# Patient Record
Sex: Male | Born: 1940
Health system: Southern US, Community
[De-identification: ages and names within clinical notes are randomized; demographics above are authoritative.]

## PROBLEM LIST (undated history)

## (undated) DIAGNOSIS — C61 Malignant neoplasm of prostate: Secondary | ICD-10-CM

## (undated) DIAGNOSIS — R221 Localized swelling, mass and lump, neck: Secondary | ICD-10-CM

## (undated) DIAGNOSIS — C801 Malignant (primary) neoplasm, unspecified: Secondary | ICD-10-CM

## (undated) DIAGNOSIS — Z923 Personal history of irradiation: Secondary | ICD-10-CM

## (undated) DIAGNOSIS — D649 Anemia, unspecified: Secondary | ICD-10-CM

## (undated) DIAGNOSIS — I729 Aneurysm of unspecified site: Secondary | ICD-10-CM

## (undated) DIAGNOSIS — J189 Pneumonia, unspecified organism: Secondary | ICD-10-CM

## (undated) DIAGNOSIS — N1330 Unspecified hydronephrosis: Secondary | ICD-10-CM

## (undated) DIAGNOSIS — R339 Retention of urine, unspecified: Secondary | ICD-10-CM

---

## 2015-07-05 ENCOUNTER — Ambulatory Visit (INDEPENDENT_AMBULATORY_CARE_PROVIDER_SITE_OTHER): Payer: Medicare HMO | Admitting: Family Medicine

## 2015-07-05 VITALS — BP 132/96 | HR 74 | Temp 97.7°F | Resp 16 | Wt 140.6 lb

## 2015-07-05 DIAGNOSIS — R634 Abnormal weight loss: Secondary | ICD-10-CM | POA: Diagnosis not present

## 2015-07-05 DIAGNOSIS — D649 Anemia, unspecified: Secondary | ICD-10-CM | POA: Diagnosis not present

## 2015-07-05 DIAGNOSIS — R630 Anorexia: Secondary | ICD-10-CM | POA: Diagnosis not present

## 2015-07-05 LAB — POCT URINALYSIS DIP (MANUAL ENTRY)
Bilirubin, UA: NEGATIVE
Glucose, UA: NEGATIVE
Ketones, POC UA: NEGATIVE
Leukocytes, UA: NEGATIVE
Nitrite, UA: NEGATIVE
Spec Grav, UA: <=1.005
Urobilinogen, UA: 0.2
pH, UA: 5.5

## 2015-07-05 LAB — POCT CBC
Granulocyte percent: 62.6 %G (ref 37–80)
HCT, POC: 24 % — AB (ref 43.5–53.7)
Hemoglobin: 8.4 g/dL — AB (ref 14.1–18.1)
Lymph, poc: 1.3 (ref 0.6–3.4)
MCH, POC: 28.7 pg (ref 27–31.2)
MCHC: 35 g/dL (ref 31.8–35.4)
MCV: 81.9 fL (ref 80–97)
MID (cbc): 0.2 (ref 0–0.9)
MPV: 6.1 fL (ref 0–99.8)
POC Granulocyte: 2.4 (ref 2–6.9)
POC LYMPH PERCENT: 32.4 %L (ref 10–50)
POC MID %: 5 %M (ref 0–12)
Platelet Count, POC: 218 10*3/uL (ref 142–424)
RBC: 2.93 M/uL — AB (ref 4.69–6.13)
RDW, POC: 14 %
WBC: 3.9 10*3/uL — AB (ref 4.6–10.2)

## 2015-07-05 LAB — GLUCOSE, POCT (MANUAL RESULT ENTRY): POC Glucose: 110 mg/dl — AB (ref 70–99)

## 2015-07-05 LAB — POCT GLYCOSYLATED HEMOGLOBIN (HGB A1C): Hemoglobin A1C: 6.3

## 2015-07-05 NOTE — Progress Notes (Addendum)
By signing my name below, I, Joel [name], attest that this documentation has been prepared under the direction and in the presence of Joel Haber, MD. Electronically Signed: [name], [place]. 07/05/2015 , 5:19 PM .  Patient was seen in room 14 .   Patient ID: Joel [name] MRN: [id number], DOB: [birth year], 75 y.o. Date of Encounter: 07/05/2015  Primary Physician: No primary care provider on file.  Chief Complaint:  Chief Complaint  Patient presents with  . Leg Swelling    both legs, on going for x 2 months  . loss of appeptite    HPI:  Joel [name] is a 75 y.o. male who presents to [facility] complaining of leg swelling bilaterally that started about 2 months ago. He states that the swelling goes from his ankle up to his knee. He also mentions loss of appetite due to abdominal discomfort. He notes frequent urination and recent weight loss. He denies taking any chronic medications. He denies cough, shortness of breath, vision changes or epistaxis. He's a former smoker, quit about 30 years ago.   He also reports possible hernia from lifting at the [employer].   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   History reviewed. No pertinent past medical history.   Home Meds: Prior to Admission medications   Not on File    Allergies: No Known Allergies  Social History   Social History  . Marital Status: Married    Spouse Name: N/A  . Number of Children: N/A  . Years of Education: N/A   Occupational History  . Not on file.   Social History Main Topics  . Smoking status: Never Smoker   . Smokeless tobacco: Not on file  . Alcohol Use: No  . Drug Use: Not on file  . Sexual Activity: Not on file   Other Topics Concern  . Not on file   Social History Narrative  . No narrative on file     Review of Systems:  Constitutional: negative for fever, chills, night sweats, or fatigue; positive for loss of appetite, weight change HEENT:  negative for vision changes, hearing loss, congestion, rhinorrhea, ST, epistaxis, or sinus pressure Cardiovascular: negative for chest pain or palpitations Respiratory: negative for hemoptysis, wheezing, shortness of breath, or cough Abdominal: negative for nausea, vomiting, diarrhea, or constipation; positive for abdominal discomfort Dermatological: negative for rash Musc: positive for leg swelling (bilaterally) GU: positive for frequent urination Neurologic: negative for headache, dizziness, or syncope All other systems reviewed and are otherwise negative with the exception to those above and in the HPI.  Physical Exam: [name] pressure 132/96, pulse 74, temperature 97.7 F (36.5 C), temperature source Oral, resp. rate 16, weight 140 lb 9.6 oz (63.776 kg), SpO2 97 %., There is no height on file to calculate BMI. General: Well developed in no acute distress; cachetic  Head: Normocephalic, atraumatic, eyes without discharge, sclera non-icteric, nares are without discharge. Bilateral auditory canals clear, TM's are without perforation, pearly grey and translucent with reflective cone of light bilaterally. Oral cavity moist, posterior pharynx without exudate, erythema, peritonsillar abscess, or post nasal drip. 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 No thyromegaly. Full ROM. No lymphadenopathy. Lungs: Clear bilaterally to auscultation without wheezes, rales, or rhonchi. Breathing is unlabored. Heart: RRR with S1 S2. No murmurs, rubs, or gallops appreciated. Abdomen: Soft, non-tender, non-distended with normoactive bowel sounds. No hepatomegaly. No rebound/guarding. No  obvious abdominal masses. No hernia, no palpable hepatomegaly, hard time relaxing his abdominal wall Msk:  Strength and tone normal for age. Extremities/Skin: Warm and dry. No clubbing or cyanosis. No edema. No rashes or suspicious lesions. 2+ pedal edema with scaling and  hyperpigmentation Neuro: Alert and oriented X 3. Moves all extremities spontaneously. Gait is normal. CNII-XII grossly in tact. Psych:  Responds to questions appropriately with a normal affect.   Labs: Results for orders placed or performed in visit on 07/05/15  POCT CBC  Result Value Ref Range   WBC 3.9 (A) 4.6 - 10.2 K/uL   Lymph, poc 1.3 0.6 - 3.4   POC LYMPH PERCENT 32.4 10 - 50 %L   MID (cbc) 0.2 0 - 0.9   POC MID % 5.0 0 - 12 %M   POC Granulocyte 2.4 2 - 6.9   Granulocyte percent 62.6 37 - 80 %G   RBC 2.93 (A) 4.69 - 6.13 M/uL   Hemoglobin 8.4 (A) 14.1 - 18.1 g/dL   HCT, POC 24.0 (A) 43.5 - 53.7 %   MCV 81.9 80 - 97 fL   MCH, POC 28.7 27 - 31.2 pg   MCHC 35.0 31.8 - 35.4 g/dL   RDW, POC 14.0 %   Platelet Count, POC 218 142 - 424 K/uL   MPV 6.1 0 - 99.8 fL  POCT urinalysis dipstick  Result Value Ref Range   Color, UA yellow yellow   Clarity, UA clear clear   Glucose, UA negative negative   Bilirubin, UA negative negative   Ketones, POC UA negative negative   Spec Grav, UA <=1.005    [name], UA small (A) negative   pH, UA 5.5    Protein Ur, POC trace (A) negative   Urobilinogen, UA 0.2    Nitrite, UA Negative Negative   Leukocytes, UA Negative Negative  POCT glycosylated hemoglobin (Hb A1C)  Result Value Ref Range   Hemoglobin A1C 6.3   POCT glucose (manual entry)  Result Value Ref Range   POC Glucose 110 (A) 70 - 99 mg/dl     ASSESSMENT AND PLAN:  75 y.o. year old male with  This chart was scribed in my presence and reviewed by me personally.    ICD-9-CM ICD-10-CM   1. Loss of weight 783.21 R63.4 POCT CBC     POCT urinalysis dipstick     COMPLETE METABOLIC PANEL WITH GFR     POCT glycosylated hemoglobin (Hb A1C)     POCT glucose (manual entry)     T4, free     Ambulatory referral to Gastroenterology     CT Abdomen Pelvis W Contrast  2. Anorexia 783.0 R63.0 POCT CBC     POCT urinalysis dipstick     COMPLETE METABOLIC PANEL WITH GFR     POCT  glycosylated hemoglobin (Hb A1C)     POCT glucose (manual entry)     T4, free     Ambulatory referral to Gastroenterology     CT Abdomen Pelvis W Contrast  3. Anemia, unspecified anemia type 285.9 D64.9 Ambulatory referral to Gastroenterology     CT Abdomen Pelvis W Contrast     Recheck one week  Signed, Joel Haber, MD 07/05/2015 6:27 PM

## 2015-07-05 NOTE — Patient Instructions (Signed)
You do not have diabetes. You do have a significant anemia. For this I will you to start multiple vitamins every day and take 1 can of ensure daily. I'm ordering a CAT scan of your abdomen to see if there is a cause in the abdominal cavity for this change in your health.  I'm also referring her to a gastroenterologist for further evaluation. There is some tests that are still outstanding and I'm waiting for the results.  I would like you to return in one week so we can review the results of all the tests.

## 2015-07-06 LAB — COMPLETE METABOLIC PANEL WITH GFR
ALT: 18 U/L (ref 9–46)
AST: 21 U/L (ref 10–35)
Albumin: 4.2 g/dL (ref 3.6–5.1)
Alkaline Phosphatase: 46 U/L (ref 40–115)
BUN: 113 mg/dL — ABNORMAL HIGH (ref 7–25)
CO2: 15 mmol/L — ABNORMAL LOW (ref 20–31)
Calcium: 9.2 mg/dL (ref 8.6–10.3)
Chloride: 110 mmol/L (ref 98–110)
Creat: 7.94 mg/dL — ABNORMAL HIGH (ref 0.70–1.18)
GFR, Est African American: 7 mL/min — ABNORMAL LOW (ref >=60)
GFR, Est Non African American: 6 mL/min — ABNORMAL LOW (ref >=60)
Glucose, Bld: 99 mg/dL (ref 65–99)
Potassium: 5.6 mmol/L — ABNORMAL HIGH (ref 3.5–5.3)
Sodium: 139 mmol/L (ref 135–146)
Total Bilirubin: 0.5 mg/dL (ref 0.2–1.2)
Total Protein: 7.7 g/dL (ref 6.1–8.1)

## 2015-07-06 LAB — T4, FREE: Free T4: 0.9 ng/dL (ref 0.8–1.8)

## 2015-07-07 ENCOUNTER — Inpatient Hospital Stay (HOSPITAL_COMMUNITY)
Admission: EM | Admit: 2015-07-07 | Discharge: 2015-07-13 | DRG: 682 | Disposition: A | Discharge status: Home / Self Care | Payer: Medicare HMO | Attending: Internal Medicine | Admitting: Internal Medicine

## 2015-07-07 ENCOUNTER — Emergency Department (HOSPITAL_COMMUNITY): Payer: Medicare HMO

## 2015-07-07 ENCOUNTER — Encounter (HOSPITAL_COMMUNITY): Payer: Self-pay | Admitting: Emergency Medicine

## 2015-07-07 DIAGNOSIS — N19 Unspecified kidney failure: Secondary | ICD-10-CM

## 2015-07-07 DIAGNOSIS — N138 Other obstructive and reflux uropathy: Secondary | ICD-10-CM | POA: Diagnosis present

## 2015-07-07 DIAGNOSIS — Z801 Family history of malignant neoplasm of trachea, bronchus and lung: Secondary | ICD-10-CM

## 2015-07-07 DIAGNOSIS — N189 Chronic kidney disease, unspecified: Secondary | ICD-10-CM | POA: Diagnosis present

## 2015-07-07 DIAGNOSIS — N179 Acute kidney failure, unspecified: Principal | ICD-10-CM | POA: Diagnosis present

## 2015-07-07 DIAGNOSIS — R972 Elevated prostate specific antigen [PSA]: Secondary | ICD-10-CM | POA: Diagnosis present

## 2015-07-07 DIAGNOSIS — R030 Elevated blood-pressure reading, without diagnosis of hypertension: Secondary | ICD-10-CM

## 2015-07-07 DIAGNOSIS — N1330 Unspecified hydronephrosis: Secondary | ICD-10-CM | POA: Diagnosis present

## 2015-07-07 DIAGNOSIS — R197 Diarrhea, unspecified: Secondary | ICD-10-CM | POA: Diagnosis present

## 2015-07-07 DIAGNOSIS — E872 Acidosis: Secondary | ICD-10-CM | POA: Diagnosis present

## 2015-07-07 DIAGNOSIS — E869 Volume depletion, unspecified: Secondary | ICD-10-CM | POA: Diagnosis not present

## 2015-07-07 DIAGNOSIS — I9589 Other hypotension: Secondary | ICD-10-CM | POA: Diagnosis not present

## 2015-07-07 DIAGNOSIS — I129 Hypertensive chronic kidney disease with stage 1 through stage 4 chronic kidney disease, or unspecified chronic kidney disease: Secondary | ICD-10-CM | POA: Diagnosis present

## 2015-07-07 DIAGNOSIS — R600 Localized edema: Secondary | ICD-10-CM | POA: Diagnosis present

## 2015-07-07 DIAGNOSIS — IMO0001 Reserved for inherently not codable concepts without codable children: Secondary | ICD-10-CM | POA: Diagnosis present

## 2015-07-07 DIAGNOSIS — D649 Anemia, unspecified: Secondary | ICD-10-CM | POA: Diagnosis present

## 2015-07-07 DIAGNOSIS — D638 Anemia in other chronic diseases classified elsewhere: Secondary | ICD-10-CM | POA: Diagnosis present

## 2015-07-07 DIAGNOSIS — Z8249 Family history of ischemic heart disease and other diseases of the circulatory system: Secondary | ICD-10-CM

## 2015-07-07 DIAGNOSIS — N401 Enlarged prostate with lower urinary tract symptoms: Secondary | ICD-10-CM | POA: Diagnosis present

## 2015-07-07 DIAGNOSIS — N320 Bladder-neck obstruction: Secondary | ICD-10-CM | POA: Diagnosis present

## 2015-07-07 DIAGNOSIS — Z681 Body mass index (BMI) 19 or less, adult: Secondary | ICD-10-CM

## 2015-07-07 DIAGNOSIS — E875 Hyperkalemia: Secondary | ICD-10-CM | POA: Diagnosis present

## 2015-07-07 DIAGNOSIS — E43 Unspecified severe protein-calorie malnutrition: Secondary | ICD-10-CM | POA: Diagnosis present

## 2015-07-07 LAB — URINALYSIS, ROUTINE W REFLEX MICROSCOPIC
Bilirubin Urine: NEGATIVE
GLUCOSE, UA: NEGATIVE mg/dL
Ketones, ur: NEGATIVE mg/dL
LEUKOCYTES UA: NEGATIVE
Nitrite: NEGATIVE
PH: 5.5 (ref 5.0–8.0)
PROTEIN: NEGATIVE mg/dL
Specific Gravity, Urine: 1.01 (ref 1.005–1.030)

## 2015-07-07 LAB — CBC WITH DIFFERENTIAL/PLATELET
BASOS PCT: 0 %
Basophils Absolute: 0 10*3/uL (ref 0.0–0.1)
EOS PCT: 0 %
Eosinophils Absolute: 0 10*3/uL (ref 0.0–0.7)
HCT: 23.4 % — ABNORMAL LOW (ref 39.0–52.0)
Hemoglobin: 8.1 g/dL — ABNORMAL LOW (ref 13.0–17.0)
LYMPHS ABS: 1.2 10*3/uL (ref 0.7–4.0)
Lymphocytes Relative: 28 %
MCH: 27.9 pg (ref 26.0–34.0)
MCHC: 34.6 g/dL (ref 30.0–36.0)
MCV: 80.7 fL (ref 78.0–100.0)
MONOS PCT: 6 %
Monocytes Absolute: 0.3 10*3/uL (ref 0.1–1.0)
NEUTROS ABS: 2.8 10*3/uL (ref 1.7–7.7)
Neutrophils Relative %: 66 %
PLATELETS: 211 10*3/uL (ref 150–400)
RBC: 2.9 MIL/uL — ABNORMAL LOW (ref 4.22–5.81)
RDW: 14.2 % (ref 11.5–15.5)
WBC: 4.3 10*3/uL (ref 4.0–10.5)

## 2015-07-07 LAB — COMPREHENSIVE METABOLIC PANEL
ALT: 24 U/L (ref 17–63)
AST: 30 U/L (ref 15–41)
Albumin: 4.2 g/dL (ref 3.5–5.0)
Alkaline Phosphatase: 47 U/L (ref 38–126)
Anion gap: 15 (ref 5–15)
BUN: 132 mg/dL — ABNORMAL HIGH (ref 6–20)
CHLORIDE: 112 mmol/L — AB (ref 101–111)
CO2: 17 mmol/L — ABNORMAL LOW (ref 22–32)
Calcium: 9.8 mg/dL (ref 8.9–10.3)
Creatinine, Ser: 8.17 mg/dL — ABNORMAL HIGH (ref 0.61–1.24)
GFR, EST AFRICAN AMERICAN: 7 mL/min — AB (ref >60)
GFR, EST NON AFRICAN AMERICAN: 6 mL/min — AB (ref >60)
Glucose, Bld: 133 mg/dL — ABNORMAL HIGH (ref 65–99)
POTASSIUM: 6.5 mmol/L — AB (ref 3.5–5.1)
Sodium: 144 mmol/L (ref 135–145)
TOTAL PROTEIN: 7.8 g/dL (ref 6.5–8.1)
Total Bilirubin: 0.4 mg/dL (ref 0.3–1.2)

## 2015-07-07 LAB — URINE MICROSCOPIC-ADD ON

## 2015-07-07 LAB — MAGNESIUM: Magnesium: 2 mg/dL (ref 1.7–2.4)

## 2015-07-07 MED ORDER — SODIUM POLYSTYRENE SULFONATE 15 GM/60ML PO SUSP: 15.0000 g | Freq: Once | ORAL | Status: DC

## 2015-07-07 MED ORDER — DEXTROSE 50 % IV SOLN
1.0000 | Freq: Once | INTRAVENOUS | Status: AC
  Administered 2015-07-08: 50 mL via INTRAVENOUS
  Filled 2015-07-07: qty 50

## 2015-07-07 MED ORDER — SODIUM CHLORIDE 0.9 % IV BOLUS (SEPSIS)
500.0000 mL | Freq: Once | INTRAVENOUS | Status: AC
  Administered 2015-07-08: 500 mL via INTRAVENOUS

## 2015-07-07 MED ORDER — INSULIN ASPART 100 UNIT/ML IV SOLN
10.0000 [IU] | Freq: Once | INTRAVENOUS | Status: AC
  Administered 2015-07-08: 10 [IU] via INTRAVENOUS
  Filled 2015-07-07: qty 1

## 2015-07-07 NOTE — ED Provider Notes (Signed)
CSN: [id number]     Arrival date & time 07/07/15  1853 History   First MD Initiated Contact with Patient 07/07/15 2159     Chief Complaint  Patient presents with  . Abnormal Lab     (Consider location/radiation/quality/duration/timing/severity/associated sxs/prior Treatment) HPI Comments: Mr. Somma is a very active 75yo with no PMHx who presents today after his PCP sent him after abnormal kidney labs. He had not seen a doctor or had blood tests in many years. Patient has been having frequency and urgency over the past couple months. No gross hematuria. Patient reports he has had increased thirst and fluid intake. Patient reports over a 20lb weight loss over the past few months. He reports increasing fatigue and loss of appetite. He began drinking Ensure on Monday. His family reports that his voice has started to sound "weaker" over the past week. Patient states he has had an occasional lower abdominal pain that occurs multiple times per day, but not daily. Patient had 1 episode of bilious vomiting 2 weeks ago. Patient reports he has noticed his legs starting to swell over the past few months. He stated his legs started itching before they started to swell. Patient denies chest pain, shortness of breath, headache.  The history is provided by the patient, the spouse and a relative.    History reviewed. No pertinent past medical history. History reviewed. No pertinent past surgical history. No family history on file. Social History  Substance Use Topics  . Smoking status: Never Smoker   . Smokeless tobacco: None  . Alcohol Use: No    Review of Systems  Constitutional: Positive for appetite change and unexpected weight change. Negative for fever, chills and activity change.  HENT: Negative for facial swelling.   Respiratory: Negative for shortness of breath.   Cardiovascular: Negative for chest pain.  Gastrointestinal: Positive for abdominal pain (occasional lower abdominal pain). Negative  for nausea and vomiting.  Endocrine: Positive for polydipsia.  Genitourinary: Positive for urgency and frequency. Negative for dysuria.  Musculoskeletal: Negative for back pain.  Skin: Negative for rash and wound.  Neurological: Negative for headaches.  Psychiatric/Behavioral: The patient is not nervous/anxious.       Allergies  Review of patient's allergies indicates no known allergies.  Home Medications   Prior to Admission medications   Not on File   BP 180/102 mmHg  Pulse 79  Temp(Src) 98.5 F (36.9 C) (Oral)  Resp 18  Ht 5\' 7"  (1.702 m)  Wt 62.596 kg  BMI 21.61 kg/m2  SpO2 100% Physical Exam  Constitutional: He appears well-developed and well-nourished. No distress.  HENT:  Head: Normocephalic and atraumatic.  Mouth/Throat: Oropharynx is clear and moist. No oropharyngeal exudate.  Eyes: Conjunctivae are normal. Pupils are equal, round, and reactive to light. Right eye exhibits no discharge. Left eye exhibits no discharge. No scleral icterus.  Neck: Normal range of motion. Neck supple. No thyromegaly present.  Cardiovascular: Normal rate, regular rhythm and normal heart sounds.  Exam reveals no gallop and no friction rub.   No murmur heard. Pulmonary/Chest: Effort normal and breath sounds normal. No stridor. No respiratory distress. He has no wheezes. He has no rales.  Abdominal: Soft. Bowel sounds are normal. He exhibits no distension. There is no tenderness. There is no rebound and no guarding.  Musculoskeletal: He exhibits edema (2+ pitting). He exhibits no tenderness.  Lymphadenopathy:    He has no cervical adenopathy.  Neurological: He is alert. He has normal strength. He is not disoriented.  No cranial nerve deficit or sensory deficit. He displays a negative Romberg sign. Coordination and gait normal.  Reflex Scores:      Patellar reflexes are 2+ on the right side and 2+ on the left side. Skin: Skin is warm and dry. No rash noted. He is not diaphoretic. No  pallor.  Psychiatric: He has a normal mood and affect.  Nursing note and vitals reviewed.   ED Course  Procedures (including critical care time) Labs Review Labs Reviewed  CBC WITH DIFFERENTIAL/PLATELET - Abnormal; Notable for the following:    RBC 2.90 (*)    Hemoglobin 8.1 (*)    HCT 23.4 (*)    All other components within normal limits  COMPREHENSIVE METABOLIC PANEL - Abnormal; Notable for the following:    Potassium 6.5 (*)    Chloride 112 (*)    CO2 17 (*)    Glucose, Bld 133 (*)    BUN 132 (*)    Creatinine, Ser 8.17 (*)    GFR calc non Af Amer 6 (*)    GFR calc Af Amer 7 (*)    All other components within normal limits  URINALYSIS, ROUTINE W REFLEX MICROSCOPIC (NOT AT [facility]) - Abnormal; Notable for the following:    Hgb urine dipstick SMALL (*)    All other components within normal limits  URINE MICROSCOPIC-ADD ON - Abnormal; Notable for the following:    Squamous Epithelial / LPF 0-5 (*)    Bacteria, UA RARE (*)    All other components within normal limits  MAGNESIUM    Imaging Review Dg Chest 2 View  07/07/2015  CLINICAL DATA:  Acute onset of abnormal blood tests. Initial encounter. EXAM: CHEST  2 VIEW COMPARISON:  None. FINDINGS: The lungs are well-aerated and clear. There is no evidence of focal opacification, pleural effusion or pneumothorax. The heart is normal in size; the mediastinal contour is within normal limits. No acute osseous abnormalities are seen. IMPRESSION: No acute cardiopulmonary process seen. Electronically Signed   By: Garald Balding M.D.   On: 07/07/2015 23:01   I have personally reviewed and evaluated these images and lab results as part of my medical decision-making.   EKG Interpretation   Date/Time:  Thursday July 07 2015 21:08:01 EDT Ventricular Rate:  77 PR Interval:  188 QRS Duration: 88 QT Interval:  390 QTC Calculation: 441 R Axis:   44 Text Interpretation:  Normal sinus rhythm Normal ECG No old tracing to  compare Confirmed by  KNAPP  MD-J, JON [id number]) on 07/07/2015 10:48:13 PM      MDM   Mr. Nicklaus is a very active 75yo with no PMHx who presents today after his PCP sent him after abnormal kidney labs. He had not seen a doctor or had blood tests in many years. Patient has been having frequency and urgency on urination, polyphagia, weight loss, leg swelling and fatigue over the past couple months. BUN 132. Cr 8.17. K 6.5. CO2 17. CXR shows no pleural effusion, or other acute cardiopulmonary process. EKG NSR. Patient treated for hyperkalemia in ED. Dr. Tomi Bamberger spoke with nephrology who would like to admit him to medicine. Dr. Blaine Hamper with internal medicine agrees to admit the patient. Dr. Tomi Bamberger also evaluated the patient and is in agreement with plan.  Final diagnoses:  Renal failure      Frederica Kuster, PA-C 07/08/15 0037  Dorie Rank, MD 07/08/15 [phone]

## 2015-07-07 NOTE — ED Notes (Signed)
Pt ambulated to bathroom by self.

## 2015-07-07 NOTE — ED Notes (Signed)
EDP ( Dr. Hillard Danker ) / Charge nurse Raquel Sarna Bivens ) notified on elevated Kcl = 6.5 , will move to next available bed .

## 2015-07-07 NOTE — ED Notes (Signed)
Pt. received a call today from MD office advised him to go to ER due to abnormal blood tests results taken this week , pt. Stated his kidney test is abnormal , denies any pain / respirations unlabored.

## 2015-07-07 NOTE — ED Provider Notes (Signed)
Patient presents to the emergency room for evaluation of renal failure.  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 He is not coughing having any blood tests and a long period of time. Over the last several months the patient's had decreased appetite and weight loss. He went to a primary doctor's office a couple days ago. He was notified today that his kidney function test were very abnormal in that he should come to the emergency room.  On exam the patient appears underweight no acute distress Heart: Regular rate and rhythm Lungs: Clear to auscultation Extremities: Peripheral edema  Patient's laboratory tests show chronic renal failure associated with hypokalemia and a metabolic acidosis. Patient does not have any EKG changes.  Consult with Dr. Moshe Cipro. Because of the patient's decreased bicarbonate and elevated potassium level plan is for admission to the hospital for stabilization and expedited nephrology evaluation  i will consult with the medical service for admission.  Dorie Rank, MD 07/07/15 [phone]

## 2015-07-08 ENCOUNTER — Observation Stay (HOSPITAL_COMMUNITY): Payer: Medicare HMO

## 2015-07-08 ENCOUNTER — Encounter (HOSPITAL_COMMUNITY): Payer: Self-pay | Admitting: Internal Medicine

## 2015-07-08 DIAGNOSIS — N320 Bladder-neck obstruction: Secondary | ICD-10-CM | POA: Diagnosis present

## 2015-07-08 DIAGNOSIS — R609 Edema, unspecified: Secondary | ICD-10-CM | POA: Diagnosis not present

## 2015-07-08 DIAGNOSIS — N138 Other obstructive and reflux uropathy: Secondary | ICD-10-CM | POA: Diagnosis present

## 2015-07-08 DIAGNOSIS — R197 Diarrhea, unspecified: Secondary | ICD-10-CM | POA: Diagnosis present

## 2015-07-08 DIAGNOSIS — R030 Elevated blood-pressure reading, without diagnosis of hypertension: Secondary | ICD-10-CM | POA: Diagnosis not present

## 2015-07-08 DIAGNOSIS — N189 Chronic kidney disease, unspecified: Secondary | ICD-10-CM | POA: Diagnosis present

## 2015-07-08 DIAGNOSIS — D649 Anemia, unspecified: Secondary | ICD-10-CM | POA: Diagnosis not present

## 2015-07-08 DIAGNOSIS — R600 Localized edema: Secondary | ICD-10-CM | POA: Diagnosis not present

## 2015-07-08 DIAGNOSIS — E43 Unspecified severe protein-calorie malnutrition: Secondary | ICD-10-CM | POA: Diagnosis present

## 2015-07-08 DIAGNOSIS — N401 Enlarged prostate with lower urinary tract symptoms: Secondary | ICD-10-CM | POA: Diagnosis present

## 2015-07-08 DIAGNOSIS — N19 Unspecified kidney failure: Secondary | ICD-10-CM | POA: Diagnosis present

## 2015-07-08 DIAGNOSIS — I129 Hypertensive chronic kidney disease with stage 1 through stage 4 chronic kidney disease, or unspecified chronic kidney disease: Secondary | ICD-10-CM | POA: Diagnosis present

## 2015-07-08 DIAGNOSIS — N1330 Unspecified hydronephrosis: Secondary | ICD-10-CM | POA: Diagnosis present

## 2015-07-08 DIAGNOSIS — IMO0001 Reserved for inherently not codable concepts without codable children: Secondary | ICD-10-CM | POA: Diagnosis present

## 2015-07-08 DIAGNOSIS — D638 Anemia in other chronic diseases classified elsewhere: Secondary | ICD-10-CM | POA: Diagnosis present

## 2015-07-08 DIAGNOSIS — E872 Acidosis: Secondary | ICD-10-CM | POA: Diagnosis present

## 2015-07-08 DIAGNOSIS — Z681 Body mass index (BMI) 19 or less, adult: Secondary | ICD-10-CM | POA: Diagnosis not present

## 2015-07-08 DIAGNOSIS — E875 Hyperkalemia: Secondary | ICD-10-CM | POA: Diagnosis present

## 2015-07-08 DIAGNOSIS — R972 Elevated prostate specific antigen [PSA]: Secondary | ICD-10-CM | POA: Diagnosis present

## 2015-07-08 DIAGNOSIS — E869 Volume depletion, unspecified: Secondary | ICD-10-CM | POA: Diagnosis not present

## 2015-07-08 DIAGNOSIS — Z801 Family history of malignant neoplasm of trachea, bronchus and lung: Secondary | ICD-10-CM | POA: Diagnosis not present

## 2015-07-08 DIAGNOSIS — Z8249 Family history of ischemic heart disease and other diseases of the circulatory system: Secondary | ICD-10-CM | POA: Diagnosis not present

## 2015-07-08 DIAGNOSIS — N179 Acute kidney failure, unspecified: Secondary | ICD-10-CM | POA: Diagnosis present

## 2015-07-08 DIAGNOSIS — I9589 Other hypotension: Secondary | ICD-10-CM | POA: Diagnosis not present

## 2015-07-08 LAB — BASIC METABOLIC PANEL
Anion gap: 14 (ref 5–15)
Anion gap: 14 (ref 5–15)
BUN: 134 mg/dL — ABNORMAL HIGH (ref 6–20)
BUN: 119 mg/dL — AB (ref 6–20)
CALCIUM: 9.3 mg/dL (ref 8.9–10.3)
CHLORIDE: 114 mmol/L — AB (ref 101–111)
CO2: 14 mmol/L — ABNORMAL LOW (ref 22–32)
CO2: 18 mmol/L — ABNORMAL LOW (ref 22–32)
CREATININE: 8.06 mg/dL — AB (ref 0.61–1.24)
CREATININE: 6.87 mg/dL — AB (ref 0.61–1.24)
Calcium: 8.8 mg/dL — ABNORMAL LOW (ref 8.9–10.3)
Chloride: 113 mmol/L — ABNORMAL HIGH (ref 101–111)
GFR calc non Af Amer: 6 mL/min — ABNORMAL LOW (ref >60)
GFR, EST AFRICAN AMERICAN: 7 mL/min — AB (ref >60)
GFR, EST AFRICAN AMERICAN: 8 mL/min — AB (ref >60)
GFR, EST NON AFRICAN AMERICAN: 7 mL/min — AB (ref >60)
Glucose, Bld: 242 mg/dL — ABNORMAL HIGH (ref 65–99)
Glucose, Bld: 124 mg/dL — ABNORMAL HIGH (ref 65–99)
POTASSIUM: 4.8 mmol/L (ref 3.5–5.1)
Potassium: 6.2 mmol/L (ref 3.5–5.1)
SODIUM: 141 mmol/L (ref 135–145)
SODIUM: 146 mmol/L — AB (ref 135–145)

## 2015-07-08 LAB — RAPID URINE DRUG SCREEN, HOSP PERFORMED
AMPHETAMINES: NOT DETECTED
BENZODIAZEPINES: NOT DETECTED
Barbiturates: NOT DETECTED
Cocaine: NOT DETECTED
Opiates: NOT DETECTED
TETRAHYDROCANNABINOL: NOT DETECTED

## 2015-07-08 LAB — RENAL FUNCTION PANEL
ALBUMIN: 3.9 g/dL (ref 3.5–5.0)
Anion gap: 17 — ABNORMAL HIGH (ref 5–15)
BUN: 130 mg/dL — AB (ref 6–20)
CHLORIDE: 114 mmol/L — AB (ref 101–111)
CO2: 13 mmol/L — ABNORMAL LOW (ref 22–32)
Calcium: 9.6 mg/dL (ref 8.9–10.3)
Creatinine, Ser: 7.67 mg/dL — ABNORMAL HIGH (ref 0.61–1.24)
GFR, EST AFRICAN AMERICAN: 7 mL/min — AB (ref >60)
GFR, EST NON AFRICAN AMERICAN: 6 mL/min — AB (ref >60)
Glucose, Bld: 66 mg/dL (ref 65–99)
PHOSPHORUS: 6 mg/dL — AB (ref 2.5–4.6)
POTASSIUM: 5.4 mmol/L — AB (ref 3.5–5.1)
Sodium: 144 mmol/L (ref 135–145)

## 2015-07-08 LAB — LIPID PANEL
Cholesterol: 162 mg/dL (ref 0–200)
HDL: 76 mg/dL (ref >40)
LDL CALC: 78 mg/dL (ref 0–99)
TRIGLYCERIDES: 40 mg/dL (ref <150)
Total CHOL/HDL Ratio: 2.1 RATIO
VLDL: 8 mg/dL (ref 0–40)

## 2015-07-08 LAB — CBC
HEMATOCRIT: 21.9 % — AB (ref 39.0–52.0)
HEMOGLOBIN: 7.2 g/dL — AB (ref 13.0–17.0)
MCH: 26.6 pg (ref 26.0–34.0)
MCHC: 32.9 g/dL (ref 30.0–36.0)
MCV: 80.8 fL (ref 78.0–100.0)
Platelets: 195 10*3/uL (ref 150–400)
RBC: 2.71 MIL/uL — AB (ref 4.22–5.81)
RDW: 14.2 % (ref 11.5–15.5)
WBC: 3.7 10*3/uL — AB (ref 4.0–10.5)

## 2015-07-08 LAB — PROTIME-INR
INR: 1.38 (ref 0.00–1.49)
PROTHROMBIN TIME: 17.1 s — AB (ref 11.6–15.2)

## 2015-07-08 LAB — CREATININE, URINE, RANDOM: Creatinine, Urine: 60.45 mg/dL

## 2015-07-08 LAB — C DIFFICILE QUICK SCREEN W PCR REFLEX
C DIFFICILE (CDIFF) INTERP: NEGATIVE
C DIFFICILE (CDIFF) TOXIN: NEGATIVE
C DIFFICLE (CDIFF) ANTIGEN: NEGATIVE

## 2015-07-08 LAB — BRAIN NATRIURETIC PEPTIDE: B NATRIURETIC PEPTIDE 5: 104 pg/mL — AB (ref 0.0–100.0)

## 2015-07-08 LAB — SODIUM, URINE, RANDOM: Sodium, Ur: 65 mmol/L

## 2015-07-08 LAB — APTT: aPTT: 31 seconds (ref 24–37)

## 2015-07-08 LAB — CK: Total CK: 482 U/L — ABNORMAL HIGH (ref 49–397)

## 2015-07-08 LAB — PSA: PSA: 95.57 ng/mL — ABNORMAL HIGH (ref 0.00–4.00)

## 2015-07-08 MED ORDER — TAMSULOSIN HCL 0.4 MG PO CAPS
0.4000 mg | ORAL_CAPSULE | Freq: Every day | ORAL | Status: DC
  Administered 2015-07-08 – 2015-07-13 (×6): 0.4 mg via ORAL
  Filled 2015-07-08 (×6): qty 1

## 2015-07-08 MED ORDER — ONDANSETRON HCL 4 MG PO TABS: 4.0000 mg | ORAL_TABLET | Freq: Four times a day (QID) | ORAL | Status: DC | PRN

## 2015-07-08 MED ORDER — HYDRALAZINE HCL 20 MG/ML IJ SOLN
5.0000 mg | INTRAMUSCULAR | Status: DC | PRN
Start: 2015-07-08 — End: 2015-07-13
  Administered 2015-07-08 (×2): 5 mg via INTRAVENOUS
  Filled 2015-07-08 (×3): qty 1

## 2015-07-08 MED ORDER — ONDANSETRON HCL 4 MG/2ML IJ SOLN: 4.0000 mg | Freq: Four times a day (QID) | INTRAMUSCULAR | Status: DC | PRN

## 2015-07-08 MED ORDER — HEPARIN SODIUM (PORCINE) 5000 UNIT/ML IJ SOLN
5000.0000 [IU] | Freq: Three times a day (TID) | INTRAMUSCULAR | Status: DC
  Administered 2015-07-08 – 2015-07-12 (×14): 5000 [IU] via SUBCUTANEOUS
  Filled 2015-07-08 (×14): qty 1

## 2015-07-08 MED ORDER — ACETAMINOPHEN 325 MG PO TABS: 650.0000 mg | ORAL_TABLET | Freq: Four times a day (QID) | ORAL | Status: DC | PRN

## 2015-07-08 MED ORDER — SODIUM CHLORIDE 0.9% FLUSH
3.0000 mL | Freq: Two times a day (BID) | INTRAVENOUS | Status: DC
  Administered 2015-07-08 – 2015-07-13 (×7): 3 mL via INTRAVENOUS

## 2015-07-08 MED ORDER — ZOLPIDEM TARTRATE 5 MG PO TABS: 5.0000 mg | ORAL_TABLET | Freq: Every evening | ORAL | Status: DC | PRN

## 2015-07-08 MED ORDER — AMLODIPINE BESYLATE 10 MG PO TABS
10.0000 mg | ORAL_TABLET | Freq: Every day | ORAL | Status: DC
  Administered 2015-07-08 – 2015-07-11 (×4): 10 mg via ORAL
  Filled 2015-07-08: qty 1
  Filled 2015-07-08: qty 2
  Filled 2015-07-08 (×2): qty 1

## 2015-07-08 MED ORDER — SODIUM CHLORIDE 0.9 % IV SOLN
INTRAVENOUS | Status: DC
  Administered 2015-07-08 (×2): via INTRAVENOUS
  Administered 2015-07-09: 125 mL/h via INTRAVENOUS

## 2015-07-08 MED ORDER — NEPRO/CARBSTEADY PO LIQD
237.0000 mL | Freq: Three times a day (TID) | ORAL | Status: DC
  Administered 2015-07-08 – 2015-07-13 (×13): 237 mL via ORAL

## 2015-07-08 MED ORDER — ACETAMINOPHEN 650 MG RE SUPP: 650.0000 mg | Freq: Four times a day (QID) | RECTAL | Status: DC | PRN

## 2015-07-08 MED ORDER — SODIUM POLYSTYRENE SULFONATE 15 GM/60ML PO SUSP
30.0000 g | Freq: Once | ORAL | Status: AC
  Administered 2015-07-08: 30 g via ORAL
  Filled 2015-07-08: qty 120

## 2015-07-08 NOTE — ED Notes (Signed)
Renal Diet Tray ordered @ [phone]

## 2015-07-08 NOTE — ED Notes (Signed)
Provided cup of ice water, per MD.

## 2015-07-08 NOTE — Progress Notes (Signed)
[facility] PROGRESS NOTE  Joel [name] [id number] DOB: 12-Jun-1940 DOA: 07/07/2015 PCP: No primary care provider on file.  HPI/Brief narrative 75 y.o. male without significant PMH, not seen by doctor for 20 years, who presents with generalized weakness, body weight loss, diarrhea and bilateral leg edema. In the ED, patient was found to have acute renal injury with creatinine 8.17 and BUN 132, anemia with hemoglobin 8.1 and MCV 80.7, potassium 6.5 without EKG changes, bicarbonate 17  Assessment/Plan: AKI (acute kidney injury) ([place]): US showed prostatomegaly with bladder outlet obstruction causing bilateral hydronephrosis with bladder debris. -Seems to be improving following placement of Foley cath  -Started Flomax on admit -PSA is elevated at just under 100, worrisome for prostate ca  Prostatomegaly: as shown on US-renal. -Will ultimately need f/u with urology -Flomax was started in ED -Not complaining of back pain  Normocytic anemia: Hemoglobin 8.1 on admission. Patient never had colonoscopy or EGD  -Pt reportedly already scheduled for outpatient colonoscopy  Hyperkalemia: Potassium 6.5 without EKG change. This is most likely due to AKI -Treated with insulin and Kayexalate 30 g 1 by EDP  -Improved  Elevated blood pressure and possible and diagnosed hypertension: -Started amlodipine 10 mg daily -IV hydralazine PRN -BP improved  Diarrhea: unclear etiology. -Check C. difficile PCR and GI pathogen panel -IV fluids as above  Protein-calorie malnutrition, severe ([place]): lost 20 LBs over 3 month.  -Consult to nutrition team  Bilateral leg edema: Unclear etiology, differential diagnosis includes renal failure and CHF 2/2 uncontroled HTN. Liver Fx OK. -Improved after foley placed  DVT ppx: heparin subQ  Code Status: Full Family Communication: Pt in room Disposition Plan: Uncertain at this  time   Consultants:  nephrology  Procedures:    Antibiotics: Anti-infectives    None      HPI/Subjective: Feels better today. No complaints  Objective: Filed Vitals:   07/08/15 0745 07/08/15 1005 07/08/15 1009 07/08/15 1537  BP: 158/89  153/75 144/75  Pulse: 89  72 68  Temp:   97.6 F (36.4 C) 98.6 F (37 C)  TempSrc:   Oral   Resp: 12  16 16   Height:  5\' 7"  (1.702 m)    Weight:  59.421 kg (131 lb)    SpO2: 100%  100% 100%    Intake/Output Summary (Last 24 hours) at 07/08/15 1622 Last data filed at 07/08/15 1602  Gross per 24 hour  Intake 1046.58 ml  Output   9400 ml  Net -8353.42 ml   Filed Weights   07/07/15 1912 07/08/15 1005  Weight: 62.596 kg (138 lb) 59.421 kg (131 lb)    Exam:   General:  Awake, in nad  Cardiovascular: regular, s1, s2  Respiratory: normal resp effort, no wheezing  Abdomen: soft, nondistended  Musculoskeletal: perfused, no clubbing   Data Reviewed: Basic Metabolic Panel:  Recent Labs Lab 07/05/15 1745 07/07/15 1946 07/07/15 2306 07/08/15 0115 07/08/15 0440 07/08/15 1247  NA 139 144  --  141 144 146*  K 5.6* 6.5*  --  6.2* 5.4* 4.8  CL 110 112*  --  113* 114* 114*  CO2 15* 17*  --  14* 13* 18*  GLUCOSE 99 133*  --  242* 66 124*  BUN 113* 132*  --  134* 130* 119*  CREATININE 7.94* 8.17*  --  8.06* 7.67* 6.87*  CALCIUM 9.2 9.8  --  9.3 9.6 8.8*  MG  --   --  2.0  --   --   --   PHOS  --   --   --   --  6.0*  --    Liver Function Tests:  Recent Labs Lab 07/05/15 1745 07/07/15 1946 07/08/15 0440  AST 21 30  --   ALT 18 24  --   ALKPHOS 46 47  --   BILITOT 0.5 0.4  --   PROT 7.7 7.8  --   ALBUMIN 4.2 4.2 3.9   No results for input(s): LIPASE, AMYLASE in the last 168 hours. No results for input(s): AMMONIA in the last 168 hours. CBC:  Recent Labs Lab 07/05/15 1801 07/07/15 1946 07/08/15 0115  WBC 3.9* 4.3 3.7*  NEUTROABS  --  2.8  --   HGB 8.4* 8.1* 7.2*  HCT 24.0* 23.4* 21.9*  MCV 81.9 80.7  80.8  PLT  --  211 195   Cardiac Enzymes:  Recent Labs Lab 07/08/15 0106  CKTOTAL 482*   BNP (last 3 results)  Recent Labs  07/08/15 0106  BNP 104.0*    ProBNP (last 3 results) No results for input(s): PROBNP in the last 8760 hours.  CBG: No results for input(s): GLUCAP in the last 168 hours.  No results found for this or any previous visit (from the past 240 hour(s)).   Studies: Dg Chest 2 View  07/07/2015  CLINICAL DATA:  Acute onset of abnormal blood tests. Initial encounter. EXAM: CHEST  2 VIEW COMPARISON:  None. FINDINGS: The lungs are well-aerated and clear. There is no evidence of focal opacification, pleural effusion or pneumothorax. The heart is normal in size; the mediastinal contour is within normal limits. No acute osseous abnormalities are seen. IMPRESSION: No acute cardiopulmonary process seen. Electronically Signed   By: Garald Balding M.D.   On: 07/07/2015 23:01   US Renal  07/08/2015  CLINICAL DATA:  Acute kidney injury EXAM: RENAL / URINARY TRACT ULTRASOUND COMPLETE COMPARISON:  None. FINDINGS: Right Kidney: Length: 11 cm. Borderline echogenic. High-grade hydronephrosis and upper hydroureter. No evidence of mass lesion. Left Kidney: Length: 12 cm. Borderline echogenic. High-grade hydronephrosis and hydroureter. No evidence of mass lesion. Bladder: Overdistended bladder with 1100 cc volume. The patient could only urinate 150 cc. Enlarged prostate with nodular component projecting into the bladder. Floating debris in the bladder. These results were called by telephone at the time of interpretation on 07/08/2015 at 1:47 am to Dr. Ivor Costa , who verbally acknowledged these results. IMPRESSION: 1. Prostatomegaly with bladder outlet obstruction causing bilateral hydronephrosis. 2. Bladder debris, correlate with urinalysis. Electronically Signed   By: Monte Fantasia M.D.   On: 07/08/2015 01:47    Scheduled Meds: . amLODipine  10 mg Oral Daily  . feeding supplement  (NEPRO CARB STEADY)  237 mL Oral TID BM  . heparin  5,000 Units Subcutaneous 3 times per day  . sodium chloride flush  3 mL Intravenous Q12H  . tamsulosin  0.4 mg Oral Daily   Continuous Infusions: . sodium chloride 125 mL/hr at 07/08/15 1446    Principal Problem:   AKI (acute kidney injury) ([place]) Active Problems:   Normocytic anemia   Hyperkalemia   Elevated blood pressure   Renal failure   Diarrhea   Protein-calorie malnutrition, severe ([facility])   Bilateral leg edema   Shakhia Gramajo, [facility] [place] [facility] Pager [phone]. If 7PM-7AM, please contact night-coverage at [email or web address], password [facility] 07/08/2015, 4:22 PM  LOS: 0 days

## 2015-07-08 NOTE — Progress Notes (Signed)
Initial Nutrition Assessment  DOCUMENTATION CODES:   Severe malnutrition in context of chronic illness  INTERVENTION:  -Supplement with Nepro between meals. Each supplement will provide 425 kcals and 19 g of protein.   NUTRITION DIAGNOSIS:   Malnutrition related to poor appetite, chronic illness as evidenced by percent weight loss, severe depletion of muscle mass, moderate depletion of body fat.    GOAL:   Weight gain    MONITOR:   Supplement acceptance, Labs, Weight trends, PO intake  REASON FOR ASSESSMENT:   Consult Assessment of nutrition requirement/status  ASSESSMENT:   Mr. Yepes is a very active 75yo with no PMHx who presents today after his PCP sent him after abnormal kidney labs. He had not seen a doctor or had blood tests in many years. Patient has been having frequency and urgency over the past couple months. No gross hematuria. Patient reports he has had increased thirst and fluid intake. Patient reports over a 20lb weight loss over the past few months. He reports increasing fatigue and loss of appetite. He began drinking Ensure on Monday. His family reports that his voice has started to sound "weaker" over the past week. Patient states he has had an occasional lower abdominal pain that occurs multiple times per day, but not daily. Patient had 1 episode of bilious vomiting 2 weeks ago. Patient reports he has noticed his legs starting to swell over the past few months. He stated his legs started itching before they started to swell. Patient denies chest pain, shortness of breath, headache.  Pt reports weight loss of about 15 lbs over the past 2 months. Weight loss significant for length of time. UBW reported by pt and wife as 150 lbs and current weight is 131 lbs. Pt has had a 13% weight loss in the past 2 months. Pt contributes weight loss to poor appetite and altered taste. However, pt states that appetite and taste have come back since being in the hospital. He ate  100% of his breakfast and lunch. RD will order Nepro shake to supplement diet TID between meals. Diet hx reported by pt states cornflakes consumed in the am and a few bites of home cooked dinner consumed at night.   NFPE: Moderate fat depletion, severe muscle depletion, no edema.   Diet Order:  Diet renal with fluid restriction Fluid restriction:: Other (see comments); Room service appropriate?: Yes; Fluid consistency:: Thin  Skin:  Reviewed, no issues  Last BM:  3/17  Height:   Ht Readings from Last 1 Encounters:  07/08/15 5\' 7"  (1.702 m)    Weight:   Wt Readings from Last 1 Encounters:  07/08/15 131 lb (59.421 kg)    Ideal Body Weight:  148 kg  BMI:  Body mass index is 20.51 kg/(m^2).  Estimated Nutritional Needs:   Kcal:  1,800-2,100  Protein:  100-110 g  Fluid:  1.8-2.1  EDUCATION NEEDS:   No education needs identified at this time  Geoffery Lyons, [place] Dietetic Intern Pager [phone]

## 2015-07-08 NOTE — Progress Notes (Signed)
Admit: 07/07/2015 LOS: 0  42M minimal medical care presenting with Acute vs Chronic Renal Failure and Bladder Outlet Obstruction and prostatomegaly.    Subjective:  Foley in, huge UOP Less Edema Labs improving, K normalized Feels better Preceding had fatigue and edema for several weeks    03/16 0701 - 03/17 0700 In: -  Out: 4900 [Urine:4900]  Filed Weights   07/07/15 1912 07/08/15 1005  Weight: 62.596 kg (138 lb) 59.421 kg (131 lb)    Scheduled Meds: . amLODipine  10 mg Oral Daily  . heparin  5,000 Units Subcutaneous 3 times per day  . sodium chloride flush  3 mL Intravenous Q12H  . tamsulosin  0.4 mg Oral Daily   Continuous Infusions: . sodium chloride 125 mL/hr at 07/08/15 1446   PRN Meds:.acetaminophen **OR** acetaminophen, hydrALAZINE, ondansetron **OR** ondansetron (ZOFRAN) IV, zolpidem  Current Labs: reviewed    Physical Exam:  Blood pressure 153/75, pulse 72, temperature 97.6 F (36.4 C), temperature source Oral, resp. rate 16, height 5\' 7"  (1.702 m), weight 59.421 kg (131 lb), SpO2 100 %. NAD in chair RRR CTAB S/nt/nd Foley in place  Trace-1+ LEE Nonfocal NCAT  A 1. Acute vs Chronic Renal Failure 2/2 obstructiion / BOO with hydroureter and hydronephrosis 2. Prostate Enlargement and Bladder Outlet Obstruction 1. Flomax and CCB started 2. PSA very elevated high suspicion for malignancy 3. Hyperkalemia, resolved 4. Metabolic Acidosis, improving 5. HTN 6. Anemia, borderline microcytic  P 1. Having strong diuresis, inc NS to 124mL/hr 2. Will need urology eval for BOO and elevated PSA 3. Repeat labs again this evening   Pearson Grippe MD 07/08/2015, 3:13 PM   Recent Labs Lab 07/08/15 0115 07/08/15 0440 07/08/15 1247  NA 141 144 146*  K 6.2* 5.4* 4.8  CL 113* 114* 114*  CO2 14* 13* 18*  GLUCOSE 242* 66 124*  BUN 134* 130* 119*  CREATININE 8.06* 7.67* 6.87*  CALCIUM 9.3 9.6 8.8*  PHOS  --  6.0*  --     Recent Labs Lab 07/05/15 1801  07/07/15 1946 07/08/15 0115  WBC 3.9* 4.3 3.7*  NEUTROABS  --  2.8  --   HGB 8.4* 8.1* 7.2*  HCT 24.0* 23.4* 21.9*  MCV 81.9 80.7 80.8  PLT  --  211 195

## 2015-07-08 NOTE — Evaluation (Signed)
Physical Therapy Evaluation Patient Details Name: Joel [name] MRN: [id number] DOB: 04-15-1941 Today's Date: 07/08/2015   History of Present Illness  75 y.o. male who hasn't seen a doctor in 20 years admitted with generalized weakness, body weight loss, diarrhea and bilateral leg edema. Dx of AKI  Clinical Impression  Pt admitted with above diagnosis. Pt currently with functional limitations due to the deficits listed below (see PT Problem List). Pt ambulated 180' without assistive device, mild loss of balance x 2 which pt was able to self correct. Will follow for high level balance training, he may benefit from a cane for home depending on progress. Pt will benefit from skilled PT to increase their independence and safety with mobility to allow discharge to the venue listed below.       Follow Up Recommendations No PT follow up    Equipment Recommendations  Cane    Recommendations for Other Services       Precautions / Restrictions Precautions Precautions: None Precaution Comments: no falls in past year per pt and wife Restrictions Weight Bearing Restrictions: No      Mobility  Bed Mobility Overal bed mobility: Modified Independent             General bed mobility comments: with bedrail  Transfers Overall transfer level: Needs assistance Equipment used: None Transfers: Sit to/from Stand Sit to Stand: Min guard         General transfer comment: min/guard for balance, mild posterior lean initially in standing, pt able to self correct  Ambulation/Gait Ambulation/Gait assistance: Min guard Ambulation Distance (Feet): 180 Feet Assistive device: None Gait Pattern/deviations: Scissoring;Step-through pattern   Gait velocity interpretation: at or above normal speed for age/gender General Gait Details: scissoring x 2, pt able to self correct  Stairs            Wheelchair Mobility    Modified Rankin (Stroke Patients Only)       Balance     Sitting  balance-Leahy Scale: Normal       Standing balance-Leahy Scale: Good                               Pertinent Vitals/Pain Pain Assessment: No/denies pain    Home Living Family/patient expects to be discharged to:: Private residence Living Arrangements: Spouse/significant other Available Help at Discharge: Family   Home Access: Stairs to enter   Technical brewer of Steps: 5 Home Layout: One level Home Equipment: None      Prior Function Level of Independence: Independent         Comments: works as Nurse, adult        Extremity/Trunk Assessment   Upper Extremity Assessment: Overall WFL for tasks assessed           Lower Extremity Assessment: Overall WFL for tasks assessed      Cervical / Trunk Assessment: Normal  Communication   Communication: No difficulties  Cognition Arousal/Alertness: Awake/alert Behavior During Therapy: WFL for tasks assessed/performed Overall Cognitive Status: Within Functional Limits for tasks assessed                      General Comments      Exercises        Assessment/Plan    PT Assessment Patient needs continued PT services  PT Diagnosis Generalized weakness;Difficulty walking   PT Problem List Decreased balance  PT Treatment Interventions Gait  training;Balance training   PT Goals (Current goals can be found in the Care Plan section) Acute Rehab PT Goals 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 PT Goal Formulation: With patient/family Time For Goal Achievement: 07/22/15 Potential to Achieve Goals: Good    Frequency Min 3X/week   Barriers to discharge        Co-evaluation               End of Session Equipment Utilized During Treatment: Gait belt Activity Tolerance: Patient tolerated treatment well Patient left: in chair;with family/visitor present;with call bell/phone within reach Nurse Communication: Mobility status    Functional  Assessment Tool Used: clinical judgement Functional Limitation: Mobility: Walking and moving around Mobility: Walking and Moving Around Current Status [phone]): At least 1 percent but less than 20 percent impaired, limited or restricted Mobility: Walking and Moving Around Goal Status [phone]): 0 percent impaired, limited or restricted    Time: [id number] PT Time Calculation (min) (ACUTE ONLY): 21 min   Charges:   PT Evaluation $PT Eval Low Complexity: 1 Procedure     PT G Codes:   PT G-Codes **NOT FOR INPATIENT CLASS** Functional Assessment Tool Used: clinical judgement Functional Limitation: Mobility: Walking and moving around Mobility: Walking and Moving Around Current Status [id number]): At least 1 percent but less than 20 percent impaired, limited or restricted Mobility: Walking and Moving Around Goal Status [phone]): 0 percent impaired, limited or restricted    Philomena Doheny 07/08/2015, 2:55 PM [phone]

## 2015-07-08 NOTE — H&P (Addendum)
[facility] History and Physical  Joel [name] [id number] DOB: 17-May-1940 DOA: 07/07/2015  Referring physician: ED physician PCP: No primary care provider on file.  Specialists:   Chief Complaint: Generalized weakness, body weight loss, diarrhea, bilateral leg edema  HPI: Joel [name] is a 75 y.o. male without significant PMH, not seen by doctor for 20 years, who presents with generalized weakness, body weight loss, diarrhea and bilateral leg edema.  Patient reports that he has been feeling weak recently. No unilateral weakness, numbness or tingling sensations. He has decreased oral intake and appetite, lost 20 pounds over past 3 months. He did not notice any hematuria, hematochezia. He does not smoke. He also has been having diarrhea in the past 2 days. He had 2 bowel movements with loose stool today. No recent antibiotics use. Patient also reports having bilateral lower leg edema in the past 3 months. No chest pain, shortness rest, cough, fever, chills, nausea, vomiting, abdominal pain or symptoms of UTI. Patient was seen by primary care doctor 2 days ago, who did some lab work and found that the patient has acute renal injury, advised patient to come to hospital for further eval and treatment.  In ED, patient was found to have acute renal injury with creatinine 8.17 and BUN 132, anemia with hemoglobin 8.1 and MCV 80.7, potassium 6.5 without EKG changes, bicarbonate 17, WBC 4.3, negative urinalysis, temperature normal, no tachycardia, no tachypnea, negative chest x-ray for acute abnormalities. Renal US showed prostatomegaly with bladder outlet obstruction causing bilateral hydronephrosis with bladder debris. Patient's ability to inpatient for further evaluation and treatment. Renal was consulted by ED, will see in AM.  EKG: Independently reviewed. QTC 441, no T-wave peaking.  Where does patient live?   At home  Can patient participate in ADLs?   Some   Review of Systems:    General: no fevers, chills, has body weight loss, has poor appetite, has fatigue HEENT: no blurry vision, hearing changes or sore throat Pulm: no dyspnea, coughing, wheezing CV: no chest pain, no palpitations Abd: no nausea, vomiting, abdominal pain, has diarrhea, no constipation GU: no dysuria, burning on urination, increased urinary frequency, hematuria  Ext: has bilateral leg edema Neuro: no unilateral weakness, numbness, or tingling, no vision change or hearing loss Skin: no rash MSK: No muscle spasm, no deformity, no limitation of range of movement in spin Heme: No easy bruising.  Travel history: No recent long distant travel.  Allergy: No Known Allergies  History reviewed. No pertinent past medical history.  History reviewed. No pertinent past surgical history.  Social History:  reports that he has never smoked. He does not have any smokeless tobacco history on file. He reports that he does not drink alcohol. His drug history is not on file.  Family History:  Family History  Problem Relation Age of Onset  . Dementia Mother   . Heart disease Father   . Lung cancer Brother   . Lupus Sister      Prior to Admission medications   Not on File    Physical Exam: Filed Vitals:   07/07/15 1912 07/08/15 0023 07/08/15 0030 07/08/15 0045  BP: 180/102 195/97 183/99 206/93  Pulse: 79 72 70 70  Temp: 98.5 F (36.9 C)     TempSrc: Oral     Resp: 18 12 12 12   Height: 5\' 7"  (1.702 m)     Weight: 62.596 kg (138 lb)     SpO2: 100% 99% 99% 100%   General: Not in acute  distress. Dry mucus and membrane HEENT:       Eyes: PERRL, EOMI, no scleral icterus.       ENT: No discharge from the ears and nose, no pharynx injection, no tonsillar enlargement.        Neck: No JVD, no bruit, no mass felt. Heme: No neck lymph node enlargement. Cardiac: S1/S2, RRR, No murmurs, No gallops or rubs. Pulm: No rales, wheezing, rhonchi or rubs. Abd: Soft, nondistended, nontender, no rebound pain,  no organomegaly, BS present. Ext: 1+ pitting leg edema bilaterally. 2+DP/PT pulse bilaterally. Musculoskeletal: No joint deformities, No joint redness or warmth, no limitation of ROM in spin. Skin: No rashes.  Neuro: Alert, oriented X3, cranial nerves II-XII grossly intact, moves all extremities normally. Psych: Patient is not psychotic, no suicidal or hemocidal ideation.  Labs on Admission:  Basic Metabolic Panel:  Recent Labs Lab 07/05/15 1745 07/07/15 1946 07/07/15 2306 07/08/15 0115  NA 139 144  --  141  K 5.6* 6.5*  --  6.2*  CL 110 112*  --  113*  CO2 15* 17*  --  14*  GLUCOSE 99 133*  --  242*  BUN 113* 132*  --  134*  CREATININE 7.94* 8.17*  --  8.06*  CALCIUM 9.2 9.8  --  9.3  MG  --   --  2.0  --    Liver Function Tests:  Recent Labs Lab 07/05/15 1745 07/07/15 1946  AST 21 30  ALT 18 24  ALKPHOS 46 47  BILITOT 0.5 0.4  PROT 7.7 7.8  ALBUMIN 4.2 4.2   No results for input(s): LIPASE, AMYLASE in the last 168 hours. No results for input(s): AMMONIA in the last 168 hours. CBC:  Recent Labs Lab 07/05/15 1801 07/07/15 1946 07/08/15 0115  WBC 3.9* 4.3 3.7*  NEUTROABS  --  2.8  --   HGB 8.4* 8.1* 7.2*  HCT 24.0* 23.4* 21.9*  MCV 81.9 80.7 80.8  PLT  --  211 195   Cardiac Enzymes:  Recent Labs Lab 07/08/15 0106  CKTOTAL 482*    BNP (last 3 results)  Recent Labs  07/08/15 0106  BNP 104.0*    ProBNP (last 3 results) No results for input(s): PROBNP in the last 8760 hours.  CBG: No results for input(s): GLUCAP in the last 168 hours.  Radiological Exams on Admission: Dg Chest 2 View  07/07/2015  CLINICAL DATA:  Acute onset of abnormal blood tests. Initial encounter. EXAM: CHEST  2 VIEW COMPARISON:  None. FINDINGS: The lungs are well-aerated and clear. There is no evidence of focal opacification, pleural effusion or pneumothorax. The heart is normal in size; the mediastinal contour is within normal limits. No acute osseous abnormalities are  seen. IMPRESSION: No acute cardiopulmonary process seen. Electronically Signed   By: Garald Balding M.D.   On: 07/07/2015 23:01   US Renal  07/08/2015  CLINICAL DATA:  Acute kidney injury EXAM: RENAL / URINARY TRACT ULTRASOUND COMPLETE COMPARISON:  None. FINDINGS: Right Kidney: Length: 11 cm. Borderline echogenic. High-grade hydronephrosis and upper hydroureter. No evidence of mass lesion. Left Kidney: Length: 12 cm. Borderline echogenic. High-grade hydronephrosis and hydroureter. No evidence of mass lesion. Bladder: Overdistended bladder with 1100 cc volume. The patient could only urinate 150 cc. Enlarged prostate with nodular component projecting into the bladder. Floating debris in the bladder. These results were called by telephone at the time of interpretation on 07/08/2015 at 1:47 am to Dr. Ivor Costa , who verbally acknowledged these results. IMPRESSION:  1. Prostatomegaly with bladder outlet obstruction causing bilateral hydronephrosis. 2. Bladder debris, correlate with urinalysis. Electronically Signed   By: Monte Fantasia M.D.   On: 07/08/2015 01:47    Assessment/Plan Principal Problem:   AKI (acute kidney injury) ([place]) Active Problems:   Normocytic anemia   Hyperkalemia   Elevated blood pressure   Renal failure   Diarrhea   Protein-calorie malnutrition, severe ([facility])   Bilateral leg edema   AKI (acute kidney injury) ([place]): US showed prostatomegaly with bladder outlet obstruction causing bilateral hydronephrosis with bladder debris. Other differential diagnosis include MM given severe anemia, hypertensive renal disease, prerenal failure due to dehydration secondary to diarrhea. Renal was consulted by EDP, we'll see in the morning.  -will admit to tele bed (due to hyperkalemia) follow observation  -Follow-up renal's recommendations -check FeNa, SPEP, CK -put Foley cath and follow post obstructive diuresis closely  -IV fluid: Normal saline 500 mL, followed by 75 mL per hour  -Start  Flomax   Prostatomegaly: as shown on US-renal. -check PSA -may f/u with urology -Flomax  Normocytic anemia: Hemoglobin 8.1 on admission. Patient never had colonoscopy or EGD  -Anemia panel and FOBT -Patient need outpatient colonoscopy  Hyperkalemia: Potassium 6.5 without EKG change. This is most likely due to AKI -Treated with insulin and Kayexalate 30 g 1 by EDP   Elevated blood pressure and possible and diagnosed hypertension: Blood pressure is elevated at 180/102 on admission. Not taking medications. -Start amlodipine 10 mg daily -Also on Flomax as above -IV hydralazine when necessary  Diarrhea: unclear etiology. -Check C. difficile PCR and GI pathogen panel -IV fluids as above  Protein-calorie malnutrition, severe ([place]): lost 20 LBs over 3 month.  -Consult to nutrition team  Bilateral leg edema: Unclear etiology, differential diagnosis includes renal failure and CHF 2/2 uncontroled HTN. Liver Fx OK. -check 2e echo -BNP   DVT ppx: (Pt has severe AKI, sq heparin is better choice than sq Lovenox)   Code Status: Full code Family Communication: Yes, patient's wife and daughter  at bed side Disposition Plan: Admit to inpatient   Date of Service 07/08/2015    Ivor Costa [facility] Pager [phone]  If 7PM-7AM, please contact night-coverage [email or web address] Password [facility] 07/08/2015, 2:31 AM

## 2015-07-08 NOTE — Progress Notes (Signed)
Jaggar Zaborski is a 75 y.o. male patient admitted from ED awake, alert - oriented  X 4 - no acute distress noted.  VSS - Blood pressure 153/75, pulse 72, temperature 97.6 F (36.4 C), temperature source Oral, resp. rate 16, height 5\' 7"  (1.702 m), weight 59.421 kg (131 lb), SpO2 100 %.    IV in place, occlusive dsg intact without redness.  Orientation to room, and floor completed with information packet given to patient/family.  Patient declined safety video at this time.  Admission INP armband ID verified with patient/family, and in place.   SR up x 2, fall assessment complete, with patient and family able to verbalize understanding of risk associated with falls, and verbalized understanding to call nsg before up out of bed.  Call light within reach, patient able to voice, and demonstrate understanding.  Skin, clean-dry- intact without evidence of bruising, or skin tears.   No evidence of skin break down noted on exam.   Placed on enteric precautions  Will cont to eval and treat per MD orders.  Janalyn Shy, RN 07/08/2015 10:23 AM

## 2015-07-08 NOTE — Consult Note (Signed)
[name] Renal Consultation Note  Requesting MD: Joel [name] Indication for Consultation: ?AKI/ BOO  HPI:  Joel [name] is a 75 y.o. 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 Verline [name] on March 14 with complaints of lower extremity edema, decreased appetite with weight loss and weakness that he had noticed over the last 2-3 months. Labs were done and showed a creatinine of 7.94 and he was instructed to come to the emergency department for evaluation. Here his creatinine is over 8 also with a potassium of 6.5, bicarbonate 17, hemoglobin of 8 but a surprisingly clean urinalysis. Also part of the workup was a renal ultrasound which showed bilateral hydronephrosis. He had placement of a Foley catheter with immediate return of 1700 of urine and feels better. He did not feel that he was having any issues passing his urine of late. He has not taken any NSAIDs or really any medicines. He told her the last time he saw Dr. 30 years ago they told him his blood pressure was a little bit high but did not warrant treatment. He's been getting his DOT "physicals' but lab work is not a part of those.  He tells me that he feels much better since Foley catheter was placed  CREAT  Date/Time Value Ref Range Status  07/05/2015 05:45 PM 7.94* 0.70 - 1.18 mg/dL Final   CREATININE, SER  Date/Time Value Ref Range Status  07/08/2015 01:15 AM 8.06* 0.61 - 1.24 mg/dL Final  07/07/2015 07:46 PM 8.17* 0.61 - 1.24 mg/dL Final     PMHx:  History reviewed. No pertinent past medical history.  History reviewed. No pertinent past surgical history.  Family Hx:  Family History  Problem Relation Age of Onset  . Dementia Mother   . Heart disease Father   . Lung cancer Brother   . Lupus Sister     Social History:  reports that he has never smoked. He does not have any smokeless tobacco history on file. He reports that he does not  drink alcohol. His drug history is not on file.  Allergies: No Known Allergies  Medications: Prior to Admission medications   Not on File    I have reviewed the patient's current medications.  Labs:  Results for orders placed or performed during the hospital encounter of 07/07/15 (from the past 48 hour(s))  Urinalysis, Routine w reflex microscopic (not at [facility])     Status: Abnormal   Collection Time: 07/07/15  7:16 PM  Result Value Ref Range   Color, Urine YELLOW YELLOW   APPearance CLEAR CLEAR   Specific Gravity, Urine 1.010 1.005 - 1.030   pH 5.5 5.0 - 8.0   Glucose, UA NEGATIVE NEGATIVE mg/dL   Hgb urine dipstick SMALL (A) NEGATIVE   Bilirubin Urine NEGATIVE NEGATIVE   Ketones, ur NEGATIVE NEGATIVE mg/dL   Protein, ur NEGATIVE NEGATIVE mg/dL   Nitrite NEGATIVE NEGATIVE   Leukocytes, UA NEGATIVE NEGATIVE  Urine microscopic-add on     Status: Abnormal   Collection Time: 07/07/15  7:16 PM  Result Value Ref Range   Squamous Epithelial / LPF 0-5 (A) NONE SEEN   WBC, UA 0-5 0 - 5 WBC/hpf   RBC / HPF 0-5 0 - 5 RBC/hpf   Bacteria, UA RARE (A) NONE SEEN  CBC with Differential     Status: Abnormal   Collection Time: 07/07/15  7:46 PM  Result  Value Ref Range   WBC 4.3 4.0 - 10.5 K/uL   RBC 2.90 (L) 4.22 - 5.81 MIL/uL   Hemoglobin 8.1 (L) 13.0 - 17.0 g/dL   HCT 23.4 (L) 39.0 - 52.0 %   MCV 80.7 78.0 - 100.0 fL   MCH 27.9 26.0 - 34.0 pg   MCHC 34.6 30.0 - 36.0 g/dL   RDW 14.2 11.5 - 15.5 %   Platelets 211 150 - 400 K/uL   Neutrophils Relative % 66 %   Lymphocytes Relative 28 %   Monocytes Relative 6 %   Eosinophils Relative 0 %   Basophils Relative 0 %   Neutro Abs 2.8 1.7 - 7.7 K/uL   Lymphs Abs 1.2 0.7 - 4.0 K/uL   Monocytes Absolute 0.3 0.1 - 1.0 K/uL   Eosinophils Absolute 0.0 0.0 - 0.7 K/uL   Basophils Absolute 0.0 0.0 - 0.1 K/uL  Comprehensive metabolic panel     Status: Abnormal   Collection Time: 07/07/15  7:46 PM  Result Value Ref Range   Sodium 144 135 - 145  mmol/L   Potassium 6.5 (HH) 3.5 - 5.1 mmol/L    Comment: NO VISIBLE HEMOLYSIS CRITICAL RESULT CALLED TO, READ BACK BY AND VERIFIED WITH: SANGELANG B,RN 07/07/15 2050 WAYK    Chloride 112 (H) 101 - 111 mmol/L   CO2 17 (L) 22 - 32 mmol/L   Glucose, Bld 133 (H) 65 - 99 mg/dL   BUN 132 (H) 6 - 20 mg/dL   Creatinine, Ser 8.17 (H) 0.61 - 1.24 mg/dL   Calcium 9.8 8.9 - 10.3 mg/dL   Total Protein 7.8 6.5 - 8.1 g/dL   Albumin 4.2 3.5 - 5.0 g/dL   AST 30 15 - 41 U/L   ALT 24 17 - 63 U/L   Alkaline Phosphatase 47 38 - 126 U/L   Total Bilirubin 0.4 0.3 - 1.2 mg/dL   GFR calc non Af Amer 6 (L) >60 mL/min   GFR calc Af Amer 7 (L) >60 mL/min    Comment: (NOTE) The eGFR has been calculated using the CKD EPI equation. This calculation has not been validated in all clinical situations. eGFR's persistently <60 mL/min signify possible Chronic Kidney Disease.    Anion gap 15 5 - 15  Magnesium     Status: None   Collection Time: 07/07/15 11:06 PM  Result Value Ref Range   Magnesium 2.0 1.7 - 2.4 mg/dL  CK     Status: Abnormal   Collection Time: 07/08/15  1:06 AM  Result Value Ref Range   Total CK 482 (H) 49 - 397 U/L  Lipid panel     Status: None   Collection Time: 07/08/15  1:06 AM  Result Value Ref Range   Cholesterol 162 0 - 200 mg/dL   Triglycerides 40 <150 mg/dL   HDL 76 >40 mg/dL   Total CHOL/HDL Ratio 2.1 RATIO   VLDL 8 0 - 40 mg/dL   LDL Cholesterol 78 0 - 99 mg/dL    Comment:        Total Cholesterol/HDL:CHD Risk Coronary Heart Disease Risk Table                     Men   Women  1/2 Average Risk   3.4   3.3  Average Risk       5.0   4.4  2 X Average Risk   9.6   7.1  3 X Average Risk  23.4   11.0  Use the calculated Patient Ratio above and the CHD Risk Table to determine the patient's CHD Risk.        ATP III CLASSIFICATION (LDL):  <100     mg/dL   Optimal  100-129  mg/dL   Near or Above                    Optimal  130-159  mg/dL   Borderline  160-189  mg/dL    High  >190     mg/dL   Very High   Brain natriuretic peptide     Status: Abnormal   Collection Time: 07/08/15  1:06 AM  Result Value Ref Range   B Natriuretic Peptide 104.0 (H) 0.0 - 100.0 pg/mL  Protime-INR     Status: Abnormal   Collection Time: 07/08/15  1:06 AM  Result Value Ref Range   Prothrombin Time 17.1 (H) 11.6 - 15.2 seconds   INR 1.38 0.00 - 1.49  APTT     Status: None   Collection Time: 07/08/15  1:06 AM  Result Value Ref Range   aPTT 31 24 - 37 seconds  Basic metabolic panel     Status: Abnormal   Collection Time: 07/08/15  1:15 AM  Result Value Ref Range   Sodium 141 135 - 145 mmol/L   Potassium 6.2 (HH) 3.5 - 5.1 mmol/L    Comment: CRITICAL RESULT CALLED TO, READ BACK BY AND VERIFIED WITH: HUGHES C,RN 07/08/15 0222 WAYK    Chloride 113 (H) 101 - 111 mmol/L   CO2 14 (L) 22 - 32 mmol/L   Glucose, Bld 242 (H) 65 - 99 mg/dL   BUN 134 (H) 6 - 20 mg/dL   Creatinine, Ser 8.06 (H) 0.61 - 1.24 mg/dL   Calcium 9.3 8.9 - 10.3 mg/dL   GFR calc non Af Amer 6 (L) >60 mL/min   GFR calc Af Amer 7 (L) >60 mL/min    Comment: (NOTE) The eGFR has been calculated using the CKD EPI equation. This calculation has not been validated in all clinical situations. eGFR's persistently <60 mL/min signify possible Chronic Kidney Disease.    Anion gap 14 5 - 15  CBC     Status: Abnormal   Collection Time: 07/08/15  1:15 AM  Result Value Ref Range   WBC 3.7 (L) 4.0 - 10.5 K/uL   RBC 2.71 (L) 4.22 - 5.81 MIL/uL   Hemoglobin 7.2 (L) 13.0 - 17.0 g/dL   HCT 21.9 (L) 39.0 - 52.0 %   MCV 80.8 78.0 - 100.0 fL   MCH 26.6 26.0 - 34.0 pg   MCHC 32.9 30.0 - 36.0 g/dL   RDW 14.2 11.5 - 15.5 %   Platelets 195 150 - 400 K/uL  Creatinine, urine, random     Status: None   Collection Time: 07/08/15  2:39 AM  Result Value Ref Range   Creatinine, Urine 60.45 mg/dL  Sodium, urine, random     Status: None   Collection Time: 07/08/15  2:39 AM  Result Value Ref Range   Sodium, Ur 65 mmol/L  Urine  rapid drug screen (hosp performed)     Status: None   Collection Time: 07/08/15  2:39 AM  Result Value Ref Range   Opiates NONE DETECTED NONE DETECTED   Cocaine NONE DETECTED NONE DETECTED   Benzodiazepines NONE DETECTED NONE DETECTED   Amphetamines NONE DETECTED NONE DETECTED   Tetrahydrocannabinol NONE DETECTED NONE DETECTED   Barbiturates NONE DETECTED NONE DETECTED  Comment:        DRUG SCREEN FOR MEDICAL PURPOSES ONLY.  IF CONFIRMATION IS NEEDED FOR ANY PURPOSE, NOTIFY LAB WITHIN 5 DAYS.        LOWEST DETECTABLE LIMITS FOR URINE DRUG SCREEN Drug Class       Cutoff (ng/mL) Amphetamine      1000 Barbiturate      200 Benzodiazepine   423 Tricyclics       536 Opiates          300 Cocaine          300 THC              50      ROS:  A comprehensive review of systems was negative except for: Constitutional: positive for fatigue and malaise Cardiovascular: positive for lower extremity edema Gastrointestinal: positive for abdominal pain, nausea, vomiting and Weight loss  Physical Exam: Filed Vitals:   07/08/15 0245 07/08/15 0315  BP: 141/104 157/81  Pulse: 84 76  Temp:    Resp: 13      General: Very thin black male who is alert and in no acute distress HEENT: Pupils are equally round and reactive to light, extra motions are intact, mucous membranes are moist Neck: There is no JVD  Heart: Regular rate and rhythm without murmur  Lungs: Clear to auscultation bilaterally  Abdomen: Distended nonpainful  Extremities: 2+ pitting edema  Skin: Warm and dry  Neuro: Alert and nonfocal   Assessment/Plan: 75 year old black male who has not been to the doctor for 30 years. He presents with weakness, lower extremity edema, appetite change in weight loss- found to have an elevated creatinine but also bilateral hydronephrosis  1.Renal- elevated creatinine with uremic symptoms also finding of bilateral hydronephrosis.  Agree with Foley catheter placement and has had nearly 2 L of  urine out since placed. Really not sure how long he's been obstructed.  I sincerely hope for his sake the kidney function will improve with continued Foley catheter drainage. Urinalysis is fairly bland so this doesn't appear to be an intrarenal process.  If kidney function does not improve his numbers are abnormal enough that he may require dialysis and I'm not sure he understands how serious this is  2. Hypertension/volume  - is overloaded in the setting of bladder outlet obstruction. After Foley catheter placement has had immediate diuresis. I hope that this will continue. We'll need to make sure work keeping up with fluid losses - currently IVF at 43 an hour.  Amlodipine and Flomax have been started. If continues to auto diurese blood pressure may normalize- wi'll follow 3. Anemia  - is significant in nature. This makes me think that his CKD as been going on longer.  Also could have another etiology for anemia. Check iron stores and treat as needed 4. Hyperkalemia- secondary to renal failure. Hopefully with bladder drainage potassium will improve. Was given a dose of Kayexalate as well.  Fay Bagg A 07/08/2015, 4:06 AM

## 2015-07-08 NOTE — Progress Notes (Signed)
Report received from Tse [place] for admission to [phone]

## 2015-07-09 LAB — FOLATE: FOLATE: 11.2 ng/mL (ref >5.9)

## 2015-07-09 LAB — RENAL FUNCTION PANEL
Albumin: 3.3 g/dL — ABNORMAL LOW (ref 3.5–5.0)
Anion gap: 12 (ref 5–15)
BUN: 99 mg/dL — AB (ref 6–20)
CHLORIDE: 117 mmol/L — AB (ref 101–111)
CO2: 15 mmol/L — AB (ref 22–32)
CREATININE: 5.12 mg/dL — AB (ref 0.61–1.24)
Calcium: 8 mg/dL — ABNORMAL LOW (ref 8.9–10.3)
GFR, EST AFRICAN AMERICAN: 12 mL/min — AB (ref >60)
GFR, EST NON AFRICAN AMERICAN: 10 mL/min — AB (ref >60)
Glucose, Bld: 96 mg/dL (ref 65–99)
POTASSIUM: 3.8 mmol/L (ref 3.5–5.1)
Phosphorus: 4.6 mg/dL (ref 2.5–4.6)
Sodium: 144 mmol/L (ref 135–145)

## 2015-07-09 LAB — BASIC METABOLIC PANEL
ANION GAP: 11 (ref 5–15)
BUN: 91 mg/dL — AB (ref 6–20)
CO2: 18 mmol/L — AB (ref 22–32)
Calcium: 7.9 mg/dL — ABNORMAL LOW (ref 8.9–10.3)
Chloride: 114 mmol/L — ABNORMAL HIGH (ref 101–111)
Creatinine, Ser: 4.72 mg/dL — ABNORMAL HIGH (ref 0.61–1.24)
GFR calc Af Amer: 13 mL/min — ABNORMAL LOW (ref >60)
GFR calc non Af Amer: 11 mL/min — ABNORMAL LOW (ref >60)
GLUCOSE: 122 mg/dL — AB (ref 65–99)
POTASSIUM: 4 mmol/L (ref 3.5–5.1)
Sodium: 143 mmol/L (ref 135–145)

## 2015-07-09 LAB — GASTROINTESTINAL PANEL BY PCR, STOOL (REPLACES STOOL CULTURE)
ADENOVIRUS F40/41: NOT DETECTED
ASTROVIRUS: NOT DETECTED
CAMPYLOBACTER SPECIES: NOT DETECTED
Cryptosporidium: NOT DETECTED
Cyclospora cayetanensis: NOT DETECTED
E. coli O157: NOT DETECTED
ENTEROAGGREGATIVE E COLI (EAEC): NOT DETECTED
ENTEROPATHOGENIC E COLI (EPEC): NOT DETECTED
ENTEROTOXIGENIC E COLI (ETEC): NOT DETECTED
Entamoeba histolytica: NOT DETECTED
GIARDIA LAMBLIA: NOT DETECTED
Norovirus GI/GII: NOT DETECTED
PLESIMONAS SHIGELLOIDES: NOT DETECTED
ROTAVIRUS A: NOT DETECTED
SHIGA LIKE TOXIN PRODUCING E COLI (STEC): NOT DETECTED
Salmonella species: NOT DETECTED
Sapovirus (I, II, IV, and V): NOT DETECTED
Shigella/Enteroinvasive E coli (EIEC): NOT DETECTED
Vibrio cholerae: NOT DETECTED
Vibrio species: NOT DETECTED
Yersinia enterocolitica: NOT DETECTED

## 2015-07-09 LAB — IRON AND TIBC
IRON: 60 ug/dL (ref 45–182)
SATURATION RATIOS: 29 % (ref 17.9–39.5)
TIBC: 204 ug/dL — ABNORMAL LOW (ref 250–450)
UIBC: 144 ug/dL

## 2015-07-09 LAB — HEMOGLOBIN A1C
Hgb A1c MFr Bld: 6.1 % — ABNORMAL HIGH (ref 4.8–5.6)
MEAN PLASMA GLUCOSE: 128 mg/dL

## 2015-07-09 LAB — RETICULOCYTES
RBC.: 2.84 MIL/uL — ABNORMAL LOW (ref 4.22–5.81)
RETIC COUNT ABSOLUTE: 17 10*3/uL — AB (ref 19.0–186.0)
Retic Ct Pct: 0.6 % (ref 0.4–3.1)

## 2015-07-09 LAB — VITAMIN B12: VITAMIN B 12: 251 pg/mL (ref 180–914)

## 2015-07-09 LAB — FERRITIN: FERRITIN: 240 ng/mL (ref 24–336)

## 2015-07-09 MED ORDER — LACTATED RINGERS IV SOLN
INTRAVENOUS | Status: DC
  Administered 2015-07-09 – 2015-07-10 (×2): via INTRAVENOUS

## 2015-07-09 NOTE — Progress Notes (Signed)
[facility] PROGRESS NOTE  Joel [name] [id number] DOB: [birth year] DOA: 07/07/2015 PCP: No primary care provider on file.  HPI/Brief narrative 75 y.o. male without significant PMH, not seen by doctor for 20 years, who presents with generalized weakness, body weight loss, diarrhea and bilateral leg edema. In the ED, patient was found to have acute renal injury with creatinine 8.17 and BUN 132, anemia with hemoglobin 8.1 and MCV 80.7, potassium 6.5 without EKG changes, bicarbonate 17  Assessment/Plan: AKI (acute kidney injury) ([place]): US showed prostatomegaly with bladder outlet obstruction causing bilateral hydronephrosis with bladder debris. -Seems to be improving following placement of Foley cath  -Started Flomax on admit -PSA is elevated at just under 100, worrisome for prostate ca -Cont to follow renal function -Nephrology following  Prostatomegaly: as shown on US-renal. -Will need f/u with urology and likely need indwelling cath on eventual d/c -Flomax was started in ED -Not complaining of back pain -PSA just under 100  Normocytic anemia: Hemoglobin 8.1 on admission. Patient never had colonoscopy or EGD  -Pt reportedly already scheduled for outpatient colonoscopy  Hyperkalemia: Potassium 6.5 without EKG change on presentation. This is most likely due to AKI -Treated with insulin and Kayexalate 30 g 1 by EDP  -Normalized  Elevated blood pressure and possible and diagnosed hypertension: -Started amlodipine 10 mg daily -IV hydralazine PRN -BP much improved  Diarrhea:. -C. difficile PCR neg -GI pathogen panel pending - One BM today  Protein-calorie malnutrition, severe ([place]): lost 20 LBs over 3 month.  -Consult to nutrition team  Bilateral leg edema: Unclear etiology, differential diagnosis includes renal failure and CHF 2/2 uncontroled HTN. Liver Fx OK. -Improved after foley placed  DVT ppx: heparin subQ while admitted  Code Status: Full Family  Communication: Pt in room Disposition Plan: Uncertain at this time   Consultants:  nephrology  Procedures:    Antibiotics: Anti-infectives    None      HPI/Subjective: Reports feeling better. Denies sob  Objective: Filed Vitals:   07/09/15 0033 07/09/15 0500 07/09/15 0503 07/09/15 1503  BP: 126/69 130/69  127/71  Pulse: 74 76  84  Temp:  98.3 F (36.8 C)  98.7 F (37.1 C)  TempSrc:  Oral  Oral  Resp:  18  19  Height:      Weight:   58.922 kg (129 lb 14.4 oz)   SpO2:  99%  99%    Intake/Output Summary (Last 24 hours) at 07/09/15 1724 Last data filed at 07/09/15 0700  Gross per 24 hour  Intake 2330.83 ml  Output   4700 ml  Net -2369.17 ml   Filed Weights   07/07/15 1912 07/08/15 1005 07/09/15 0503  Weight: 62.596 kg (138 lb) 59.421 kg (131 lb) 58.922 kg (129 lb 14.4 oz)    Exam:   General:  Awake, in nad, laying in bed  Cardiovascular: regular, s1, s2  Respiratory: normal resp effort, no wheezing  Abdomen: soft, nondistended  Musculoskeletal: perfused, no clubbing, no cyanosis  Data Reviewed: Basic Metabolic Panel:  Recent Labs Lab 07/07/15 2306 07/08/15 0115 07/08/15 0440 07/08/15 1247 07/09/15 0703 07/09/15 1505  NA  --  141 144 146* 144 143  K  --  6.2* 5.4* 4.8 3.8 4.0  CL  --  113* 114* 114* 117* 114*  CO2  --  14* 13* 18* 15* 18*  GLUCOSE  --  242* 66 124* 96 122*  BUN  --  134* 130* 119* 99* 91*  CREATININE  --  8.06* 7.67* 6.87*  5.12* 4.72*  CALCIUM  --  9.3 9.6 8.8* 8.0* 7.9*  MG 2.0  --   --   --   --   --   PHOS  --   --  6.0*  --  4.6  --    Liver Function Tests:  Recent Labs Lab 07/05/15 1745 07/07/15 1946 07/08/15 0440 07/09/15 0703  AST 21 30  --   --   ALT 18 24  --   --   ALKPHOS 46 47  --   --   BILITOT 0.5 0.4  --   --   PROT 7.7 7.8  --   --   ALBUMIN 4.2 4.2 3.9 3.3*   No results for input(s): LIPASE, AMYLASE in the last 168 hours. No results for input(s): AMMONIA in the last 168  hours. CBC:  Recent Labs Lab 07/05/15 1801 07/07/15 1946 07/08/15 0115  WBC 3.9* 4.3 3.7*  NEUTROABS  --  2.8  --   HGB 8.4* 8.1* 7.2*  HCT 24.0* 23.4* 21.9*  MCV 81.9 80.7 80.8  PLT  --  211 195   Cardiac Enzymes:  Recent Labs Lab 07/08/15 0106  CKTOTAL 482*   BNP (last 3 results)  Recent Labs  07/08/15 0106  BNP 104.0*    ProBNP (last 3 results) No results for input(s): PROBNP in the last 8760 hours.  CBG: No results for input(s): GLUCAP in the last 168 hours.  Recent Results (from the past 240 hour(s))  C difficile quick scan w PCR reflex     Status: None   Collection Time: 07/08/15  4:40 PM  Result Value Ref Range Status   C Diff antigen NEGATIVE NEGATIVE Final   C Diff toxin NEGATIVE NEGATIVE Final   C Diff interpretation Negative for toxigenic C. difficile  Final     Studies: Dg Chest 2 View  07/07/2015  CLINICAL DATA:  Acute onset of abnormal blood tests. Initial encounter. EXAM: CHEST  2 VIEW COMPARISON:  None. FINDINGS: The lungs are well-aerated and clear. There is no evidence of focal opacification, pleural effusion or pneumothorax. The heart is normal in size; the mediastinal contour is within normal limits. No acute osseous abnormalities are seen. IMPRESSION: No acute cardiopulmonary process seen. Electronically Signed   By: Garald Balding M.D.   On: 07/07/2015 23:01   US Renal  07/08/2015  CLINICAL DATA:  Acute kidney injury EXAM: RENAL / URINARY TRACT ULTRASOUND COMPLETE COMPARISON:  None. FINDINGS: Right Kidney: Length: 11 cm. Borderline echogenic. High-grade hydronephrosis and upper hydroureter. No evidence of mass lesion. Left Kidney: Length: 12 cm. Borderline echogenic. High-grade hydronephrosis and hydroureter. No evidence of mass lesion. Bladder: Overdistended bladder with 1100 cc volume. The patient could only urinate 150 cc. Enlarged prostate with nodular component projecting into the bladder. Floating debris in the bladder. These results were  called by telephone at the time of interpretation on 07/08/2015 at 1:47 am to Dr. Ivor Costa , who verbally acknowledged these results. IMPRESSION: 1. Prostatomegaly with bladder outlet obstruction causing bilateral hydronephrosis. 2. Bladder debris, correlate with urinalysis. Electronically Signed   By: Monte Fantasia M.D.   On: 07/08/2015 01:47    Scheduled Meds: . amLODipine  10 mg Oral Daily  . feeding supplement (NEPRO CARB STEADY)  237 mL Oral TID BM  . heparin  5,000 Units Subcutaneous 3 times per day  . sodium chloride flush  3 mL Intravenous Q12H  . tamsulosin  0.4 mg Oral Daily   Continuous Infusions: .  lactated ringers 100 mL/hr at 07/09/15 [id number]    Principal Problem:   AKI (acute kidney injury) ([place]) Active Problems:   Normocytic anemia   Hyperkalemia   Elevated blood pressure   Renal failure   Diarrhea   Protein-calorie malnutrition, severe ([facility])   Bilateral leg edema   Joel [name], [facility] Pager [phone]. If 7PM-7AM, please contact night-coverage at [email or web address], password [facility] 07/09/2015, 5:24 PM  LOS: 1 day

## 2015-07-09 NOTE — Progress Notes (Signed)
Admit: 07/07/2015 LOS: 1  56M minimal medical care presenting with Acute vs Chronic Renal Failure and Bladder Outlet Obstruction and prostatomegaly with elevated PSA.    Subjective:  Cont high UOP, wow Labs improving  BP ok   03/17 0701 - 03/18 0700 In: 3377.4 [P.O.:697; I.V.:2440.4] Out: 9200 [Urine:9200]  Filed Weights   07/07/15 1912 07/08/15 1005 07/09/15 0503  Weight: 62.596 kg (138 lb) 59.421 kg (131 lb) 58.922 kg (129 lb 14.4 oz)    Scheduled Meds: . amLODipine  10 mg Oral Daily  . feeding supplement (NEPRO CARB STEADY)  237 mL Oral TID BM  . heparin  5,000 Units Subcutaneous 3 times per day  . sodium chloride flush  3 mL Intravenous Q12H  . tamsulosin  0.4 mg Oral Daily   Continuous Infusions: . lactated ringers     PRN Meds:.acetaminophen **OR** acetaminophen, hydrALAZINE, ondansetron **OR** ondansetron (ZOFRAN) IV, zolpidem  Current Labs: reviewed    Physical Exam:  Blood pressure 130/69, pulse 76, temperature 98.3 F (36.8 C), temperature source Oral, resp. rate 18, height 5\' 7"  (1.702 m), weight 58.922 kg (129 lb 14.4 oz), SpO2 99 %. NAD in chair RRR CTAB S/nt/nd Foley in place  Trace-1+ LEE Nonfocal NCAT  A 1. Acute vs Chronic Renal Failure 2/2 obstructiion / BOO with hydroureter and hydronephrosis 2. Prostate Enlargement and Bladder Outlet Obstruction 1. Flomax and CCB started 2. PSA very elevated high suspicion for malignancy 3. Hyperkalemia, resolved 4. Metabolic Acidosis, improving 5. HTN 6. Anemia, borderline microcytic  P 1. With acidosis and normal potassium c hange over to LR today 2. Will need urology eval for BOO and elevated PSA 3. Repeat labs again this evening   Pearson Grippe MD 07/09/2015, 9:47 AM   Recent Labs Lab 07/08/15 0440 07/08/15 1247 07/09/15 0703  NA 144 146* 144  K 5.4* 4.8 3.8  CL 114* 114* 117*  CO2 13* 18* 15*  GLUCOSE 66 124* 96  BUN 130* 119* 99*  CREATININE 7.67* 6.87* 5.12*  CALCIUM 9.6 8.8* 8.0*   PHOS 6.0*  --  4.6    Recent Labs Lab 07/05/15 1801 07/07/15 1946 07/08/15 0115  WBC 3.9* 4.3 3.7*  NEUTROABS  --  2.8  --   HGB 8.4* 8.1* 7.2*  HCT 24.0* 23.4* 21.9*  MCV 81.9 80.7 80.8  PLT  --  211 195

## 2015-07-10 ENCOUNTER — Inpatient Hospital Stay (HOSPITAL_COMMUNITY): Payer: Medicare HMO

## 2015-07-10 DIAGNOSIS — R609 Edema, unspecified: Secondary | ICD-10-CM

## 2015-07-10 LAB — RENAL FUNCTION PANEL
ANION GAP: 11 (ref 5–15)
Albumin: 2.8 g/dL — ABNORMAL LOW (ref 3.5–5.0)
BUN: 80 mg/dL — ABNORMAL HIGH (ref 6–20)
CHLORIDE: 114 mmol/L — AB (ref 101–111)
CO2: 19 mmol/L — AB (ref 22–32)
Calcium: 8 mg/dL — ABNORMAL LOW (ref 8.9–10.3)
Creatinine, Ser: 3.95 mg/dL — ABNORMAL HIGH (ref 0.61–1.24)
GFR, EST AFRICAN AMERICAN: 16 mL/min — AB (ref >60)
GFR, EST NON AFRICAN AMERICAN: 14 mL/min — AB (ref >60)
Glucose, Bld: 98 mg/dL (ref 65–99)
POTASSIUM: 4 mmol/L (ref 3.5–5.1)
Phosphorus: 3 mg/dL (ref 2.5–4.6)
Sodium: 144 mmol/L (ref 135–145)

## 2015-07-10 LAB — ECHOCARDIOGRAM COMPLETE
HEIGHTINCHES: 67 in
Weight: 2144 oz

## 2015-07-10 NOTE — Progress Notes (Signed)
Admit: 07/07/2015 LOS: 2  64M minimal medical care presenting with Acute vs Chronic Renal Failure and Bladder Outlet Obstruction and prostatomegaly with elevated PSA.    Subjective:  UOP high but less FEels well, eating well FOley in still  03/18 0701 - 03/19 0700 In: 1706.7 [I.V.:1706.7] Out: 4750 [Urine:4750]  Filed Weights   07/08/15 1005 07/09/15 0503 07/10/15 0517  Weight: 59.421 kg (131 lb) 58.922 kg (129 lb 14.4 oz) 60.782 kg (134 lb)    Scheduled Meds: . amLODipine  10 mg Oral Daily  . feeding supplement (NEPRO CARB STEADY)  237 mL Oral TID BM  . heparin  5,000 Units Subcutaneous 3 times per day  . sodium chloride flush  3 mL Intravenous Q12H  . tamsulosin  0.4 mg Oral Daily   Continuous Infusions:   PRN Meds:.acetaminophen **OR** acetaminophen, hydrALAZINE, ondansetron **OR** ondansetron (ZOFRAN) IV, zolpidem  Current Labs: reviewed    Physical Exam:  Blood pressure 125/65, pulse 81, temperature 98.5 F (36.9 C), temperature source Oral, resp. rate 16, height 5\' 7"  (1.702 m), weight 60.782 kg (134 lb), SpO2 100 %. NAD in chair RRR CTAB S/nt/nd Foley in place  Trace-1+ LEE Nonfocal NCAT  A 1. Acute vs Chronic Renal Failure 2/2 obstructiion / BOO with hydroureter and hydronephrosis --- Looks like strong acute component 2. Post obstruction diuresis -- lessening 3. Prostate Enlargement and Bladder Outlet Obstruction 1. Flomax and CCB started 2. PSA very elevated high suspicion for malignancy 4. Hyperkalemia, resolved 5. Metabolic Acidosis, improving 6. HTN 7. Anemia, borderline microcytic  P 1. Now taht eating well will stop IVFs 2. Will need urology eval for BOO and elevated PSA 3. Cont to follow Daily weights, Daily Renal Panel, Strict I/Os, Avoid nephrotoxins (NSAIDs, judicious IV Contrast)    Pearson Grippe MD 07/10/2015, 9:58 AM   Recent Labs Lab 07/08/15 0440  07/09/15 0703 07/09/15 1505 07/10/15 0600  NA 144  < > 144 143 144  K 5.4*  <  > 3.8 4.0 4.0  CL 114*  < > 117* 114* 114*  CO2 13*  < > 15* 18* 19*  GLUCOSE 66  < > 96 122* 98  BUN 130*  < > 99* 91* 80*  CREATININE 7.67*  < > 5.12* 4.72* 3.95*  CALCIUM 9.6  < > 8.0* 7.9* 8.0*  PHOS 6.0*  --  4.6  --  3.0  < > = values in this interval not displayed.  Recent Labs Lab 07/05/15 1801 07/07/15 1946 07/08/15 0115  WBC 3.9* 4.3 3.7*  NEUTROABS  --  2.8  --   HGB 8.4* 8.1* 7.2*  HCT 24.0* 23.4* 21.9*  MCV 81.9 80.7 80.8  PLT  --  211 195

## 2015-07-10 NOTE — Evaluation (Signed)
Occupational Therapy Evaluation Patient Details Name: Joel [name] MRN: [id number] DOB: 1941/02/01 Today's Date: 07/10/2015    History of Present Illness 75 y.o. male who hasn't seen a doctor in several years admitted with generalized weakness, body weight loss, diarrhea and bilateral leg edema. Dx of AKI. no PMH in chart.   Clinical Impression   Pt admitted with above. Pt independent with ADLs, PTA. Feel pt will benefit from acute OT to increase independence prior to d/c. Do not feel pt will need follow up OT upon d/c.     Follow Up Recommendations  No OT follow up;Supervision - Intermittent    Equipment Recommendations  3 in 1 bedside comode    Recommendations for Other Services       Precautions / Restrictions Precautions Precautions: Fall Restrictions Weight Bearing Restrictions: No      Mobility Bed Mobility Overal bed mobility: Needs Assistance Bed Mobility: Supine to Sit;Sit to Supine     Supine to sit: Supervision Sit to supine: Supervision      Transfers Overall transfer level: Needs assistance   Transfers: Sit to/from Stand Sit to Stand: Supervision              Balance    LOB 2x during ambulation and physical assist given.                                        ADL Overall ADL's : Needs assistance/impaired Eating/Feeding: Independent;Sitting                   Lower Body Dressing: Sit to/from stand;Set up;Supervision/safety Lower Body Dressing Details (indicate cue type and reason): not including management of catheter             Functional mobility during ADLs: Minimal assistance for ambulation  Comments: Educated on safety such as sitting for LB ADLs, rugs/items on floor, safe footwear. Discussed options for shower chair.       Vision     Perception     Praxis      Pertinent Vitals/Pain Pain Assessment: No/denies pain (reports tenderness in feet)     Hand Dominance     Extremity/Trunk  Assessment Upper Extremity Assessment Upper Extremity Assessment: Overall WFL for tasks assessed;Generalized weakness (slightly weak in shoulder flexors)   Lower Extremity Assessment Lower Extremity Assessment: Defer to PT evaluation       Communication Communication Communication: No difficulties   Cognition Arousal/Alertness: Awake/alert Behavior During Therapy: WFL for tasks assessed/performed Overall Cognitive Status: Within Functional Limits for tasks assessed                     General Comments       Exercises       Shoulder Instructions      Home Living Family/patient expects to be discharged to:: Private residence Living Arrangements: Spouse/significant other Available Help at Discharge: Family   Home Access: Stairs to enter Technical brewer of Steps: 5   Home Layout: One level     Bathroom Shower/Tub: Teacher, early years/pre: Standard     Home Equipment: None          Prior Functioning/Environment Level of Independence: Independent        Comments: works as Theatre manager Diagnosis: Generalized weakness;Other (comment) (decreased balance)   OT Problem List: Decreased strength;Impaired balance (sitting and/or standing);Decreased knowledge of  use of DME or AE;Decreased knowledge of precautions   OT Treatment/Interventions: Self-care/ADL training;DME and/or AE instruction;Patient/family education;Balance training;Therapeutic exercise;Therapeutic activities    OT Goals(Current goals can be found in the care plan section) Acute Rehab OT Goals Patient Stated Goal: not stated OT Goal Formulation: With patient Time For Goal Achievement: 07/17/15 Potential to Achieve Goals: Good ADL Goals Pt Will Perform Lower Body Dressing: sit to/from stand;with supervision (including gathering items) Pt Will Transfer to Toilet: with modified independence;ambulating;regular height toilet Pt Will Perform Tub/Shower Transfer: Tub  transfer;with supervision;ambulating;with set-up (shower seat versus 3 in 1)  OT Frequency: Min 2X/week   Barriers to D/C:            Co-evaluation              End of Session Equipment Utilized During Treatment: Gait belt  Activity Tolerance: Patient tolerated treatment well Patient left: in bed;with bed alarm set;with family/visitor present   Time: [id number] OT Time Calculation (min): 14 min Charges:  OT General Charges $OT Visit: 1 Procedure OT Evaluation $OT Eval Low Complexity: 1 Procedure G-CodesBenito Mccreedy OTR/L [id number] 07/10/2015, 4:03 PM

## 2015-07-10 NOTE — Progress Notes (Signed)
[facility] PROGRESS NOTE  Joel [name] [id number] DOB: 08-18-1940 DOA: 07/07/2015 PCP: No primary care provider on file.  HPI/Brief narrative 75 y.o. male without significant PMH, not seen by doctor for 20 years, who presents with generalized weakness, body weight loss, diarrhea and bilateral leg edema. In the ED, patient was found to have acute renal injury with creatinine 8.17 and BUN 132, anemia with hemoglobin 8.1 and MCV 80.7, potassium 6.5 without EKG changes, bicarbonate 17  Assessment/Plan: AKI (acute kidney injury) ([place]): US showed prostatomegaly with bladder outlet obstruction causing bilateral hydronephrosis with bladder debris. -Seems to be improving following placement of Foley cath  -Started Flomax on admit -PSA is elevated at just under 100, worrisome for prostate ca -Cont to follow renal function -Nephrology continues to follow  Prostatomegaly: as shown on US-renal. -Will need f/u with urology and likely need indwelling cath on eventual d/c -Flomax was started in ED -Not complaining of back pain -PSA just under 100  Normocytic anemia: Hemoglobin 8.1 on admission. Patient never had colonoscopy or EGD  -Pt reportedly already scheduled for outpatient colonoscopy. CEA is pending  Hyperkalemia: Potassium 6.5 without EKG change on presentation. This is most likely due to AKI -Treated with insulin and Kayexalate 30 g 1 by EDP  -Normalized  Elevated blood pressure and possible and diagnosed hypertension: -Started amlodipine 10 mg daily -IV hydralazine PRN -BP much improved  Diarrhea:. -C. difficile PCR neg -GI pathogen panel pending -One BM today  Protein-calorie malnutrition, severe ([place]): lost 20 LBs over 3 month. Suspect related to possible prostate cancer per above -Consulted to nutrition team  Bilateral leg edema: Unclear etiology, differential diagnosis includes renal failure and CHF 2/2 uncontroled HTN. Liver Fx OK. -Improved after foley  placed  DVT ppx: heparin subQ while admitted  Code Status: Full Family Communication: Pt in room Disposition Plan: Uncertain at this time   Consultants:  nephrology  Procedures:    Antibiotics: Anti-infectives    None      HPI/Subjective: States feeling better today  Objective: Filed Vitals:   07/09/15 2201 07/10/15 0516 07/10/15 0517 07/10/15 1349  BP: 119/72 125/65  116/66  Pulse: 95 81  80  Temp: 98.6 F (37 C) 98.5 F (36.9 C)  98.4 F (36.9 C)  TempSrc: Oral Oral  Oral  Resp: 18 16  16   Height:      Weight:   60.782 kg (134 lb)   SpO2: 99% 100%  100%    Intake/Output Summary (Last 24 hours) at 07/10/15 1505 Last data filed at 07/10/15 1343  Gross per 24 hour  Intake 1706.67 ml  Output   6550 ml  Net -4843.33 ml   Filed Weights   07/08/15 1005 07/09/15 0503 07/10/15 0517  Weight: 59.421 kg (131 lb) 58.922 kg (129 lb 14.4 oz) 60.782 kg (134 lb)    Exam:   General:  Awake, in nad, laying in bed, eating  Cardiovascular: regular, s1, s2  Respiratory: normal resp effort, no wheezing  Abdomen: soft, nondistended, pos BS  Musculoskeletal: perfused, no cyanosis  Data Reviewed: Basic Metabolic Panel:  Recent Labs Lab 07/07/15 2306  07/08/15 0440 07/08/15 1247 07/09/15 0703 07/09/15 1505 07/10/15 0600  NA  --   < > 144 146* 144 143 144  K  --   < > 5.4* 4.8 3.8 4.0 4.0  CL  --   < > 114* 114* 117* 114* 114*  CO2  --   < > 13* 18* 15* 18* 19*  GLUCOSE  --   < >  66 124* 96 122* 98  BUN  --   < > 130* 119* 99* 91* 80*  CREATININE  --   < > 7.67* 6.87* 5.12* 4.72* 3.95*  CALCIUM  --   < > 9.6 8.8* 8.0* 7.9* 8.0*  MG 2.0  --   --   --   --   --   --   PHOS  --   --  6.0*  --  4.6  --  3.0  < > = values in this interval not displayed. Liver Function Tests:  Recent Labs Lab 07/05/15 1745 07/07/15 1946 07/08/15 0440 07/09/15 0703 07/10/15 0600  AST 21 30  --   --   --   ALT 18 24  --   --   --   ALKPHOS 46 47  --   --   --    BILITOT 0.5 0.4  --   --   --   PROT 7.7 7.8  --   --   --   ALBUMIN 4.2 4.2 3.9 3.3* 2.8*   No results for input(s): LIPASE, AMYLASE in the last 168 hours. No results for input(s): AMMONIA in the last 168 hours. CBC:  Recent Labs Lab 07/05/15 1801 07/07/15 1946 07/08/15 0115  WBC 3.9* 4.3 3.7*  NEUTROABS  --  2.8  --   HGB 8.4* 8.1* 7.2*  HCT 24.0* 23.4* 21.9*  MCV 81.9 80.7 80.8  PLT  --  211 195   Cardiac Enzymes:  Recent Labs Lab 07/08/15 0106  CKTOTAL 482*   BNP (last 3 results)  Recent Labs  07/08/15 0106  BNP 104.0*    ProBNP (last 3 results) No results for input(s): PROBNP in the last 8760 hours.  CBG: No results for input(s): GLUCAP in the last 168 hours.  Recent Results (from the past 240 hour(s))  C difficile quick scan w PCR reflex     Status: None   Collection Time: 07/08/15  4:40 PM  Result Value Ref Range Status   C Diff antigen NEGATIVE NEGATIVE Final   C Diff toxin NEGATIVE NEGATIVE Final   C Diff interpretation Negative for toxigenic C. difficile  Final  Gastrointestinal Panel by PCR , Stool     Status: None   Collection Time: 07/08/15  4:40 PM  Result Value Ref Range Status   Campylobacter species NOT DETECTED NOT DETECTED Final   Plesimonas shigelloides NOT DETECTED NOT DETECTED Final   Salmonella species NOT DETECTED NOT DETECTED Final   Yersinia enterocolitica NOT DETECTED NOT DETECTED Final   Vibrio species NOT DETECTED NOT DETECTED Final   Vibrio cholerae NOT DETECTED NOT DETECTED Final   Enteroaggregative E coli (EAEC) NOT DETECTED NOT DETECTED Final   Enteropathogenic E coli (EPEC) NOT DETECTED NOT DETECTED Final   Enterotoxigenic E coli (ETEC) NOT DETECTED NOT DETECTED Final   Shiga like toxin producing E coli (STEC) NOT DETECTED NOT DETECTED Final   E. coli O157 NOT DETECTED NOT DETECTED Final   Shigella/Enteroinvasive E coli (EIEC) NOT DETECTED NOT DETECTED Final   Cryptosporidium NOT DETECTED NOT DETECTED Final    Cyclospora cayetanensis NOT DETECTED NOT DETECTED Final   Entamoeba histolytica NOT DETECTED NOT DETECTED Final   Giardia lamblia NOT DETECTED NOT DETECTED Final   Adenovirus F40/41 NOT DETECTED NOT DETECTED Final   Astrovirus NOT DETECTED NOT DETECTED Final   Norovirus GI/GII NOT DETECTED NOT DETECTED Final   Rotavirus A NOT DETECTED NOT DETECTED Final   Sapovirus (I, II, IV, and  V) NOT DETECTED NOT DETECTED Final     Studies: No results found.  Scheduled Meds: . amLODipine  10 mg Oral Daily  . feeding supplement (NEPRO CARB STEADY)  237 mL Oral TID BM  . heparin  5,000 Units Subcutaneous 3 times per day  . sodium chloride flush  3 mL Intravenous Q12H  . tamsulosin  0.4 mg Oral Daily   Continuous Infusions:    Principal Problem:   AKI (acute kidney injury) ([place]) Active Problems:   Normocytic anemia   Hyperkalemia   Elevated blood pressure   Renal failure   Diarrhea   Protein-calorie malnutrition, severe ([facility])   Bilateral leg edema   Joel [name], [facility] Pager [phone]. If 7PM-7AM, please contact night-coverage at [email or web address], password [facility] 07/10/2015, 3:05 PM  LOS: 2 days

## 2015-07-11 LAB — RENAL FUNCTION PANEL
ALBUMIN: 2.9 g/dL — AB (ref 3.5–5.0)
ANION GAP: 9 (ref 5–15)
BUN: 67 mg/dL — ABNORMAL HIGH (ref 6–20)
CHLORIDE: 113 mmol/L — AB (ref 101–111)
CO2: 20 mmol/L — ABNORMAL LOW (ref 22–32)
Calcium: 7.9 mg/dL — ABNORMAL LOW (ref 8.9–10.3)
Creatinine, Ser: 3.36 mg/dL — ABNORMAL HIGH (ref 0.61–1.24)
GFR calc Af Amer: 20 mL/min — ABNORMAL LOW (ref >60)
GFR calc non Af Amer: 17 mL/min — ABNORMAL LOW (ref >60)
GLUCOSE: 101 mg/dL — AB (ref 65–99)
PHOSPHORUS: 2.8 mg/dL (ref 2.5–4.6)
POTASSIUM: 3.9 mmol/L (ref 3.5–5.1)
Sodium: 142 mmol/L (ref 135–145)

## 2015-07-11 LAB — MAGNESIUM: Magnesium: 1.2 mg/dL — ABNORMAL LOW (ref 1.7–2.4)

## 2015-07-11 LAB — PROTEIN ELECTROPHORESIS, SERUM
A/G Ratio: 1.1 (ref 0.7–1.7)
Albumin ELP: 3.6 g/dL (ref 2.9–4.4)
Alpha-1-Globulin: 0.3 g/dL (ref 0.0–0.4)
Alpha-2-Globulin: 0.7 g/dL (ref 0.4–1.0)
BETA GLOBULIN: 0.9 g/dL (ref 0.7–1.3)
GAMMA GLOBULIN: 1.3 g/dL (ref 0.4–1.8)
Globulin, Total: 3.2 g/dL (ref 2.2–3.9)
Total Protein ELP: 6.8 g/dL (ref 6.0–8.5)

## 2015-07-11 LAB — GLUCOSE, CAPILLARY: Glucose-Capillary: 141 mg/dL — ABNORMAL HIGH (ref 65–99)

## 2015-07-11 MED ORDER — SODIUM CHLORIDE 0.9 % IV SOLN
250.0000 mg | Freq: Once | INTRAVENOUS | Status: AC
  Administered 2015-07-11: 250 mg via INTRAVENOUS
  Filled 2015-07-11 (×2): qty 20

## 2015-07-11 MED ORDER — SODIUM CHLORIDE 0.9 % IV BOLUS (SEPSIS)
500.0000 mL | Freq: Once | INTRAVENOUS | Status: AC
  Administered 2015-07-11: 500 mL via INTRAVENOUS

## 2015-07-11 NOTE — Progress Notes (Signed)
   07/11/15 1538  PT Visit Information  Last PT Received On 07/11/15  Assistance Needed +1  Reason Eval/Treat Not Completed Medical issues which prohibited therapy (rapid response called this pm. will hold therapy today. RN reports patient found unresponsive in chair.  )

## 2015-07-11 NOTE — Progress Notes (Signed)
Occupational Therapy Treatment Patient Details Name: Joel [name] MRN: [id number] DOB: [birth year] Today's Date: 07/11/2015    History of present illness 75 y.o. male who hasn't seen a doctor in several years admitted with generalized weakness, body weight loss, diarrhea and bilateral leg edema. Dx of AKI. no PMH in chart.   OT comments  Completed education with pt regarding use of 3 in1 in tub for bathing and reducing risk of falls at home. Written handout on reducing fall risk given and reviewed. OT goals met. Pt safe to D/C home when medically stable.   Follow Up Recommendations  No OT follow up;Supervision - Intermittent    Equipment Recommendations  3 in 1 bedside comode    Recommendations for Other Services      Precautions / Restrictions Precautions Precautions: None       Mobility Bed Mobility Overal bed mobility: Modified Independent                Transfers Overall transfer level: Modified independent                    Balance Overall balance assessment: No apparent balance deficits (not formally assessed) (able to complete ADL sit - stand without any LOB)                                 ADL                                         General ADL Comments: Completed ADL with set up . Educated pt on technique for tub tranfer using 3 in 1. Able to return demonstrate. Recommended pt install grab bars at tub. Educated pt on reducing risk of falls at home. Handout provided and reviewed. Alos recommended pt use reacher to retrieve items from florr and refrain from using step stools.                                      Cognition   Behavior During Therapy: WFL for tasks assessed/performed Overall Cognitive Status: Within Functional Limits for tasks assessed                                                 General Comments  Pt very receptive and appreciative of information    Pertinent  Vitals/ Pain       Pain Assessment: No/denies pain  Home Living                                          Prior Functioning/Environment              Frequency       Progress Toward Goals  OT Goals(current goals can now be found in the care plan section)  Progress towards OT goals: Goals met/education completed, patient discharged from OT  Acute Rehab OT Goals Patient Stated Goal: to go home OT Goal Formulation: With patient Time For Goal Achievement: 07/17/15 Potential to Achieve Goals: Good ADL Goals Pt  Will Perform Lower Body Dressing: sit to/from stand;with supervision Pt Will Transfer to Toilet: with modified independence;ambulating;regular height toilet Pt Will Perform Tub/Shower Transfer: Tub transfer;with supervision;ambulating;with set-up  Plan All goals met and education completed, patient discharged from OT services    Co-evaluation                 End of Session     Activity Tolerance Patient tolerated treatment well   Patient Left in chair;with call bell/phone within reach   Nurse Communication Mobility status        Time: 1031-1046 OT Time Calculation (min): 15 min  Charges: OT General Charges $OT Visit: 1 Procedure OT Treatments $Self Care/Home Management : 8-22 mins  Arrow Tom[place],HILLARY 07/11/2015, 10:52 AM   [facility], OTR/L  [phone] 07/11/2015 [place], OTR/L  [phone] 07/11/2015

## 2015-07-11 NOTE — Progress Notes (Signed)
Current BP 104/56. Continuing to monitor pt frequently. BP cycles Q66min. Pt A&O. Bed remains in lowest position, call bell/phone within reach. Will monitor. Family at bedside.

## 2015-07-11 NOTE — Progress Notes (Signed)
[facility] PROGRESS NOTE  Joel [name] [id number] DOB: [birth year] DOA: 07/07/2015 PCP: No primary care provider on file.  HPI/Brief narrative 75 y.o. male without significant PMH, not seen by doctor for 20 years, who presents with generalized weakness, body weight loss, diarrhea and bilateral leg edema. In the ED, patient was found to have acute renal injury with creatinine 8.17 and BUN 132, anemia with hemoglobin 8.1 and MCV 80.7, potassium 6.5 without EKG changes, bicarbonate 17  Assessment/Plan: AKI (acute kidney injury) ([place]): US showed prostatomegaly with bladder outlet obstruction causing bilateral hydronephrosis with bladder debris. -Renal function improving following placement of Foley cath  -Patient was started on Flomax on admit -PSA is elevated at just under 100, worrisome for prostate ca. Will need urology f/u -Cont to follow renal function -Nephrology continues to follow  Prostatomegaly: as shown on US-renal. -Will need f/u with urology and likely need indwelling cath on eventual d/c -Flomax was started in ED -Not complaining of back pain -PSA just under 100  Normocytic anemia: Hemoglobin 8.1 on admission. Patient never had colonoscopy or EGD  -Pt reportedly already scheduled for outpatient colonoscopy. CEA is pending  Hyperkalemia: Potassium 6.5 without EKG change on presentation. This is most likely due to AKI -Treated with insulin and Kayexalate 30 g 1 by EDP  -Normalized  Elevated blood pressure and possible and diagnosed hypertension: -Started amlodipine 10 mg daily -IV hydralazine PRN -BP much improved  Diarrhea:. -C. difficile PCR neg -GI pathogen panel pan-negative  Protein-calorie malnutrition, severe ([place]): lost 20 LBs over 3 month. Suspect related to possible prostate cancer per above -Consulted to nutrition team  Bilateral leg edema: Unclear etiology, differential diagnosis includes renal failure and CHF 2/2 uncontroled HTN. Liver Fx  OK. -Improved after foley placed  DVT ppx: heparin subQ while admitted  Code Status: Full Family Communication: Pt in room Disposition Plan: Uncertain at this time   Consultants:  nephrology  Procedures:    Antibiotics: Anti-infectives    None      HPI/Subjective: In good spirits. Denies sob  Objective: Filed Vitals:   07/10/15 1349 07/10/15 2128 07/11/15 0534 07/11/15 0942  BP: 116/66 115/65 107/67 120/70  Pulse: 80 91 70 75  Temp: 98.4 F (36.9 C) 99.1 F (37.3 C) 98.3 F (36.8 C)   TempSrc: Oral Oral Oral   Resp: 16 16 16    Height:      Weight:   55.974 kg (123 lb 6.4 oz)   SpO2: 100% 100% 100%     Intake/Output Summary (Last 24 hours) at 07/11/15 1310 Last data filed at 07/11/15 1031  Gross per 24 hour  Intake      0 ml  Output   5225 ml  Net  -5225 ml   Filed Weights   07/09/15 0503 07/10/15 0517 07/11/15 0534  Weight: 58.922 kg (129 lb 14.4 oz) 60.782 kg (134 lb) 55.974 kg (123 lb 6.4 oz)    Exam:   General:  Awake, sitting in chair, in nad  Cardiovascular: regular, s1, s2  Respiratory: normal resp effort, no wheezing  Abdomen: soft, nondistended, pos BS  Musculoskeletal: perfused, no cyanosis, no clubbing  Data Reviewed: Basic Metabolic Panel:  Recent Labs Lab 07/07/15 2306  07/08/15 0440 07/08/15 1247 07/09/15 0703 07/09/15 1505 07/10/15 0600 07/11/15 0714  NA  --   < > 144 146* 144 143 144 142  K  --   < > 5.4* 4.8 3.8 4.0 4.0 3.9  CL  --   < > 114* 114*  117* 114* 114* 113*  CO2  --   < > 13* 18* 15* 18* 19* 20*  GLUCOSE  --   < > 66 124* 96 122* 98 101*  BUN  --   < > 130* 119* 99* 91* 80* 67*  CREATININE  --   < > 7.67* 6.87* 5.12* 4.72* 3.95* 3.36*  CALCIUM  --   < > 9.6 8.8* 8.0* 7.9* 8.0* 7.9*  MG 2.0  --   --   --   --   --   --   --   PHOS  --   --  6.0*  --  4.6  --  3.0 2.8  < > = values in this interval not displayed. Liver Function Tests:  Recent Labs Lab 07/05/15 1745 07/07/15 1946 07/08/15 0440  07/09/15 0703 07/10/15 0600 07/11/15 0714  AST 21 30  --   --   --   --   ALT 18 24  --   --   --   --   ALKPHOS 46 47  --   --   --   --   BILITOT 0.5 0.4  --   --   --   --   PROT 7.7 7.8  --   --   --   --   ALBUMIN 4.2 4.2 3.9 3.3* 2.8* 2.9*   No results for input(s): LIPASE, AMYLASE in the last 168 hours. No results for input(s): AMMONIA in the last 168 hours. CBC:  Recent Labs Lab 07/05/15 1801 07/07/15 1946 07/08/15 0115  WBC 3.9* 4.3 3.7*  NEUTROABS  --  2.8  --   HGB 8.4* 8.1* 7.2*  HCT 24.0* 23.4* 21.9*  MCV 81.9 80.7 80.8  PLT  --  211 195   Cardiac Enzymes:  Recent Labs Lab 07/08/15 0106  CKTOTAL 482*   BNP (last 3 results)  Recent Labs  07/08/15 0106  BNP 104.0*    ProBNP (last 3 results) No results for input(s): PROBNP in the last 8760 hours.  CBG: No results for input(s): GLUCAP in the last 168 hours.  Recent Results (from the past 240 hour(s))  C difficile quick scan w PCR reflex     Status: None   Collection Time: 07/08/15  4:40 PM  Result Value Ref Range Status   C Diff antigen NEGATIVE NEGATIVE Final   C Diff toxin NEGATIVE NEGATIVE Final   C Diff interpretation Negative for toxigenic C. difficile  Final  Gastrointestinal Panel by PCR , Stool     Status: None   Collection Time: 07/08/15  4:40 PM  Result Value Ref Range Status   Campylobacter species NOT DETECTED NOT DETECTED Final   Plesimonas shigelloides NOT DETECTED NOT DETECTED Final   Salmonella species NOT DETECTED NOT DETECTED Final   Yersinia enterocolitica NOT DETECTED NOT DETECTED Final   Vibrio species NOT DETECTED NOT DETECTED Final   Vibrio cholerae NOT DETECTED NOT DETECTED Final   Enteroaggregative E coli (EAEC) NOT DETECTED NOT DETECTED Final   Enteropathogenic E coli (EPEC) NOT DETECTED NOT DETECTED Final   Enterotoxigenic E coli (ETEC) NOT DETECTED NOT DETECTED Final   Shiga like toxin producing E coli (STEC) NOT DETECTED NOT DETECTED Final   E. coli O157 NOT  DETECTED NOT DETECTED Final   Shigella/Enteroinvasive E coli (EIEC) NOT DETECTED NOT DETECTED Final   Cryptosporidium NOT DETECTED NOT DETECTED Final   Cyclospora cayetanensis NOT DETECTED NOT DETECTED Final   Entamoeba histolytica NOT DETECTED NOT DETECTED  Final   Giardia lamblia NOT DETECTED NOT DETECTED Final   Adenovirus F40/41 NOT DETECTED NOT DETECTED Final   Astrovirus NOT DETECTED NOT DETECTED Final   Norovirus GI/GII NOT DETECTED NOT DETECTED Final   Rotavirus A NOT DETECTED NOT DETECTED Final   Sapovirus (I, II, IV, and V) NOT DETECTED NOT DETECTED Final     Studies: No results found.  Scheduled Meds: . amLODipine  10 mg Oral Daily  . feeding supplement (NEPRO CARB STEADY)  237 mL Oral TID BM  . ferric gluconate (FERRLECIT/NULECIT) IV  250 mg Intravenous Once  . heparin  5,000 Units Subcutaneous 3 times per day  . sodium chloride flush  3 mL Intravenous Q12H  . tamsulosin  0.4 mg Oral Daily   Continuous Infusions:    Principal Problem:   AKI (acute kidney injury) ([place]) Active Problems:   Normocytic anemia   Hyperkalemia   Elevated blood pressure   Renal failure   Diarrhea   Protein-calorie malnutrition, severe (HCC)   Bilateral leg edema   Joel [name], [place] [facility] Pager [phone]. If 7PM-7AM, please contact night-coverage at [email or web address], password [facility] 07/11/2015, 1:10 PM  LOS: 3 days

## 2015-07-11 NOTE — Progress Notes (Signed)
   [facility] Progress Note   Assessment/ Plan:   1. Acute Kidney Injury (most likely superimposed on chronic kidney disease-stage unclear given paucity of baseline data): Acute component is most likely from bladder outlet obstruction with hydroureter/hydronephrosis that is now alleviated by placement of Foley catheter-good urine output with slow recovery of renal function. The degree to which his renal function recovers will be determined by the unknown duration of which he had obstruction. 2. Prostatomegaly with elevated PSA: Likely the etiology of his bladder outlet obstruction/hydronephrosis-to follow up with urology for definitive/additional management. 3. Hyperkalemia/metabolic acidosis: Resolving/improving with renal clearance/urine output 4. Anemia: Borderline microcytic, will give a single dose of intravenous iron and ESA thereafter.  Subjective:   Reports to be feeling well-denies any pain or discomfort and does not have any shortness of breath. Reports dysgeusia is now absent.    Objective:   BP 120/70 mmHg  Pulse 75  Temp(Src) 98.3 F (36.8 C) (Oral)  Resp 16  Ht 5\' 7"  (1.702 m)  Wt 55.974 kg (123 lb 6.4 oz)  BMI 19.32 kg/m2  SpO2 100%  Intake/Output Summary (Last 24 hours) at 07/11/15 1225 Last data filed at 07/11/15 1031  Gross per 24 hour  Intake      0 ml  Output   5225 ml  Net  -5225 ml   Weight change: -4.808 kg (-10 lb 9.6 oz)  Physical Exam: Gen: Comfortably sitting up in recliner, wife at bedside CVS: Pulse regular in rate and rhythm, S1 and S2 normal Resp: Clear to auscultation, no rales/rhonchi Abd: Soft, flat, nontender Ext: No lower extremity edema  Imaging: No results found.  Labs: BMET  Recent Labs Lab 07/08/15 0115 07/08/15 0440 07/08/15 1247 07/09/15 0703 07/09/15 1505 07/10/15 0600 07/11/15 0714  NA 141 144 146* 144 143 144 142  K 6.2* 5.4* 4.8 3.8 4.0 4.0 3.9  CL 113* 114* 114* 117* 114* 114* 113*  CO2 14* 13* 18*  15* 18* 19* 20*  GLUCOSE 242* 66 124* 96 122* 98 101*  BUN 134* 130* 119* 99* 91* 80* 67*  CREATININE 8.06* 7.67* 6.87* 5.12* 4.72* 3.95* 3.36*  CALCIUM 9.3 9.6 8.8* 8.0* 7.9* 8.0* 7.9*  PHOS  --  6.0*  --  4.6  --  3.0 2.8   CBC  Recent Labs Lab 07/05/15 1801 07/07/15 1946 07/08/15 0115  WBC 3.9* 4.3 3.7*  NEUTROABS  --  2.8  --   HGB 8.4* 8.1* 7.2*  HCT 24.0* 23.4* 21.9*  MCV 81.9 80.7 80.8  PLT  --  211 195    Medications:    . amLODipine  10 mg Oral Daily  . feeding supplement (NEPRO CARB STEADY)  237 mL Oral TID BM  . heparin  5,000 Units Subcutaneous 3 times per day  . sodium chloride flush  3 mL Intravenous Q12H  . tamsulosin  0.4 mg Oral Daily   Elmarie Shiley, MD 07/11/2015, 12:25 PM

## 2015-07-11 NOTE — Care Management Important Message (Signed)
Important Message  Patient Details  Name: Joel [name] MRN: [id number] Date of Birth: 01/20/1941   Medicare Important Message Given:  Yes    Barb Merino Cheng Dec 07/11/2015, 4:30 PM

## 2015-07-11 NOTE — Clinical Documentation Improvement (Signed)
Hospitalist Please update your documentation within the medical record to reflect your response to this query. Thank you  Can the diagnosis of CKD be further specified?   CKD Stage I - GFR greater than or equal to 90  CKD Stage II - GFR 60-89  CKD Stage III - GFR 30-59  CKD Stage IV - GFR 15-29  CKD Stage V - GFR < 15  ESRD (End Stage Renal Disease)  Other condition  Unable to clinically determine  Supporting Information: : (risk factors, signs and symptoms, diagnostics, treatment) 07/10/15 Nephro consult note "30M minimal medical care presenting with Acute vs Chronic Renal Failure and Bladder Outlet Obstruction and prostatomegaly with elevated PSA." 07/10/15 Progr note.Marland KitchenMarland Kitchen"Elevated blood pressure and possible and diagnosed hypertension: -Started amlodipine 10 mg daily -IV hydralazine PRN-BP much improved" Results for MENACHEM, URBANEK (MRN [id number]) as of 07/11/2015 06:10  07/08/2015 04:40 07/08/2015 12:47 07/09/2015 07:03 07/09/2015 15:05 07/10/2015 06:00  EGFR (African American) 7 (L) 8 (L) 12 (L) 13 (L) 16 (L)   Please exercise your independent, professional judgment when responding. A specific answer is not anticipated or expected.  Thank You, Ermelinda Das, RN, BSN, [place] Certified Clinical Documentation Specialist [place]: Health Information Management [phone]

## 2015-07-11 NOTE — Progress Notes (Addendum)
Director in pts room during rounds, pt lost consciousness twice. Episode lasted seconds, then pt would become A&O. Rapid Response called. BP 96/56 heart rate 72. Dr Wyline Copas notified. 500cc Bolus given. Pt now A&OX4. Will continue to monitor pt frequently. Bed alarm on. Bed placed in lowest position and call bell is within reach. Family member at bedside.

## 2015-07-11 NOTE — Significant Event (Signed)
Rapid Response Event Note  Overview:  Called to assist with patient with syncopal episode Time Called: 1525 Arrival Time: 1527 Event Type: Hypotension  Initial Focused Assessment:  On arrival patient supine in bed - alert - warm and dry - neuro intact - speech clear - states he had sudden feeling "I was passing out".  Staff report he was sitting in chair with sudden onset intermittent unresponsiveness x 2.  It was described as brief with upward gaze - no signs of abnormal limb movement - has foley but no bowel incontinence - they state when he came back around he was instantly alert - denies CP or SOB - no diaphoresis.  BP was 96/56 HR 76 - no arrythmias noted on tele - RR 16 O2 sats 100%.  Patient admitted with AKI - bp on admission was elevated - now with new BP meds.  CBG 179.  Bil BS = and clear.    Interventions:  Review of chart - new IV start left forearm #20 angio times one attempt -  - right antecubital IV no blood return - slight swelling with flush - d/c'd per staff RN Caren Griffins - NS bolus started - Dr. Wyline Copas on phone stated to given 500 cc NS.  Patient remains alert - pain free - no further episodes.  BP responding to fluids - 102/60 HR 76  And 109/62 HR 74.  Handoff to [employer] - to call as needed.     Event Summary: Name of Physician Notified: Dr. Wyline Copas at  (pta RRT)    at    Outcome: Stayed in room and stabalized  Event End Time: [place]  Quin Hoop

## 2015-07-12 LAB — RENAL FUNCTION PANEL
Albumin: 2.9 g/dL — ABNORMAL LOW (ref 3.5–5.0)
Anion gap: 12 (ref 5–15)
BUN: 67 mg/dL — AB (ref 6–20)
CALCIUM: 8.1 mg/dL — AB (ref 8.9–10.3)
CHLORIDE: 112 mmol/L — AB (ref 101–111)
CO2: 19 mmol/L — ABNORMAL LOW (ref 22–32)
CREATININE: 3.05 mg/dL — AB (ref 0.61–1.24)
GFR calc non Af Amer: 19 mL/min — ABNORMAL LOW (ref >60)
GFR, EST AFRICAN AMERICAN: 22 mL/min — AB (ref >60)
Glucose, Bld: 96 mg/dL (ref 65–99)
Phosphorus: 2.9 mg/dL (ref 2.5–4.6)
Potassium: 3.9 mmol/L (ref 3.5–5.1)
Sodium: 143 mmol/L (ref 135–145)

## 2015-07-12 MED ORDER — ALBUMIN HUMAN 25 % IV SOLN
25.0000 g | Freq: Once | INTRAVENOUS | Status: AC
  Administered 2015-07-12: 25 g via INTRAVENOUS
  Filled 2015-07-12: qty 50

## 2015-07-12 MED ORDER — MAGNESIUM SULFATE 2 GM/50ML IV SOLN
2.0000 g | Freq: Once | INTRAVENOUS | Status: AC
  Administered 2015-07-12: 2 g via INTRAVENOUS
  Filled 2015-07-12: qty 50

## 2015-07-12 NOTE — Progress Notes (Signed)
Physical Therapy Treatment Patient Details Name: Joel [name] MRN: [id number] DOB: [birth year] Today's Date: 2015-08-08    History of Present Illness 75 y.o. male who hasn't seen a doctor in several years admitted with generalized weakness, body weight loss, diarrhea and bilateral leg edema. Dx of AKI. no PMH in chart.    PT Comments    Pt performed increased gait distance and successful completion of stair negotiation.  Pt impulsive but no LOB noted.    Follow Up Recommendations  No PT follow up     Equipment Recommendations  Cane    Recommendations for Other Services       Precautions / Restrictions Precautions Precautions: None Precaution Comments: no falls in past year per pt and wife Restrictions Weight Bearing Restrictions: No    Mobility  Bed Mobility Overal bed mobility:  (Pt sitting in chair on arrival.  )                Transfers Overall transfer level: Modified independent Equipment used: None Transfers: Sit to/from Stand Sit to Stand: Modified independent (Device/Increase time)         General transfer comment: Pt performed impulsively, donned belt in standing.    Ambulation/Gait Ambulation/Gait assistance: Supervision Ambulation Distance (Feet): 450 Feet Assistive device: None Gait Pattern/deviations: Narrow base of support;Decreased stride length;Trunk flexed     General Gait Details: Cues to increase B step length, no scissoring noted.     Stairs Stairs: Yes   Stair Management: No rails (with L HHA.  ) Number of Stairs: 5 General stair comments: Cues for sequencing.  Pt performed forward non-reciprocal pattern.   Wheelchair Mobility    Modified Rankin (Stroke Patients Only)       Balance     Sitting balance-Leahy Scale: Normal       Standing balance-Leahy Scale: Good                      Cognition Arousal/Alertness: Awake/alert Behavior During Therapy: WFL for tasks assessed/performed Overall Cognitive  Status: Within Functional Limits for tasks assessed                      Exercises      General Comments        Pertinent Vitals/Pain Pain Assessment: No/denies pain    Home Living                      Prior Function            PT Goals (current goals can now be found in the care plan section) Acute Rehab PT Goals Patient Stated Goal: to go home Potential to Achieve Goals: Good Progress towards PT goals: Progressing toward goals    Frequency  Min 3X/week    PT Plan      Co-evaluation             End of Session Equipment Utilized During Treatment: Gait belt Activity Tolerance: Patient tolerated treatment well Patient left: in chair;with family/visitor present;with call bell/phone within reach     Time: 1650-1705 PT Time Calculation (min) (ACUTE ONLY): 15 min  Charges:  $Gait Training: 8-22 mins                    G Codes:      Cristela Blue 08-08-15, 5:13 PM  Governor Rooks, PTA pager [phone]

## 2015-07-12 NOTE — Progress Notes (Signed)
  [facility] Progress Note   Assessment/ Plan:   1. Acute Kidney Injury (most likely superimposed on chronic kidney disease-stage unclear given paucity of baseline data): Acute component is most likely from bladder outlet obstruction with hydroureter/hydronephrosis that is now alleviated by placement of Foley catheter. The degree to which his renal function recovers will be determined by the unknown duration of which he had obstruction. Renal function showing slow but gradual recovery  from labs. Will give intravenous fluids as he may indeed be intravascularly contracted from polyuric urine output since alleviating his obstruction.  2. Prostatomegaly with elevated PSA: indicative of prostate cancer-this was the cause of this bladder outlet obstruction and will be addressed by urology with outpatient follow-up.  3. Hyperkalemia/metabolic acidosis: corrected with alleviation of obstruction and improving renal function. 4. Anemia: Borderline microcytic, will give a single dose of intravenous iron and ESA thereafter. 5. Hypotension/Syncope/dizziness: Appears to be likely from volume depletion secondary to polyuric urine output and inability to compensate for this via oral intake-will give intravenous fluids.   Subjective:   Rapid response called yesterday afternoon after he suffered a series of brief syncopal events. Found to be hypotensive and given fluid bolus.    Objective:   BP 94/57 mmHg  Pulse 75  Temp(Src) 98.3 F (36.8 C) (Oral)  Resp 16  Ht 5\' 7"  (1.702 m)  Wt 55.384 kg (122 lb 1.6 oz)  BMI 19.12 kg/m2  SpO2 100%  Intake/Output Summary (Last 24 hours) at 07/12/15 1130 Last data filed at 07/12/15 1038  Gross per 24 hour  Intake    700 ml  Output   3875 ml  Net  -3175 ml   Weight change: -0.59 kg (-1 lb 4.8 oz)  Physical Exam: Gen: Comfortably sitting up in recliner CVS: Pulse regular in rate and rhythm, S1 and S2 normal Resp: Clear to auscultation, no  rales/rhonchi Abd: Soft, flat, nontender Ext: No lower extremity edema  Imaging: No results found.  Labs: BMET  Recent Labs Lab 07/08/15 0440 07/08/15 1247 07/09/15 0703 07/09/15 1505 07/10/15 0600 07/11/15 0714 07/12/15 0515  NA 144 146* 144 143 144 142 143  K 5.4* 4.8 3.8 4.0 4.0 3.9 3.9  CL 114* 114* 117* 114* 114* 113* 112*  CO2 13* 18* 15* 18* 19* 20* 19*  GLUCOSE 66 124* 96 122* 98 101* 96  BUN 130* 119* 99* 91* 80* 67* 67*  CREATININE 7.67* 6.87* 5.12* 4.72* 3.95* 3.36* 3.05*  CALCIUM 9.6 8.8* 8.0* 7.9* 8.0* 7.9* 8.1*  PHOS 6.0*  --  4.6  --  3.0 2.8 2.9   CBC  Recent Labs Lab 07/05/15 1801 07/07/15 1946 07/08/15 0115  WBC 3.9* 4.3 3.7*  NEUTROABS  --  2.8  --   HGB 8.4* 8.1* 7.2*  HCT 24.0* 23.4* 21.9*  MCV 81.9 80.7 80.8  PLT  --  211 195    Medications:    . feeding supplement (NEPRO CARB STEADY)  237 mL Oral TID BM  . heparin  5,000 Units Subcutaneous 3 times per day  . sodium chloride flush  3 mL Intravenous Q12H  . tamsulosin  0.4 mg Oral Daily   Elmarie Shiley, MD 07/12/2015, 11:30 AM

## 2015-07-12 NOTE — Progress Notes (Signed)
[facility] PROGRESS NOTE  Joel [name] [id number] DOB: March 01, 1941 DOA: 07/07/2015 PCP: No primary care provider on file.  HPI/Brief narrative 75 y.o. male without significant PMH, not seen by doctor for 20 years, who presents with generalized weakness, body weight loss, diarrhea and bilateral leg edema. In the ED, patient was found to have acute renal injury with creatinine 8.17 and BUN 132, anemia with hemoglobin 8.1 and MCV 80.7, potassium 6.5 without EKG changes, bicarbonate 17  Assessment/Plan: AKI (acute kidney injury) ([place]): US showed prostatomegaly with bladder outlet obstruction causing bilateral hydronephrosis with bladder debris. -Renal function improving following placement of Foley cath  -Patient was started on Flomax on admit -PSA is elevated at just under 100, worrisome for prostate ca. Will need urology f/u - see below -Cont to follow renal function -Nephrology continues to follow -Cr continuing to trend down - currently 3.05  Prostatomegaly: as shown on US-renal. -Will need f/u with urology and likely need indwelling cath. I have called [facility] who will contact patient towards end of week to schedule appt, assuming he would be discharged by that time -Flomax was started in ED -Not complaining of back pain -PSA just under 100  Normocytic anemia: Hemoglobin 8.1 on admission. Patient never had colonoscopy or EGD  -Pt reportedly already scheduled for outpatient colonoscopy. CEA still pending  Hyperkalemia: Potassium 6.5 without EKG change on presentation. This is most likely due to AKI -Treated with insulin and Kayexalate 30 g 1 by EDP  -Normalized  Elevated blood pressure and possible and diagnosed hypertension: -Started amlodipine 10 mg daily -IV hydralazine PRN -BP much improved  Diarrhea:. -C. difficile PCR neg -GI pathogen panel pan-negative  Protein-calorie malnutrition, severe ([place]): lost 20 LBs over 3 month. Suspect related to  possible prostate cancer per above -Consulted to nutrition team  Bilateral leg edema: Unclear etiology, differential diagnosis includes renal failure and CHF 2/2 uncontroled HTN. Liver Fx OK. -Improved after foley placed  DVT ppx: heparin subQ while admitted  Hypomagnesemia - Mg of 1.2. Will replace  Code Status: Full Family Communication: Pt in room Disposition Plan: Possible in the next 48-72hrs   Consultants:  nephrology  Procedures:    Antibiotics: Anti-infectives    None      HPI/Subjective: Reports feeling well  Objective: Filed Vitals:   07/12/15 0446 07/12/15 0450 07/12/15 0658 07/12/15 1100  BP:   113/68 94/57  Pulse: 69  70 75  Temp: 98.1 F (36.7 C)  98.3 F (36.8 C)   TempSrc: Oral  Oral   Resp:   16   Height:      Weight:  55.384 kg (122 lb 1.6 oz)    SpO2: 100%  100%     Intake/Output Summary (Last 24 hours) at 07/12/15 1134 Last data filed at 07/12/15 1038  Gross per 24 hour  Intake    700 ml  Output   3875 ml  Net  -3175 ml   Filed Weights   07/10/15 0517 07/11/15 0534 07/12/15 0450  Weight: 60.782 kg (134 lb) 55.974 kg (123 lb 6.4 oz) 55.384 kg (122 lb 1.6 oz)    Exam:   General:  Awake, sitting in chair, in nad  Cardiovascular: regular, s1, s2  Respiratory: normal resp effort, no wheezing  Abdomen: soft, nondistended  Musculoskeletal: perfused, no cyanosis, no clubbing  Data Reviewed: Basic Metabolic Panel:  Recent Labs Lab 07/07/15 2306  07/08/15 0440  07/09/15 0703 07/09/15 1505 07/10/15 0600 07/11/15 0714 07/11/15 1619 07/12/15 0515  NA  --   < >  144  < > 144 143 144 142  --  143  K  --   < > 5.4*  < > 3.8 4.0 4.0 3.9  --  3.9  CL  --   < > 114*  < > 117* 114* 114* 113*  --  112*  CO2  --   < > 13*  < > 15* 18* 19* 20*  --  19*  GLUCOSE  --   < > 66  < > 96 122* 98 101*  --  96  BUN  --   < > 130*  < > 99* 91* 80* 67*  --  67*  CREATININE  --   < > 7.67*  < > 5.12* 4.72* 3.95* 3.36*  --  3.05*  CALCIUM   --   < > 9.6  < > 8.0* 7.9* 8.0* 7.9*  --  8.1*  MG 2.0  --   --   --   --   --   --   --  1.2*  --   PHOS  --   --  6.0*  --  4.6  --  3.0 2.8  --  2.9  < > = values in this interval not displayed. Liver Function Tests:  Recent Labs Lab 07/05/15 1745 07/07/15 1946 07/08/15 0440 07/09/15 0703 07/10/15 0600 07/11/15 0714 07/12/15 0515  AST 21 30  --   --   --   --   --   ALT 18 24  --   --   --   --   --   ALKPHOS 46 47  --   --   --   --   --   BILITOT 0.5 0.4  --   --   --   --   --   PROT 7.7 7.8  --   --   --   --   --   ALBUMIN 4.2 4.2 3.9 3.3* 2.8* 2.9* 2.9*   No results for input(s): LIPASE, AMYLASE in the last 168 hours. No results for input(s): AMMONIA in the last 168 hours. CBC:  Recent Labs Lab 07/05/15 1801 07/07/15 1946 07/08/15 0115  WBC 3.9* 4.3 3.7*  NEUTROABS  --  2.8  --   HGB 8.4* 8.1* 7.2*  HCT 24.0* 23.4* 21.9*  MCV 81.9 80.7 80.8  PLT  --  211 195   Cardiac Enzymes:  Recent Labs Lab 07/08/15 0106  CKTOTAL 482*   BNP (last 3 results)  Recent Labs  07/08/15 0106  BNP 104.0*    ProBNP (last 3 results) No results for input(s): PROBNP in the last 8760 hours.  CBG:  Recent Labs Lab 07/11/15 1524  GLUCAP 141*    Recent Results (from the past 240 hour(s))  C difficile quick scan w PCR reflex     Status: None   Collection Time: 07/08/15  4:40 PM  Result Value Ref Range Status   C Diff antigen NEGATIVE NEGATIVE Final   C Diff toxin NEGATIVE NEGATIVE Final   C Diff interpretation Negative for toxigenic C. difficile  Final  Gastrointestinal Panel by PCR , Stool     Status: None   Collection Time: 07/08/15  4:40 PM  Result Value Ref Range Status   Campylobacter species NOT DETECTED NOT DETECTED Final   Plesimonas shigelloides NOT DETECTED NOT DETECTED Final   Salmonella species NOT DETECTED NOT DETECTED Final   Yersinia enterocolitica NOT DETECTED NOT DETECTED Final   Vibrio species NOT DETECTED NOT  DETECTED Final   Vibrio  cholerae NOT DETECTED NOT DETECTED Final   Enteroaggregative E coli (EAEC) NOT DETECTED NOT DETECTED Final   Enteropathogenic E coli (EPEC) NOT DETECTED NOT DETECTED Final   Enterotoxigenic E coli (ETEC) NOT DETECTED NOT DETECTED Final   Shiga like toxin producing E coli (STEC) NOT DETECTED NOT DETECTED Final   E. coli O157 NOT DETECTED NOT DETECTED Final   Shigella/Enteroinvasive E coli (EIEC) NOT DETECTED NOT DETECTED Final   Cryptosporidium NOT DETECTED NOT DETECTED Final   Cyclospora cayetanensis NOT DETECTED NOT DETECTED Final   Entamoeba histolytica NOT DETECTED NOT DETECTED Final   Giardia lamblia NOT DETECTED NOT DETECTED Final   Adenovirus F40/41 NOT DETECTED NOT DETECTED Final   Astrovirus NOT DETECTED NOT DETECTED Final   Norovirus GI/GII NOT DETECTED NOT DETECTED Final   Rotavirus A NOT DETECTED NOT DETECTED Final   Sapovirus (I, II, IV, and V) NOT DETECTED NOT DETECTED Final     Studies: No results found.  Scheduled Meds: . feeding supplement (NEPRO CARB STEADY)  237 mL Oral TID BM  . heparin  5,000 Units Subcutaneous 3 times per day  . sodium chloride flush  3 mL Intravenous Q12H  . tamsulosin  0.4 mg Oral Daily   Continuous Infusions:    Principal Problem:   AKI (acute kidney injury) ([place]) Active Problems:   Normocytic anemia   Hyperkalemia   Elevated blood pressure   Renal failure   Diarrhea   Protein-calorie malnutrition, severe ([facility])   Bilateral leg edema   Istvan Behar, [facility] Pager [phone]. If 7PM-7AM, please contact night-coverage at [email or web address], password [facility] 07/12/2015, 11:34 AM  LOS: 4 days

## 2015-07-12 NOTE — Progress Notes (Signed)
Nutrition Follow-up  DOCUMENTATION CODES:   Severe malnutrition in context of chronic illness  INTERVENTION:   -Continue Nepro Shake po TID, each supplement provides 425 kcal and 19 grams protein  NUTRITION DIAGNOSIS:   Malnutrition related to poor appetite, chronic illness as evidenced by percent weight loss, severe depletion of muscle mass, moderate depletion of body fat.  Ongoing  GOAL:   Weight gain  Progressing  MONITOR:   Supplement acceptance, Labs, Weight trends, PO intake  REASON FOR ASSESSMENT:   Consult Assessment of nutrition requirement/status  ASSESSMENT:   Joel [name] is a very active 75yo with no PMHx who presents today after his PCP sent him after abnormal kidney labs. He had not seen a doctor or had blood tests in many years. Patient has been having frequency and urgency over the past couple months. No gross hematuria. Patient reports he has had increased thirst and fluid intake. Patient reports over a 20lb weight loss over the past few months. He reports increasing fatigue and loss of appetite. He began drinking Ensure on Monday. His family reports that his voice has started to sound "weaker" over the past week. Patient states he has had an occasional lower abdominal pain that occurs multiple times per day, but not daily. Patient had 1 episode of bilious vomiting 2 weeks ago. Patient reports he has noticed his legs starting to swell over the past few months. He stated his legs started itching before they started to swell. Patient denies chest pain, shortness of breath, headache.  Per chart review, rapid response was called on 07/11/15 due to decline in consciousness.   Pt was sitting up in recliner at time of visit, watching TV. He reports his appetite continues to improve and he consumed 100% of breakfast this morning. Noted Nepro shake at bedside. Pt consumed about 50% of Nepro. Pt reports he enjoys the supplements and wound like to continue them. Reinforced  importance of good meal and supplement intake to promote healing.   Per MD notes, pt awaiting urology evaluation for bladder outlet obstruction and prostatomegaly with elevated PSA.   Labs reviewed.   Diet Order:  Diet renal with fluid restriction Fluid restriction:: Other (see comments); Room service appropriate?: Yes; Fluid consistency:: Thin  Skin:  Reviewed, no issues  Last BM:  07/12/15  Height:   Ht Readings from Last 1 Encounters:  07/08/15 5\' 7"  (1.702 m)    Weight:   Wt Readings from Last 1 Encounters:  07/12/15 122 lb 1.6 oz (55.384 kg)    Ideal Body Weight:  148 kg  BMI:  Body mass index is 19.12 kg/(m^2).  Estimated Nutritional Needs:   Kcal:  1700-1900  Protein:  80-95 grams  Fluid:  1.7-1.9 L  EDUCATION NEEDS:   No education needs identified at this time  Joel [name] A. Jimmye Norman, RD, LDN, CDE Pager: [phone] After hours Pager: [phone]

## 2015-07-13 DIAGNOSIS — R197 Diarrhea, unspecified: Secondary | ICD-10-CM

## 2015-07-13 DIAGNOSIS — E875 Hyperkalemia: Secondary | ICD-10-CM

## 2015-07-13 DIAGNOSIS — D649 Anemia, unspecified: Secondary | ICD-10-CM

## 2015-07-13 DIAGNOSIS — E43 Unspecified severe protein-calorie malnutrition: Secondary | ICD-10-CM

## 2015-07-13 DIAGNOSIS — N179 Acute kidney failure, unspecified: Principal | ICD-10-CM

## 2015-07-13 LAB — ABO/RH: ABO/RH(D): O POS

## 2015-07-13 LAB — CBC
HEMATOCRIT: 20.8 % — AB (ref 39.0–52.0)
HEMOGLOBIN: 7 g/dL — AB (ref 13.0–17.0)
MCH: 27.8 pg (ref 26.0–34.0)
MCHC: 33.7 g/dL (ref 30.0–36.0)
MCV: 82.5 fL (ref 78.0–100.0)
Platelets: 158 10*3/uL (ref 150–400)
RBC: 2.52 MIL/uL — AB (ref 4.22–5.81)
RDW: 14.6 % (ref 11.5–15.5)
WBC: 3.4 10*3/uL — ABNORMAL LOW (ref 4.0–10.5)

## 2015-07-13 LAB — RENAL FUNCTION PANEL
ANION GAP: 9 (ref 5–15)
Albumin: 3.1 g/dL — ABNORMAL LOW (ref 3.5–5.0)
BUN: 66 mg/dL — ABNORMAL HIGH (ref 6–20)
CHLORIDE: 109 mmol/L (ref 101–111)
CO2: 22 mmol/L (ref 22–32)
Calcium: 8.5 mg/dL — ABNORMAL LOW (ref 8.9–10.3)
Creatinine, Ser: 2.88 mg/dL — ABNORMAL HIGH (ref 0.61–1.24)
GFR calc Af Amer: 24 mL/min — ABNORMAL LOW (ref >60)
GFR calc non Af Amer: 20 mL/min — ABNORMAL LOW (ref >60)
GLUCOSE: 98 mg/dL (ref 65–99)
POTASSIUM: 4.2 mmol/L (ref 3.5–5.1)
Phosphorus: 3.1 mg/dL (ref 2.5–4.6)
Sodium: 140 mmol/L (ref 135–145)

## 2015-07-13 LAB — MAGNESIUM: Magnesium: 1.5 mg/dL — ABNORMAL LOW (ref 1.7–2.4)

## 2015-07-13 LAB — PREPARE RBC (CROSSMATCH)

## 2015-07-13 LAB — HEMOGLOBIN AND HEMATOCRIT, BLOOD
HEMATOCRIT: 26.9 % — AB (ref 39.0–52.0)
Hemoglobin: 8.9 g/dL — ABNORMAL LOW (ref 13.0–17.0)

## 2015-07-13 LAB — CEA: CEA: 2.7 ng/mL (ref 0.0–4.7)

## 2015-07-13 MED ORDER — DIPHENHYDRAMINE HCL 50 MG/ML IJ SOLN
25.0000 mg | Freq: Once | INTRAMUSCULAR | Status: AC
  Administered 2015-07-13: 25 mg via INTRAVENOUS
  Filled 2015-07-13: qty 1

## 2015-07-13 MED ORDER — ACETAMINOPHEN 325 MG PO TABS
650.0000 mg | ORAL_TABLET | Freq: Once | ORAL | Status: AC
  Administered 2015-07-13: 650 mg via ORAL
  Filled 2015-07-13: qty 2

## 2015-07-13 MED ORDER — SODIUM CHLORIDE 0.9 % IV SOLN: Freq: Once | INTRAVENOUS | Status: DC

## 2015-07-13 MED ORDER — FUROSEMIDE 10 MG/ML IJ SOLN: 20.0000 mg | Freq: Once | INTRAMUSCULAR | Status: DC

## 2015-07-13 MED ORDER — TAMSULOSIN HCL 0.4 MG PO CAPS: 0.4000 mg | ORAL_CAPSULE | Freq: Every day | ORAL | Status: DC

## 2015-07-13 MED ORDER — NEPRO/CARBSTEADY PO LIQD: 237.0000 mL | Freq: Three times a day (TID) | ORAL | Status: DC

## 2015-07-13 NOTE — Progress Notes (Signed)
  [facility] Progress Note   Assessment/ Plan:   1. Acute Kidney Injury (most likely superimposed on chronic kidney disease-stage unclear given paucity of baseline data): Acute component is most likely from bladder outlet obstruction with hydroureter/hydronephrosis that is now alleviated by placement of Foley catheter. Continues to maintain good urine output (not polyuric at this time) and denies any symptoms suggestive of orthostatic dizziness. Anticipate continued renal recovery (the degree to which will be determined by the duration of his obstruction). From renal standpoint, okay to discharge at this time with outpatient follow-up as indicated.  2. Prostatomegaly with elevated PSA: this was the cause of this bladder outlet obstruction and alleviated with placement of Foley catheter-chronic management will be addressed by urology with outpatient follow-up.  3. Hyperkalemia/metabolic acidosis: corrected with alleviation of obstruction and improving renal function. 4. Anemia: Borderline microcytic, will give a single dose of intravenous iron and ESA thereafter. 5. Hypotension/Syncope/dizziness: Appears to be hemodynamically mediated by brisk urine output with fixed/low oral intake. Appears to have achieved "euvolemic" status overnight.  Subjective:   Reports to be feeling well and denies any chest pain or shortness of breath. Ambulated hallways without any dizziness    Objective:   BP 111/65 mmHg  Pulse 69  Temp(Src) 98.7 F (37.1 C) (Oral)  Resp 18  Ht 5\' 7"  (1.702 m)  Wt 55.384 kg (122 lb 1.6 oz)  BMI 19.12 kg/m2  SpO2 100%  Intake/Output Summary (Last 24 hours) at 07/13/15 1020 Last data filed at 07/13/15 0500  Gross per 24 hour  Intake      0 ml  Output   3100 ml  Net  -3100 ml   Weight change:   Physical Exam: Gen: Comfortably resting in bed watching television CVS: Pulse regular in rate and rhythm, S1 and S2 normal Resp: Clear to auscultation, no  rales/rhonchi Abd: Soft, flat, nontender Ext: No lower extremity edema  Imaging: No results found.  Labs: BMET  Recent Labs Lab 07/08/15 0440 07/08/15 1247 07/09/15 0703 07/09/15 1505 07/10/15 0600 07/11/15 0714 07/12/15 0515 07/13/15 0552  NA 144 146* 144 143 144 142 143 140  K 5.4* 4.8 3.8 4.0 4.0 3.9 3.9 4.2  CL 114* 114* 117* 114* 114* 113* 112* 109  CO2 13* 18* 15* 18* 19* 20* 19* 22  GLUCOSE 66 124* 96 122* 98 101* 96 98  BUN 130* 119* 99* 91* 80* 67* 67* 66*  CREATININE 7.67* 6.87* 5.12* 4.72* 3.95* 3.36* 3.05* 2.88*  CALCIUM 9.6 8.8* 8.0* 7.9* 8.0* 7.9* 8.1* 8.5*  PHOS 6.0*  --  4.6  --  3.0 2.8 2.9 3.1   CBC  Recent Labs Lab 07/07/15 1946 07/08/15 0115 07/13/15 0816  WBC 4.3 3.7* 3.4*  NEUTROABS 2.8  --   --   HGB 8.1* 7.2* 7.0*  HCT 23.4* 21.9* 20.8*  MCV 80.7 80.8 82.5  PLT 211 195 158    Medications:    . feeding supplement (NEPRO CARB STEADY)  237 mL Oral TID BM  . heparin  5,000 Units Subcutaneous 3 times per day  . sodium chloride flush  3 mL Intravenous Q12H  . tamsulosin  0.4 mg Oral Daily   Elmarie Shiley, MD 07/13/2015, 10:20 AM

## 2015-07-13 NOTE — Progress Notes (Signed)
Pharmacist Provided - Patient Medication Education Prior to Discharge   Asir Lanoue is an 75 y.o. male who presented to [facility] on 07/07/2015 with a chief complaint of  Chief Complaint  Patient presents with  . Abnormal Lab     [x]  Patient will be discharged with new medications []  Patient being discharged without any new medications  The following medications were discussed with the patient:   Pain Control medications: []  Yes    [x]  No  Diabetes Medications: []  Yes    [x]  No  Heart Failure Medications: []  Yes    [x]  No  Anticoagulation Medications:  []  Yes    [x]  No  Antibiotics at discharge: []  Yes    [x]  No  Allergy Assessment Completed and Updated: []  Yes    [x]  No Identified Patient Allergies: No Known Allergies   Medication Adherence Assessment: []  Excellent (no doses missed/week)      [x]  Good (1 dose missed/week)      []  Partial (2-3 doses missed/week)      []  Poor (>3 doses missed/week)  Barriers to Obtaining Medications: []  Yes [x]  No  Assessment: 75 y/o M with AKI. Does not take any medications PTA. Discussed discharge plan to start tamsulosin for enlarged prostate. Pt verbalized understanding of treatment plan.   Time spent preparing for discharge counseling: 10 min Time spent counseling patient: 15 min  Myrene Galas, PharmD 07/13/2015, 4:39 PM

## 2015-07-13 NOTE — Care Management Note (Signed)
Case Management Note  Patient Details  Name: Alondra Treon MRN: [id number] Date of Birth: January 02, 1941  Subjective/Objective:                 Patient admitted from home with  AKI, and bladder outlet obstruction 2/2 enlarged prostate. Had indwelling FC to maintain urethral patency. No CM needs identified at discharge.   Action/Plan:  Will DC to home, self care.  Expected Discharge Date:                  Expected Discharge Plan:  Home/Self Care  In-House Referral:     Discharge planning Services  CM Consult  Post Acute Care Choice:  NA Choice offered to:     DME Arranged:    DME Agency:     HH Arranged:    [place] Agency:     Status of Service:  Completed, signed off  Medicare Important Message Given:  Yes Date Medicare IM Given:    Medicare IM give by:    Date Additional Medicare IM Given:    Additional Medicare Important Message give by:     If discussed at [place] of Stay Meetings, dates discussed:    Additional Comments:  Carles Collet, RN 07/13/2015, 2:24 PM

## 2015-07-13 NOTE — Progress Notes (Signed)
Hbg resulted at 8.9 and called result to Dr. Sloan Leiter and stated okay to be discharged. All discharge information reviewed with pt and family. Pt given discharge paperwork and prescriptions. IV taken out. Pt dc home via private vehicle with wife.

## 2015-07-13 NOTE — Discharge Summary (Signed)
PATIENT DETAILS Name: Joel [name] Age: 75 y.o. Sex: male Date of Birth: 1941-01-03 MRN: [id number]. Admitting Physician: Ivor Costa, MD PCP:No primary care provider on file.  Admit Date: 07/07/2015 Discharge date: 07/13/2015  Recommendations for Outpatient Follow-up:  1. Please ensure follow-up with urology and nephrology  2. Has anemia of chronic disease-transfused PRBC prior to discharge-please follow CBC closely in the outpatient setting.  3. Please repeat CBC/BMET at next visit  PRIMARY DISCHARGE DIAGNOSIS:  Principal Problem:   AKI (acute kidney injury) ([place]) Active Problems:   Normocytic anemia   Hyperkalemia   Elevated blood pressure   Renal failure   Diarrhea   Protein-calorie malnutrition, severe (HCC)   Bilateral leg edema      PAST MEDICAL HISTORY: History reviewed. No pertinent past medical history.  DISCHARGE MEDICATIONS: Current Discharge Medication List    START taking these medications   Details  Nutritional Supplements (FEEDING SUPPLEMENT, NEPRO CARB STEADY,) LIQD Take 237 mLs by mouth 3 (three) times daily between meals. Qty: 90 Can, Refills: 0    tamsulosin (FLOMAX) 0.4 MG CAPS capsule Take 1 capsule (0.4 mg total) by mouth daily. Qty: 30 capsule, Refills: 0        ALLERGIES:  No Known Allergies  BRIEF HPI:  See H&P, Labs, Consult and Test reports for all details in brief, patient is a 75 y.o. male without significant PMH, not seen by doctor for 20 years, who presented with generalized weakness, body weight loss, diarrhea and bilateral leg edema. In the emergency room patient was found to have a creatinine of 8.17. And was admitted for further evaluation and treatment.  CONSULTATIONS:   nephrology  PERTINENT RADIOLOGIC STUDIES: Dg Chest 2 View  07/07/2015  CLINICAL DATA:  Acute onset of abnormal blood tests. Initial encounter. EXAM: CHEST  2 VIEW COMPARISON:  None. FINDINGS: The lungs are well-aerated and clear. There is no evidence of  focal opacification, pleural effusion or pneumothorax. The heart is normal in size; the mediastinal contour is within normal limits. No acute osseous abnormalities are seen. IMPRESSION: No acute cardiopulmonary process seen. Electronically Signed   By: Garald Balding M.D.   On: 07/07/2015 23:01   US Renal  07/08/2015  CLINICAL DATA:  Acute kidney injury EXAM: RENAL / URINARY TRACT ULTRASOUND COMPLETE COMPARISON:  None. FINDINGS: Right Kidney: Length: 11 cm. Borderline echogenic. High-grade hydronephrosis and upper hydroureter. No evidence of mass lesion. Left Kidney: Length: 12 cm. Borderline echogenic. High-grade hydronephrosis and hydroureter. No evidence of mass lesion. Bladder: Overdistended bladder with 1100 cc volume. The patient could only urinate 150 cc. Enlarged prostate with nodular component projecting into the bladder. Floating debris in the bladder. These results were called by telephone at the time of interpretation on 07/08/2015 at 1:47 am to Dr. Ivor Costa , who verbally acknowledged these results. IMPRESSION: 1. Prostatomegaly with bladder outlet obstruction causing bilateral hydronephrosis. 2. Bladder debris, correlate with urinalysis. Electronically Signed   By: Monte Fantasia M.D.   On: 07/08/2015 01:47     PERTINENT LAB RESULTS: CBC:  Recent Labs  07/13/15 0816  WBC 3.4*  HGB 7.0*  HCT 20.8*  PLT 158   CMET CMP     Component Value Date/Time   NA 140 07/13/2015 0552   K 4.2 07/13/2015 0552   CL 109 07/13/2015 0552   CO2 22 07/13/2015 0552   GLUCOSE 98 07/13/2015 0552   BUN 66* 07/13/2015 0552   CREATININE 2.88* 07/13/2015 0552   CREATININE 7.94* 07/05/2015 1745  CALCIUM 8.5* 07/13/2015 0552   PROT 7.8 07/07/2015 1946   ALBUMIN 3.1* 07/13/2015 0552   AST 30 07/07/2015 1946   ALT 24 07/07/2015 1946   ALKPHOS 47 07/07/2015 1946   BILITOT 0.4 07/07/2015 1946   GFRNONAA 20* 07/13/2015 0552   GFRNONAA 6* 07/05/2015 1745   GFRAA 24* 07/13/2015 0552   GFRAA 7*  07/05/2015 1745    GFR Estimated Creatinine Clearance: 18.2 mL/min (by C-G formula based on Cr of 2.88). No results for input(s): LIPASE, AMYLASE in the last 72 hours. No results for input(s): CKTOTAL, CKMB, CKMBINDEX, TROPONINI in the last 72 hours. Invalid input(s): POCBNP No results for input(s): DDIMER in the last 72 hours. No results for input(s): HGBA1C in the last 72 hours. No results for input(s): CHOL, HDL, LDLCALC, TRIG, CHOLHDL, LDLDIRECT in the last 72 hours. No results for input(s): TSH, T4TOTAL, T3FREE, THYROIDAB in the last 72 hours.  Invalid input(s): FREET3 No results for input(s): VITAMINB12, FOLATE, FERRITIN, TIBC, IRON, RETICCTPCT in the last 72 hours. Coags: No results for input(s): INR in the last 72 hours.  Invalid input(s): PT Microbiology: Recent Results (from the past 240 hour(s))  C difficile quick scan w PCR reflex     Status: None   Collection Time: 07/08/15  4:40 PM  Result Value Ref Range Status   C Diff antigen NEGATIVE NEGATIVE Final   C Diff toxin NEGATIVE NEGATIVE Final   C Diff interpretation Negative for toxigenic C. difficile  Final  Gastrointestinal Panel by PCR , Stool     Status: None   Collection Time: 07/08/15  4:40 PM  Result Value Ref Range Status   Campylobacter species NOT DETECTED NOT DETECTED Final   Plesimonas shigelloides NOT DETECTED NOT DETECTED Final   Salmonella species NOT DETECTED NOT DETECTED Final   Yersinia enterocolitica NOT DETECTED NOT DETECTED Final   Vibrio species NOT DETECTED NOT DETECTED Final   Vibrio cholerae NOT DETECTED NOT DETECTED Final   Enteroaggregative E coli (EAEC) NOT DETECTED NOT DETECTED Final   Enteropathogenic E coli (EPEC) NOT DETECTED NOT DETECTED Final   Enterotoxigenic E coli (ETEC) NOT DETECTED NOT DETECTED Final   Shiga like toxin producing E coli (STEC) NOT DETECTED NOT DETECTED Final   E. coli O157 NOT DETECTED NOT DETECTED Final   Shigella/Enteroinvasive E coli (EIEC) NOT DETECTED NOT  DETECTED Final   Cryptosporidium NOT DETECTED NOT DETECTED Final   Cyclospora cayetanensis NOT DETECTED NOT DETECTED Final   Entamoeba histolytica NOT DETECTED NOT DETECTED Final   Giardia lamblia NOT DETECTED NOT DETECTED Final   Adenovirus F40/41 NOT DETECTED NOT DETECTED Final   Astrovirus NOT DETECTED NOT DETECTED Final   Norovirus GI/GII NOT DETECTED NOT DETECTED Final   Rotavirus A NOT DETECTED NOT DETECTED Final   Sapovirus (I, II, IV, and V) NOT DETECTED NOT DETECTED Final     BRIEF HOSPITAL COURSE:   Principal Problem: Acute on chronic kidney disease-stage unclear: ARF likely secondary to bladder outlet obstruction with hydronephrosis-all likely secondary to significantly enlarged prostate. Foley catheter was placed on admission, patient was provided with IV fluids. By day of discharge, creatinine slowly decreased to 2.88 (8.17 on admission). Seen by nephrology today, okay to discharge. Has outpatient appointment with both nephrology and urology scheduled, patient has been encouraged to keep this appointment.  Active Problems: Enlarged prostate with significantly elevated PSA: Likely causing bladder outlet obstruction. Foley catheter in place, this will be continued on discharge. He will follow-up with urology for further workup,  and for outpatient was voiding trial.  Hyperkalemia with metabolic acidosis: Likely secondary to ARF. Resolved following improvement in renal function.  Anemia: Likely secondary to chronic disease, no overt blood loss evident. We will transfuse patient 1 unit of PRBC today prior to discharge. Patient will need close outpatient monitoring of CBC. Spoke to nephrology-Dr. Particia Nearing not recommend Aranesp at this time.  Diarrhea: Occurred during this hospital stay-self-limiting-no diarrhea noted on the day of discharge.. C. difficile PCR negative.  Severe protein calorie malnutrition: Continue supplements on discharge.  TODAY-DAY OF DISCHARGE:  Subjective:    Devine Scurto today has no headache,no chest abdominal pain,no new weakness tingling or numbness, feels much better wants to go home today.   Objective:   Blood pressure 111/65, pulse 69, temperature 98.7 F (37.1 C), temperature source Oral, resp. rate 18, height 5\' 7"  (1.702 m), weight 55.384 kg (122 lb 1.6 oz), SpO2 100 %.  Intake/Output Summary (Last 24 hours) at 07/13/15 1339 Last data filed at 07/13/15 1033  Gross per 24 hour  Intake    118 ml  Output   3150 ml  Net  -3032 ml   Filed Weights   07/10/15 0517 07/11/15 0534 07/12/15 0450  Weight: 60.782 kg (134 lb) 55.974 kg (123 lb 6.4 oz) 55.384 kg (122 lb 1.6 oz)    Exam Awake Alert, Oriented *3, No new F.N deficits, Normal affect [place].AT,PERRAL Supple Neck,No JVD, No cervical lymphadenopathy appriciated.  Symmetrical Chest wall movement, Good air movement bilaterally, CTAB RRR,No Gallops,Rubs or new Murmurs, No Parasternal Heave +ve B.Sounds, Abd Soft, Non tender, No organomegaly appriciated, No rebound -guarding or rigidity. No Cyanosis, Clubbing or edema, No new Rash or bruise  DISCHARGE CONDITION: Stable  DISPOSITION: Home  DISCHARGE INSTRUCTIONS:    Activity:  As tolerated   Get Medicines reviewed and adjusted: Please take all your medications with you for your next visit with your Primary MD  Please request your Primary MD to go over all hospital tests and procedure/radiological results at the follow up, please ask your Primary MD to get all Hospital records sent to his/her office.  If you experience worsening of your admission symptoms, develop shortness of breath, life threatening emergency, suicidal or homicidal thoughts you must seek medical attention immediately by calling 911 or calling your MD immediately  if symptoms less severe.  You must read complete instructions/literature along with all the possible adverse reactions/side effects for all the Medicines you take and that have been prescribed to  you. Take any new Medicines after you have completely understood and accpet all the possible adverse reactions/side effects.   Do not drive when taking Pain medications.   Do not take more than prescribed Pain, Sleep and Anxiety Medications  Special Instructions: If you have smoked or chewed Tobacco  in the last 2 yrs please stop smoking, stop any regular Alcohol  and or any Recreational drug use.  Wear Seat belts while driving.  Please note  You were cared for by a hospitalist during your hospital stay. Once you are discharged, your primary care physician will handle any further medical issues. Please note that NO REFILLS for any discharge medications will be authorized once you are discharged, as it is imperative that you return to your primary care physician (or establish a relationship with a primary care physician if you do not have one) for your aftercare needs so that they can reassess your need for medications and monitor your lab values.   Diet recommendation: Gen diet  Discharge Instructions    Call MD for:  redness, tenderness, or signs of infection (pain, swelling, redness, odor or green/yellow discharge around incision site)    Complete by:  As directed      Call MD for:  severe uncontrolled pain    Complete by:  As directed      Diet general    Complete by:  As directed      Increase activity slowly    Complete by:  As directed            Follow-up Information    Follow up with [facility] On 07/20/2015.   Why:  At 1:15 Be there at 12:45. Appt with Dr. Irine Seal   Contact information:   [place] [phone]      Follow up with [name], MD On 08/02/2015.   Specialty:  Nephrology   Why:  Appointment at [facility] will have labs 1 weeks prior to that.    Contact information:   [place] [id number] [phone]      Total Time spent on discharge equals 45  minutes.  SignedOren Binet 07/13/2015 1:39 PM

## 2015-07-13 NOTE — Progress Notes (Signed)
CM received consult for PCP. Spoke with wife who shared that they were aware that he did not have PCP and that they both did need to have a PCP. CM educated them both that they can go on the [employer] website and type "Find PCP" in the search field. CM offered to come to the room and assist but reiterated that this is a process that is best done at a leisurely pace and needs to be done shortly after discharge from hospital. Wife assured me she would/could take care of this. No further CM needs at this time.

## 2015-07-14 LAB — TYPE AND SCREEN
ABO/RH(D): O POS
Antibody Screen: NEGATIVE
Unit division: 0

## 2015-07-26 ENCOUNTER — Other Ambulatory Visit: Payer: Self-pay | Admitting: Urology

## 2015-07-28 ENCOUNTER — Inpatient Hospital Stay (HOSPITAL_COMMUNITY)
Admission: EM | Admit: 2015-07-28 | Discharge: 2015-08-01 | DRG: 984 | Disposition: A | Discharge status: Home / Self Care | Payer: Medicare HMO | Attending: Internal Medicine | Admitting: Internal Medicine

## 2015-07-28 ENCOUNTER — Other Ambulatory Visit: Payer: Self-pay

## 2015-07-28 ENCOUNTER — Encounter (HOSPITAL_COMMUNITY): Payer: Self-pay

## 2015-07-28 ENCOUNTER — Encounter (HOSPITAL_COMMUNITY)
Admission: RE | Admit: 2015-07-28 | Discharge: 2015-07-28 | Disposition: A | Discharge status: Home / Self Care | Payer: Medicare HMO | Source: Ambulatory Visit | Attending: Urology | Admitting: Urology

## 2015-07-28 ENCOUNTER — Encounter: Payer: Self-pay | Admitting: Urology

## 2015-07-28 ENCOUNTER — Encounter (HOSPITAL_COMMUNITY): Payer: Self-pay | Admitting: *Deleted

## 2015-07-28 DIAGNOSIS — Z87891 Personal history of nicotine dependence: Secondary | ICD-10-CM | POA: Diagnosis not present

## 2015-07-28 DIAGNOSIS — N183 Chronic kidney disease, stage 3 unspecified: Secondary | ICD-10-CM | POA: Diagnosis present

## 2015-07-28 DIAGNOSIS — D631 Anemia in chronic kidney disease: Secondary | ICD-10-CM | POA: Diagnosis present

## 2015-07-28 DIAGNOSIS — E43 Unspecified severe protein-calorie malnutrition: Secondary | ICD-10-CM | POA: Diagnosis present

## 2015-07-28 DIAGNOSIS — N401 Enlarged prostate with lower urinary tract symptoms: Secondary | ICD-10-CM | POA: Diagnosis present

## 2015-07-28 DIAGNOSIS — E875 Hyperkalemia: Principal | ICD-10-CM | POA: Diagnosis present

## 2015-07-28 DIAGNOSIS — D649 Anemia, unspecified: Secondary | ICD-10-CM | POA: Diagnosis not present

## 2015-07-28 DIAGNOSIS — R7989 Other specified abnormal findings of blood chemistry: Secondary | ICD-10-CM

## 2015-07-28 DIAGNOSIS — R748 Abnormal levels of other serum enzymes: Secondary | ICD-10-CM | POA: Diagnosis not present

## 2015-07-28 DIAGNOSIS — N184 Chronic kidney disease, stage 4 (severe): Secondary | ICD-10-CM | POA: Diagnosis present

## 2015-07-28 DIAGNOSIS — R972 Elevated prostate specific antigen [PSA]: Secondary | ICD-10-CM | POA: Diagnosis not present

## 2015-07-28 DIAGNOSIS — N179 Acute kidney failure, unspecified: Secondary | ICD-10-CM | POA: Diagnosis present

## 2015-07-28 DIAGNOSIS — Z6820 Body mass index (BMI) 20.0-20.9, adult: Secondary | ICD-10-CM | POA: Diagnosis not present

## 2015-07-28 DIAGNOSIS — R339 Retention of urine, unspecified: Secondary | ICD-10-CM | POA: Diagnosis present

## 2015-07-28 DIAGNOSIS — D6489 Other specified anemias: Secondary | ICD-10-CM | POA: Diagnosis not present

## 2015-07-28 DIAGNOSIS — N320 Bladder-neck obstruction: Secondary | ICD-10-CM | POA: Diagnosis present

## 2015-07-28 DIAGNOSIS — C61 Malignant neoplasm of prostate: Secondary | ICD-10-CM | POA: Diagnosis present

## 2015-07-28 HISTORY — DX: Pneumonia, unspecified organism: J18.9

## 2015-07-28 HISTORY — DX: Anemia, unspecified: D64.9

## 2015-07-28 HISTORY — DX: Aneurysm of unspecified site: I72.9

## 2015-07-28 HISTORY — DX: Unspecified hydronephrosis: N13.30

## 2015-07-28 HISTORY — DX: Retention of urine, unspecified: R33.9

## 2015-07-28 LAB — BASIC METABOLIC PANEL
Anion gap: 8 (ref 5–15)
Anion gap: 8 (ref 5–15)
BUN: 48 mg/dL — AB (ref 6–20)
BUN: 50 mg/dL — ABNORMAL HIGH (ref 6–20)
CALCIUM: 9 mg/dL (ref 8.9–10.3)
CHLORIDE: 111 mmol/L (ref 101–111)
CO2: 20 mmol/L — ABNORMAL LOW (ref 22–32)
CO2: 20 mmol/L — ABNORMAL LOW (ref 22–32)
CREATININE: 1.92 mg/dL — AB (ref 0.61–1.24)
CREATININE: 1.81 mg/dL — AB (ref 0.61–1.24)
Calcium: 8.9 mg/dL (ref 8.9–10.3)
Chloride: 113 mmol/L — ABNORMAL HIGH (ref 101–111)
GFR calc Af Amer: 39 mL/min — ABNORMAL LOW (ref >60)
GFR calc Af Amer: 41 mL/min — ABNORMAL LOW (ref >60)
GFR calc non Af Amer: 33 mL/min — ABNORMAL LOW (ref >60)
GFR, EST NON AFRICAN AMERICAN: 36 mL/min — AB (ref >60)
GLUCOSE: 95 mg/dL (ref 65–99)
Glucose, Bld: 90 mg/dL (ref 65–99)
POTASSIUM: 6.4 mmol/L — AB (ref 3.5–5.1)
Potassium: 6.1 mmol/L (ref 3.5–5.1)
Sodium: 139 mmol/L (ref 135–145)
Sodium: 141 mmol/L (ref 135–145)

## 2015-07-28 LAB — PROTIME-INR
INR: 1.31 (ref 0.00–1.49)
Prothrombin Time: 15.9 seconds — ABNORMAL HIGH (ref 11.6–15.2)

## 2015-07-28 LAB — CBC
HEMATOCRIT: 24.3 % — AB (ref 39.0–52.0)
Hemoglobin: 7.9 g/dL — ABNORMAL LOW (ref 13.0–17.0)
MCH: 28.1 pg (ref 26.0–34.0)
MCHC: 32.5 g/dL (ref 30.0–36.0)
MCV: 86.5 fL (ref 78.0–100.0)
PLATELETS: 318 10*3/uL (ref 150–400)
RBC: 2.81 MIL/uL — ABNORMAL LOW (ref 4.22–5.81)
RDW: 15.2 % (ref 11.5–15.5)
WBC: 6.2 10*3/uL (ref 4.0–10.5)

## 2015-07-28 LAB — APTT: APTT: 31 s (ref 24–37)

## 2015-07-28 MED ORDER — FUROSEMIDE 10 MG/ML IJ SOLN
40.0000 mg | Freq: Once | INTRAMUSCULAR | Status: AC
  Administered 2015-07-28: 40 mg via INTRAVENOUS
  Filled 2015-07-28: qty 4

## 2015-07-28 MED ORDER — SODIUM CHLORIDE 0.9% FLUSH
3.0000 mL | Freq: Two times a day (BID) | INTRAVENOUS | Status: DC
  Administered 2015-07-29 – 2015-07-31 (×4): 3 mL via INTRAVENOUS

## 2015-07-28 MED ORDER — NEPRO/CARBSTEADY PO LIQD
237.0000 mL | Freq: Three times a day (TID) | ORAL | Status: DC
  Administered 2015-07-28 – 2015-07-31 (×9): 237 mL via ORAL
  Filled 2015-07-28 (×14): qty 237

## 2015-07-28 MED ORDER — TAMSULOSIN HCL 0.4 MG PO CAPS
0.4000 mg | ORAL_CAPSULE | Freq: Every day | ORAL | Status: DC
Start: 2015-07-28 — End: 2015-08-01
  Administered 2015-07-28 – 2015-08-01 (×4): 0.4 mg via ORAL
  Filled 2015-07-28 (×4): qty 1

## 2015-07-28 MED ORDER — SODIUM CHLORIDE 0.9 % IV BOLUS (SEPSIS)
500.0000 mL | Freq: Once | INTRAVENOUS | Status: AC
  Administered 2015-07-28: 500 mL via INTRAVENOUS

## 2015-07-28 MED ORDER — ACETAMINOPHEN 325 MG PO TABS: 650.0000 mg | ORAL_TABLET | Freq: Four times a day (QID) | ORAL | Status: DC | PRN

## 2015-07-28 MED ORDER — ONDANSETRON HCL 4 MG/2ML IJ SOLN: 4.0000 mg | Freq: Four times a day (QID) | INTRAMUSCULAR | Status: DC | PRN

## 2015-07-28 MED ORDER — SODIUM POLYSTYRENE SULFONATE 15 GM/60ML PO SUSP
30.0000 g | Freq: Once | ORAL | Status: AC
  Administered 2015-07-29: 30 g via ORAL
  Filled 2015-07-28: qty 120

## 2015-07-28 MED ORDER — ACETAMINOPHEN 650 MG RE SUPP: 650.0000 mg | Freq: Four times a day (QID) | RECTAL | Status: DC | PRN

## 2015-07-28 MED ORDER — SODIUM CHLORIDE 0.9 % IV BOLUS (SEPSIS)
1000.0000 mL | Freq: Once | INTRAVENOUS | Status: AC
  Administered 2015-07-28: 1000 mL via INTRAVENOUS

## 2015-07-28 MED ORDER — ONDANSETRON HCL 4 MG PO TABS: 4.0000 mg | ORAL_TABLET | Freq: Four times a day (QID) | ORAL | Status: DC | PRN

## 2015-07-28 MED ORDER — SODIUM CHLORIDE 0.9 % IV SOLN
INTRAVENOUS | Status: DC
  Administered 2015-07-29: 03:00:00 via INTRAVENOUS

## 2015-07-28 MED ORDER — LEVOFLOXACIN 500 MG PO TABS
500.0000 mg | ORAL_TABLET | Freq: Every day | ORAL | Status: DC
  Administered 2015-07-28 – 2015-07-31 (×3): 500 mg via ORAL
  Filled 2015-07-28 (×3): qty 1

## 2015-07-28 NOTE — Progress Notes (Signed)
07-21-15 - BMP lab results in chart & EPIC 07-11-15 - EKG - EPIC 07-10-15 - Echo - EPIC 07-07-15 - CXR - EPIC 07-05-15 - LOV - Dr. Joseph Art (fam.med.) - EPIC 07-05-15 - HgbA1C (6.3) - EPIC

## 2015-07-28 NOTE — Patient Instructions (Addendum)
Joel [name]  07/28/2015   Your procedure is scheduled on: July 29, 2015  Report to [name]  Entrance take [name]  elevators to 3rd floor to  [name] at  5:30 AM.  Call this number if you have problems the morning of surgery [phone]   Remember: ONLY 1 PERSON MAY GO WITH YOU TO SHORT STAY TO GET  READY MORNING OF Joel [name].  Do not eat food or drink liquids :After Midnight.              Bowel prep according to physicians instructions     Take these medicines the morning of surgery with A SIP OF WATER:   Flomax DO NOT TAKE ANY DIABETIC MEDICATIONS DAY OF YOUR SURGERY                               You may not have any metal on your body including hair pins and              piercings  Do not wear jewelry,  lotions, powders or perfumes, deodorant                         Men may shave face and neck.   Do not bring valuables to the hospital. [place].  Contacts, dentures or bridgework may not be worn into surgery.  Leave suitcase in the car. After surgery it may be brought to your room.       Special Instructions: coughing and deep breathing exercises, leg exercises              Please read over the following fact sheets you were given: _____________________________________________________________________             [name] - Preparing for Surgery Before surgery, you can play an important role.  Because skin is not sterile, your skin needs to be as free of germs as possible.  You can reduce the number of germs on your skin by washing with CHG (chlorahexidine gluconate) soap before surgery.  CHG is an antiseptic cleaner which kills germs and bonds with the skin to continue killing germs even after washing. Please DO NOT use if you have an allergy to CHG or antibacterial soaps.  If your skin becomes reddened/irritated stop using the CHG and inform your nurse when you arrive at Short  Stay. Do not shave (including legs and underarms) for at least 48 hours prior to the first CHG shower.  You may shave your face/neck. Please follow these instructions carefully:  1.  Shower with CHG Soap the night before surgery and the  morning of Surgery.  2.  If you choose to wash your hair, wash your hair first as usual with your  normal  shampoo.  3.  After you shampoo, rinse your hair and body thoroughly to remove the  shampoo.                           4.  Use CHG as you would any other liquid soap.  You can apply chg directly  to the skin and wash  Gently with a scrungie or clean washcloth.  5.  Apply the CHG Soap to your body ONLY FROM THE NECK DOWN.   Do not use on face/ open                           Wound or open sores. Avoid contact with eyes, ears mouth and genitals (private parts).                       Wash face,  Genitals (private parts) with your normal soap.             6.  Wash thoroughly, paying special attention to the area where your surgery  will be performed.  7.  Thoroughly rinse your body with warm water from the neck down.  8.  DO NOT shower/wash with your normal soap after using and rinsing off  the CHG Soap.                9.  Pat yourself dry with a clean towel.            10.  Wear clean pajamas.            11.  Place clean sheets on your bed the night of your first shower and do not  sleep with pets. Day of Surgery : Do not apply any lotions/deodorants the morning of surgery.  Please wear clean clothes to the hospital/surgery center.  FAILURE TO FOLLOW THESE INSTRUCTIONS MAY RESULT IN THE CANCELLATION OF YOUR SURGERY PATIENT SIGNATURE_________________________________  NURSE SIGNATURE__________________________________  ________________________________________________________________________

## 2015-07-28 NOTE — H&P (Addendum)
[facility] History and Physical  Kohlby Gillim [id number] DOB: [birth year] DOA: 07/28/2015  Referring physician: ED physician PCP: [name], MD  Specialists:   Chief Complaint: Hyperkalemia  HPI: Joel [name] is a 75 y.o. 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  Patient was recently hospitalized from 3/16-3/22 due to acute renal injury. He was found to have bilateral hydronephrosis and elevated PSA. Pt states that he is scheduled by urologist, Dr Roni Bread for biopsy tomorrow. He had lab done today and found to have hyperkalemia 6.4 and therefore was sent to ED for further evaluation and treatment. Patient denies any symptoms. No fevere, chills, chest pain, shortness of breath, abdominal pain, nausea, vomiting, diarrhea. He has Foley catheter, but no symptoms of UTI. Pt states that he was instructed by Dr. Jeffie Pollock to take 500 mg of Levaquin at day before, at the day of and the day after the biopsy.   In ED, patient was found to have potassium 6.1 without EKG change, creatinine 1.8 which was 2.88 on 07/13/15, WBC 6.2, temperature normal, no tachycardia. Pt is admitted to inpatient for further evaluation and treatment.  EKG: Independently reviewed. QTc 407, no T-wave peaking.   Where does patient live?   At home   Can patient participate in ADLs?  Some   Review of Systems:   General: no fevers, chills, no changes in body weight, no fatigue HEENT: no blurry vision, hearing changes or sore throat Pulm: no dyspnea, coughing, wheezing CV: no chest pain, no palpitations Abd: no nausea, vomiting, abdominal pain, diarrhea, constipation GU: no dysuria, burning on urination, increased urinary frequency, hematuria  Ext: no leg edema Neuro: no unilateral weakness, numbness, or tingling, no vision change or hearing  loss Skin: no rash MSK: No muscle spasm, no deformity, no limitation of range of movement in spin Heme: No easy bruising.  Travel history: No recent long distant travel.  Allergy: No Known Allergies  Past Medical History  Diagnosis Date  . Pneumonia     hx of   . Anemia   . Aneurysm ([place])     in 1987    History reviewed. No pertinent past surgical history.  Social History:  reports that he quit smoking about 30 years ago. He has quit using smokeless tobacco. His smokeless tobacco use included Chew. He reports that he does not drink alcohol or use illicit drugs.  Family History:  Family History  Problem Relation Age of Onset  . Dementia Mother   . Heart disease Father   . Lung cancer Brother   . Lupus Sister      Prior to Admission medications   Medication Sig Start Date End Date Taking? Authorizing Provider  Nutritional Supplements (FEEDING SUPPLEMENT, NEPRO CARB STEADY,) LIQD Take 237 mLs by mouth 3 (three) times daily between meals. 07/13/15  Yes Shanker Kristeen Mans, MD  tamsulosin (FLOMAX) 0.4 MG CAPS capsule Take 1 capsule (0.4 mg total) by mouth daily. 07/13/15  Yes Shanker Kristeen Mans, MD    Physical Exam: Filed Vitals:   07/28/15 2001 07/28/15 2038 07/28/15 2112 07/28/15 2140  BP: 142/84 120/74 132/80 141/85  Pulse: 72 66 73 71  Temp: 97.8 F (36.6 C)  97.7 F (36.5 C)   TempSrc: Oral  Oral   Resp: 14 15 15 13   SpO2: 100% 100% 100% 100%   General: Not in acute distress HEENT:  Eyes: PERRL, EOMI, no scleral icterus.       ENT: No discharge from the ears and nose, no pharynx injection, no tonsillar enlargement.        Neck: No JVD, no bruit, no mass felt. Heme: No neck lymph node enlargement. Cardiac: S1/S2, RRR, No murmurs, No gallops or rubs. Pulm: No rales, wheezing, rhonchi or rubs. Abd: Soft, nondistended, nontender, no rebound pain, no organomegaly, BS present. Ext: No pitting leg edema bilaterally. 2+DP/PT pulse bilaterally. Musculoskeletal: No  joint deformities, No joint redness or warmth, no limitation of ROM in spin. Skin: No rashes.  Neuro: Alert, oriented X3, cranial nerves II-XII grossly intact, moves all extremities normally.  Psych: Patient is not psychotic, no suicidal or hemocidal ideation.  Labs on Admission:  Basic Metabolic Panel:  Recent Labs Lab 07/28/15 1120 07/28/15 2013  NA 139 141  K 6.4* 6.1*  CL 111 113*  CO2 20* 20*  GLUCOSE 95 90  BUN 48* 50*  CREATININE 1.92* 1.81*  CALCIUM 8.9 9.0   Liver Function Tests: No results for input(s): AST, ALT, ALKPHOS, BILITOT, PROT, ALBUMIN in the last 168 hours. No results for input(s): LIPASE, AMYLASE in the last 168 hours. No results for input(s): AMMONIA in the last 168 hours. CBC:  Recent Labs Lab 07/28/15 1120  WBC 6.2  HGB 7.9*  HCT 24.3*  MCV 86.5  PLT 318   Cardiac Enzymes: No results for input(s): CKTOTAL, CKMB, CKMBINDEX, TROPONINI in the last 168 hours.  BNP (last 3 results)  Recent Labs  07/08/15 0106  BNP 104.0*    ProBNP (last 3 results) No results for input(s): PROBNP in the last 8760 hours.  CBG: No results for input(s): GLUCAP in the last 168 hours.  Radiological Exams on Admission: No results found.  Assessment/Plan Principal Problem:   Hyperkalemia Active Problems:   Normocytic anemia   Protein-calorie malnutrition, severe (HCC)   Elevated prostate specific antigen (PSA)   CKD (chronic kidney disease), stage IV (HCC)   Urinary retention   Hyperkalemia: K=6.1 without EKG change. Renal fx is stable. Hemodynamically stable. -will admit to tele bed -IVF; 1.5 L of NS, then 75 cc/h -lasix 40 mg x 1 by ED -Kayexalate 30 g x 1  Hx of urinary retention and elevated prostate specific antigen: scheduled for biopsy tomorrow. Has Foley cath in place -Levaquin 500 mg daily per urology -continue flomax -INR/PTT/type & screen -NPO after MN -please inform Dr. Jeffie Pollock that pt is in hospital in AM  CKD (chronic kidney  disease), stage IV ([place]): stable Cre 2.88 on 07/13/15-->1.80 today -IVF as above  Normocytic anemia: Hgb 8.9 on 07/13/15-->7.9 today. -f/u by CBC  Protein-calorie malnutrition, severe ([place]) -Ensure  DVT ppx: SCD  Code Status: Full code Family Communication: Yes, patient's wife at bed side Disposition Plan: Admit to inpatient   Date of Service 07/28/2015    Ivor Costa [facility] Pager [phone]  If 7PM-7AM, please contact night-coverage [email or web address] Password [email or web address] 07/28/2015, 9:45 PM

## 2015-07-28 NOTE — Progress Notes (Signed)
07-28-15 - CBC and BMP lab results from preop visit on 07-28-15 faxed to Dr. Jeffie Pollock via [facility]

## 2015-07-28 NOTE — ED Notes (Signed)
Pt had lab work done in preparation for surgery tomorrow but was told his potassium was elevated.  In the chart, pt's potassium is a 6.4  Pt denies any symptoms.

## 2015-07-28 NOTE — ED Provider Notes (Signed)
CSN: [id number]     Arrival date & time 07/28/15  1832 History   First MD Initiated Contact with Patient 07/28/15 1907     Chief Complaint  Patient presents with  . Abnormal Lab     (Consider location/radiation/quality/duration/timing/severity/associated sxs/prior Treatment) The history is provided by the patient and medical records. No language interpreter was used.   Joel [name] is a 75 y.o. male  with a hx of anemia, renal failure with hyperkalemia (March 2017) presents to the Emergency Department After being contacted by a preop nurse. Patient reports that he is scheduled for a prostate biopsy tomorrow morning. He presented to the hospital today for lab clearance and was called this afternoon for an elevated potassium of 6.4. Patient denies any somatic symptoms. Chart review shows that patient was admitted in mid March 2017 for renal failure and hyperkalemia. It was thought this was potentially due to a urinary obstruction located in the prostate. .  Past Medical History  Diagnosis Date  . Pneumonia     hx of   . Anemia   . Aneurysm ([place])     in 1987   History reviewed. No pertinent past surgical history. Family History  Problem Relation Age of Onset  . Dementia Mother   . Heart disease Father   . Lung cancer Brother   . Lupus Sister    Social History  Substance Use Topics  . Smoking status: Former Smoker    Quit date: 04/28/1985  . Smokeless tobacco: Former Systems developer    Types: Chew     Comment: quit 30-40 yrs. aog  . Alcohol Use: No    Review of Systems  Constitutional: Negative for fever, diaphoresis, appetite change, fatigue and unexpected weight change.  HENT: Negative for mouth sores.   Eyes: Negative for visual disturbance.  Respiratory: Negative for cough, chest tightness, shortness of breath and wheezing.   Cardiovascular: Negative for chest pain.  Gastrointestinal: Negative for nausea, vomiting, abdominal pain, diarrhea and constipation.  Endocrine: Negative  for polydipsia, polyphagia and polyuria.  Genitourinary: Negative for dysuria, urgency, frequency and hematuria.  Musculoskeletal: Negative for back pain and neck stiffness.  Skin: Negative for rash.  Allergic/Immunologic: Negative for immunocompromised state.  Neurological: Negative for syncope, light-headedness and headaches.  Hematological: Does not bruise/bleed easily.  Psychiatric/Behavioral: Negative for sleep disturbance. The patient is not nervous/anxious.       Allergies  Review of patient's allergies indicates no known allergies.  Home Medications   Prior to Admission medications   Medication Sig Start Date End Date Taking? Authorizing Provider  Nutritional Supplements (FEEDING SUPPLEMENT, NEPRO CARB STEADY,) LIQD Take 237 mLs by mouth 3 (three) times daily between meals. 07/13/15  Yes Shanker Kristeen Mans, MD  tamsulosin (FLOMAX) 0.4 MG CAPS capsule Take 1 capsule (0.4 mg total) by mouth daily. 07/13/15  Yes Shanker Kristeen Mans, MD   BP 124/80 mmHg  Pulse 84  Temp(Src) 97.9 F (36.6 C) (Oral)  Resp 18  SpO2 100% Physical Exam  Constitutional: He appears well-developed and well-nourished. No distress.  Awake, alert, nontoxic appearance  HENT:  Head: Normocephalic and atraumatic.  Mouth/Throat: Oropharynx is clear and moist. No oropharyngeal exudate.  Eyes: Conjunctivae are normal. No scleral icterus.  Neck: Normal range of motion. Neck supple.  Cardiovascular: Normal rate, regular rhythm, normal heart sounds and intact distal pulses.   No murmur heard. Pulmonary/Chest: Effort normal and breath sounds normal. No respiratory distress. He has no wheezes.  Equal chest expansion  Abdominal: Soft. Bowel sounds  are normal. He exhibits no distension and no mass. There is no tenderness. There is no rebound and no guarding.  Musculoskeletal: Normal range of motion. He exhibits no edema.  Neurological: He is alert.  Speech is clear and goal oriented Moves extremities without  ataxia  Skin: Skin is warm and dry. He is not diaphoretic.  Psychiatric: He has a normal mood and affect.  Nursing note and vitals reviewed.   ED Course  Procedures (including critical care time) Labs Review Labs Reviewed  BASIC METABOLIC PANEL - Abnormal; Notable for the following:    Potassium 6.1 (*)    Chloride 113 (*)    CO2 20 (*)    BUN 50 (*)    Creatinine, Ser 1.81 (*)    GFR calc non Af Amer 36 (*)    GFR calc Af Amer 41 (*)    All other components within normal limits      EKG Interpretation   Date/Time:  Thursday July 28 2015 19:04:20 EDT Ventricular Rate:  79 PR Interval:  220 QRS Duration: 94 QT Interval:  355 QTC Calculation: 407 R Axis:   48 Text Interpretation:  Sinus rhythm Prolonged PR interval Anteroseptal  infarct, old No significant change since last tracing Confirmed by  Somers ([id number]) on 07/28/2015 7:13:27 PM      MDM   Final diagnoses:  Hyperkalemia  Elevated serum creatinine   Cyndy Freeze presents with hyperkalemia. No EKG changes. Repeat labs show potassium of 6.1. Serum creatinine of 1.8, improved from previous admission where he was last found to be 2.8.  H&H is asymptomatic. Patient given Lasix and fluid. Discussed with Dr. Blaine Hamper who will admit to telemetry.  Patient is scheduled for prostate biopsy in the a.m.  CBC completed this morning showed anemia but no leukocytosis or other abnormality.  BP 120/74 mmHg  Pulse 66  Temp(Src) 97.8 F (36.6 C) (Oral)  Resp 15  SpO2 100%  The patient was discussed with and seen by Dr. Alfonse Spruce who agrees with the treatment plan.    Jarrett Soho Celisa Schoenberg, PA-C 07/28/15 2110  Harvel Quale, MD 07/29/15 2207

## 2015-07-28 NOTE — Progress Notes (Signed)
07-28-15 - @ 42 called Dr. Ralene Muskrat office to report Potassium Critical Value (6.4).  Talked to Dr. Ralene Muskrat nurse Ander Purpura.  She stated she will notify Dr. Jeffie Pollock on critical value.

## 2015-07-28 NOTE — Progress Notes (Signed)
CRITICAL VALUE ALERT  Critical value received: Potassium 6.4  Date of notification:  07-28-15  Time of notification:  [id number]  Critical value read back:Yes.    Nurse who received alert:  Conni Elliot, RN  MD notified (1st page):  Dr. Ralene Muskrat nurse - Ander Purpura -   Time of first page:  Called at 1345  MD notified (2nd page):none  Time of second page:none  Responding MD: Dr. Ralene Muskrat nurse - Ander Purpura - will make Dr. Jeffie Pollock aware of results  Time MD responded:  1345

## 2015-07-28 NOTE — ED Notes (Addendum)
Per conversation with charge nurse at( 10:00pm) and floor, 5E asked for another hour due to under staffing on floor.  Called at 11:09 again 5E asks for another 20 min's.pt informed of wait and is comfortable and happy and polite/ accommodating

## 2015-07-28 NOTE — Progress Notes (Signed)
Patient ID: Joel [name], male   DOB: [birth year], 75 y.o.   MRN: [id number]   Chief Complaint Can't pee   Active Problems Problems  1. Acute urinary retention (R33.8) 2. Elevated prostate specific antigen (PSA) (R97.20) 3. 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 Sloan Leiter for retention and an elevated PSA of 95.  He reports he has been losing weight for about 3 months and developed edema. He lost his sense of taste about 2 months ago and lost about 30lb. He started to have increased frequency of urination q1hr and nocturia 2-3x. He had an adequate stream and he felt he was emptying.  He had no dysuria or hematuria. He was admitted to the hospital after being seen at Urgent care because he had a Cr of 3.95 and severe edema. He had an [place] that showed hydro and 1124m in the bladder. He had a foley placed. His Cr fell to 2.88 but his PSA was 95. He had been healthy until this point.   Past Medical History Problems  1. 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 History of anemia (Z86.2) 3. History of hypertension (Z86.79) 4. History of renal failure ((B70.488  Surgical History Problems  1. History of No Surgical Problems  Current Meds 1. Tamsulosin HCl - 0.4 MG Oral Capsule;  Therapy: (Recorded:29Mar2017) to Recorded  Allergies Medication  1. No Known Drug Allergies  Family History Problems  1. No pertinent family history  Social History Problems    Denied: History of Alcohol use   Caffeine use (F15.90)   1 a day   Former smoker [phone]   Married   Number of children   1 daughter   Occupation   TPharmacologist Review of Systems Genitourinary, constitutional, skin, eye, otolaryngeal, hematologic/lymphatic, cardiovascular, pulmonary, endocrine, musculoskeletal, gastrointestinal, neurological and psychiatric system(s) were reviewed and pertinent findings if  present are noted and are otherwise negative.  Genitourinary: urinary frequency, feelings of urinary urgency and nocturia.  Constitutional: feeling tired (fatigue) and recent weight loss.  Integumentary: pruritus.  Cardiovascular: leg swelling.  Neurological: a burning sensation (in his legs. They just get hot. ).    Vitals Vital Signs [Data Includes: Last 1 Day]  Recorded: [id number]:33PM  Height: 5 ft 7 in Weight: 130 lb  BMI Calculated: 20.36 BSA Calculated: 1.68 Blood Pressure: 136 / 76 Temperature: 100 F Heart Rate: 97  Physical Exam Constitutional: well developed . No acute distress. Cachectic.  ENT:. The ears and nose are normal in appearance.  Neck: The appearance of the neck is normal and no neck mass is present.  Pulmonary: No respiratory distress and normal respiratory rhythm and effort.  Cardiovascular: Heart rate and rhythm are normal . No peripheral edema.  Abdomen: The abdomen is soft and nontender. No masses are palpated. No CVA tenderness. No hernias are palpable. No hepatosplenomegaly noted.  Rectal: Rectal exam demonstrates normal sphincter tone, no tenderness and no masses. The prostate has a palpable nodule involving the right, mid aspect (deep and about 2cm) of the prostate, is indurated (diffuse) and is not tender. The left seminal vesicle is nonpalpable. The right seminal vesicle is nonpalpable. The perineum is normal on inspection (except for a small decubitus ulcer on the right buttock. ).  Genitourinary: Examination of the penis demonstrates an indwelling catheter, but no discharge, no masses, no lesions and a normal meatus. The scrotum is without lesions.  The right epididymis is palpably normal and non-tender. The left epididymis is palpably normal and non-tender. The right testis is atrophic, but non-tender and without masses. The left testis is atrophic, but non-tender and without masses.  Lymphatics: The posterior cervical, supraclavicular, axillary,  femoral and inguinal nodes are not enlarged or tender.  Skin: Normal skin turgor, no visible rash and no visible skin lesions.  Neuro/Psych:. Mood and affect are appropriate. Normal sensation of the perineum/perianal region (S3,4,5).    Results/Data  Old records or history reviewed: I have reviewed his hospital records and labs.  The following images/tracing/specimen were independently visualized:  [place] films and report reviewed.  The following clinical lab reports were reviewed:  Hospital labs reviewed.    Procedure  Procedure: Transrectal ultrasound of the prostate.  Indication: Elevated PSA.  Local Anesthesia:  Antibiotic prophylaxis:  Procedure Note: The prostate had a palpable nodule (right mid ). The 10 MHz transrectal ultrasound probe was placed into the rectum. Prostate width measured 5.91cm, height of 3.49cm and length of 4.56cm . Prostate volume was measured to be 49 cc. The prostate was homogenous without lesions on ultrasound. Calcifications were noted. (scant. ) . The seminal vesicles appeared normal. A median lobe was present. There is minimal fat around the prostate making the capsule difficult to discern but there is nothing to suggest extraprostatic tumor. the foley balloon is visible in the collapsed bladder.  Patient Status:. The patient tolerated the procedure well.  Complications:. None.    Assessment Assessed  1. Acute urinary retention (R33.8) 2. 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 Elevated prostate specific antigen (PSA) (R97.20)  He has a nodular prostate with a very elevated PSA which could be from prostate cancer or possible a prostatic infarct with acute on chronic retention.  He has obstructive uropathy with ARI that was resolving during the hospital stay.  He has a low grade fever today.  He has cachexia and burning in his legs of uncertain etiology.   Plan Acute urinary retention  1. URINE CULTURE; Status:In Progress -  Specimen/Data Collected;   Done: [id number] Elevated prostate specific antigen (PSA)  2. Start: LevoFLOXacin 250 MG Oral Tablet (Levaquin); Take 1 tablet daily 3. BASIC METABOLIC PANEL; Status:In Progress - Specimen/Data Collected;   Done:  [id number] 4. Complex Uroflowmetry; Status:Hold For - Date of Service; Requested for:04Apr2017;  5. Cysto; Status:Hold For - Date of Service; Requested for:04Apr2017;  6. PROSTATE BIOPSY ULTRASOUND; Status:Hold For - Date of Service; Requested  for:04Apr2017;  7. PROSTATE U/S; Status:Resulted - Requires Verification;   Done: [id number] 03:14PM 8. PSA; Status:In Progress - Specimen/Data Collected;   Done: [id number] 9. TESTOSTERONE; Status:In Progress - Specimen/Data Collected;   Done: 37TKW4097 10. VENIPUNCTURE; Status:Complete;   Done: [id number] 68. Follow-up Office  Follow-up  Status: Hold For - Date of Service  Requested for:   04Apr2017  Urine culture was obtained from the catheter.  I am going to start him on Levaquin 247m daily but will adjust the med based on the culture and the BMP done today.   I have repeated a PSA and ordered a testosterone.  I am going to have him return next Tuesday for a prostate [place] biopsy and cystoscopy with a voiding trial.    Chief Complaint I can't pee   Active Problems Problems  1. Acute urinary retention (R33.8) 2. Bilateral hydronephrosis (N13.30) 3. Elevated prostate specific antigen (PSA) (R97.20) 4. Hypogonadism, testicular (E29.1) 5. 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 Villalpando returns today for cystoscopy and a flowrate as well as a prostate biopsy. His Cr was down further to 2.25 and the PSA fell to 66, but his testosterone level is 41 which raises the concern for CRCP.   Past Medical History Problems  1. 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 History of anemia (Z86.2) 3. History of hypertension (Z86.79) 4. History of renal failure  (Y63.785)  Surgical History Problems  1. History of No Surgical Problems  Current Meds 1. LevoFLOXacin 250 MG Oral Tablet; Take 1 tablet daily;  Therapy: [id number] to (Evaluate:05Apr2017)  Requested for: [phone]; Last  Rx:29Mar2017 Ordered 2. Tamsulosin HCl - 0.4 MG Oral Capsule;  Therapy: (Recorded:29Mar2017) to Recorded  Allergies Medication  1. No Known Drug Allergies  Family History Problems  1. No pertinent family history  Social History Problems  1. Denied: History of Alcohol use 2. Caffeine use (F15.90)   1 a day 3. Former smoker [phone]) 4. Married 5. Number of children   1 daughter 6. Occupation   Truckdriver  Review of Systems  Constitutional: recent weight gain.  Musculoskeletal: no bone pain.    Vitals Vital Signs [Data Includes: Last 1 Day]  Recorded: 04Apr2017 12:12PM  Weight: 133 lb  BMI Calculated: 20.83 BSA Calculated: 1.7  Results/Data  The following clinical lab reports were reviewed:  Labs reviewed. Selected Results  PSA [id number] 02:27PM Irine Seal  SPECIMEN TYPE: BLOOD   Test Name Result Flag Reference  PSA 62.97 ng/mL H <=4.00  TEST METHODOLOGY: ECLIA PSA (ELECTROCHEMILUMINESCENCE IMMUNOASSAY)   TESTOSTERONE [id number] 02:27PM Irine Seal  SPECIMEN TYPE: BLOOD   Test Name Result Flag Reference  TESTOSTERONE, TOTAL 41 ng/dL L 250-827  IN HYPOGONADAL MALES, TESTOSTERONE, TOTAL,LC/MS/MS IS THE RECOMMENDED ASSAY DUE TO THE DIMINISHED ACCURACY OF IMMUNOASSAY AT LEVELS BELOW 250 NG/DL.  THIS TEST CODE (46568) MUST BE COLLECTED IN A RED-TOP TUBE WITH NO GEL.  TWO MORNING (8 - 10 AM) SPECIMENS OBTAINED ON DIFFERENT DAYS ARE RECOMMENDED BY THE ENDOCRINE SOCIETY FOR SCREENING FOR HYPOGONADISM.   BASIC METABOLIC PANEL [id number] 74:94WH Irine Seal  SPECIMEN TYPE: BLOOD   Test Name Result Flag Reference  GLUCOSE 116 mg/dL H 65-99  BUN 57 mg/dL H 7-25  CREATININE 2.25 mg/dL H 0.50-1.50  SODIUM 138 mmol/L  135-146  POTASSIUM  5.9 mmol/L H 3.5-5.3  NO VISIBLE HEMOLYSIS.  CHLORIDE 107 mmol/L  98-110  CO2 19 mmol/L L 20-31  CALCIUM 9.0 mg/dL  8.6-10.3  Est GFR, African American 32 mL/min L >=60  Est GFR, NonAfrican American 28 mL/min L >=60  THE ESTIMATED GFR IS A CALCULATION VALID FOR ADULTS (>=73 YEARS OLD) THAT USES THE CKD-EPI ALGORITHM TO ADJUST FOR AGE AND SEX. IT IS   NOT TO BE USED FOR CHILDREN, PREGNANT WOMEN, HOSPITALIZED PATIENTS,    PATIENTS ON DIALYSIS, OR WITH RAPIDLY CHANGING KIDNEY FUNCTION. ACCORDING TO THE NKDEP, EGFR >89 IS NORMAL, 60-89 SHOWS MILD IMPAIRMENT, 30-59 SHOWS MODERATE IMPAIRMENT, 15-29 SHOWS SEVERE IMPAIRMENT AND <15 IS ESRD.   URINE CULTURE [id number] 02:27PM Irine Seal  SOURCE : CATHED SPECIMEN TYPE: URINE   Test Name Result Flag Reference  CULTURE, URINE Culture, Urine    ===== COLONY COUNT: =====  >=100,000 COLONIES/ML   FINAL REPORT: ESCHERICHIA COLI   SENSITIVITY FOR: ESCHERICHIA COLI    TETRACYCLINE                           SENSITIVE        <=  1    AMPICILLIN                             RESISTANT       >=32    AMOX/CLAVULANIC                        SENSITIVE          4    AMPICILLIN/SUL                         INDETERMINATE     16    PIPERACILLIN/TAZO                      SENSITIVE        <=4    IMIPENEM                               SENSITIVE     <=0.25    CEFAZOLIN                              NR               <=4    CEFTRIAXONE                            SENSITIVE        <=1    CEFTAZIDIME                            SENSITIVE        <=1    CEFEPIME                               SENSITIVE        <=1    GENTAMICIN                             SENSITIVE        <=1    TOBRAMYCIN                             SENSITIVE        <=1    CIPROFLOXACIN                          SENSITIVE     <=0.25    LEVOFLOXACIN                           SENSITIVE     <=0.12    NITROFURANTOIN                         SENSITIVE       <=16    TRIMETH/SULFA                           SENSITIVE       <=20   NR=NOT REPORTABLE,SEE COMMENT ORAL THERAPY:A CEFAZOLIN MIC OF <32 PREDICTS  SUSCEPTIBILITY  TO THE ORAL AGENTS CEFACLOR, CEFDINIR,CEFPODOXIME,CEFPROZIL,CEFUROXIME, CEPHALEXIN,AND LORACARBEF WHEN USED FOR THERAPY  OF UNCOMPLICATED UTIS DUE TO E.COLI,K.PNEUMOMIAE, AND P.MIRABILIS. PARENTERAL THERAPY: A CEFAZOLIN MIC OF >8 INDICATES RESISTANCE TO PARENTERAL CEFAZOLIN. AN ALTERNATE TEST METHOD MUST BE PERFORMED TO CONFIRM SUSCEPTIBILITY TO PARENTERAL CEFAZOLIN.   END OF REPORT   Procedure  Procedure: Cystoscopy   Indication: Lower Urinary Tract Symptoms.  Informed Consent: Risks, benefits, and potential adverse events were discussed and informed consent was obtained from the patient.  Prep: The patient was prepped with betadine.  Antibiotic prophylaxis: Levofloxacin.  Procedure Note:  Urethral meatus:. No abnormalities.  Anterior urethra: No abnormalities.  Prostatic urethra:. Estimated length was 4 cm. The lateral and median prostatic lobes were enlarged. An enlarged intravesical median lobe was visualized. (that is about 2cm and has an appearance that is worrisome for cancer. ).  Bladder: Visulization was clear. The ureteral orifices were in the normal anatomic position bilaterally and had clear efflux of urine. A systematic survey of the bladder demonstrated no bladder tumors or stones. Examination of the bladder demonstrated moderate trabeculation erythematous mucosa (he has catheter irritation with edema on the posterior wall. ). He was unable to void after the procedure and a 70f foley was inserted to leg bag drainage. The patient tolerated the procedure well.  Complications: None.    Assessment Assessed  1. Bilateral hydronephrosis (N13.30) 2. Acute urinary retention (R33.8) 3. 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 Elevated prostate specific antigen (PSA) (R97.20) 5. Hypogonadism, testicular (E29.1) 6. Acute renal  insufficiency (N28.9)  He has retention with an elevated PSA and profound hypogonadism with findings on cystoscopy worrisome for malignant obstruction.  He has hypogonadism and probably has CRCP although he has been very malnourished and the testosterone may rebound with his improved appetite.  He is feeling better and has gained 12 lbs since his hospital stay.   Plan Bilateral hydronephrosis  1. AU CT-STONE PROTOCOL; Status:Hold For - Appointment,Date of Service,PreCert,Print;  Requested for:04Apr2017;  Elevated prostate specific antigen (PSA)  2. Complex Uroflowmetry; Status:Canceled - Date of Service;  3. PROSTATE BIOPSY ULTRASOUND; Status:Canceled - Date of Service;  Nodular prostate without lower urinary tract symptoms  4. Follow-up Schedule Surgery Office  Follow-up  Status: Hold For - Appointment   Requested for: 04Apr2017 Unlinked  5. Cath, simple, wIinsert Temp Cath; Status:Complete;   Done:: [id number] I have decided to cancel the biopsy today as he is going to need a TURP. I will do the biopsy in conjunction with the TURP and reviewed the risks of bleeding, infection, incontinence, persistent retention, strictures, fluid overload, sexual dysfunction, thrombotic events and anesthetic complications.  I will try to get him set up this week.   I am going to get a CT scan to reassess the hydro and rule out mets.

## 2015-07-29 ENCOUNTER — Other Ambulatory Visit: Payer: Self-pay | Admitting: Urology

## 2015-07-29 ENCOUNTER — Ambulatory Visit (HOSPITAL_COMMUNITY): Admission: RE | Admit: 2015-07-29 | Payer: Medicare HMO | Source: Ambulatory Visit | Admitting: Urology

## 2015-07-29 ENCOUNTER — Encounter (HOSPITAL_COMMUNITY): Admission: RE | Payer: Self-pay | Source: Ambulatory Visit

## 2015-07-29 ENCOUNTER — Encounter (HOSPITAL_COMMUNITY): Payer: Self-pay | Admitting: Anesthesiology

## 2015-07-29 DIAGNOSIS — R339 Retention of urine, unspecified: Secondary | ICD-10-CM

## 2015-07-29 DIAGNOSIS — R748 Abnormal levels of other serum enzymes: Secondary | ICD-10-CM

## 2015-07-29 DIAGNOSIS — N184 Chronic kidney disease, stage 4 (severe): Secondary | ICD-10-CM

## 2015-07-29 DIAGNOSIS — D6489 Other specified anemias: Secondary | ICD-10-CM

## 2015-07-29 DIAGNOSIS — R7989 Other specified abnormal findings of blood chemistry: Secondary | ICD-10-CM | POA: Insufficient documentation

## 2015-07-29 DIAGNOSIS — E875 Hyperkalemia: Principal | ICD-10-CM

## 2015-07-29 DIAGNOSIS — E43 Unspecified severe protein-calorie malnutrition: Secondary | ICD-10-CM

## 2015-07-29 DIAGNOSIS — D649 Anemia, unspecified: Secondary | ICD-10-CM | POA: Diagnosis present

## 2015-07-29 LAB — COMPREHENSIVE METABOLIC PANEL
ALT: 29 U/L (ref 17–63)
AST: 22 U/L (ref 15–41)
Albumin: 3.2 g/dL — ABNORMAL LOW (ref 3.5–5.0)
Alkaline Phosphatase: 53 U/L (ref 38–126)
Anion gap: 6 (ref 5–15)
BUN: 45 mg/dL — AB (ref 6–20)
CHLORIDE: 117 mmol/L — AB (ref 101–111)
CO2: 22 mmol/L (ref 22–32)
Calcium: 8.8 mg/dL — ABNORMAL LOW (ref 8.9–10.3)
Creatinine, Ser: 1.69 mg/dL — ABNORMAL HIGH (ref 0.61–1.24)
GFR calc Af Amer: 45 mL/min — ABNORMAL LOW (ref >60)
GFR, EST NON AFRICAN AMERICAN: 39 mL/min — AB (ref >60)
Glucose, Bld: 89 mg/dL (ref 65–99)
POTASSIUM: 5.7 mmol/L — AB (ref 3.5–5.1)
Sodium: 145 mmol/L (ref 135–145)
Total Bilirubin: <0.1 mg/dL — ABNORMAL LOW (ref 0.3–1.2)
Total Protein: 6.3 g/dL — ABNORMAL LOW (ref 6.5–8.1)

## 2015-07-29 LAB — BASIC METABOLIC PANEL
ANION GAP: 5 (ref 5–15)
ANION GAP: 8 (ref 5–15)
BUN: 43 mg/dL — AB (ref 6–20)
BUN: 43 mg/dL — ABNORMAL HIGH (ref 6–20)
CALCIUM: 8.5 mg/dL — AB (ref 8.9–10.3)
CHLORIDE: 114 mmol/L — AB (ref 101–111)
CO2: 23 mmol/L (ref 22–32)
CO2: 22 mmol/L (ref 22–32)
CREATININE: 1.72 mg/dL — AB (ref 0.61–1.24)
Calcium: 8.8 mg/dL — ABNORMAL LOW (ref 8.9–10.3)
Chloride: 113 mmol/L — ABNORMAL HIGH (ref 101–111)
Creatinine, Ser: 1.69 mg/dL — ABNORMAL HIGH (ref 0.61–1.24)
GFR calc non Af Amer: 39 mL/min — ABNORMAL LOW (ref >60)
GFR, EST AFRICAN AMERICAN: 44 mL/min — AB (ref >60)
GFR, EST AFRICAN AMERICAN: 45 mL/min — AB (ref >60)
GFR, EST NON AFRICAN AMERICAN: 38 mL/min — AB (ref >60)
GLUCOSE: 89 mg/dL (ref 65–99)
GLUCOSE: 136 mg/dL — AB (ref 65–99)
POTASSIUM: 5.6 mmol/L — AB (ref 3.5–5.1)
Potassium: 4.3 mmol/L (ref 3.5–5.1)
Sodium: 141 mmol/L (ref 135–145)
Sodium: 144 mmol/L (ref 135–145)

## 2015-07-29 LAB — IRON AND TIBC
IRON: 29 ug/dL — AB (ref 45–182)
SATURATION RATIOS: 12 % — AB (ref 17.9–39.5)
TIBC: 234 ug/dL — AB (ref 250–450)
UIBC: 205 ug/dL

## 2015-07-29 LAB — CBC
HEMATOCRIT: 22.5 % — AB (ref 39.0–52.0)
Hemoglobin: 7.6 g/dL — ABNORMAL LOW (ref 13.0–17.0)
MCH: 28.8 pg (ref 26.0–34.0)
MCHC: 33.8 g/dL (ref 30.0–36.0)
MCV: 85.2 fL (ref 78.0–100.0)
Platelets: 279 10*3/uL (ref 150–400)
RBC: 2.64 MIL/uL — AB (ref 4.22–5.81)
RDW: 15.1 % (ref 11.5–15.5)
WBC: 5.6 10*3/uL (ref 4.0–10.5)

## 2015-07-29 LAB — FERRITIN: Ferritin: 449 ng/mL — ABNORMAL HIGH (ref 24–336)

## 2015-07-29 LAB — HEMOGLOBIN AND HEMATOCRIT, BLOOD
HCT: 29.9 % — ABNORMAL LOW (ref 39.0–52.0)
HEMOGLOBIN: 10 g/dL — AB (ref 13.0–17.0)

## 2015-07-29 LAB — PREPARE RBC (CROSSMATCH)

## 2015-07-29 LAB — RETICULOCYTES
RBC.: 2.81 MIL/uL — ABNORMAL LOW (ref 4.22–5.81)
Retic Count, Absolute: 28.1 10*3/uL (ref 19.0–186.0)
Retic Ct Pct: 1 % (ref 0.4–3.1)

## 2015-07-29 LAB — VITAMIN B12: VITAMIN B 12: 279 pg/mL (ref 180–914)

## 2015-07-29 LAB — GLUCOSE, CAPILLARY: Glucose-Capillary: 78 mg/dL (ref 65–99)

## 2015-07-29 LAB — FOLATE: FOLATE: 22.3 ng/mL (ref >5.9)

## 2015-07-29 LAB — ABO/RH: ABO/RH(D): O POS

## 2015-07-29 SURGERY — BIOPSY, PROSTATE
Anesthesia: General

## 2015-07-29 MED ORDER — SODIUM CHLORIDE 0.9 % IV SOLN
Freq: Once | INTRAVENOUS | Status: AC
  Administered 2015-07-29: 12:00:00 via INTRAVENOUS

## 2015-07-29 MED ORDER — SODIUM POLYSTYRENE SULFONATE 15 GM/60ML PO SUSP
45.0000 g | Freq: Once | ORAL | Status: AC
  Administered 2015-07-29: 45 g via ORAL
  Filled 2015-07-29: qty 180

## 2015-07-29 MED ORDER — SODIUM CHLORIDE 0.45 % IV SOLN
INTRAVENOUS | Status: DC
  Administered 2015-07-29 (×2): via INTRAVENOUS
  Filled 2015-07-29 (×2): qty 1000

## 2015-07-29 MED ORDER — SODIUM CHLORIDE 0.45 % IV SOLN
INTRAVENOUS | Status: DC
  Administered 2015-07-30 – 2015-07-31 (×3): via INTRAVENOUS

## 2015-07-29 MED ORDER — CEFAZOLIN SODIUM-DEXTROSE 2-4 GM/100ML-% IV SOLN: 2.0000 g | INTRAVENOUS | Status: DC

## 2015-07-29 MED ORDER — FUROSEMIDE 10 MG/ML IJ SOLN: 20.0000 mg | Freq: Once | INTRAMUSCULAR | Status: DC

## 2015-07-29 MED ORDER — DIPHENHYDRAMINE HCL 25 MG PO CAPS
25.0000 mg | ORAL_CAPSULE | Freq: Once | ORAL | Status: AC
  Administered 2015-07-29: 25 mg via ORAL
  Filled 2015-07-29: qty 1

## 2015-07-29 MED ORDER — ACETAMINOPHEN 325 MG PO TABS
650.0000 mg | ORAL_TABLET | Freq: Once | ORAL | Status: AC
  Administered 2015-07-29: 650 mg via ORAL
  Filled 2015-07-29: qty 2

## 2015-07-29 NOTE — Progress Notes (Signed)
OT Cancellation Note  Patient Details Name: Joel [name] MRN: [id number] DOB: 1940/09/11  .   Cancelled Treatment:    Reason Eval/Treat Not Completed: PT screened, no needs identified, will sign off . Pt is independent per PT  Onofrio Klemp 07/29/2015, 12:23 PM  Lesle Chris, OTR/L [phone] 07/29/2015

## 2015-07-29 NOTE — Progress Notes (Signed)
Patient ID: Joel [name], male   DOB: 1940/06/25, 75 y.o.   MRN: [id number]    Subjective: Mr. Albanez was asked to go to the hospital for evaluation last night when his preop labs showed an elevated potassium on 6.4.   He has a recent history of urinary retention with obstructive uropathy and an elevated PSA of 95 and a prostate induration.  He was to have a prostate biopsy and TURP today but that has been rescheduled for tomorrow morning to allow for management of the hyperkalemia and a blood transfusion for his anemia of chronic disease.   ROS:  Review of Systems  Constitutional: Negative for fever and chills.  Respiratory: Negative for shortness of breath.   Cardiovascular: Negative for chest pain and leg swelling.  Gastrointestinal: Negative for abdominal pain.    Anti-infectives: Anti-infectives    Start     Dose/Rate Route Frequency Ordered Stop   07/30/15 0600  ceFAZolin (ANCEF) IVPB 2g/100 mL premix     2 g 200 mL/hr over 30 Minutes Intravenous 30 min pre-op 07/29/15 1520     07/29/15 0048  ceFAZolin (ANCEF) IVPB 2g/100 mL premix  Status:  Discontinued     2 g 200 mL/hr over 30 Minutes Intravenous 30 min pre-op 07/29/15 0048 07/29/15 1540   07/28/15 2145  levofloxacin (LEVAQUIN) tablet 500 mg     500 mg Oral Daily 07/28/15 2141        Current Facility-Administered Medications  Medication Dose Route Frequency Provider Last Rate Last Dose  . 0.45 % sodium chloride infusion   Intravenous Continuous Eugenie Filler, MD 75 mL/hr at 07/29/15 1739    . acetaminophen (TYLENOL) tablet 650 mg  650 mg Oral Q6H PRN Ivor Costa, MD       Or  . acetaminophen (TYLENOL) suppository 650 mg  650 mg Rectal Q6H PRN Ivor Costa, MD      . Derrill Memo ON 07/30/2015] ceFAZolin (ANCEF) IVPB 2g/100 mL premix  2 g Intravenous 30 min Pre-Op Irine Seal, MD      . feeding supplement (NEPRO CARB STEADY) liquid 237 mL  237 mL Oral TID BM Ivor Costa, MD 0 mL/hr at 07/28/15 2217 237 mL at 07/29/15 1000  .  levofloxacin (LEVAQUIN) tablet 500 mg  500 mg Oral Daily Ivor Costa, MD   500 mg at 07/29/15 1004  . ondansetron (ZOFRAN) tablet 4 mg  4 mg Oral Q6H PRN Ivor Costa, MD       Or  . ondansetron [facility]) injection 4 mg  4 mg Intravenous Q6H PRN Ivor Costa, MD      . sodium chloride flush (NS) 0.9 % injection 3 mL  3 mL Intravenous Q12H Ivor Costa, MD   3 mL at 07/29/15 1000  . tamsulosin (FLOMAX) capsule 0.4 mg  0.4 mg Oral Daily Ivor Costa, MD   0.4 mg at 07/29/15 1004     Objective: Vital signs in last 24 hours: Temp:  [97.3 F (36.3 C)-98.5 F (36.9 C)] 98 F (36.7 C) (04/07 1736) Pulse Rate:  [64-99] 81 (04/07 1736) Resp:  [12-23] 18 (04/07 1736) BP: (93-142)/(50-85) 112/76 mmHg (04/07 1736) SpO2:  [98 %-100 %] 100 % (04/07 1736) Weight:  [59.5 kg (131 lb 2.8 oz)] 59.5 kg (131 lb 2.8 oz) (04/07 0050)  Intake/Output from previous day: 04/06 0701 - 04/07 0700 In: -  Out: 1750 [Urine:1750] Intake/Output this shift: Total I/O In: 1528 [P.O.:240; Blood:1288] Out: 2200 [Urine:2200]   Physical Exam  Constitutional:  Cachectic but  well developed male in NAD.   Genitourinary:  He has an indwelling foley draining clear urine.   Vitals reviewed.   Lab Results:   Recent Labs  07/28/15 1120 07/29/15 0512  WBC 6.2 5.6  HGB 7.9* 7.6*  HCT 24.3* 22.5*  PLT 318 279   BMET  Recent Labs  07/29/15 0512 07/29/15 0938  NA 145 141  K 5.7* 5.6*  CL 117* 113*  CO2 22 23  GLUCOSE 89 89  BUN 45* 43*  CREATININE 1.69* 1.72*  CALCIUM 8.8* 8.8*   PT/INR  Recent Labs  07/28/15 2127  LABPROT 15.9*  INR 1.31   ABG No results for input(s): PHART, HCO3 in the last 72 hours.  Invalid input(s): PCO2, PO2  Studies/Results: No results found.   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 Hyperkalemia. Chronic anemia.  I will proceed with prostate biopsy and TURP in the morning if he potassium remains down.        LOS: 1 day     Haylo Fake J 07/29/2015 [phone]

## 2015-07-29 NOTE — Progress Notes (Signed)
[facility] PROGRESS NOTE  Joel [name] [id number] DOB: Nov 30, 1940 DOA: 07/28/2015 PCP: [name], MD  Assessment/Plan: #1 hyperkalemia Likely secondary to acute on chronic kidney disease. Patient received a dose of Kayexalate in the emergency room. EKG with no peaked T waves. Patient currently asymptomatic. Repeat basic metabolic profile still with a elevated potassium now at 5.6. Will give another dose of Kayexalate. Follow.  #2 prostate enlargement with elevated PSA/urinary retention Patient Followed up with urology and was scheduled for biopsy today however preop labs with her hyperkalemia and a such biopsy has been canceled and to be rescheduled hopefully tomorrow once hyperkalemia has resolved. Continue oral Levaquin. Continue Flomax. Urology consultation pending.  #3 anemia Likely an anemia of chronic disease likely secondary to probable chronic kidney disease. Patient with no overt bleeding. Anemia panel with iron level of 29 TIBC of 234 with a ferritin level of 449 and a folate level of 22.3. B-12 levels at 279. Will transfuse 2 units of packed red blood cells. Follow H&H.  #4 acute on chronic kidney disease Likely secondary to obstructive uropathy secondary to prostate enlargement. Foley catheter placed. Unknown baseline for patient's chronic kidney disease however patient recently discharged on 07/13/2015 at which point in time his creatinine was 2.88 from 8.06 on 07/08/2015. Renal function continuing to trend down and currently at 1.72 today. Continue gentle hydration. Follow.  #5 severe protein calorie malnuitrition Nutritional supplementation.  #6 prophylaxis SCDs for DVT prophylaxis.  Code Status: Full Family Communication: Updated patient. No family at bedside. Disposition Plan: Pending urology evaluation for possible prostate biopsy tomorrow and resolution of hyperkalemia.   Consultants:  Urology: Dr Jeffie Pollock 07/29/2015  Procedures:  2 units packed red  blood cells 07/29/2015    Antibiotics:  Oral Levaquin 07/28/2015  HPI/Subjective: Patient denies any chest pain. No shortness of breath. No abdominal pain. No overt bleeding. No dizziness.  Objective: Filed Vitals:   07/29/15 1453 07/29/15 1528  BP: 95/67 105/66  Pulse: 91 88  Temp: 98.2 F (36.8 C) 98.5 F (36.9 C)  Resp: 20 18    Intake/Output Summary (Last 24 hours) at 07/29/15 1607 Last data filed at 07/29/15 1528  Gross per 24 hour  Intake   1528 ml  Output   1750 ml  Net   -222 ml   Filed Weights   07/29/15 0050  Weight: 59.5 kg (131 lb 2.8 oz)    Exam:   General:  NAD  Cardiovascular: RRR  Respiratory: CTAB  Abdomen:  Soft, nontender, nondistended, positive bowel sounds.  Musculoskeletal: No clubbing cyanosis or edema.  Data Reviewed: Basic Metabolic Panel:  Recent Labs Lab 07/28/15 1120 07/28/15 2013 07/29/15 0512 07/29/15 0938  NA 139 141 145 141  K 6.4* 6.1* 5.7* 5.6*  CL 111 113* 117* 113*  CO2 20* 20* 22 23  GLUCOSE 95 90 89 89  BUN 48* 50* 45* 43*  CREATININE 1.92* 1.81* 1.69* 1.72*  CALCIUM 8.9 9.0 8.8* 8.8*   Liver Function Tests:  Recent Labs Lab 07/29/15 0512  AST 22  ALT 29  ALKPHOS 53  BILITOT <0.1*  PROT 6.3*  ALBUMIN 3.2*   No results for input(s): LIPASE, AMYLASE in the last 168 hours. No results for input(s): AMMONIA in the last 168 hours. CBC:  Recent Labs Lab 07/28/15 1120 07/29/15 0512  WBC 6.2 5.6  HGB 7.9* 7.6*  HCT 24.3* 22.5*  MCV 86.5 85.2  PLT 318 279   Cardiac Enzymes: No results for input(s): CKTOTAL, CKMB, CKMBINDEX, TROPONINI  in the last 168 hours. BNP (last 3 results)  Recent Labs  07/08/15 0106  BNP 104.0*    ProBNP (last 3 results) No results for input(s): PROBNP in the last 8760 hours.  CBG:  Recent Labs Lab 07/29/15 0730  GLUCAP 78    No results found for this or any previous visit (from the past 240 hour(s)).   Studies: No results found.  Scheduled Meds: .  [START ON 07/30/2015]  ceFAZolin (ANCEF) IV  2 g Intravenous 30 min Pre-Op  . feeding supplement (NEPRO CARB STEADY)  237 mL Oral TID BM  . levofloxacin  500 mg Oral Daily  . sodium chloride flush  3 mL Intravenous Q12H  . tamsulosin  0.4 mg Oral Daily   Continuous Infusions: . sodium chloride      Principal Problem:   Hyperkalemia Active Problems:   Normocytic anemia   Protein-calorie malnutrition, severe (HCC)   Elevated prostate specific antigen (PSA)   CKD (chronic kidney disease), stage IV (HCC)   Urinary retention   Anemia    Time spent: 44 minutes    THOMPSON,DANIEL M.D. [facility] Pager [phone]. If 7PM-7AM, please contact night-coverage at [email or web address], password [facility] 07/29/2015, 4:07 PM  LOS: 1 day

## 2015-07-29 NOTE — Anesthesia Preprocedure Evaluation (Addendum)
Anesthesia Evaluation  Patient identified by MRN, date of birth, ID band Patient awake    Reviewed: Allergy & Precautions, H&P , NPO status , Patient's Chart, lab work & pertinent test results, reviewed documented beta blocker date and time   Airway Mallampati: I  TM Distance: >3 FB Neck ROM: full    Dental no notable dental hx. (+) Missing, Chipped, Loose, Edentulous Upper, Poor Dentition Poor dentition:   Pulmonary former smoker,    Pulmonary exam normal        Cardiovascular Exercise Tolerance: Good negative cardio ROS Normal cardiovascular exam     Neuro/Psych negative neurological ROS  negative psych ROS   GI/Hepatic negative GI ROS, Neg liver ROS,   Endo/Other  negative endocrine ROS  Renal/GU   negative genitourinary   Musculoskeletal   Abdominal Normal abdominal exam  (+)   Peds  Hematology   Anesthesia Other Findings   Reproductive/Obstetrics negative OB ROS                         Anesthesia Physical Anesthesia Plan  ASA: II  Anesthesia Plan: General   Post-op Pain Management:    Induction: Intravenous  Airway Management Planned: LMA  Additional Equipment:   Intra-op Plan:   Post-operative Plan:   Informed Consent: I have reviewed the patients History and Physical, chart, labs and discussed the procedure including the risks, benefits and alternatives for the proposed anesthesia with the patient or authorized representative who has indicated his/her understanding and acceptance.     Plan Discussed with: CRNA and Surgeon  Anesthesia Plan Comments:        Anesthesia Quick Evaluation

## 2015-07-29 NOTE — Evaluation (Signed)
Physical Therapy Evaluation Patient Details Name: Joel [name] MRN: [id number] DOB: 07-Dec-1940 Today's Date: 07/29/2015   History of Present Illness  Pt admitted with elevated Potassium level discovered in pre-op for biopsy.    Clinical Impression  Pt admitted as above and presenting with functional mobility limitations 2* generalized weakness and mild ambulatory balance deficits.  Pt should progress to dc home with family assist.    Follow Up Recommendations No PT follow up    Equipment Recommendations  None recommended by PT    Recommendations for Other Services       Precautions / Restrictions Precautions Precautions: Fall Precaution Comments: Pt c/o sore heels laying in bed.  Please elevate heels.  RN aware Restrictions Weight Bearing Restrictions: No      Mobility  Bed Mobility Overal bed mobility: Modified Independent Bed Mobility: Supine to Sit     Supine to sit: Modified independent (Device/Increase time)     General bed mobility comments: Pt unassisted to EOB  Transfers Overall transfer level: Needs assistance Equipment used: None Transfers: Sit to/from Stand Sit to Stand: Min guard         General transfer comment: Mild instability with initial standing  Ambulation/Gait Ambulation/Gait assistance: Min assist;Min guard Ambulation Distance (Feet): 240 Feet Assistive device: None Gait Pattern/deviations: Step-through pattern;Shuffle;Trunk flexed;Narrow base of support     General Gait Details: Cues to increase B step length, no scissoring noted. Mild initial instability improved with increased distance.  Pt able to side step, back step and stork stand with min guard  Stairs            Wheelchair Mobility    Modified Rankin (Stroke Patients Only)       Balance Overall balance assessment: Needs assistance Sitting-balance support: Feet supported;No upper extremity supported Sitting balance-Leahy Scale: Normal     Standing balance  support: No upper extremity supported Standing balance-Leahy Scale: Fair                               Pertinent Vitals/Pain Pain Assessment: 0-10 Pain Score: 2  Pain Location: Bil heels` Pain Descriptors / Indicators: Sore;Tender Pain Intervention(s): Other (comment) (Reviewed need to elevate heels in bed, RN aware)    Home Living Family/patient expects to be discharged to:: Private residence Living Arrangements: Spouse/significant other Available Help at Discharge: Family Type of Home: House Home Access: Stairs to enter   Technical brewer of Steps: 5 Home Layout: One level Home Equipment: None      Prior Function Level of Independence: Independent         Comments: Mowed dtrs lawn day before admission     Hand Dominance        Extremity/Trunk Assessment   Upper Extremity Assessment: Overall WFL for tasks assessed           Lower Extremity Assessment: Generalized weakness      Cervical / Trunk Assessment: Normal  Communication   Communication: No difficulties  Cognition Arousal/Alertness: Awake/alert Behavior During Therapy: WFL for tasks assessed/performed Overall Cognitive Status: Within Functional Limits for tasks assessed                      General Comments      Exercises        Assessment/Plan    PT Assessment Patient needs continued PT services  PT Diagnosis Generalized weakness;Difficulty walking   PT Problem List Decreased balance;Decreased strength  PT Treatment Interventions  Gait training;Balance training   PT Goals (Current goals can be found in the Care Plan section) Acute Rehab PT Goals Patient Stated Goal: to go home PT Goal Formulation: With patient Time For Goal Achievement: 08/05/15 Potential to Achieve Goals: Good    Frequency Min 3X/week   Barriers to discharge        Co-evaluation               End of Session Equipment Utilized During Treatment: Gait belt Activity Tolerance:  Patient tolerated treatment well Patient left: in chair;with family/visitor present;with call bell/phone within reach Nurse Communication: Mobility status         Time: 1020-1040 PT Time Calculation (min) (ACUTE ONLY): 20 min   Charges:   PT Evaluation $PT Eval Low Complexity: 1 Procedure     PT G Codes:        Cristle Jared 08/17/15, 1:01 PM

## 2015-07-30 ENCOUNTER — Inpatient Hospital Stay (HOSPITAL_COMMUNITY): Payer: Medicare HMO | Admitting: Anesthesiology

## 2015-07-30 ENCOUNTER — Encounter (HOSPITAL_COMMUNITY)
Admission: EM | Disposition: A | Discharge status: Home / Self Care | Payer: Self-pay | Source: Home / Self Care | Attending: Internal Medicine

## 2015-07-30 DIAGNOSIS — R972 Elevated prostate specific antigen [PSA]: Secondary | ICD-10-CM

## 2015-07-30 HISTORY — PX: TRANSURETHRAL RESECTION OF PROSTATE: SHX73

## 2015-07-30 HISTORY — PX: PROSTATE BIOPSY: SHX241

## 2015-07-30 LAB — BASIC METABOLIC PANEL
Anion gap: 8 (ref 5–15)
BUN: 42 mg/dL — AB (ref 6–20)
CHLORIDE: 111 mmol/L (ref 101–111)
CO2: 21 mmol/L — AB (ref 22–32)
CREATININE: 1.78 mg/dL — AB (ref 0.61–1.24)
Calcium: 8.2 mg/dL — ABNORMAL LOW (ref 8.9–10.3)
GFR calc non Af Amer: 36 mL/min — ABNORMAL LOW (ref >60)
GFR, EST AFRICAN AMERICAN: 42 mL/min — AB (ref >60)
Glucose, Bld: 95 mg/dL (ref 65–99)
POTASSIUM: 3.8 mmol/L (ref 3.5–5.1)
SODIUM: 140 mmol/L (ref 135–145)

## 2015-07-30 LAB — CBC
HEMATOCRIT: 27.4 % — AB (ref 39.0–52.0)
HEMOGLOBIN: 9.1 g/dL — AB (ref 13.0–17.0)
MCH: 28.6 pg (ref 26.0–34.0)
MCHC: 33.2 g/dL (ref 30.0–36.0)
MCV: 86.2 fL (ref 78.0–100.0)
Platelets: 270 10*3/uL (ref 150–400)
RBC: 3.18 MIL/uL — AB (ref 4.22–5.81)
RDW: 15.6 % — ABNORMAL HIGH (ref 11.5–15.5)
WBC: 6 10*3/uL (ref 4.0–10.5)

## 2015-07-30 SURGERY — BIOPSY, PROSTATE
Anesthesia: General

## 2015-07-30 MED ORDER — ACETAMINOPHEN 325 MG PO TABS: 325.0000 mg | ORAL_TABLET | ORAL | Status: DC | PRN

## 2015-07-30 MED ORDER — EPHEDRINE SULFATE 50 MG/ML IJ SOLN
INTRAMUSCULAR | Status: DC | PRN
  Administered 2015-07-30 (×5): 5 mg via INTRAVENOUS

## 2015-07-30 MED ORDER — SODIUM CHLORIDE 0.9 % IR SOLN
Status: DC | PRN
  Administered 2015-07-30 (×4): 3000 mL

## 2015-07-30 MED ORDER — MEPERIDINE HCL 25 MG/ML IJ SOLN: 6.2500 mg | INTRAMUSCULAR | Status: DC | PRN

## 2015-07-30 MED ORDER — FENTANYL CITRATE (PF) 250 MCG/5ML IJ SOLN
INTRAMUSCULAR | Status: DC | PRN
  Administered 2015-07-30 (×3): 25 ug via INTRAVENOUS
  Administered 2015-07-30: 50 ug via INTRAVENOUS
  Administered 2015-07-30: 25 ug via INTRAVENOUS

## 2015-07-30 MED ORDER — HYDROCODONE-ACETAMINOPHEN 5-325 MG PO TABS: 1.0000 | ORAL_TABLET | Freq: Four times a day (QID) | ORAL | Status: DC | PRN

## 2015-07-30 MED ORDER — LIDOCAINE HCL (CARDIAC) 20 MG/ML IV SOLN
INTRAVENOUS | Status: DC | PRN
  Administered 2015-07-30: 40 mg via INTRAVENOUS

## 2015-07-30 MED ORDER — ACETAMINOPHEN 160 MG/5ML PO SOLN: 325.0000 mg | ORAL | Status: DC | PRN

## 2015-07-30 MED ORDER — PHENYLEPHRINE HCL 10 MG/ML IJ SOLN
INTRAMUSCULAR | Status: DC | PRN
  Administered 2015-07-30 (×4): 80 ug via INTRAVENOUS

## 2015-07-30 MED ORDER — FENTANYL CITRATE (PF) 100 MCG/2ML IJ SOLN: 25.0000 ug | INTRAMUSCULAR | Status: DC | PRN

## 2015-07-30 MED ORDER — MIDAZOLAM HCL 5 MG/5ML IJ SOLN
INTRAMUSCULAR | Status: DC | PRN
  Administered 2015-07-30: 1 mg via INTRAVENOUS

## 2015-07-30 MED ORDER — PROPOFOL 10 MG/ML IV BOLUS
INTRAVENOUS | Status: DC | PRN
  Administered 2015-07-30: 120 mg via INTRAVENOUS

## 2015-07-30 MED ORDER — CEFAZOLIN SODIUM-DEXTROSE 2-3 GM-% IV SOLR
INTRAVENOUS | Status: DC | PRN
  Administered 2015-07-30: 2 g via INTRAVENOUS

## 2015-07-30 MED ORDER — HYDROCODONE-ACETAMINOPHEN 7.5-325 MG PO TABS: 1.0000 | ORAL_TABLET | Freq: Once | ORAL | Status: DC | PRN

## 2015-07-30 MED ORDER — ONDANSETRON HCL 4 MG/2ML IJ SOLN
4.0000 mg | Freq: Once | INTRAMUSCULAR | Status: AC | PRN
  Administered 2015-07-30: 4 mg via INTRAVENOUS

## 2015-07-30 MED ORDER — SODIUM CHLORIDE 0.9 % IV SOLN
INTRAVENOUS | Status: DC | PRN
  Administered 2015-07-30 (×2): via INTRAVENOUS

## 2015-07-30 MED ORDER — SODIUM CHLORIDE 0.9 % IR SOLN
3000.0000 mL | Status: DC
  Administered 2015-07-30 (×2): 3000 mL via INTRAVESICAL

## 2015-07-30 MED ORDER — SODIUM CHLORIDE 0.9 % IR SOLN
Status: DC | PRN
  Administered 2015-07-30: 1000 mL via INTRAVESICAL

## 2015-07-30 SURGICAL SUPPLY — 19 items
BAG URINE DRAINAGE (UROLOGICAL SUPPLIES) ×3 IMPLANT
BAG URO CATCHER STRL LF (MISCELLANEOUS) ×3 IMPLANT
CATH FOLEY 3WAY 30CC 22FR (CATHETERS) ×3 IMPLANT
CATH URET 5FR 28IN OPEN ENDED (CATHETERS) IMPLANT
ELECT REM PT RETURN 9FT ADLT (ELECTROSURGICAL) ×3
ELECTRODE REM PT RTRN 9FT ADLT (ELECTROSURGICAL) ×1 IMPLANT
GLOVE SURG SS PI 8.0 STRL IVOR (GLOVE) ×9 IMPLANT
GOWN STRL REUS W/TWL XL LVL3 (GOWN DISPOSABLE) ×3 IMPLANT
HOLDER FOLEY CATH W/STRAP (MISCELLANEOUS) ×3 IMPLANT
KIT ASPIRATION TUBING (SET/KITS/TRAYS/PACK) ×3 IMPLANT
LOOP CUT BIPOLAR 24F LRG (ELECTROSURGICAL) ×3 IMPLANT
MANIFOLD NEPTUNE II (INSTRUMENTS) ×3 IMPLANT
PACK CYSTO (CUSTOM PROCEDURE TRAY) ×3 IMPLANT
SYR 30ML LL (SYRINGE) ×3 IMPLANT
SYR CONTROL 10ML LL (SYRINGE) ×6 IMPLANT
SYRINGE IRR TOOMEY STRL 70CC (SYRINGE) ×3 IMPLANT
TUBING CONNECTING 10 (TUBING) ×2 IMPLANT
TUBING CONNECTING 10' (TUBING) ×1
UNDERPAD 30X30 INCONTINENT (UNDERPADS AND DIAPERS) IMPLANT

## 2015-07-30 NOTE — Anesthesia Procedure Notes (Signed)
Procedure Name: LMA Insertion Date/Time: 07/30/2015 7:35 AM Performed by: Cynda Familia Pre-anesthesia Checklist: Patient identified, Emergency Drugs available, Suction available and Patient being monitored Patient Re-evaluated:Patient Re-evaluated prior to inductionOxygen Delivery Method: Circle System Utilized Preoxygenation: Pre-oxygenation with 100% oxygen Intubation Type: IV induction Ventilation: Mask ventilation without difficulty LMA: LMA inserted LMA Size: 4.0 Tube type: Oral Number of attempts: 1 Placement Confirmation: positive ETCO2 and breath sounds checked- equal and bilateral Tube secured with: Tape Dental Injury: Teeth and Oropharynx as per pre-operative assessment  Comments: IV induction AM- LMA AM CRNA- atraumatic- teeth and mouth as preop- many missing teeth, chipped teeth- very poor dentition- [place]  present

## 2015-07-30 NOTE — Progress Notes (Signed)
[facility] PROGRESS NOTE  Joel [name] [id number] DOB: 1940-08-16 DOA: 07/28/2015 PCP: [name], MD  Assessment/Plan: #1 hyperkalemia Likely secondary to acute on chronic kidney disease. Patient received a dose of Kayexalate in the emergency room. EKG with no peaked T waves. Patient currently asymptomatic. Repeat basic metabolic profile still with a elevated potassium now at 5.6 yesterday 07/29/2015. Patient received another dose of Kayexalate and hyperkalemia seems to have resolved. Follow.   #2 prostate enlargement with elevated PSA/urinary retention Patient followed up with urology and was scheduled for biopsy today however preop labs with hyperkalemia. Patient status post TURP with biopsies done per urology today. Continue oral Levaquin. Continue Flomax. Urology ff  #3 anemia Likely an anemia of chronic disease likely secondary to probable chronic kidney disease. Patient with no overt bleeding. Anemia panel with iron level of 29 TIBC of 234 with a ferritin level of 449 and a folate level of 22.3. B-12 levels at 279. S/P 2 units of packed red blood cells. Follow H&H.  #4 acute on chronic kidney disease Likely secondary to obstructive uropathy secondary to prostate enlargement. Foley catheter placed. Unknown baseline for patient's chronic kidney disease however patient recently discharged on 07/13/2015 at which point in time his creatinine was 2.88 from 8.06 on 07/08/2015. Renal function continuing to trend down and currently at 1.78 today. Creatinine seems to have plateaued. Continue gentle hydration. Follow.  #5 severe protein calorie malnuitrition Nutritional supplementation.  #6 prophylaxis SCDs for DVT prophylaxis.  Code Status: Full Family Communication: Updated patient. No family at bedside. Disposition Plan: Home with resolution of hyperkalemia and per urology.   Consultants:  Urology: Dr Jeffie Pollock 07/29/2015  Procedures:  2 units packed red blood cells  07/29/2015  TURP per Dr. Jeffie Pollock 07/30/2015  Antibiotics:  Oral Levaquin 07/28/2015  HPI/Subjective: Patient denies any chest pain. No shortness of breath. No abdominal pain. Patient status post TURP, patient now with pink colored urine and Foley catheter bag.   Objective: Filed Vitals:   07/30/15 1010 07/30/15 1500  BP: 129/73 99/58  Pulse: 74 84  Temp: 97.4 F (36.3 C) 97.5 F (36.4 C)  Resp: 14 18    Intake/Output Summary (Last 24 hours) at 07/30/15 1526 Last data filed at 07/30/15 1437  Gross per 24 hour  Intake   2409 ml  Output   5250 ml  Net  -2841 ml   Filed Weights   07/29/15 0050  Weight: 59.5 kg (131 lb 2.8 oz)    Exam:   General:  NAD  Cardiovascular: RRR  Respiratory: CTAB  Abdomen:  Soft, nontender, nondistended, positive bowel sounds.  Musculoskeletal: No clubbing cyanosis or edema.  Data Reviewed: Basic Metabolic Panel:  Recent Labs Lab 07/28/15 2013 07/29/15 0512 07/29/15 0938 07/29/15 1922 07/30/15 0544  NA 141 145 141 144 140  K 6.1* 5.7* 5.6* 4.3 3.8  CL 113* 117* 113* 114* 111  CO2 20* 22 23 22  21*  GLUCOSE 90 89 89 136* 95  BUN 50* 45* 43* 43* 42*  CREATININE 1.81* 1.69* 1.72* 1.69* 1.78*  CALCIUM 9.0 8.8* 8.8* 8.5* 8.2*   Liver Function Tests:  Recent Labs Lab 07/29/15 0512  AST 22  ALT 29  ALKPHOS 53  BILITOT <0.1*  PROT 6.3*  ALBUMIN 3.2*   No results for input(s): LIPASE, AMYLASE in the last 168 hours. No results for input(s): AMMONIA in the last 168 hours. CBC:  Recent Labs Lab 07/28/15 1120 07/29/15 0512 07/29/15 1922 07/30/15 0544  WBC 6.2 5.6  --  6.0  HGB 7.9* 7.6* 10.0* 9.1*  HCT 24.3* 22.5* 29.9* 27.4*  MCV 86.5 85.2  --  86.2  PLT 318 279  --  270   Cardiac Enzymes: No results for input(s): CKTOTAL, CKMB, CKMBINDEX, TROPONINI in the last 168 hours. BNP (last 3 results)  Recent Labs  07/08/15 0106  BNP 104.0*    ProBNP (last 3 results) No results for input(s): PROBNP in the last 8760  hours.  CBG:  Recent Labs Lab 07/29/15 0730  GLUCAP 78    No results found for this or any previous visit (from the past 240 hour(s)).   Studies: No results found.  Scheduled Meds: . feeding supplement (NEPRO CARB STEADY)  237 mL Oral TID BM  . levofloxacin  500 mg Oral Daily  . sodium chloride flush  3 mL Intravenous Q12H  . sodium chloride irrigation  3,000 mL Bladder Irrigation Q24H  . tamsulosin  0.4 mg Oral Daily   Continuous Infusions: . sodium chloride 75 mL/hr at 07/30/15 0502    Principal Problem:   Hyperkalemia Active Problems:   Normocytic anemia   Protein-calorie malnutrition, severe (HCC)   Elevated prostate specific antigen (PSA)   CKD (chronic kidney disease), stage IV (HCC)   Urinary retention   Anemia   Elevated serum creatinine    Time spent: 60 minutes    THOMPSON,DANIEL M.D. [facility] Pager [phone]. If 7PM-7AM, please contact night-coverage at [email or web address], password [facility] 07/30/2015, 3:26 PM  LOS: 2 days

## 2015-07-30 NOTE — Transfer of Care (Signed)
Immediate Anesthesia Transfer of Care Note  Patient: Joel [name]  Procedure(s) Performed: Procedure(s): PROSTATE BIOPSY (N/A) TRANSURETHRAL RESECTION OF THE PROSTATE (TURP) (N/A)  Patient Location: PACU  Anesthesia Type:General  Level of Consciousness: awake and alert   Airway & Oxygen Therapy: Patient Spontanous Breathing and Patient connected to face mask oxygen  Post-op Assessment: Report given to RN and Post -op Vital signs reviewed and stable  Post vital signs: Reviewed and stable  Last Vitals:  Filed Vitals:   07/29/15 2041 07/30/15 0429  BP: 117/75 115/62  Pulse: 97 71  Temp: 36.6 C 37 C  Resp: 18 18    Complications: No apparent anesthesia complications

## 2015-07-30 NOTE — Anesthesia Postprocedure Evaluation (Signed)
Anesthesia Post Note  Patient: Joel [name]  Procedure(s) Performed: Procedure(s) (LRB): PROSTATE BIOPSY (N/A) TRANSURETHRAL RESECTION OF THE PROSTATE (TURP) (N/A)  Patient location during evaluation: PACU Anesthesia Type: General Level of consciousness: sedated Pain management: pain level controlled Vital Signs Assessment: post-procedure vital signs reviewed and stable Respiratory status: spontaneous breathing Cardiovascular status: stable Postop Assessment: no signs of nausea or vomiting Anesthetic complications: no    Last Vitals:  Filed Vitals:   07/30/15 1000 07/30/15 1010  BP: 134/83 129/73  Pulse: 75 74  Temp: 36.4 C 36.3 C  Resp:  14    Last Pain:  Filed Vitals:   07/30/15 1021  PainSc: [place]

## 2015-07-30 NOTE — Brief Op Note (Signed)
07/28/2015 - 07/30/2015  8:54 AM  PATIENT:  Cyndy Freeze  75 y.o. male  PRE-OPERATIVE DIAGNOSIS:  urinary retention, elevated PSA  POST-OPERATIVE DIAGNOSIS:  BPH with retention, elevated PSA  PROCEDURE:  Procedure(s): PROSTATE BIOPSY (N/A) TRANSURETHRAL RESECTION OF THE PROSTATE (TURP) (N/A)  SURGEON:  Surgeon(s) and Role:    * Irine Seal, MD - Primary  PHYSICIAN ASSISTANT:   ASSISTANTS: none   ANESTHESIA:   general  EBL:  Total I/O In: -  Out: 500 [Urine:300; Blood:200]  BLOOD ADMINISTERED:none  DRAINS: Urinary Catheter (Foley)   LOCAL MEDICATIONS USED:  NONE  SPECIMEN:  Source of Specimen:  6 prostate biopsies and prostate chips.   DISPOSITION OF SPECIMEN:  PATHOLOGY  COUNTS:  YES  TOURNIQUET:  * No tourniquets in log *  DICTATION: .Other Dictation: Dictation Number [id number]  PLAN OF CARE: Admit to inpatient   PATIENT DISPOSITION:  PACU - hemodynamically stable.   Delay start of Pharmacological VTE agent (>24hrs) due to surgical blood loss or risk of bleeding: yes

## 2015-07-31 DIAGNOSIS — D649 Anemia, unspecified: Secondary | ICD-10-CM

## 2015-07-31 LAB — CBC
HEMATOCRIT: 26.8 % — AB (ref 39.0–52.0)
HEMOGLOBIN: 9 g/dL — AB (ref 13.0–17.0)
MCH: 28.5 pg (ref 26.0–34.0)
MCHC: 33.6 g/dL (ref 30.0–36.0)
MCV: 84.8 fL (ref 78.0–100.0)
Platelets: 286 10*3/uL (ref 150–400)
RBC: 3.16 MIL/uL — ABNORMAL LOW (ref 4.22–5.81)
RDW: 15.3 % (ref 11.5–15.5)
WBC: 7.8 10*3/uL (ref 4.0–10.5)

## 2015-07-31 LAB — BASIC METABOLIC PANEL
ANION GAP: 6 (ref 5–15)
BUN: 38 mg/dL — AB (ref 6–20)
CALCIUM: 7.9 mg/dL — AB (ref 8.9–10.3)
CO2: 24 mmol/L (ref 22–32)
Chloride: 114 mmol/L — ABNORMAL HIGH (ref 101–111)
Creatinine, Ser: 1.52 mg/dL — ABNORMAL HIGH (ref 0.61–1.24)
GFR calc Af Amer: 51 mL/min — ABNORMAL LOW (ref >60)
GFR, EST NON AFRICAN AMERICAN: 44 mL/min — AB (ref >60)
GLUCOSE: 102 mg/dL — AB (ref 65–99)
POTASSIUM: 4.3 mmol/L (ref 3.5–5.1)
SODIUM: 144 mmol/L (ref 135–145)

## 2015-07-31 LAB — GLUCOSE, CAPILLARY: GLUCOSE-CAPILLARY: 91 mg/dL (ref 65–99)

## 2015-07-31 MED ORDER — LEVOFLOXACIN 250 MG PO TABS
250.0000 mg | ORAL_TABLET | Freq: Every day | ORAL | Status: DC
  Administered 2015-08-01: 250 mg via ORAL
  Filled 2015-07-31: qty 1

## 2015-07-31 NOTE — Progress Notes (Signed)
[facility] PROGRESS NOTE  Joel [name] [id number] DOB: 04/04/1941 DOA: 07/28/2015 PCP: [name], MD  Assessment/Plan: #1 hyperkalemia Likely secondary to acute on chronic kidney disease. Patient received a dose of Kayexalate in the emergency room. EKG with no peaked T waves. Patient currently asymptomatic. Repeat basic metabolic profile still with a elevated potassium now at 5.6 07/29/2015. Patient received another dose of Kayexalate and hyperkalemia seems to have resolved. Follow.   #2 prostate enlargement with elevated PSA/urinary retention Patient status post TURP with biopsies done per urology 07/30/2015. Pathology pending.Continue oral Levaquin. Continue Flomax. Patient for voiding trial tomorrow per Urology ff  #3 anemia Likely an anemia of chronic disease likely secondary to probable chronic kidney disease. Patient with no overt bleeding. Anemia panel with iron level of 29 TIBC of 234 with a ferritin level of 449 and a folate level of 22.3. B-12 levels at 279. S/P 2 units of packed red blood cells. Hemoglobin stable at 9.0. Follow H&H.  #4 acute on chronic kidney disease Likely secondary to obstructive uropathy secondary to prostate enlargement. Foley catheter placed. Unknown baseline for patient's chronic kidney disease however patient recently discharged on 07/13/2015 at which point in time his creatinine was 2.88 from 8.06 on 07/08/2015. Renal function continuing to trend down and currently at 1.52 today. Creatinine seems to have plateaued. Continue gentle hydration. Follow.  #5 severe protein calorie malnuitrition Nutritional supplementation.  #6 prophylaxis SCDs for DVT prophylaxis.  Code Status: Full Family Communication: Updated patient and wife and daughter at bedside. Disposition Plan: Home with resolution of hyperkalemia and per urology.   Consultants:  Urology: Dr Jeffie Pollock 07/29/2015  Procedures:  2 units packed red blood cells 07/29/2015  TURP per  Dr. Jeffie Pollock 07/30/2015  Antibiotics:  Oral Levaquin 07/28/2015  HPI/Subjective: Patient denies any chest pain. No shortness of breath. No abdominal pain. Patient status post TURP, patient now with pink colored urine and Foley catheter bag. Patient denies any dizziness.  Objective: Filed Vitals:   07/31/15 1415 07/31/15 1437  BP: 106/66 103/63  Pulse: 82 84  Temp: 98.4 F (36.9 C) 98.8 F (37.1 C)  Resp: 16 16    Intake/Output Summary (Last 24 hours) at 07/31/15 1606 Last data filed at 07/31/15 0811  Gross per 24 hour  Intake   4130 ml  Output   7050 ml  Net  -2920 ml   Filed Weights   07/29/15 0050  Weight: 59.5 kg (131 lb 2.8 oz)    Exam:   General:  NAD  Cardiovascular: RRR  Respiratory: CTAB  Abdomen:  Soft, nontender, nondistended, positive bowel sounds.  Musculoskeletal: No clubbing cyanosis or edema.  Data Reviewed: Basic Metabolic Panel:  Recent Labs Lab 07/29/15 0512 07/29/15 0938 07/29/15 1922 07/30/15 0544 07/31/15 0542  NA 145 141 144 140 144  K 5.7* 5.6* 4.3 3.8 4.3  CL 117* 113* 114* 111 114*  CO2 22 23 22  21* 24  GLUCOSE 89 89 136* 95 102*  BUN 45* 43* 43* 42* 38*  CREATININE 1.69* 1.72* 1.69* 1.78* 1.52*  CALCIUM 8.8* 8.8* 8.5* 8.2* 7.9*   Liver Function Tests:  Recent Labs Lab 07/29/15 0512  AST 22  ALT 29  ALKPHOS 53  BILITOT <0.1*  PROT 6.3*  ALBUMIN 3.2*   No results for input(s): LIPASE, AMYLASE in the last 168 hours. No results for input(s): AMMONIA in the last 168 hours. CBC:  Recent Labs Lab 07/28/15 1120 07/29/15 0512 07/29/15 1922 07/30/15 0544 07/31/15 0542  WBC 6.2 5.6  --  6.0 7.8  HGB 7.9* 7.6* 10.0* 9.1* 9.0*  HCT 24.3* 22.5* 29.9* 27.4* 26.8*  MCV 86.5 85.2  --  86.2 84.8  PLT 318 279  --  270 286   Cardiac Enzymes: No results for input(s): CKTOTAL, CKMB, CKMBINDEX, TROPONINI in the last 168 hours. BNP (last 3 results)  Recent Labs  07/08/15 0106  BNP 104.0*    ProBNP (last 3 results) No  results for input(s): PROBNP in the last 8760 hours.  CBG:  Recent Labs Lab 07/29/15 0730 07/31/15 0724  GLUCAP 78 91    No results found for this or any previous visit (from the past 240 hour(s)).   Studies: No results found.  Scheduled Meds: . feeding supplement (NEPRO CARB STEADY)  237 mL Oral TID BM  . [START ON 08/01/2015] levofloxacin  250 mg Oral Daily  . sodium chloride flush  3 mL Intravenous Q12H  . sodium chloride irrigation  3,000 mL Bladder Irrigation Q24H  . tamsulosin  0.4 mg Oral Daily   Continuous Infusions: . sodium chloride 100 mL/hr at 07/31/15 [id number]    Principal Problem:   Hyperkalemia Active Problems:   Normocytic anemia   Protein-calorie malnutrition, severe (HCC)   Elevated prostate specific antigen (PSA)   CKD (chronic kidney disease), stage IV (HCC)   Urinary retention   Anemia   Elevated serum creatinine    Time spent: 64 minutes    Grenda Lora M.D. [facility] Pager [phone]. If 7PM-7AM, please contact night-coverage at [email or web address], password [facility] 07/31/2015, 4:06 PM  LOS: 3 days

## 2015-07-31 NOTE — Progress Notes (Signed)
Patient ID: Joel [name], male   DOB: 1940-07-14, 75 y.o.   MRN: [id number] 1 Day Post-Op  Subjective: Joel [name] doing well post op.  His urine is clear and he has no complaints.  His labs are normalizing and his Hgb is 9.   ROS:  Review of Systems  All other systems reviewed and are negative.   Anti-infectives: Anti-infectives    Start     Dose/Rate Route Frequency Ordered Stop   07/30/15 0600  ceFAZolin (ANCEF) IVPB 2g/100 mL premix  Status:  Discontinued     2 g 200 mL/hr over 30 Minutes Intravenous 30 min pre-op 07/29/15 1520 07/30/15 1027   07/29/15 0048  ceFAZolin (ANCEF) IVPB 2g/100 mL premix  Status:  Discontinued     2 g 200 mL/hr over 30 Minutes Intravenous 30 min pre-op 07/29/15 0048 07/29/15 1540   07/28/15 2145  levofloxacin (LEVAQUIN) tablet 500 mg     500 mg Oral Daily 07/28/15 2141        Current Facility-Administered Medications  Medication Dose Route Frequency Provider Last Rate Last Dose  . 0.45 % sodium chloride infusion   Intravenous Continuous Eugenie Filler, MD 75 mL/hr at 07/31/15 [phone]    . acetaminophen (TYLENOL) tablet 650 mg  650 mg Oral Q6H PRN Ivor Costa, MD       Or  . acetaminophen (TYLENOL) suppository 650 mg  650 mg Rectal Q6H PRN Ivor Costa, MD      . feeding supplement (NEPRO CARB STEADY) liquid 237 mL  237 mL Oral TID BM Ivor Costa, MD 0 mL/hr at 07/28/15 2217 237 mL at 07/30/15 2000  . HYDROcodone-acetaminophen (NORCO/VICODIN) 5-325 MG per tablet 1 tablet  1 tablet Oral Q6H PRN Irine Seal, MD      . levofloxacin [facility]) tablet 500 mg  500 mg Oral Daily Ivor Costa, MD   500 mg at 07/29/15 1004  . ondansetron (ZOFRAN) tablet 4 mg  4 mg Oral Q6H PRN Ivor Costa, MD       Or  . ondansetron [facility]) injection 4 mg  4 mg Intravenous Q6H PRN Ivor Costa, MD      . sodium chloride flush (NS) 0.9 % injection 3 mL  3 mL Intravenous Q12H Ivor Costa, MD   3 mL at 07/29/15 1949  . sodium chloride irrigation 0.9 % 3,000 mL  3,000 mL Bladder Irrigation Q24H  Irine Seal, MD   3,000 mL at 07/30/15 1455  . tamsulosin (FLOMAX) capsule 0.4 mg  0.4 mg Oral Daily Ivor Costa, MD   0.4 mg at 07/29/15 1004     Objective: Vital signs in last 24 hours: Temp:  [97.4 F (36.3 C)-99.1 F (37.3 C)] 98.2 F (36.8 C) (04/09 0622) Pulse Rate:  [69-87] 69 (04/09 0622) Resp:  [11-18] 18 (04/09 0622) BP: (99-136)/(48-85) 100/48 mmHg (04/09 0622) SpO2:  [100 %] 100 % (04/09 0622)  Intake/Output from previous day: 04/08 0701 - 04/09 0700 In: 4990 [I.V.:2690] Out: 9950 [Urine:9750; Blood:200] Intake/Output this shift:     Physical Exam  Constitutional: He is well-developed, well-nourished, and in no distress.  Genitourinary:  Urine is clear with CBI off.    Lab Results:   Recent Labs  07/30/15 0544 07/31/15 0542  WBC 6.0 7.8  HGB 9.1* 9.0*  HCT 27.4* 26.8*  PLT 270 286   BMET  Recent Labs  07/30/15 0544 07/31/15 0542  NA 140 144  K 3.8 4.3  CL 111 114*  CO2 21* 24  GLUCOSE 95  102*  BUN 42* 38*  CREATININE 1.78* 1.52*  CALCIUM 8.2* 7.9*   PT/INR  Recent Labs  07/28/15 2127  LABPROT 15.9*  INR 1.31   ABG No results for input(s): PHART, HCO3 in the last 72 hours.  Invalid input(s): PCO2, PO2  Studies/Results: No results found.   Assessment and Plan: He is doing well post TURP and biopsy.  Path pending.   D/C CBI and O2. Heplock IV. Voiding trial in AM.        LOS: 3 days    Malka So 07/31/2015 [id number]

## 2015-08-01 ENCOUNTER — Encounter (HOSPITAL_COMMUNITY): Payer: Self-pay | Admitting: Urology

## 2015-08-01 DIAGNOSIS — N320 Bladder-neck obstruction: Secondary | ICD-10-CM | POA: Diagnosis present

## 2015-08-01 LAB — BASIC METABOLIC PANEL
Anion gap: 7 (ref 5–15)
BUN: 41 mg/dL — AB (ref 6–20)
CHLORIDE: 114 mmol/L — AB (ref 101–111)
CO2: 21 mmol/L — AB (ref 22–32)
CREATININE: 1.57 mg/dL — AB (ref 0.61–1.24)
Calcium: 8.1 mg/dL — ABNORMAL LOW (ref 8.9–10.3)
GFR calc Af Amer: 49 mL/min — ABNORMAL LOW (ref >60)
GFR calc non Af Amer: 42 mL/min — ABNORMAL LOW (ref >60)
Glucose, Bld: 96 mg/dL (ref 65–99)
POTASSIUM: 4.3 mmol/L (ref 3.5–5.1)
Sodium: 142 mmol/L (ref 135–145)

## 2015-08-01 LAB — CBC
HEMATOCRIT: 26.5 % — AB (ref 39.0–52.0)
HEMOGLOBIN: 8.9 g/dL — AB (ref 13.0–17.0)
MCH: 29.2 pg (ref 26.0–34.0)
MCHC: 33.6 g/dL (ref 30.0–36.0)
MCV: 86.9 fL (ref 78.0–100.0)
Platelets: 263 10*3/uL (ref 150–400)
RBC: 3.05 MIL/uL — AB (ref 4.22–5.81)
RDW: 15.6 % — ABNORMAL HIGH (ref 11.5–15.5)
WBC: 8.2 10*3/uL (ref 4.0–10.5)

## 2015-08-01 LAB — TYPE AND SCREEN
ABO/RH(D): O POS
ANTIBODY SCREEN: NEGATIVE
UNIT DIVISION: 0
Unit division: 0

## 2015-08-01 LAB — GLUCOSE, CAPILLARY: Glucose-Capillary: 96 mg/dL (ref 65–99)

## 2015-08-01 NOTE — Progress Notes (Signed)
This shift foley removed, per MD order, pt tolerated  Well, oncoming nurse  Aware for follow up, urinal at the bedside.

## 2015-08-01 NOTE — Progress Notes (Signed)
Patient ID: Joel [name], male   DOB: March 16, 1941, 75 y.o.   MRN: [id number] 2 Days Post-Op  Subjective: Joel [name] is doing well today. His urine is clear.   He has no complaints.   His renal is stable with a Cr of 1.57 and his potassium is normal.  He remains anemic.   His foley was not removed at 5 am as ordered so I don't know how he will do voiding yet.  ROS:  Review of Systems  Constitutional: Negative for fever and chills.    Anti-infectives: Anti-infectives    Start     Dose/Rate Route Frequency Ordered Stop   08/01/15 1000  levofloxacin (LEVAQUIN) tablet 250 mg     250 mg Oral Daily 07/31/15 1022     07/30/15 0600  ceFAZolin (ANCEF) IVPB 2g/100 mL premix  Status:  Discontinued     2 g 200 mL/hr over 30 Minutes Intravenous 30 min pre-op 07/29/15 1520 07/30/15 1027   07/29/15 0048  ceFAZolin (ANCEF) IVPB 2g/100 mL premix  Status:  Discontinued     2 g 200 mL/hr over 30 Minutes Intravenous 30 min pre-op 07/29/15 0048 07/29/15 1540   07/28/15 2145  levofloxacin (LEVAQUIN) tablet 500 mg  Status:  Discontinued     500 mg Oral Daily 07/28/15 2141 07/31/15 1022      Current Facility-Administered Medications  Medication Dose Route Frequency Provider Last Rate Last Dose  . 0.45 % sodium chloride infusion   Intravenous Continuous Eugenie Filler, MD 100 mL/hr at 07/31/15 1836    . acetaminophen (TYLENOL) tablet 650 mg  650 mg Oral Q6H PRN Ivor Costa, MD       Or  . acetaminophen (TYLENOL) suppository 650 mg  650 mg Rectal Q6H PRN Ivor Costa, MD      . feeding supplement (NEPRO CARB STEADY) liquid 237 mL  237 mL Oral TID BM Ivor Costa, MD 0 mL/hr at 07/28/15 2217 237 mL at 07/31/15 2121  . HYDROcodone-acetaminophen (NORCO/VICODIN) 5-325 MG per tablet 1 tablet  1 tablet Oral Q6H PRN Irine Seal, MD      . levofloxacin [facility]) tablet 250 mg  250 mg Oral Daily Eugenie Filler, MD      . ondansetron [facility]) tablet 4 mg  4 mg Oral Q6H PRN Ivor Costa, MD       Or  . ondansetron [facility])  injection 4 mg  4 mg Intravenous Q6H PRN Ivor Costa, MD      . sodium chloride flush (NS) 0.9 % injection 3 mL  3 mL Intravenous Q12H Ivor Costa, MD   3 mL at 07/31/15 1025  . sodium chloride irrigation 0.9 % 3,000 mL  3,000 mL Bladder Irrigation Q24H Irine Seal, MD   3,000 mL at 07/30/15 1455  . tamsulosin (FLOMAX) capsule 0.4 mg  0.4 mg Oral Daily Ivor Costa, MD   0.4 mg at 07/31/15 1020     Objective: Vital signs in last 24 hours: Temp:  [98.4 F (36.9 C)-99.1 F (37.3 C)] 98.5 F (36.9 C) (04/09 2032) Pulse Rate:  [82-86] 85 (04/09 2032) Resp:  [16-20] 20 (04/09 2032) BP: (98-107)/(59-66) 98/63 mmHg (04/09 2032) SpO2:  [99 %-100 %] 99 % (04/09 2032)  Intake/Output from previous day: 04/09 0701 - 04/10 0700 In: 2742.9 [P.O.:720; I.V.:2022.9] Out: 2950 [Urine:2950] Intake/Output this shift:     Physical Exam  Constitutional: He is well-developed, well-nourished, and in no distress.  Vitals reviewed.   Lab Results:   Recent Labs  07/31/15  0542 08/01/15 0519  WBC 7.8 8.2  HGB 9.0* 8.9*  HCT 26.8* 26.5*  PLT 286 263   BMET  Recent Labs  07/31/15 0542 08/01/15 0519  NA 144 142  K 4.3 4.3  CL 114* 114*  CO2 24 21*  GLUCOSE 102* 96  BUN 38* 41*  CREATININE 1.52* 1.57*  CALCIUM 7.9* 8.1*   PT/INR No results for input(s): LABPROT, INR in the last 72 hours. ABG No results for input(s): PHART, HCO3 in the last 72 hours.  Invalid input(s): PCO2, PO2  Studies/Results: No results found.   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   Voiding trial this morning and discharge is ok if successful.  Elevated PSA.   Path is pending.       LOS: 4 days    Malka So 08/01/2015 [id number]

## 2015-08-01 NOTE — Discharge Instructions (Signed)
Transurethral Resection of the Prostate, Care After °Refer to this sheet in the next few weeks. These instructions provide you with information on caring for yourself after your procedure. Your caregiver also may give you specific instructions. Your treatment has been planned according to current medical practices, but complications sometimes occur. Call your caregiver if you have any problems or questions after your procedure. °HOME CARE INSTRUCTIONS  °Recovery can take 4-6 weeks. Avoid alcohol, caffeinated drinks, and spicy foods for 2 weeks after your procedure. Drink enough fluids to keep your urine clear or pale yellow. Urinate as soon as you feel the urge to do so. Do not try to hold your urine for long periods of time. °During recovery you may experience pain caused by bladder spasms, which result in a very intense urge to urinate. Take all medicines as directed by your caregiver, including medicines for pain. Try to limit the amount of pain medicines you take because it can cause constipation. If you do become constipated, do not strain to move your bowels. Straining can increase bleeding. Constipation can be minimized by increasing the amount fluids and fiber in your diet. Your caregiver also may prescribe a stool softener. °Do not lift heavy objects (more than 5 lb [2.25 kg]) or perform exercises that cause you to strain for at least 1 month after your procedure. When sitting, you may want to sit in a soft chair or use a cushion. For the first 10 days after your procedure, avoid the following activities: °· Running. °· Strenuous work. °· Long walks. °· Riding in a car for extended periods. °· Sex. °SEEK MEDICAL CARE IF: °· You have difficulty urinating. °· You have blood in your urine that does not go away after you rest or increase your fluid intake. °· You have swelling in your penis or scrotum. °SEEK IMMEDIATE MEDICAL CARE IF:  °· You are suddenly unable to urinate. °· You notice blood clots in your  urine. °· You have chills. °· You have a fever. °· You have pain in your back or lower abdomen. °· You have pain or swelling in your legs. °MAKE SURE YOU:  °· Understand these instructions. °· Will watch your condition. °· Will get help right away if you are not doing well or get worse. °  °This information is not intended to replace advice given to you by your health care provider. Make sure you discuss any questions you have with your health care provider. °  °Document Released: 04/09/2005 Document Revised: 04/30/2014 Document Reviewed: 05/18/2011 °Elsevier Interactive Patient Education ©2016 [employer] ° °

## 2015-08-01 NOTE — Progress Notes (Signed)
Pt discharged from the unit. Discharge instructions were reviewed with the pt and family member. No questions or concerns from the pt at this time.  Jeanna Giuffre W Toree Edling, RN

## 2015-08-01 NOTE — Discharge Summary (Signed)
Physician Discharge Summary  Joel [name] [id number] DOB: 11/09/1940 DOA: 07/28/2015  PCP: [name], MD  Admit date: 07/28/2015 Discharge date: 08/01/2015  Time spent: 65 minutes  Recommendations for Outpatient Follow-up:  1. Follow-up with Dr.Wrenn urology as scheduled on 08/11/2015. On follow-up patient will need a basic metabolic profile done to follow-up on electrolytes and renal function. 2. Follow-up with Dr. Joelyn Oms of nephrology as previously scheduled.   Discharge Diagnoses:  Principal Problem:   Hyperkalemia Active Problems:   Bladder outlet obstruction   Normocytic anemia   Protein-calorie malnutrition, severe (HCC)   Elevated prostate specific antigen (PSA)   CKD (chronic kidney disease), stage IV (HCC)   Urinary retention   Anemia   Elevated serum creatinine   Discharge Condition: Stable and improved  Diet recommendation: Regular  Filed Weights   07/29/15 0050  Weight: 59.5 kg (131 lb 2.8 oz)    History of present illness:  Per Dr Monna Fam is a 75 y.o. male with PMH of urinary retention, bilateral hydronephrosis, nodular prostate without lower urinary tract symptoms, elevated prostate specific antigen,CKD-IV, anemia, cerebral aneurysm, who presented with hyperkalemia.  Patient was recently hospitalized from 3/16-3/22 due to acute renal injury. He was found to have bilateral hydronephrosis and elevated PSA. Pt stated that he was scheduled by urologist, Dr Roni Bread for biopsy On 07/29/2015. He had lab done on the day of admission, and found to have hyperkalemia 6.4 and therefore was sent to ED for further evaluation and treatment. Patient denied any symptoms. No fever, chills, chest pain, shortness of breath, abdominal pain, nausea, vomiting, diarrhea. He has Foley catheter, but no symptoms of UTI. Pt stated that he was instructed by Dr. Jeffie Pollock to take 500 mg of Levaquin at day before, at the day of and the day after the biopsy.   In ED, patient was  found to have potassium 6.1 without EKG change, creatinine 1.8 which was 2.88 on 07/13/15, WBC 6.2, temperature normal, no tachycardia. Pt is admitted to inpatient for further evaluation and treatment.  EKG: Independently reviewed. QTc 407, no T-wave peaking.   Hospital Course:  #1 hyperkalemia Likely secondary to acute on chronic kidney disease. Patient received a dose of Kayexalate in the emergency room. EKG with no peaked T waves. Patient remained asymptomatic throughout the hospitalization. Repeat lab work done showed patient still had an elevated potassium of 5.6 on 07/29/2015. Patient was given another dose of Kayexalate with resolutions of his hyperkalemia. Patient will follow-up with urology and nephrology as outpatient.   #2 bladder outlet obstruction /prostate enlargement with elevated PSA/urinary retention Patient status post TURP with biopsies done per urology 07/30/2015. Pathology pending. Patient was placed on oral Levaquin in the perioperative period as well as continued on Flomax. Post procedure patient underwent a voiding trial on day of discharge and had good urinary output. Patient is to follow-up with urology as previously scheduled on 08/11/2015.   #3 anemia Likely an anemia of chronic disease likely secondary to probable chronic kidney disease. Patient with no overt bleeding. Anemia panel with iron level of 29 TIBC of 234 with a ferritin level of 449 and a folate level of 22.3. B-12 levels at 279. S/P 2 units of packed red blood cells. Hemoglobin stable at 9.0.   #4 acute on chronic kidney disease Likely secondary to obstructive uropathy/bladder outlet obstruction secondary to prostate enlargement. Foley catheter placed. Unknown baseline for patient's chronic kidney disease however patient recently discharged on 07/13/2015 at which point in time his creatinine  was 2.88 from 8.06 on 07/08/2015. Patient was placed on IV hydration and followed. Patient's renal function trended down  during the hospitalization such that by day of discharge his creatinine was down to 1.57. Patient's creatinine seemed to her plateaued around 1.5-1.78. Foley catheter was removed prior to discharge and patient had good urinary output. Patient is to follow-up with nephrology as previously schedule as well as with urology.  #5 severe protein calorie malnuitrition Nutritional supplementation.   Procedures:  2 units packed red blood cells 07/29/2015  TURP per Dr. Jeffie Pollock 07/30/2015    Consultations:  Urology: Dr Jeffie Pollock 07/29/2015  Discharge Exam: Filed Vitals:   07/31/15 2032 08/01/15 0438  BP: 98/63 116/74  Pulse: 85 75  Temp: 98.5 F (36.9 C) 98.4 F (36.9 C)  Resp: 20 16    General: NAD Cardiovascular: RRR Respiratory: CTAB  Discharge Instructions   Discharge Instructions    Diet general    Complete by:  As directed      Discharge instructions    Complete by:  As directed   Follow up with Dr Jeffie Pollock as scheduled 08/11/2015 Follow up with Dr Joelyn Oms as scheduled.     Increase activity slowly    Complete by:  As directed           Current Discharge Medication List    CONTINUE these medications which have NOT CHANGED   Details  Nutritional Supplements (FEEDING SUPPLEMENT, NEPRO CARB STEADY,) LIQD Take 237 mLs by mouth 3 (three) times daily between meals. Qty: 90 Can, Refills: 0    tamsulosin (FLOMAX) 0.4 MG CAPS capsule Take 1 capsule (0.4 mg total) by mouth daily. Qty: 30 capsule, Refills: 0       No Known Allergies Follow-up Information    Follow up with Malka So, MD On 08/11/2015.   Specialty:  Urology   Why:  3:45pm   Contact information:   [place] [phone]       Follow up with [name], MD.   Specialty:  Nephrology   Why:  f/u as scheduled.   Contact information:   [place] [phone]        The results of significant diagnostics from this hospitalization (including  imaging, microbiology, ancillary and laboratory) are listed below for reference.    Significant Diagnostic Studies: Dg Chest 2 View  07/07/2015  CLINICAL DATA:  Acute onset of abnormal blood tests. Initial encounter. EXAM: CHEST  2 VIEW COMPARISON:  None. FINDINGS: The lungs are well-aerated and clear. There is no evidence of focal opacification, pleural effusion or pneumothorax. The heart is normal in size; the mediastinal contour is within normal limits. No acute osseous abnormalities are seen. IMPRESSION: No acute cardiopulmonary process seen. Electronically Signed   By: Garald Balding M.D.   On: 07/07/2015 23:01   US Renal  07/08/2015  CLINICAL DATA:  Acute kidney injury EXAM: RENAL / URINARY TRACT ULTRASOUND COMPLETE COMPARISON:  None. FINDINGS: Right Kidney: Length: 11 cm. Borderline echogenic. High-grade hydronephrosis and upper hydroureter. No evidence of mass lesion. Left Kidney: Length: 12 cm. Borderline echogenic. High-grade hydronephrosis and hydroureter. No evidence of mass lesion. Bladder: Overdistended bladder with 1100 cc volume. The patient could only urinate 150 cc. Enlarged prostate with nodular component projecting into the bladder. Floating debris in the bladder. These results were called by telephone at the time of interpretation on 07/08/2015 at 1:47 am to Dr. Ivor Costa , who verbally acknowledged these results. IMPRESSION:  1. Prostatomegaly with bladder outlet obstruction causing bilateral hydronephrosis. 2. Bladder debris, correlate with urinalysis. Electronically Signed   By: Monte Fantasia M.D.   On: 07/08/2015 01:47    Microbiology: No results found for this or any previous visit (from the past 240 hour(s)).   Labs: Basic Metabolic Panel:  Recent Labs Lab 07/29/15 0938 07/29/15 1922 07/30/15 0544 07/31/15 0542 08/01/15 0519  NA 141 144 140 144 142  K 5.6* 4.3 3.8 4.3 4.3  CL 113* 114* 111 114* 114*  CO2 23 22 21* 24 21*  GLUCOSE 89 136* 95 102* 96  BUN 43* 43*  42* 38* 41*  CREATININE 1.72* 1.69* 1.78* 1.52* 1.57*  CALCIUM 8.8* 8.5* 8.2* 7.9* 8.1*   Liver Function Tests:  Recent Labs Lab 07/29/15 0512  AST 22  ALT 29  ALKPHOS 53  BILITOT <0.1*  PROT 6.3*  ALBUMIN 3.2*   No results for input(s): LIPASE, AMYLASE in the last 168 hours. No results for input(s): AMMONIA in the last 168 hours. CBC:  Recent Labs Lab 07/28/15 1120 07/29/15 0512 07/29/15 1922 07/30/15 0544 07/31/15 0542 08/01/15 0519  WBC 6.2 5.6  --  6.0 7.8 8.2  HGB 7.9* 7.6* 10.0* 9.1* 9.0* 8.9*  HCT 24.3* 22.5* 29.9* 27.4* 26.8* 26.5*  MCV 86.5 85.2  --  86.2 84.8 86.9  PLT 318 279  --  270 286 263   Cardiac Enzymes: No results for input(s): CKTOTAL, CKMB, CKMBINDEX, TROPONINI in the last 168 hours. BNP: BNP (last 3 results)  Recent Labs  07/08/15 0106  BNP 104.0*    ProBNP (last 3 results) No results for input(s): PROBNP in the last 8760 hours.  CBG:  Recent Labs Lab 07/29/15 0730 07/31/15 0724 08/01/15 0729  GLUCAP 78 91 96       Signed:  Ritamarie Arkin MD.  [facility] 08/01/2015, 2:34 PM

## 2015-08-01 NOTE — Care Management Important Message (Signed)
Important Message  Patient Details  Name: Forrest Millican MRN: [id number] Date of Birth: Aug 22, 1940   Medicare Important Message Given:  Yes    Camillo Flaming 08/01/2015, 11:07 AMImportant Message  Patient Details  Name: Dior Palomarez MRN: [id number] Date of Birth: 1941/01/09   Medicare Important Message Given:  Yes    Camillo Flaming 08/01/2015, 11:07 AM

## 2015-08-01 NOTE — Op Note (Signed)
[name], [name] NO.:  [id number]  MEDICAL RECORD NO.:  [id number]  LOCATION:                                FACILITY:  [facility]  PHYSICIAN:  Marshall Cork. Jeffie Pollock, M.D.    DATE OF BIRTH:  [birth year]  DATE OF PROCEDURE:  07/30/2015 DATE OF DISCHARGE:                              OPERATIVE REPORT   Patient of Dr. Irine Seal.  PROCEDURE: 1. Transrectal prostate biopsy. 2. Transurethral resection of prostate.  PREOPERATIVE DIAGNOSES:  Benign prostatic hyperplasia with bladder outlet obstruction, urinary retention, and markedly elevated PSA.  POSTOPERATIVE DIAGNOSES:  Benign prostatic hyperplasia with bladder outlet obstruction, urinary retention, and markedly elevated PSA.  SURGEON:  Marshall Cork. Jeffie Pollock, M.D.  ANESTHESIA:  General.  SPECIMEN:  Six-core prostate biopsy and prostate chips.  DRAINS:  A 22-French, 3 way Foley catheter.  BLOOD LOSS:  Approximately 200 mL.  COMPLICATIONS:  None.  INDICATIONS:  [name] is a 75 year old African American male who was initially seen following an episode of urinary retention, and obstructive uropathy with an elevated creatinine of approximately 4.  He has undergone catheter decompression and was placed on medical therapy, but has failed voiding trials.  His creatinine had declined to 1.95, but he was noted to be hyperkalemic on preop labs on Thursday.  He was originally scheduled for TUR on Friday, but this was postponed, and he was admitted for medical management and was found also to be anemic, and was transfused.  He is now to undergo a TURP today.  He also has an elevated PSA it was 95 on his initial admission and dropped to 65 when I saw him back in the office, but that was associated with a testosterone of only 40.  His prostate was indurated on exam and on ultrasound was approximately 44 mL without significant abnormalities, but it was felt that a prostate biopsy was indicated in addition to the  TURP.  FINDINGS OF PROCEDURE:  He had been on oral Levaquin and was given Ancef preoperatively today.  He was taken to the operating room where general anesthetic was induced.  He was fitted with PAS hose and placed in lithotomy position.  Initially, he underwent a digitally guided sextant prostate biopsy using an 18-gauge disposable biopsy gun.  Cores were taken from the right base, right mid, and right apex of the prostate along with the left base, left mid, and left apex of the prostate.  And on exam today, the prostate felt more benign in consistency than on the prior rectal exam.  Once the biopsies were performed, the patient was prepped with Betadine solution and draped in usual sterile fashion.  The 26-French resectoscope was placed with the aid of the visual obturator.  This was fitted with Beatrix Fetters handle with a 30-degree lens.  A bipolar loop and saline was used as the irrigant.  Inspection of the bladder demonstrated approximately 4 cm prostatic urethra with a large obstructing middle lobe and some lateral lobe hyperplasia.  He had no urethral strictures and the sphincter was intact.  Examination of bladder revealed severe trabeculation with cellules and small diverticula.  There was catheter irritation on the posterior wall.  Ureteral orifices were not well seen because of the large middle lobe.  Resection was initiated at the bladder neck resecting the middle lobe down to the bladder neck fibers.  The floor of the prostate was then resected out to alongside of the verumontanum.  The left lobe of the prostate was resected from bladder neck to apex out to capsular fibers.  This was followed by resection of the right lobe.  At this point, the bladder was evacuated free of chips.  Some additional apical and anterior tissue was resected.  These chips were removed. Hemostasis was achieved.  Final inspection revealed no retained chips, no active bleeding, widely patent  prostatic urethra, and intact external sphincter.  The scope was removed.  Pressure on the bladder produced a good stream.  A 22-French 3-way Foley catheter was inserted with the aid of a catheter guide.  The balloon was filled with 20 mL of sterile fluid.  The catheter was removed.  The catheter was irrigated with clear return, and placed to continuous irrigation and straight drainage.  His prostate chips were collected for permanent pathology along with the prostate biopsies.  He was taken down from lithotomy position.  His anesthetic was reversed. He was moved to recovery room in stable condition.  There were no complications.     Marshall Cork. Jeffie Pollock, M.D.     JJW/MEDQ  D:  07/30/2015  T:  07/30/2015  Job:  [id number]

## 2016-01-10 ENCOUNTER — Other Ambulatory Visit: Payer: Self-pay | Admitting: Urology

## 2016-01-10 DIAGNOSIS — C61 Malignant neoplasm of prostate: Secondary | ICD-10-CM

## 2016-01-19 ENCOUNTER — Encounter (HOSPITAL_COMMUNITY): Payer: Medicare HMO

## 2016-01-25 ENCOUNTER — Encounter (HOSPITAL_COMMUNITY): Payer: Medicare HMO

## 2016-01-31 ENCOUNTER — Encounter (HOSPITAL_COMMUNITY)
Admission: RE | Admit: 2016-01-31 | Discharge: 2016-01-31 | Disposition: A | Discharge status: Home / Self Care | Payer: Medicare HMO | Source: Ambulatory Visit | Attending: Urology | Admitting: Urology

## 2016-01-31 DIAGNOSIS — C61 Malignant neoplasm of prostate: Secondary | ICD-10-CM

## 2017-02-25 ENCOUNTER — Ambulatory Visit: Payer: Medicare HMO | Admitting: Physician Assistant

## 2017-02-25 ENCOUNTER — Encounter: Payer: Self-pay | Admitting: Physician Assistant

## 2017-02-25 VITALS — BP 118/62 | HR 83 | Temp 98.8°F | Resp 16 | Ht 66.85 in | Wt 149.8 lb

## 2017-02-25 DIAGNOSIS — R590 Localized enlarged lymph nodes: Secondary | ICD-10-CM

## 2017-02-25 DIAGNOSIS — R599 Enlarged lymph nodes, unspecified: Secondary | ICD-10-CM

## 2017-02-25 DIAGNOSIS — R221 Localized swelling, mass and lump, neck: Secondary | ICD-10-CM

## 2017-02-25 DIAGNOSIS — Z8546 Personal history of malignant neoplasm of prostate: Secondary | ICD-10-CM | POA: Diagnosis not present

## 2017-02-25 NOTE — Patient Instructions (Addendum)
  Your physical exam findings are concerning as you do have a large mass in your neck and multiple enlarged lymph nodes.  According to your PET scan back in October of last year, you had some large lymph nodes at that time as well.  I contacted Dr. Ralene Muskrat office and they were wanting to follow-up with you on the PET scan findings.  After talking with them today, they would still love to see you to discuss these findings.  I have told him that I have obtained basic lab work.  I have also placed an order for a CT of your neck and chest and the imaging center should contact you within the next week to have these images done.  Please go and have the CT done and make sure you call Dr. Ralene Muskrat office to schedule a follow-up appointment.  I will contact you once the blood work and image results are back.  In the meantime, if you develop any difficulty swallowing, difficulty breathing, pain, or other concerning symptoms please seek care immediately.   IF you received an x-ray today, you will receive an invoice from [facility]. Please contact [facility] at [phone] with questions or concerns regarding your invoice.   IF you received labwork today, you will receive an invoice from [facility]. Please contact [facility] at [phone] with questions or concerns regarding your invoice.   Our billing staff will not be able to assist you with questions regarding bills from these companies.  You will be contacted with the lab results as soon as they are available. The fastest way to get your results is to activate your My Chart account. Instructions are located on the last page of this paperwork. If you have not heard from [place] regarding the results in 2 weeks, please contact this office.

## 2017-02-25 NOTE — Progress Notes (Addendum)
Joel [name]  MRN: [id number] DOB: March 08, 1941  Subjective:  Joel [name] is a 76 y.o. male with prostate cancer, hydronephrosis,CKD-IV, anemia, cerebral aneurysm, in office today for a chief complaint of neck mass times 2 months.  He thought it would go away but it has persisted.  It used to be tender but now he has no tenderness with palpation.  He denies any redness, warmth, purulent drainage, difficulty swallowing, difficulty breathing, SOB, and voice change.  He is a former smoker, quit about 30 years ago.  He has not tried anything for relief.  He comes in today because his family was worried about him made him come to the doctor.  Of note patient was followed for his prostate issues by Dr. Jeffie Pollock at [facility].  Per patient, he was told that there were no issues with his prostate and so he has not followed up with them since his PET scan in 01/2016.  Last follow up with [facility] was in 08/2016.  His creatinine has basically normalized at that point.  Has no other questions or complaints.  Review of Systems  Constitutional: Negative for activity change, chills, diaphoresis, fatigue, fever and unexpected weight change.  Cardiovascular: Positive for leg swelling. Negative for chest pain and palpitations.  Gastrointestinal: Negative for abdominal pain, diarrhea, nausea, rectal pain and vomiting.  Endocrine: Negative for cold intolerance and heat intolerance.  Musculoskeletal: Negative for neck pain.  Neurological: Negative for dizziness and light-headedness.    Patient Active Problem List   Diagnosis Date Noted  . Bladder outlet obstruction 08/01/2015  . Anemia 07/29/2015  . Elevated serum creatinine   . Elevated prostate specific antigen (PSA) 07/28/2015  . CKD (chronic kidney disease), stage IV ([place]) 07/28/2015  . Urinary retention 07/28/2015  . AKI (acute kidney injury) ([place]) 07/08/2015  . Normocytic anemia 07/08/2015  . Hyperkalemia 07/08/2015  .  Elevated blood pressure 07/08/2015  . Renal failure 07/08/2015  . Diarrhea 07/08/2015  . Protein-calorie malnutrition, severe ([place]) 07/08/2015  . Bilateral leg edema 07/08/2015    Current Outpatient Medications on File Prior to Visit  Medication Sig Dispense Refill  . Nutritional Supplements (FEEDING SUPPLEMENT, NEPRO CARB STEADY,) LIQD Take 237 mLs by mouth 3 (three) times daily between meals. (Patient not taking: Reported on 02/25/2017) 90 Can 0  . tamsulosin (FLOMAX) 0.4 MG CAPS capsule Take 1 capsule (0.4 mg total) by mouth daily. (Patient not taking: Reported on 02/25/2017) 30 capsule 0   No current facility-administered medications on file prior to visit.     No Known Allergies   Objective:  BP 118/62 (BP Location: Left Arm, Patient Position: Sitting, Cuff Size: Normal)   Pulse 83   Temp 98.8 F (37.1 C) (Oral)   Resp 16   Ht 5' 6.85" (1.698 m)   Wt 149 lb 12.8 oz (67.9 kg)   SpO2 98%   BMI 23.57 kg/m   Physical Exam  Constitutional: He is oriented to person, place, and time. He appears unhealthy.  HENT:  Head: Normocephalic and atraumatic.  Mouth/Throat: Oropharynx is clear and moist and mucous membranes are normal. Abnormal dentition. Dental caries present.  Eyes: Conjunctivae are normal.  Neck: No spinous process tenderness and no muscular tenderness present. Decreased range of motion present. No erythema present.  Cardiovascular: Normal rate and regular rhythm.  Murmur heard.  Systolic murmur is present. Pulmonary/Chest: Effort normal and breath sounds normal. No respiratory distress. He has no wheezes. He has no rhonchi. He has no rales.  Musculoskeletal:  Right lower leg: He exhibits edema.       Left lower leg: He exhibits edema.  Lymphadenopathy:       Head (right side): Submandibular adenopathy present.    He has cervical adenopathy.    He has axillary adenopathy.       Right: Inguinal and supraclavicular adenopathy present.       Left: Inguinal  adenopathy present.  Large 10cm by 8cm hard fixed mass on right side of neck. Difficult to distinguish if this is multiple or one large lymph node.   Diffuse lymphadenopathy palpated on exam.  No tenderness noted.     Neurological: He is alert and oriented to person, place, and time. Gait normal.  Skin: Skin is warm and dry.  Psychiatric: Affect normal.  Vitals reviewed.     EXAM: NUCLEAR MEDICINE PET SKULL BASE TO THIGH 01/31/16 IMPRESSION: 1. Amino acid radiotracer accumulation within periaortic retroperitoneal lymph nodes consistent with prostate cancer metastasis. 2. Radiotracer accumulation within LEFT and RIGHT internal and external iliac lymph nodes as well as bilateral inguinal lymph nodes consistent with local prostate cancer metastasis 3. Focal activity within the LEFT prostate apex concerning for local recurrence. 4. Cluster of rounded nodules within the LEFT lower lobe WITHOUT significant radiotracer accumulation are favored not to related to prostate cancer metastasis. Cannot exclude other malignancies. Recommend correlation with smoking history and at minimum recommend follow-up CT of the chest with contrast in 3 months. 5. Radiotracer accumulation within normal size RIGHT axial lymph nodes, RIGHT supraclavicular lymph nodes and RIGHT neck lymph nodes. Uptake favored benign and may relate to radiotracer infiltration in the RIGHT antecubital fossa.   I contacted [facility] directly and spoke with the nurse.  Per his chart review, the nurse states his last PSA was 173.  He was diagnosed with Gleason 6 single core prostate cancer.  The nurse states that Dr. Jeffie Pollock wanted to schedule a consult with patient on 02/29/16 to discuss concerning PET scan results from 01/2016.  However, the patient canceled this consult appointment and never followed up.  Assessment and Plan :  This case was precepted with Dr.Smith.  1. Localized swelling, mass or lump of neck - CT  Soft Tissue Neck W Contrast; Future - CT Chest W Contrast; Future 2. Localized enlarged lymph nodes - CT Chest W Contrast; Future 3. Enlarged lymph node - CMP14+EGFR - CBC with Differential/Platelet - HIV 1 RNA quant-no reflex-bld 4. History of prostate cancer - PSA  History and physical exam findings highly suspicious for lymphoma vs metastatic disease.  The largest neck mass of concern is not currently blocking patient's airway but patient has been educated that if it starts to do so and he develops any difficulty breathing, shortness of breath, trouble swallowing seek care immediately.  Labs and CT imaging pending.  There is a clear disconnect in what the patient says he thought was going on and what [facility] was planning to do.  It is uncertain if this is due to patient denial or miscommunication.  However, pt is standing by his statement that he was not aware of a diagnosis of prostate cancer or any abnormal findings on PET scan.  After talking with the nurse at [facility], my plan is to obtain basic lab work and a CT of his neck and chest with contrast.  The nurse notes she will notify Dr. Jeffie Pollock of patient's case and that they are more than willing to have patient return to office for further evaluation.  I have encouraged the patient to call [facility] and schedule a follow-up appointment as soon as possible.  Patient understands and agrees to treatment plan.  Tenna Delaine PA-C  [facility] 02/26/2017 3:20 PM

## 2017-02-26 ENCOUNTER — Encounter: Payer: Self-pay | Admitting: Physician Assistant

## 2017-02-26 LAB — CBC WITH DIFFERENTIAL/PLATELET
Basophils Absolute: 0 10*3/uL (ref 0.0–0.2)
Basos: 0 %
EOS (ABSOLUTE): 0.1 10*3/uL (ref 0.0–0.4)
Eos: 1 %
Hematocrit: 22.2 % — ABNORMAL LOW (ref 37.5–51.0)
Hemoglobin: 7.5 g/dL — ABNORMAL LOW (ref 13.0–17.7)
Lymphocytes Absolute: 6.5 10*3/uL — ABNORMAL HIGH (ref 0.7–3.1)
Lymphs: 56 %
MCH: 28.7 pg (ref 26.6–33.0)
MCHC: 33.8 g/dL (ref 31.5–35.7)
MCV: 85 fL (ref 79–97)
MONOS ABS: 0.3 10*3/uL (ref 0.1–0.9)
Monocytes: 3 %
NEUTROS ABS: 3.2 10*3/uL (ref 1.4–7.0)
NRBC: 3 % — ABNORMAL HIGH (ref 0–0)
Neutrophils: 28 %
PLATELETS: 119 10*3/uL — AB (ref 150–379)
RBC: 2.61 x10E6/uL — AB (ref 4.14–5.80)
RDW: 18.9 % — ABNORMAL HIGH (ref 12.3–15.4)
WBC: 11.6 10*3/uL — AB (ref 3.4–10.8)

## 2017-02-26 LAB — CMP14+EGFR
A/G RATIO: 1 — AB (ref 1.2–2.2)
ALBUMIN: 3.7 g/dL (ref 3.5–4.8)
ALK PHOS: 108 IU/L (ref 39–117)
ALT: 12 IU/L (ref 0–44)
AST: 27 IU/L (ref 0–40)
BILIRUBIN TOTAL: 0.3 mg/dL (ref 0.0–1.2)
BUN / CREAT RATIO: 19 (ref 10–24)
BUN: 29 mg/dL — AB (ref 8–27)
CHLORIDE: 106 mmol/L (ref 96–106)
CO2: 20 mmol/L (ref 20–29)
Calcium: 9 mg/dL (ref 8.6–10.2)
Creatinine, Ser: 1.5 mg/dL — ABNORMAL HIGH (ref 0.76–1.27)
GFR calc Af Amer: 53 mL/min/{1.73_m2} — ABNORMAL LOW (ref >59)
GFR calc non Af Amer: 46 mL/min/{1.73_m2} — ABNORMAL LOW (ref >59)
GLOBULIN, TOTAL: 3.8 g/dL (ref 1.5–4.5)
GLUCOSE: 93 mg/dL (ref 65–99)
POTASSIUM: 4.5 mmol/L (ref 3.5–5.2)
SODIUM: 141 mmol/L (ref 134–144)
Total Protein: 7.5 g/dL (ref 6.0–8.5)

## 2017-02-26 LAB — PSA: Prostate Specific Ag, Serum: 390.7 ng/mL — ABNORMAL HIGH (ref 0.0–4.0)

## 2017-02-26 LAB — IMMATURE CELLS
Metamyelocytes: 1 % — ABNORMAL HIGH (ref 0–0)
OTHER, LINEAGE UNCERTAIN: 11 % — ABNORMAL HIGH (ref 0–0)

## 2017-02-26 LAB — HIV-1 RNA QUANT-NO REFLEX-BLD

## 2017-02-27 ENCOUNTER — Ambulatory Visit
Admission: RE | Admit: 2017-02-27 | Discharge: 2017-02-27 | Disposition: A | Discharge status: Home / Self Care | Payer: Medicare HMO | Source: Ambulatory Visit | Attending: Physician Assistant | Admitting: Physician Assistant

## 2017-02-27 ENCOUNTER — Telehealth: Payer: Self-pay | Admitting: Physician Assistant

## 2017-02-27 ENCOUNTER — Other Ambulatory Visit: Payer: Self-pay | Admitting: Physician Assistant

## 2017-02-27 DIAGNOSIS — R221 Localized swelling, mass and lump, neck: Secondary | ICD-10-CM

## 2017-02-27 DIAGNOSIS — R590 Localized enlarged lymph nodes: Secondary | ICD-10-CM

## 2017-02-27 DIAGNOSIS — D649 Anemia, unspecified: Secondary | ICD-10-CM

## 2017-02-27 MED ORDER — IOPAMIDOL (ISOVUE-300) INJECTION 61%: 125.0000 mL | Freq: Once | INTRAVENOUS | Status: DC | PRN

## 2017-02-27 MED ORDER — IOPAMIDOL (ISOVUE-300) INJECTION 61%
100.0000 mL | Freq: Once | INTRAVENOUS | Status: AC | PRN
  Administered 2017-02-27: 100 mL via INTRAVENOUS

## 2017-02-27 NOTE — Telephone Encounter (Signed)
Pt contacted again. He answered. Discussed most recent labs and CT neck findings. 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  PSA was 390.  CT of the neck showed very large lymph node mass in the right neck. The right jugular vein is encased by the tumor and is occluded. The mass is displacing the pharynx to the left.  I explained to the patient that I have discussed these findings with the oncologist on-call and we both agreed that these findings are concerning and he warrants further evaluation by the ED.  Recommend he go to [facility].  He is still asymptomatic. Denied any shortness of breath or difficulty breathing.  He notes he will ask his daughter once she is home from church if she will taken to the ED tonight.  He had no other questions or concerns.  Encouraged him to contact our answering service if he has any further questions.

## 2017-02-27 NOTE — Telephone Encounter (Signed)
This case was initially precepted with Dr. Tamala Julian who recommended I contact the oncologist on call..  I contacted the oncologist on call at the hospital   And discuss patient's case.  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  PSA was 390.  CT of the neck showed very large lymph node mass in the right neck. The right jugular vein is encased by the tumor and is occluded. The mass is displacing the pharynx to the left.  When patient was evaluated in office 2 days ago he was asymptomatic.  Denied any shortness of breath or difficulty breathing.  The oncologist on call and I still agree that the patient warrants further evaluation by the ED.  The oncologist recommended [facility].  I contacted patient twice with no answer.  I left him a voicemail explaining his concerning findings and that he should go immediately to [facility] for further evaluation.

## 2017-03-01 ENCOUNTER — Telehealth: Payer: Self-pay | Admitting: Physician Assistant

## 2017-03-01 NOTE — Telephone Encounter (Signed)
Spoke with Beverlee Nims at [facility] in [place]. We routed referral to them rather than [facility] but they were able to schedule pt for 03/12/17. She wanted to know if we meant to send to them or [facility]. I told her I would ask the pt what was best for him. I asked if he could have his initial with them and transfer to [facility] and they said they do not like to do that but could. Pt said he could go to either facility but would prefer to go to [facility] if he will have to follow up. I told the pt and Beverlee Nims we will keep this for now but also reroute to [facility] to see if they can do a sooner appt. I wanted to check with [place] to see if she thinks we can get pt in sooner with [facility] or stick with [facility] for initial. I will call Beverlee Nims and pt back once I know. Thanks!

## 2017-03-02 ENCOUNTER — Encounter (HOSPITAL_COMMUNITY): Payer: Self-pay | Admitting: Emergency Medicine

## 2017-03-02 ENCOUNTER — Emergency Department (HOSPITAL_COMMUNITY)
Admission: EM | Admit: 2017-03-02 | Discharge: 2017-03-02 | Disposition: A | Discharge status: Home / Self Care | Payer: Medicare HMO | Attending: Emergency Medicine | Admitting: Emergency Medicine

## 2017-03-02 DIAGNOSIS — Z87891 Personal history of nicotine dependence: Secondary | ICD-10-CM | POA: Insufficient documentation

## 2017-03-02 DIAGNOSIS — Z79899 Other long term (current) drug therapy: Secondary | ICD-10-CM | POA: Diagnosis not present

## 2017-03-02 DIAGNOSIS — N184 Chronic kidney disease, stage 4 (severe): Secondary | ICD-10-CM | POA: Diagnosis not present

## 2017-03-02 DIAGNOSIS — R221 Localized swelling, mass and lump, neck: Secondary | ICD-10-CM | POA: Insufficient documentation

## 2017-03-02 NOTE — ED Provider Notes (Signed)
[facility] DEPT Provider Note   CSN: [id number] Arrival date & time: 03/02/17  0957     History   Chief Complaint Chief Complaint  Patient presents with  . neck mass    HPI Joel [name] is a 76 y.o. male.  HPI Patient is a 76 year old male with no prior history of smoking who presents the emergency department with 2 months of worsening right sided neck swelling.  He was seen by his primary care team as an outpatient outpatient CT was performed 3 days ago demonstrating significant right-sided neck mass with extension from the right mandible down to the right supraclavicular region.  Patient reports no difficulty breathing or swallowing.  Denies weight loss.  He does report the use of oral tobacco products when he was a teenager.  None since then.  No other complaints at this time.  He finally sought care by his primary care team at the urging of his family members.   Past Medical History:  Diagnosis Date  . Anemia   . Aneurysm ([place])    in 1987  . Bilateral hydronephrosis   . Pneumonia    hx of   . Urinary retention     Patient Active Problem List   Diagnosis Date Noted  . Bladder outlet obstruction 08/01/2015  . Anemia 07/29/2015  . Elevated serum creatinine   . Elevated prostate specific antigen (PSA) 07/28/2015  . CKD (chronic kidney disease), stage IV ([place]) 07/28/2015  . Urinary retention 07/28/2015  . AKI (acute kidney injury) ([place]) 07/08/2015  . Normocytic anemia 07/08/2015  . Hyperkalemia 07/08/2015  . Elevated blood pressure 07/08/2015  . Renal failure 07/08/2015  . Diarrhea 07/08/2015  . Protein-calorie malnutrition, severe ([place]) 07/08/2015  . Bilateral leg edema 07/08/2015    History reviewed. No pertinent surgical history.     Home Medications    Prior to Admission medications   Medication Sig Start Date End Date Taking? Authorizing Provider  Nutritional Supplements (FEEDING SUPPLEMENT, NEPRO CARB STEADY,) LIQD  Take 237 mLs by mouth 3 (three) times daily between meals. Patient not taking: Reported on 02/25/2017 07/13/15   Jonetta Osgood, MD  tamsulosin (FLOMAX) 0.4 MG CAPS capsule Take 1 capsule (0.4 mg total) by mouth daily. Patient not taking: Reported on 02/25/2017 07/13/15   Jonetta Osgood, MD    Family History Family History  Problem Relation Age of Onset  . Dementia Mother   . Heart disease Father   . Lung cancer Brother   . Lupus Sister     Social History Social History   Tobacco Use  . Smoking status: Former Smoker    Last attempt to quit: 04/28/1985    Years since quitting: 31.8  . Smokeless tobacco: Former Systems developer    Types: Chew  . Tobacco comment: quit 30-40 yrs. aog  Substance Use Topics  . Alcohol use: No    Alcohol/week: 0.0 oz  . Drug use: No     Allergies   Patient has no known allergies.   Review of Systems Review of Systems  All other systems reviewed and are negative.    Physical Exam Updated Vital Signs BP 134/83 (BP Location: Right Arm)   Pulse 77   Temp 98.2 F (36.8 C) (Oral)   Resp 16   Ht 5\' 6"  (1.676 m)   Wt 67.6 kg (149 lb)   SpO2 92%   BMI 24.05 kg/m   Physical Exam  Constitutional: He is oriented to person, place, and time.  HENT:  Head: Normocephalic.  Edentulous.  Posterior pharynx normal.  Dense mass of his right upper intraoral region near his maxilla.  Eyes: EOM are normal.  Neck:  Large dense right sided neck mass without overlying skin changes.  Right supraclavicular nodes  Cardiovascular: Normal rate, regular rhythm and intact distal pulses.  Pulmonary/Chest: Effort normal and breath sounds normal. No respiratory distress.  Abdominal: He exhibits no distension. There is no tenderness.  Musculoskeletal: Normal range of motion.  Neurological: He is alert and oriented to person, place, and time.  Skin: Skin is warm and dry.  Nursing note and vitals reviewed.    ED Treatments / Results  Labs (all labs ordered are  listed, but only abnormal results are displayed) Labs Reviewed - No data to display  EKG  EKG Interpretation None       Radiology CLINICAL DATA:  Neck mass.  History prostate cancer  EXAM: CT NECK WITH CONTRAST  TECHNIQUE: Multidetector CT imaging of the neck was performed using the standard protocol following the bolus administration of intravenous contrast.  CONTRAST:  129mL ISOVUE-300 IOPAMIDOL (ISOVUE-300) INJECTION 61%  COMPARISON:  PET 01/31/2016  FINDINGS: Pharynx and larynx: Tongue and tonsils normal. Pharynx is displaced to the left due to enlarged right neck mass. No pharyngeal mass. Larynx intact although displaced to the left significantly.  Salivary glands: Parotid glands intact bilaterally. Right submandibular gland not well seen due to large lymph node mass in the right neck. Hypoplastic left submandibular gland.  Thyroid: 7 mm left thyroid nodule.  Thyroid overall normal in size  Lymph nodes: Enormous mass in the right neck. This measures approximately 5.8 x 8.7 x 15 cm. This mass extends from the mandible to the supraclavicular lymph nodes and posterior to the right clavicle into the right superior mediastinum. The mass shows mild heterogeneous enhancement and engulfs multiple vessels. The right jugular vein is occluded by the mass. On the left, there is a small 11 mm left level 2 lymph node which is indeterminate. Evaluation is difficult in this case due to lack of body fat  Vascular: Occluded right jugular vein due to lymph node mass in the right neck. Both carotid arteries are patent. Left jugular vein patent. Right innominate vein not well seen and possibly occluded due to tumor.  Limited intracranial: Ventricular enlargement likely due to atrophy. Edema in the right occipital lobe could be due to metastatic disease. No definite enhancing mass lesion. Further evaluation recommended.  Visualized orbits: Negative  Mastoids and  visualized paranasal sinuses: Mild mucosal edema in the paranasal sinuses  Skeleton: Poor dentition with numerous caries and periapical lucencies due to dental infection. No skeletal metastatic disease or fracture identified.  Upper chest: 2.5 cm sub solid density right upper lobe. Small patchy sub solid density in the superior segment left lower lobe.  Enlarged lymph node projecting into the superior mediastinum on the right, contiguous with a large lymph node mass in the neck.  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 Right jugular vein is encased by tumor and is occluded. The mass is displacing the pharynx to the left. 11 mm left level 2 lymph node in the neck is indeterminate.  The mass extends into the right supraclavicular region and into the right superior mediastinum. It is possible the right innominate vein is occluded. There is sub optimal vascular enhancement.  2.5 cm sub solid density right upper lobe. This could be due to tumor  or infection. Smaller sub solid density superior segment left lower lobe. CT chest recommended for further evaluation.  Edema in the right lower occipital lobe which could be due to metastatic disease versus chronic infarct. MRI brain without with contrast suggested for further evaluation.   Electronically Signed   By: Franchot Gallo M.D.   On: 02/27/2017 15:51  Procedures Procedures (including critical care time)  Medications Ordered in ED Medications - No data to display   Initial Impression / Assessment and Plan / ED Course  I have reviewed the triage vital signs and the nursing notes.  Pertinent labs & imaging results that were available during my care of the patient were reviewed by me and considered in my medical decision making (see chart for details).     Dense right sided neck mass for the past 2 months.  Eating and drinking normally.  Tolerating  secretions.  Airway patent.  No indication for emergent treatment at this time.  I discussed the case with on-call ear nose and throat surgeon Dr. Benjamine Mola who will follow up with the patient in the office.  Patient will need biopsy for further investigation and possible referral to a tertiary care [facility] given the size of his right-sided neck mass and the fact it encases multiple vessels.  Final Clinical Impressions(s) / ED Diagnoses   Final diagnoses:  Mass of right side of neck    ED Discharge Orders    None       Jola Schmidt, MD 03/02/17 [phone]

## 2017-03-02 NOTE — ED Triage Notes (Signed)
Per pt, states mass on right side of neck-was evaluated by his PCP and oncology consulted-advised to come to ED for further work up

## 2017-03-04 NOTE — Telephone Encounter (Signed)
Joel [name], if the first available appointment isn't until 03/12/17 with [facility], I would prefer he stay with them for the initial visit.  However if if [facility] can see him before then that would be great.  If not, he may just have to have the initial with [facility] and then follow up in [place] if they can transfer his information.  Please let me know.

## 2017-03-06 ENCOUNTER — Other Ambulatory Visit (INDEPENDENT_AMBULATORY_CARE_PROVIDER_SITE_OTHER): Payer: Self-pay | Admitting: Otolaryngology

## 2017-03-07 ENCOUNTER — Telehealth: Payer: Self-pay | Admitting: Oncology

## 2017-03-07 NOTE — Telephone Encounter (Signed)
Received a call from Primary at [facility] to schedule an urgent appt. Pt was originally scheduled at [facility], but preferred the [place] campus. Pt has been scheduled for the pt to see Dr. Alen Blew on 11/20 at [place] to have the pt arrive 30 minutes early.

## 2017-03-07 NOTE — Telephone Encounter (Signed)
Spoke with Seth Bake at [facility] in [place] and she was able to switch pt appt to the same day and time but at the [place] location. I left the pt a voicemail on his home phone with these updated details and spoke with him on his cell to let him know this. He said he would listen to the vm I left on the home machine for the address and phone number for this office. Thanks!

## 2017-03-12 ENCOUNTER — Ambulatory Visit: Payer: Medicare HMO | Admitting: Oncology

## 2017-03-12 ENCOUNTER — Telehealth: Payer: Self-pay | Admitting: Oncology

## 2017-03-12 ENCOUNTER — Other Ambulatory Visit: Payer: Self-pay | Admitting: Otolaryngology

## 2017-03-12 ENCOUNTER — Other Ambulatory Visit: Payer: Self-pay | Admitting: Urology

## 2017-03-12 VITALS — BP 145/81 | HR 103 | Temp 98.4°F | Resp 18 | Ht 66.0 in | Wt 153.8 lb

## 2017-03-12 DIAGNOSIS — D649 Anemia, unspecified: Secondary | ICD-10-CM

## 2017-03-12 DIAGNOSIS — C61 Malignant neoplasm of prostate: Secondary | ICD-10-CM

## 2017-03-12 DIAGNOSIS — R590 Localized enlarged lymph nodes: Secondary | ICD-10-CM | POA: Diagnosis not present

## 2017-03-12 NOTE — Progress Notes (Signed)
Reason for Referral: Prostate cancer.  HPI: This is a pleasant 76 year old gentleman currently of [place] where he lived for the last 41 years.  He is a gentleman with history of remote aneurysm and a diagnosis of prostate cancer in 2017.  He presented acutely in April 2017 with symptoms of urinary retention and a PSA of 95.5.  He underwent a TURP procedure done by Dr. Jeffie Pollock on July 29, 2016.  The biopsy at the time showed prostate cancer with Gleason score 3+3 = 6 involving 5% of the tissue in 1 of the cores.  He subsequently underwent a Fluciclovine scan in October 2017 which showed bulky periaortic and retroperitoneal lymph nodes consistent with prostate cancer.  There is also accumulation in the internal and external iliac lymph nodes and bilateral inguinal lymphadenopathy.  There is also a cluster of rounded nodules in the left lower lobe of the lung without any radiotracer uptake.  It is unclear whether he received any treatment for his prostate cancer after that but he denies it.  He has been doing reasonably well the last few months where he presented with enlarging neck mass without any other symptoms.  He denies any dysphasia or hoarseness and subsequently was seen by his primary care provider.  He underwent imaging studies eluding CT scan of the neck and chest on 02/27/2017.  The imaging studies showed bulky lymphadenopathy throughout the thorax and the upper abdomen with splenomegaly associated with it.  His laboratory data at the time showed a hemoglobin of 7.5 with a platelet count of 119.  His peripheral smear did show evidence of leukoerythroblastic reaction.   He was seen by ENT and a aspiration biopsy obtained on 03/06/2017 was not diagnostic.  He is scheduled to have an excisional biopsy by Dr. Benjamine Mola in the near future.  Despite these findings, he is completely asymptomatic.  He denies any dysphasia or done aphasia.  He denies any weight loss.  He denies any pelvic or abdominal pain.   Continues to work full-time without any decline in his ability to do so.  He denies any fevers or chills or sweats.  He denies any early satiety.  He does not report any headaches, blurry vision, syncope or seizures.  He does not report any chest pain, palpitation, orthopnea or leg edema.  He does not report any cough, wheezing or hemoptysis.  He does not report any nausea, vomiting or abdominal pain.  He does not report any frequency urgency or hesitancy.  He does not report any hematuria or dysuria.  Remaining review of systems unremarkable.  Past Medical History:  Diagnosis Date  . Anemia   . Aneurysm ([place])    in 1987  . Bilateral hydronephrosis   . Pneumonia    hx of   . Urinary retention   :  Past Surgical History:  Procedure Laterality Date  . PROSTATE BIOPSY N/A 07/30/2015   Performed by Irine Seal, MD at [facility]S  . TRANSURETHRAL RESECTION OF THE PROSTATE (TURP) N/A 07/30/2015   Performed by Irine Seal, MD at [facility] OR[facility]  :  No current outpatient medications on file.:  No Known Allergies:  Family History  Problem Relation Age of Onset  . Dementia Mother   . Heart disease Father   . Lung cancer Brother   . Lupus Sister   :  Social History   Socioeconomic History  . Marital status: Married    Spouse name: Not on file  . Number of children: Not on file  .  Years of education: Not on file  . Highest education level: Not on file  Social Needs  . Financial resource strain: Not on file  . Food insecurity - worry: Not on file  . Food insecurity - inability: Not on file  . Transportation needs - medical: Not on file  . Transportation needs - non-medical: Not on file  Occupational History  . Not on file  Tobacco Use  . Smoking status: Former Smoker    Last attempt to quit: 04/28/1985    Years since quitting: 31.8  . Smokeless tobacco: Former Systems developer    Types: Chew  . Tobacco comment: quit 30-40 yrs. aog  Substance and Sexual Activity  . Alcohol use: No    Alcohol/week: 0.0  oz  . Drug use: No  . Sexual activity: Not on file  Other Topics Concern  . Not on file  Social History Narrative  . Not on file  :  Pertinent items are noted in HPI.  Exam: Blood pressure (!) 145/81, pulse (!) 103, temperature 98.4 F (36.9 C), temperature source Oral, resp. rate 18, height 5\' 6"  (1.676 m), weight 153 lb 12.8 oz (69.8 kg), SpO2 99 %. General appearance: alert and cooperative without distress. Throat: No oral thrush or ulcers. Neck: Large cervical mass noted on the right side of his neck.  No other neck adenopathy palpated. Resp: clear to auscultation bilaterally without wheezes or dullness to percussion. Cardio: regular rate and rhythm, S1, S2 normal, no murmur, click, rub or gallop GI: soft, non-tender; bowel sounds normal; no masses,  no organomegaly Extremities: extremities normal, atraumatic, no cyanosis or edema Pulses: 2+ and symmetric Skin: Skin color, texture, turgor normal. No rashes or lesions Lymph nodes: Cervical, supraclavicular, and axillary nodes normal.  CBC    Component Value Date/Time   WBC 11.6 (H) 02/25/2017 1442   WBC 8.2 08/01/2015 0519   RBC 2.61 (LL) 02/25/2017 1442   RBC 3.05 (L) 08/01/2015 0519   HGB 7.5 (L) 02/25/2017 1442   HCT 22.2 (L) 02/25/2017 1442   PLT 119 (L) 02/25/2017 1442   MCV 85 02/25/2017 1442   MCH 28.7 02/25/2017 1442   MCH 29.2 08/01/2015 0519   MCHC 33.8 02/25/2017 1442   MCHC 33.6 08/01/2015 0519   RDW 18.9 (H) 02/25/2017 1442   LYMPHSABS 6.5 (H) 02/25/2017 1442   MONOABS 0.3 07/07/2015 1946   EOSABS 0.1 02/25/2017 1442   BASOSABS 0.0 02/25/2017 1442     Chemistry      Component Value Date/Time   NA 141 02/25/2017 1442   K 4.5 02/25/2017 1442   CL 106 02/25/2017 1442   CO2 20 02/25/2017 1442   BUN 29 (H) 02/25/2017 1442   CREATININE 1.50 (H) 02/25/2017 1442   CREATININE 7.94 (H) 07/05/2015 1745      Component Value Date/Time   CALCIUM 9.0 02/25/2017 1442   ALKPHOS 108 02/25/2017 1442   AST 27  02/25/2017 1442   ALT 12 02/25/2017 1442   BILITOT 0.3 02/25/2017 1442       Ct Soft Tissue Neck W Contrast  Result Date: 02/27/2017 CLINICAL DATA:  Neck mass.  History prostate cancer EXAM: CT NECK WITH CONTRAST TECHNIQUE: Multidetector CT imaging of the neck was performed using the standard protocol following the bolus administration of intravenous contrast. CONTRAST:  170mL ISOVUE-300 IOPAMIDOL (ISOVUE-300) INJECTION 61% COMPARISON:  PET 01/31/2016 FINDINGS: Pharynx and larynx: Tongue and tonsils normal. Pharynx is displaced to the left due to enlarged right neck mass. No pharyngeal mass.  Larynx intact although displaced to the left significantly. Salivary glands: Parotid glands intact bilaterally. Right submandibular gland not well seen due to large lymph node mass in the right neck. Hypoplastic left submandibular gland. Thyroid: 7 mm left thyroid nodule.  Thyroid overall normal in size Lymph nodes: Enormous mass in the right neck. This measures approximately 5.8 x 8.7 x 15 cm. This mass extends from the mandible to the supraclavicular lymph nodes and posterior to the right clavicle into the right superior mediastinum. The mass shows mild heterogeneous enhancement and engulfs multiple vessels. The right jugular vein is occluded by the mass. On the left, there is a small 11 mm left level 2 lymph node which is indeterminate. Evaluation is difficult in this case due to lack of body fat Vascular: Occluded right jugular vein due to lymph node mass in the right neck. Both carotid arteries are patent. Left jugular vein patent. Right innominate vein not well seen and possibly occluded due to tumor. Limited intracranial: Ventricular enlargement likely due to atrophy. Edema in the right occipital lobe could be due to metastatic disease. No definite enhancing mass lesion. Further evaluation recommended. Visualized orbits: Negative Mastoids and visualized paranasal sinuses: Mild mucosal edema in the paranasal  sinuses Skeleton: Poor dentition with numerous caries and periapical lucencies due to dental infection. No skeletal metastatic disease or fracture identified. Upper chest: 2.5 cm sub solid density right upper lobe. Small patchy sub solid density in the superior segment left lower lobe. Enlarged lymph node projecting into the superior mediastinum on the right, contiguous with a large lymph node mass in the neck. 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 Right jugular vein is encased by tumor and is occluded. The mass is displacing the pharynx to the left. 11 mm left level 2 lymph node in the neck is indeterminate. The mass extends into the right supraclavicular region and into the right superior mediastinum. It is possible the right innominate vein is occluded. There is sub optimal vascular enhancement. 2.5 cm sub solid density right upper lobe. This could be due to tumor or infection. Smaller sub solid density superior segment left lower lobe. CT chest recommended for further evaluation. Edema in the right lower occipital lobe which could be due to metastatic disease versus chronic infarct. MRI brain without with contrast suggested for further evaluation. Electronically Signed   By: Franchot Gallo M.D.   On: 02/27/2017 15:51   Ct Chest W Contrast  Result Date: 02/28/2017 CLINICAL DATA:  Followup thoracic lymphadenopathy and left lung nodules. Metastatic prostate carcinoma. EXAM: CT CHEST WITH CONTRAST TECHNIQUE: Multidetector CT imaging of the chest was performed during intravenous contrast administration. CONTRAST:  166mL ISOVUE-300 IOPAMIDOL (ISOVUE-300) INJECTION 61% COMPARISON:  PET-CT 01/31/2016 FINDINGS: Cardiovascular:  No acute findings. Aortic atherosclerosis. Mediastinum/Nodes: New supraclavicular lymphadenopathy is seen, right side greater than left. Largest right supraclavicular lymphadenopathy measures 5.7 cm. New lymphadenopathy is also  seen throughout the mediastinum and bilateral internal mammary chains. Index lymph node in subcarinal region measures 2.2 cm, and index lymph node in the right internal mammary chain measures 1.6 cm. Mild bilateral hilar lymphadenopathy also appears new or increased since prior noncontrast study, with index lymph node in the right hilum measuring 1.4 cm. Numerous shotty less than 1 cm bilateral axillary lymph nodes show no significant change . Lungs/Pleura: Mild emphysema again noted with moderate cylindrical and varicose bronchiectasis seen bilaterally, greatest in the lower lobes. Mucoid-impacted bronchoceles in the left lower  lobe have mildly increased since previous study, and bronchoceles are seen in the medial right upper lobe. Upper Abdomen: Increased bulky retrocrural, upper abdominal, and retroperitoneal lymphadenopathy is seen compared to previous study. New splenomegaly also demonstrated with at least 1 low-attenuation lesion in the anterior aspect of the spleen measuring approximately 2.1 cm. Musculoskeletal: No suspicious bone lesions. New diffuse body wall edema. IMPRESSION: New bulky lymphadenopathy throughout the thorax and upper abdomen, with new splenomegaly and at least 1 low-attenuation splenic lesion. These findings are highly suspicious for lymphoproliferative disorder. Bronchiectasis with increased bronchoceles in left lower lobe and right upper lobe. Aortic Atherosclerosis (ICD10-I70.0) and Emphysema (ICD10-J43.9). Electronically Signed   By: Earle Gell M.D.   On: 02/28/2017 09:16   EXAM: NUCLEAR MEDICINE PET SKULL BASE TO THIGH  TECHNIQUE: 8.3 mCi F-18 Fluciclovine was injected intravenously. Full-ring PET imaging was performed from the skull base to thigh after the radiotracer. CT data was obtained and used for attenuation correction and anatomic localization.  FASTING BLOOD GLUCOSE:  Value: Not applicable.  COMPARISON:  CT without contrast CT pelvis  07/26/2015  FINDINGS: NECK  Radiotracer accumulation RIGHT level 2 lymph node (image 192 fused data set.) Cluster of mild activity in small LEFT supraclavicular lymph nodes (image 171). Additionally there is radiotracer accumulation within RIGHT axial lymph nodes with SUV max equal 4.1. These lymph nodes are normal in size measuring 8 mm short axis (image 86 of series 4). Activity is relatively mild with SUV max equal 4.1 (RIGHT axilla) compared to background marrow activity of 3.9.  CHEST  Mild radiotracer accumulation within mediastinal hilar lymph nodes similar to background marrow activity. Within the LEFT lower lobe cluster of rounded pulmonary nodules measuring 10 and 11 mm (image 31, series 8). There is peribronchial thickening and mild bronchiectasis in the medial aspect of the LEFT lower lobe (image 36, series 8). These nodules do not accumulate radiotracer to a significant degree.  ABDOMEN/PELVIS  No abnormal accumulation of radiotracer in liver. Physiologic uptake noted within the liver and pancreas.  Amino acid radiotracer accumulation within periaortic retroperitoneal lymph nodes. Most cranial lymph node is at the level of the renal veins. Example intense lymph nodes is LEFT of the aorta measuring 12 mm short axis (image 155, series 4) with SUV max equal 8.1. This compares to background marrow activity of 3.9. Intense radiotracer accumulation lymph node anterior to the IVC measuring 15 mm short axis (image 161, series 4) with SUV max of 7.2.  There is intense radiotracer accumulation within internal and external iliac lymph nodes. Example LEFT external iliac lymph node with SUV max equal 10.4 measures 19 mm short axis on image 195 series 4.  There bilateral intense radiotracer accumulation within inguinal lymph nodes. Example RIGHT inguinal lymph node measures 13 mm short axis with SUV max equal 8.7.  Within the most inferior LEFT apical region of  the prostate gland there is focal radiotracer accumulation with SUV max equal 10.0.  SKELETON  No focal hypermetabolic activity to suggest skeletal metastasis.  IMPRESSION: 1. Amino acid radiotracer accumulation within periaortic retroperitoneal lymph nodes consistent with prostate cancer metastasis. 2. Radiotracer accumulation within LEFT and RIGHT internal and external iliac lymph nodes as well as bilateral inguinal lymph nodes consistent with local prostate cancer metastasis 3. Focal activity within the LEFT prostate apex concerning for local recurrence. 4. Cluster of rounded nodules within the LEFT lower lobe WITHOUT significant radiotracer accumulation are favored not to related to prostate cancer metastasis. Cannot exclude other malignancies. Recommend  correlation with smoking history and at minimum recommend follow-up CT of the chest with contrast in 3 months. 5. Radiotracer accumulation within normal size RIGHT axial lymph nodes, RIGHT supraclavicular lymph nodes and RIGHT neck lymph nodes. Uptake favored benign and may relate to radiotracer infiltration in the RIGHT antecubital fossa. Findings conveyed toJOHN WRENN on 02/06/2016  at10:08.    Assessment and Plan:    75 year old gentleman with the following issues:  1.  Diffuse lymphadenopathy: He presented in November 2018 with neck mass without any other symptoms.  Imaging studies including CT scan of the neck as well as the chest showed neck and thoracic lymphadenopathy.  He also noted to have splenomegaly as well as upper abdominal adenopathy.  The differential diagnosis was discussed with the patient today.  Given that he was diagnosed with prostate cancer in 2017 with a PSA of 95 and currently he has a PSA of 390, prostate cancer is certainly a possibility.  Other malignancy could be also considered such as other solid tumor malignancy versus lymphoma.  He is scheduled to have an excisional biopsy of this neck  mass to be done on 03/26/2017.  Depending on these results this will dictate his treatment.  Prostate cancer directed treatment would be certainly different than treating lymphoma.  2.  Prostate cancer diagnosed in April 2017: He presented with urinary retention and a PSA of 95.5.  Imaging obtained in October 2017 showed diffuse lymphadenopathy which suggest that his likely malignancy is prostate cancer only with no additional malignancy.  Obtaining tissue biopsy will certainly confirm that.  The natural course of this disease was reviewed with the patient and he will likely require treatment with androgen deprivation therapy plus systemic chemotherapy if the tissue biopsy confirmed the presence of prostate cancer he would be a reasonable candidate after androgen deprivation.  3.  Anemia: Appears to be multifactorial related to malignancy and possible infiltration of prostate cancer into the bone marrow.  His splenomegaly could be related to exttramedullary hematopoiesis related to prostate cancer infiltration of the bone marrow.  This could also be a lymphoproliferative disorder in addition to his prostate cancer.  Tissue biopsy will help determine that.  4.  Follow-up: Will be following his tissue biopsy as well as repeat PET imaging that is scheduled for December 2018.  His case will be discussed in the GU tumor board as well.

## 2017-03-12 NOTE — Telephone Encounter (Signed)
Gave avs and calendar for December  °

## 2017-03-21 ENCOUNTER — Encounter (HOSPITAL_BASED_OUTPATIENT_CLINIC_OR_DEPARTMENT_OTHER): Payer: Self-pay | Admitting: *Deleted

## 2017-03-22 ENCOUNTER — Encounter (HOSPITAL_BASED_OUTPATIENT_CLINIC_OR_DEPARTMENT_OTHER)
Admission: RE | Admit: 2017-03-22 | Discharge: 2017-03-22 | Disposition: A | Discharge status: Home / Self Care | Payer: Medicare HMO | Source: Ambulatory Visit | Attending: Otolaryngology | Admitting: Otolaryngology

## 2017-03-22 DIAGNOSIS — R339 Retention of urine, unspecified: Secondary | ICD-10-CM | POA: Insufficient documentation

## 2017-03-22 DIAGNOSIS — Z01812 Encounter for preprocedural laboratory examination: Secondary | ICD-10-CM | POA: Diagnosis not present

## 2017-03-22 DIAGNOSIS — E875 Hyperkalemia: Secondary | ICD-10-CM | POA: Diagnosis not present

## 2017-03-22 DIAGNOSIS — N320 Bladder-neck obstruction: Secondary | ICD-10-CM | POA: Diagnosis not present

## 2017-03-22 DIAGNOSIS — N184 Chronic kidney disease, stage 4 (severe): Secondary | ICD-10-CM | POA: Insufficient documentation

## 2017-03-22 DIAGNOSIS — D649 Anemia, unspecified: Secondary | ICD-10-CM | POA: Diagnosis not present

## 2017-03-22 DIAGNOSIS — N19 Unspecified kidney failure: Secondary | ICD-10-CM | POA: Insufficient documentation

## 2017-03-22 DIAGNOSIS — N179 Acute kidney failure, unspecified: Secondary | ICD-10-CM | POA: Diagnosis not present

## 2017-03-22 LAB — CBC
HEMATOCRIT: 19.4 % — AB (ref 39.0–52.0)
HEMOGLOBIN: 6.1 g/dL — AB (ref 13.0–17.0)
MCH: 28.6 pg (ref 26.0–34.0)
MCHC: 31.4 g/dL (ref 30.0–36.0)
MCV: 91.1 fL (ref 78.0–100.0)
Platelets: 93 10*3/uL — ABNORMAL LOW (ref 150–400)
RBC: 2.13 MIL/uL — ABNORMAL LOW (ref 4.22–5.81)
RDW: 19.8 % — ABNORMAL HIGH (ref 11.5–15.5)
WBC: 16.1 10*3/uL — AB (ref 4.0–10.5)

## 2017-03-22 NOTE — Progress Notes (Signed)
Hemoglobin 6.1, notified Tami at Dr. Deeann Saint office of lab result and  that pt will have to be moved to [place].  Dr. Smith Robert notified.

## 2017-03-26 ENCOUNTER — Other Ambulatory Visit: Payer: Self-pay

## 2017-03-26 ENCOUNTER — Encounter (HOSPITAL_COMMUNITY): Payer: Self-pay | Admitting: *Deleted

## 2017-03-26 NOTE — Progress Notes (Signed)
Pt denies SOB, chest pain, and being under the care of a cardiologist. Pt denies having a stress test and cardiac cath. Pt made aware to stop taking Aspirin, vitamins, fish oil and herbal medications. Do not take any NSAIDs ie: Ibuprofen, Advil, Naproxen (Aleve), Motrin, BC and Goody Powder or any medication containing Aspirin. Pt verbalized understanding of all pre-op instructions.

## 2017-03-26 NOTE — Progress Notes (Signed)
Dr. Tobias Alexander, Anesthesia, made aware of abnormal labs. MD advised that pt be evaluated upon arrival on DOS.

## 2017-03-27 ENCOUNTER — Encounter (HOSPITAL_COMMUNITY)
Admission: RE | Disposition: A | Discharge status: Home / Self Care | Payer: Self-pay | Source: Ambulatory Visit | Attending: Otolaryngology

## 2017-03-27 ENCOUNTER — Ambulatory Visit (HOSPITAL_BASED_OUTPATIENT_CLINIC_OR_DEPARTMENT_OTHER): Payer: Medicare HMO | Admitting: Anesthesiology

## 2017-03-27 ENCOUNTER — Ambulatory Visit (HOSPITAL_COMMUNITY)
Admission: RE | Admit: 2017-03-27 | Discharge: 2017-03-27 | Disposition: A | Discharge status: Home / Self Care | Payer: Medicare HMO | Source: Ambulatory Visit | Attending: Otolaryngology | Admitting: Otolaryngology

## 2017-03-27 ENCOUNTER — Encounter (HOSPITAL_COMMUNITY): Payer: Self-pay | Admitting: Anesthesiology

## 2017-03-27 DIAGNOSIS — N189 Chronic kidney disease, unspecified: Secondary | ICD-10-CM | POA: Insufficient documentation

## 2017-03-27 DIAGNOSIS — C8311 Mantle cell lymphoma, lymph nodes of head, face, and neck: Secondary | ICD-10-CM | POA: Insufficient documentation

## 2017-03-27 DIAGNOSIS — R221 Localized swelling, mass and lump, neck: Secondary | ICD-10-CM | POA: Diagnosis present

## 2017-03-27 DIAGNOSIS — D631 Anemia in chronic kidney disease: Secondary | ICD-10-CM | POA: Insufficient documentation

## 2017-03-27 DIAGNOSIS — I129 Hypertensive chronic kidney disease with stage 1 through stage 4 chronic kidney disease, or unspecified chronic kidney disease: Secondary | ICD-10-CM | POA: Diagnosis not present

## 2017-03-27 DIAGNOSIS — D487 Neoplasm of uncertain behavior of other specified sites: Secondary | ICD-10-CM | POA: Diagnosis not present

## 2017-03-27 DIAGNOSIS — Z87891 Personal history of nicotine dependence: Secondary | ICD-10-CM | POA: Insufficient documentation

## 2017-03-27 HISTORY — DX: Localized swelling, mass and lump, neck: R22.1

## 2017-03-27 HISTORY — PX: MASS BIOPSY: SHX5445

## 2017-03-27 LAB — BASIC METABOLIC PANEL
Anion gap: 12 (ref 5–15)
BUN: 22 mg/dL — ABNORMAL HIGH (ref 6–20)
CALCIUM: 8.8 mg/dL — AB (ref 8.9–10.3)
CO2: 20 mmol/L — ABNORMAL LOW (ref 22–32)
CREATININE: 1.55 mg/dL — AB (ref 0.61–1.24)
Chloride: 106 mmol/L (ref 101–111)
GFR, EST AFRICAN AMERICAN: 49 mL/min — AB (ref >60)
GFR, EST NON AFRICAN AMERICAN: 42 mL/min — AB (ref >60)
Glucose, Bld: 89 mg/dL (ref 65–99)
Potassium: 4 mmol/L (ref 3.5–5.1)
SODIUM: 138 mmol/L (ref 135–145)

## 2017-03-27 LAB — CBC
HCT: 17.5 % — ABNORMAL LOW (ref 39.0–52.0)
Hemoglobin: 5.6 g/dL — CL (ref 13.0–17.0)
MCH: 29.5 pg (ref 26.0–34.0)
MCHC: 32 g/dL (ref 30.0–36.0)
MCV: 92.1 fL (ref 78.0–100.0)
PLATELETS: 77 10*3/uL — AB (ref 150–400)
RBC: 1.9 MIL/uL — AB (ref 4.22–5.81)
RDW: 20.2 % — ABNORMAL HIGH (ref 11.5–15.5)
WBC: 14.7 10*3/uL — AB (ref 4.0–10.5)

## 2017-03-27 LAB — TYPE AND SCREEN
ABO/RH(D): O POS
Antibody Screen: NEGATIVE

## 2017-03-27 SURGERY — BIOPSY, MASS, NECK
Anesthesia: General | Site: Neck | Laterality: Right

## 2017-03-27 MED ORDER — LIDOCAINE-EPINEPHRINE 1 %-1:100000 IJ SOLN
INTRAMUSCULAR | Status: DC | PRN
  Administered 2017-03-27: 1 mL

## 2017-03-27 MED ORDER — OXYCODONE-ACETAMINOPHEN 5-325 MG PO TABS: 1.0000 | ORAL_TABLET | ORAL | 0 refills | Status: DC | PRN

## 2017-03-27 MED ORDER — DEXAMETHASONE SODIUM PHOSPHATE 4 MG/ML IJ SOLN
INTRAMUSCULAR | Status: DC | PRN
  Administered 2017-03-27: 10 mg via INTRAVENOUS

## 2017-03-27 MED ORDER — LIDOCAINE 2% (20 MG/ML) 5 ML SYRINGE
INTRAMUSCULAR | Status: DC | PRN
  Administered 2017-03-27: 60 mg via INTRAVENOUS
  Administered 2017-03-27: 40 mg via INTRAVENOUS

## 2017-03-27 MED ORDER — CEFAZOLIN SODIUM-DEXTROSE 2-3 GM-%(50ML) IV SOLR
INTRAVENOUS | Status: DC | PRN
  Administered 2017-03-27: 2 g via INTRAVENOUS

## 2017-03-27 MED ORDER — PROPOFOL 10 MG/ML IV BOLUS
INTRAVENOUS | Status: AC
  Filled 2017-03-27: qty 40

## 2017-03-27 MED ORDER — LIDOCAINE-EPINEPHRINE 1 %-1:100000 IJ SOLN
INTRAMUSCULAR | Status: AC
  Filled 2017-03-27: qty 1

## 2017-03-27 MED ORDER — SUCCINYLCHOLINE CHLORIDE 20 MG/ML IJ SOLN
INTRAMUSCULAR | Status: DC | PRN
  Administered 2017-03-27: 70 mg via INTRAVENOUS

## 2017-03-27 MED ORDER — SCOPOLAMINE 1 MG/3DAYS TD PT72
1.0000 | MEDICATED_PATCH | Freq: Once | TRANSDERMAL | Status: DC | PRN
  Filled 2017-03-27: qty 1

## 2017-03-27 MED ORDER — PROPOFOL 10 MG/ML IV BOLUS
INTRAVENOUS | Status: DC | PRN
  Administered 2017-03-27: 100 mg via INTRAVENOUS
  Administered 2017-03-27 (×2): 50 mg via INTRAVENOUS

## 2017-03-27 MED ORDER — FENTANYL CITRATE (PF) 100 MCG/2ML IJ SOLN
INTRAMUSCULAR | Status: DC | PRN
  Administered 2017-03-27: 25 ug via INTRAVENOUS

## 2017-03-27 MED ORDER — ONDANSETRON HCL 4 MG/2ML IJ SOLN: 4.0000 mg | Freq: Once | INTRAMUSCULAR | Status: DC | PRN

## 2017-03-27 MED ORDER — ONDANSETRON HCL 4 MG/2ML IJ SOLN
INTRAMUSCULAR | Status: DC | PRN
  Administered 2017-03-27: 4 mg via INTRAVENOUS

## 2017-03-27 MED ORDER — CEFAZOLIN SODIUM 1 G IJ SOLR
INTRAMUSCULAR | Status: AC
  Filled 2017-03-27: qty 20

## 2017-03-27 MED ORDER — FENTANYL CITRATE (PF) 100 MCG/2ML IJ SOLN: 25.0000 ug | INTRAMUSCULAR | Status: DC | PRN

## 2017-03-27 MED ORDER — 0.9 % SODIUM CHLORIDE (POUR BTL) OPTIME
TOPICAL | Status: DC | PRN
  Administered 2017-03-27: 1000 mL

## 2017-03-27 MED ORDER — FENTANYL CITRATE (PF) 250 MCG/5ML IJ SOLN
INTRAMUSCULAR | Status: AC
  Filled 2017-03-27: qty 5

## 2017-03-27 MED ORDER — PHENYLEPHRINE HCL 10 MG/ML IJ SOLN
INTRAVENOUS | Status: DC | PRN
  Administered 2017-03-27: 50 ug/min via INTRAVENOUS

## 2017-03-27 MED ORDER — LACTATED RINGERS IV SOLN
INTRAVENOUS | Status: DC
  Administered 2017-03-27 (×2): via INTRAVENOUS

## 2017-03-27 SURGICAL SUPPLY — 31 items
BLADE 10 SAFETY STRL DISP (BLADE) IMPLANT
BLADE SURG 15 STRL LF DISP TIS (BLADE) ×1 IMPLANT
BLADE SURG 15 STRL SS (BLADE) ×2
CANISTER SUCT 3000ML PPV (MISCELLANEOUS) ×3 IMPLANT
CLEANER TIP ELECTROSURG 2X2 (MISCELLANEOUS) IMPLANT
COVER SURGICAL LIGHT HANDLE (MISCELLANEOUS) ×3 IMPLANT
DERMABOND ADVANCED (GAUZE/BANDAGES/DRESSINGS) ×2
DERMABOND ADVANCED .7 DNX12 (GAUZE/BANDAGES/DRESSINGS) ×1 IMPLANT
DRAPE HALF SHEET 40X57 (DRAPES) ×3 IMPLANT
ELECT COATED BLADE 2.86 ST (ELECTRODE) IMPLANT
ELECT NEEDLE BLADE 2-5/6 (NEEDLE) ×3 IMPLANT
ELECT REM PT RETURN 9FT ADLT (ELECTROSURGICAL) ×3
ELECTRODE REM PT RTRN 9FT ADLT (ELECTROSURGICAL) ×1 IMPLANT
GLOVE ECLIPSE 7.5 STRL STRAW (GLOVE) ×3 IMPLANT
GOWN STRL REUS W/ TWL LRG LVL3 (GOWN DISPOSABLE) ×2 IMPLANT
GOWN STRL REUS W/TWL LRG LVL3 (GOWN DISPOSABLE) ×4
HEMOSTAT SURGICEL .5X2 ABSORB (HEMOSTASIS) ×3 IMPLANT
KIT BASIN OR (CUSTOM PROCEDURE TRAY) ×3 IMPLANT
KIT ROOM TURNOVER OR (KITS) ×3 IMPLANT
NEEDLE HYPO 25GX1X1/2 BEV (NEEDLE) ×3 IMPLANT
NS IRRIG 1000ML POUR BTL (IV SOLUTION) ×3 IMPLANT
PAD ARMBOARD 7.5X6 YLW CONV (MISCELLANEOUS) ×6 IMPLANT
PENCIL BUTTON HOLSTER BLD 10FT (ELECTRODE) ×3 IMPLANT
SPECIMEN JAR SMALL (MISCELLANEOUS) ×3 IMPLANT
SUT MNCRL AB 4-0 PS2 18 (SUTURE) ×3 IMPLANT
SUT SILK 2 0 (SUTURE)
SUT SILK 2-0 18XBRD TIE 12 (SUTURE) IMPLANT
SYR BULB 3OZ (MISCELLANEOUS) ×3 IMPLANT
TOWEL OR 17X24 6PK STRL BLUE (TOWEL DISPOSABLE) ×3 IMPLANT
TRAY ENT MC OR (CUSTOM PROCEDURE TRAY) ×3 IMPLANT
WATER STERILE IRR 1000ML POUR (IV SOLUTION) IMPLANT

## 2017-03-27 NOTE — Progress Notes (Signed)
CRITICAL VALUE ALERT  Critical Value:  Hgb 5.6, Platelets 77 (being re-ran)  Date & Time Notied:  03/27/2017 08:30  Provider Notified: Dr. Benjamine Mola, Dr. Roanna Banning  Orders Received/Actions taken: No new orders at this time. Pt to proceed with surgery as planned.

## 2017-03-27 NOTE — H&P (Signed)
Cc: Right neck mass  HPI: The patient is a 77 year old male who presents today for evaluation of his right neck mass.  The patient is seen in consultation requested by [facility]  According to the patient, he first noted a right neck mass 2 months ago.  The mass is not painful to touch.  The size of the mass has gradually increased.  The patient was seen at an [facility] [place] last week.  He underwent a CT scan, which showed a large 8.7 cm right neck mass.  The mass extends from the mandible to the right superior mediastinum.  The mass engulfs multiple vessels.  The right jugular vein is occluded by the mass.  The patient also has a small 11 mm left Level 2 lymph node.  The patient also has a 2.7 cm subsolid density in the right upper lobe of his chest.  The findings are concerning for metastatic disease.  The patient quit the use of tobacco more than 40 years ago.  He currently denies any dysphagia, odynophagia, or dyspnea.  He has no previous ENT surgery.  He also denies any recent weight loss.   The patient's review of systems (constitutional, eyes, ENT, cardiovascular, respiratory, GI, musculoskeletal, skin, neurologic, psychiatric, endocrine, hematologic, allergic) is noted in the ROS questionnaire.  It is reviewed with the patient.  Family health history: Dementia, anemia, heart disease, cancer.  Major events: None.  Ongoing medical problems: Anemia, CKD/ renal failure, hypertension.  Social history: The patient is married. He is a former smoker. He denies the use of alcohol or illegal drugs.   Exam: General: Communicates without difficulty, well nourished, no acute distress. Head: Normocephalic, no evidence injury, no tenderness, facial buttresses intact without stepoff. Face/sinus: No tenderness to palpation and percussion. Facial movement is normal and symmetric. Eyes: PERRL, EOMI. No scleral icterus, conjunctivae clear. Neuro: CN II exam reveals vision grossly intact.  No nystagmus at  any point of gaze. Ears: Auricles well formed without lesions.  Ear canals are intact without mass or lesion.  No erythema or edema is appreciated.  The TMs are intact without fluid. Nose: External evaluation reveals normal support and skin without lesions.  Dorsum is intact.  Anterior rhinoscopy reveals healthy pink mucosa over anterior aspect of inferior turbinates and intact septum.  No purulence noted. Oral:  Oral cavity and oropharynx are intact, symmetric, without erythema or edema.  Mucosa is moist without lesions. Neck:  The patient has an enormous right neck mass.  It measures 8.7 cm.  It extends from the right mandible to the right superior mediastinum.  Neuro:  CN 2-12 grossly intact. Gait normal.   Procedure:  Flexible Fiberoptic Laryngoscopy Risks, benefits, and alternatives of flexible endoscopy were explained to the patient.  Specific mention was made of the risk of throat numbness with difficulty swallowing, possible bleeding from the nose and mouth, and pain from the procedure.  The patient gave oral consent to proceed.  The nasal cavities were decongested and anesthetised with a combination of oxymetazoline and 4% lidocaine solution.  The flexible scope was inserted into the right nasal cavity and advanced towards the nasopharynx.  Visualized mucosa over the turbinates and septum were as described above.  The nasopharynx was clear.  Oropharyngeal walls were symmetric and mobile without lesion, mass, or edema.  Hypopharynx was also without  lesion or edema.  Larynx was mobile without lesions. Supraglottic structures were free of edema, mass, and asymmetry.  True vocal folds were white without  mass or lesion.  Base of tongue was within normal limits.   Assessment 1.  The patient has an enormous right neck mass.  It measures 8.7 cm.  It extends from the right mandible to the right superior mediastinum.  The mass shows heterogeneous enhancement.  He also has a 2.5 cm mass within the right upper  lobe of his chest.   2.  The patient has a normal endoscopy examination today.  No suspicious mass or lesion is noted within his nasal cavities, pharynx, or larynx.  Both vocal cords are mobile.    Plan  1.  The physical exam and laryngoscopy findings are reviewed with the patient.   2.  The CT images are also reviewed.  3.  Fine needle aspiration biopsy of the right neck mass is performed today without difficulty.   Addendum: FNA showed lymphocytes -- suggesting possible lymphoma. Plan open incisional biopsy today in OR.

## 2017-03-27 NOTE — Anesthesia Postprocedure Evaluation (Signed)
Anesthesia Post Note  Patient: Joel [name]  Procedure(s) Performed: OPEN NECK MASS BIOPSY OF THE RIGHT NECK (Right Neck)     Patient location during evaluation: PACU Anesthesia Type: General Level of consciousness: awake and alert Pain management: pain level controlled Vital Signs Assessment: post-procedure vital signs reviewed and stable Respiratory status: spontaneous breathing, nonlabored ventilation, respiratory function stable and patient connected to nasal cannula oxygen Cardiovascular status: blood pressure returned to baseline and stable Postop Assessment: no apparent nausea or vomiting Anesthetic complications: no    Last Vitals:  Vitals:   03/27/17 1030 03/27/17 1050  BP:  113/67  Pulse:  73  Resp:  16  Temp: 36.5 C 36.6 C  SpO2:  96%    Last Pain:  Vitals:   03/27/17 1050  TempSrc:   PainSc: 0-No pain                 Jenevie Casstevens P Dilia Alemany

## 2017-03-27 NOTE — Anesthesia Preprocedure Evaluation (Addendum)
Anesthesia Evaluation  Patient identified by MRN, date of birth, ID band Patient awake    Reviewed: Allergy & Precautions, H&P , NPO status , Patient's Chart, lab work & pertinent test results, reviewed documented beta blocker date and time   Airway Mallampati: II  TM Distance: >3 FB Neck ROM: full    Dental  (+) Missing, Chipped, Edentulous Upper, Poor Dentition Poor dentition:   Pulmonary former smoker,    Pulmonary exam normal        Cardiovascular negative cardio ROS Normal cardiovascular exam  ECHO: LV EF: 65% -   70%   Neuro/Psych Aneurysm negative psych ROS   GI/Hepatic negative GI ROS, Neg liver ROS,   Endo/Other  negative endocrine ROS  Renal/GU Renal InsufficiencyRenal disease     Musculoskeletal  right neck mass   Abdominal Normal abdominal exam  (+)   Peds  Hematology  (+) anemia , Thrombocytopenia    Anesthesia Other Findings   Reproductive/Obstetrics                           Anesthesia Physical  Anesthesia Plan  ASA: III  Anesthesia Plan: General   Post-op Pain Management:    Induction: Intravenous  PONV Risk Score and Plan: 2 and Dexamethasone and Ondansetron  Airway Management Planned: Oral ETT and Video Laryngoscope Planned  Additional Equipment:   Intra-op Plan:   Post-operative Plan: Extubation in OR  Informed Consent: I have reviewed the patients History and Physical, chart, labs and discussed the procedure including the risks, benefits and alternatives for the proposed anesthesia with the patient or authorized representative who has indicated his/her understanding and acceptance.   Dental advisory given  Plan Discussed with: CRNA  Anesthesia Plan Comments:        Anesthesia Quick Evaluation

## 2017-03-27 NOTE — Anesthesia Procedure Notes (Signed)
Procedure Name: Intubation Date/Time: 03/27/2017 8:51 AM Performed by: Lieutenant Diego, CRNA Pre-anesthesia Checklist: Patient identified, Emergency Drugs available, Suction available and Patient being monitored Patient Re-evaluated:Patient Re-evaluated prior to induction Oxygen Delivery Method: Circle system utilized Preoxygenation: Pre-oxygenation with 100% oxygen Induction Type: IV induction Ventilation: Mask ventilation without difficulty Laryngoscope Size: Glidescope Grade View: Grade I Tube type: Oral Tube size: 7.0 mm Number of attempts: 1 Airway Equipment and Method: Stylet and Oral airway Placement Confirmation: ETT inserted through vocal cords under direct vision,  positive ETCO2 and breath sounds checked- equal and bilateral Tube secured with: Tape Dental Injury: Teeth and Oropharynx as per pre-operative assessment  Comments: Elective glide scope due to large mass to right neck, extending to left neck across midline.

## 2017-03-27 NOTE — Op Note (Signed)
DATE OF PROCEDURE:  03/27/2017                              OPERATIVE REPORT  SURGEON:  Leta Baptist, MD  PREOPERATIVE DIAGNOSES: 1. Right neck mass   POSTOPERATIVE DIAGNOSES: 1. Right neck mass   PROCEDURE PERFORMED:  Open biopsy of right neck mass (CPT 38510)  ANESTHESIA:  General endotracheal tube anesthesia.  COMPLICATIONS:  None.  ESTIMATED BLOOD LOSS:  Minimal.  INDICATION FOR PROCEDURE:  Treyten Monestime is a 76 y.o. male who first noted a right neck mass 2 months ago. The size of the mass has gradually increased. The patient was seen at the [facility] emergency room. He underwent a CT scan, which showed a large 8.7 centimeter right neck mass. The mass extended from the mandible to the right superior mediastinum. The mass engulfed multiple vessels. The right jugular vein was occluded by the mass. He also has a 2.7 cm mass in the right upper lobe of his chest. His fine-needle aspiration biopsy specimens showed mostly lymphocytes. The findings were concerning for lymphoma. Based on the above findings, the decision was made for the patient to undergo the above-stated procedure.  The risks, benefits, alternatives, and details of the procedure were discussed with the patient.  Questions were invited and answered.  Informed consent was obtained.  DESCRIPTION:  The patient was taken to the operating room and placed supine on the operating table.  General endotracheal tube anesthesia was administered by the anesthesiologist.  The patient was positioned and prepped and draped in a standard fashion for right neck surgery. 1% lidocaine with 1-100,000 epinephrine was infiltrated at the planned site of incision.  A 3 cm right anterior neck incision was made. The incision was carried down to the level of the platysma muscles. Superior and inferiorly based subplatysmal flaps were elevated in a standard fashion. The sternocleidomastoid muscles were retracted laterally. The patient was noted to have a large  solid tumor. 7 incisional biopsy specimens were obtained from the right neck mass. The specimens were sent to the pathology department fresh per lymphoma protocol.  The surgical site was copiously irrigated. Hemostasis was achieved with electrocautery. The incision was closed in layers with 4-0 Vicryl and Dermabond.  The care of the patient was turned over to the anesthesiologist.  The patient was awakened from anesthesia without difficulty.  The patient was extubated and transferred to the recovery room in good condition.  OPERATIVE FINDINGS:  The patient has a large right neck mass.  SPECIMEN:  Biopsy specimens from the right neck mass.  FOLLOWUP CARE:  The patient will be discharged home once awake and alert.   The patient will follow up in my office in approximately 1 week.  Chaniya Genter W Kalii Chesmore 03/27/2017 9:35 AM

## 2017-03-27 NOTE — Discharge Instructions (Addendum)
The patient may resume all his previous activities and diet. 

## 2017-03-27 NOTE — Transfer of Care (Signed)
Immediate Anesthesia Transfer of Care Note  Patient: Joel [name]  Procedure(s) Performed: OPEN NECK MASS BIOPSY OF THE RIGHT NECK (Right Neck)  Patient Location: PACU  Anesthesia Type:General  Level of Consciousness: sedated  Airway & Oxygen Therapy: Patient Spontanous Breathing and Patient connected to face mask oxygen  Post-op Assessment: Report given to RN and Post -op Vital signs reviewed and stable  Post vital signs: Reviewed and stable  Last Vitals:  Vitals:   03/27/17 0655  BP: (!) 147/70  Pulse: 79  Resp: 20  Temp: 36.4 C  SpO2: 97%    Last Pain:  Vitals:   03/27/17 0655  TempSrc: Oral      Patients Stated Pain Goal: 3 (57/26/20 3559)  Complications: No apparent anesthesia complications

## 2017-03-28 ENCOUNTER — Encounter (HOSPITAL_COMMUNITY): Payer: Self-pay | Admitting: Otolaryngology

## 2017-04-03 ENCOUNTER — Telehealth: Payer: Self-pay | Admitting: *Deleted

## 2017-04-03 ENCOUNTER — Encounter (HOSPITAL_COMMUNITY)
Admission: RE | Admit: 2017-04-03 | Discharge: 2017-04-03 | Disposition: A | Discharge status: Home / Self Care | Payer: Medicare HMO | Source: Ambulatory Visit | Attending: Urology | Admitting: Urology

## 2017-04-03 ENCOUNTER — Other Ambulatory Visit: Payer: Self-pay | Admitting: Oncology

## 2017-04-03 DIAGNOSIS — D649 Anemia, unspecified: Secondary | ICD-10-CM

## 2017-04-03 DIAGNOSIS — C61 Malignant neoplasm of prostate: Secondary | ICD-10-CM | POA: Insufficient documentation

## 2017-04-03 MED ORDER — TECHNETIUM TC 99M MEDRONATE IV KIT
25.0000 | PACK | Freq: Once | INTRAVENOUS | Status: AC | PRN
  Administered 2017-04-03: 21 via INTRAVENOUS

## 2017-04-03 NOTE — Telephone Encounter (Signed)
I need to see him 12/13 after labs. Please let him know we will get a here for 11:30 am for labs and to see me after.

## 2017-04-03 NOTE — Telephone Encounter (Signed)
"  My father had a biopsy of his neck.  He received a call today from surgeon that he has lymphoma.  We want to make sure the appointment on 04-12-2017 will address the problem with his neck.  We know about the prostate and the bone but we are more concerned about the problem with his neck.  Return number [phone]"  Will notify Dr. Alen Blew of call information.

## 2017-04-03 NOTE — Telephone Encounter (Signed)
Called daughter Doylene Bode to notify patient and family about appointment tomorrow for lab at 11:30 followed by a visit with Dr. Alen Blew tomorrow.  "He'll be there at 11:00 to register.  I will be there at 12:00 for provider visit.  I went to the dr's office to sign release for records to be sent to Dr. Alen Blew."

## 2017-04-04 ENCOUNTER — Ambulatory Visit (HOSPITAL_COMMUNITY)
Admission: RE | Admit: 2017-04-04 | Discharge: 2017-04-04 | Disposition: A | Discharge status: Home / Self Care | Payer: Medicare HMO | Source: Ambulatory Visit | Attending: Oncology | Admitting: Oncology

## 2017-04-04 ENCOUNTER — Telehealth: Payer: Self-pay | Admitting: Pharmacist

## 2017-04-04 ENCOUNTER — Other Ambulatory Visit (HOSPITAL_BASED_OUTPATIENT_CLINIC_OR_DEPARTMENT_OTHER): Payer: Medicare HMO

## 2017-04-04 ENCOUNTER — Other Ambulatory Visit: Payer: Self-pay | Admitting: Oncology

## 2017-04-04 ENCOUNTER — Other Ambulatory Visit: Payer: Self-pay | Admitting: *Deleted

## 2017-04-04 ENCOUNTER — Ambulatory Visit (HOSPITAL_BASED_OUTPATIENT_CLINIC_OR_DEPARTMENT_OTHER): Payer: Medicare HMO

## 2017-04-04 ENCOUNTER — Telehealth: Payer: Self-pay | Admitting: Pharmacy Technician

## 2017-04-04 ENCOUNTER — Ambulatory Visit (HOSPITAL_BASED_OUTPATIENT_CLINIC_OR_DEPARTMENT_OTHER): Payer: Medicare HMO | Admitting: Oncology

## 2017-04-04 ENCOUNTER — Telehealth: Payer: Self-pay | Admitting: *Deleted

## 2017-04-04 VITALS — BP 118/69 | HR 78 | Temp 98.6°F | Resp 18 | Ht 66.0 in | Wt 158.7 lb

## 2017-04-04 DIAGNOSIS — C61 Malignant neoplasm of prostate: Secondary | ICD-10-CM | POA: Diagnosis not present

## 2017-04-04 DIAGNOSIS — D649 Anemia, unspecified: Secondary | ICD-10-CM

## 2017-04-04 DIAGNOSIS — C8318 Mantle cell lymphoma, lymph nodes of multiple sites: Secondary | ICD-10-CM

## 2017-04-04 DIAGNOSIS — C8310 Mantle cell lymphoma, unspecified site: Secondary | ICD-10-CM

## 2017-04-04 DIAGNOSIS — R972 Elevated prostate specific antigen [PSA]: Secondary | ICD-10-CM

## 2017-04-04 LAB — COMPREHENSIVE METABOLIC PANEL
ALBUMIN: 3.2 g/dL — AB (ref 3.5–5.0)
ALK PHOS: 101 U/L (ref 40–150)
ALT: 10 U/L (ref 0–55)
ANION GAP: 10 meq/L (ref 3–11)
AST: 31 U/L (ref 5–34)
BILIRUBIN TOTAL: 0.56 mg/dL (ref 0.20–1.20)
BUN: 29.2 mg/dL — ABNORMAL HIGH (ref 7.0–26.0)
CO2: 20 mEq/L — ABNORMAL LOW (ref 22–29)
Calcium: 8.8 mg/dL (ref 8.4–10.4)
Chloride: 112 mEq/L — ABNORMAL HIGH (ref 98–109)
Creatinine: 1.7 mg/dL — ABNORMAL HIGH (ref 0.7–1.3)
EGFR: 47 mL/min/{1.73_m2} — AB (ref >60)
GLUCOSE: 97 mg/dL (ref 70–140)
POTASSIUM: 4.4 meq/L (ref 3.5–5.1)
Sodium: 141 mEq/L (ref 136–145)
TOTAL PROTEIN: 8 g/dL (ref 6.4–8.3)

## 2017-04-04 LAB — MANUAL DIFFERENTIAL
ALC: 5.8 10*3/uL — AB (ref 0.9–3.3)
ANC (CHCC manual diff): 6.9 10*3/uL — ABNORMAL HIGH (ref 1.5–6.5)
BASOPHIL: 1 % (ref 0–2)
Band Neutrophils: 9 % (ref 0–10)
EOS%: 1 % (ref 0–7)
LYMPH: 27 % (ref 14–49)
METAMYELOCYTES PCT: 1 % — AB (ref 0–0)
MONO: 7 % (ref 0–14)
NRBC: 3 % — AB (ref 0–0)
PLT EST: DECREASED
SEG: 22 % — ABNORMAL LOW (ref 38–77)

## 2017-04-04 LAB — CBC WITH DIFFERENTIAL/PLATELET
HEMATOCRIT: 17.8 % — AB (ref 38.4–49.9)
HGB: 5.4 g/dL — CL (ref 13.0–17.1)
MCH: 28.1 pg (ref 27.2–33.4)
MCHC: 30.3 g/dL — ABNORMAL LOW (ref 32.0–36.0)
MCV: 92.7 fL (ref 79.3–98.0)
PLATELETS: 88 10*3/uL — AB (ref 140–400)
RBC: 1.92 10*6/uL — AB (ref 4.20–5.82)
RDW: 20.8 % — ABNORMAL HIGH (ref 11.0–14.6)
WBC: 21.6 10*3/uL — AB (ref 4.0–10.3)

## 2017-04-04 LAB — PREPARE RBC (CROSSMATCH)

## 2017-04-04 MED ORDER — DIPHENHYDRAMINE HCL 25 MG PO CAPS
ORAL_CAPSULE | ORAL | Status: AC
  Filled 2017-04-04: qty 1

## 2017-04-04 MED ORDER — SODIUM CHLORIDE 0.9 % IV SOLN
250.0000 mL | Freq: Once | INTRAVENOUS | Status: AC
  Administered 2017-04-04: 250 mL via INTRAVENOUS

## 2017-04-04 MED ORDER — DIPHENHYDRAMINE HCL 25 MG PO CAPS
25.0000 mg | ORAL_CAPSULE | Freq: Once | ORAL | Status: AC
  Administered 2017-04-04: 25 mg via ORAL

## 2017-04-04 MED ORDER — IBRUTINIB 560 MG PO TABS: 560.0000 mg | ORAL_TABLET | Freq: Every day | ORAL | 0 refills | Status: DC

## 2017-04-04 MED ORDER — ACETAMINOPHEN 325 MG PO TABS
650.0000 mg | ORAL_TABLET | Freq: Once | ORAL | Status: AC
  Administered 2017-04-04: 650 mg via ORAL

## 2017-04-04 MED ORDER — ACETAMINOPHEN 325 MG PO TABS
ORAL_TABLET | ORAL | Status: AC
  Filled 2017-04-04: qty 2

## 2017-04-04 NOTE — Telephone Encounter (Signed)
Oncology Nurse Navigator Documentation  Placed introductory call to new referral patient.  He was not available, spoke with his wife.  Introduced myself as the H&N oncology nurse navigator that works with Dr. Isidore Moos to whom he has been referred by Dr. Alen Blew.  She confirmed understanding of referral and appt date/time of 12/14/8:30.  I briefly explained my role as their navigator, indicated that I would be joining them during his appt tomorrow.  I confirmed understanding of [place] location, explained arrival and RadOnc registration process for appt.  I provided my contact information, encouraged them to call with questions/concerns prior to appt.  She verbalized understanding of information provided, expressed appreciation for my call.  Gayleen Orem, RN, BSN, [place] [facility] Nurse [place] [facility] [place] [phone]

## 2017-04-04 NOTE — Patient Instructions (Signed)

## 2017-04-04 NOTE — Telephone Encounter (Signed)
Oral Oncology Pharmacist Encounter  Received new prescription for Imbruvica (ibrutinib) for the treatment of newly diagnosed mantle cell lymphoma, planned duration until disease progression or unacceptable toxicity. Noted that patient not being started on alternative treatments at this time due to cytopenias.  Labs from today (04/04/17) assessed, noted Hgb=5.4 (pt to be transfused 2 units PRBCs), pltc=88k, WBC elevated at 21k.  Imbruvica with known anti-platelet action and risk of hemorrhage. Patient will be closely monitored due to thrombocytopenia.  Also noted SCr=1.7, est CrCl ~35 mL/min, no dose adjustments provided by manufacturer for renal dysfunction, minimally cleared through kidneys.  Current medication list in Epic reviewed, noDDIs with Imbruvica identified with Percocet.  Prescription has been e-scribed to the [facility] for benefits analysis and approval. Prior authorization will be submitted. We will follow-up with patient once copayment is known.  Oral Oncology Clinic will continue to follow for insurance authorization, copayment issues, initial counseling and start date.  Johny Drilling, PharmD, BCPS, BCOP 04/04/2017 3:42 PM Oral Oncology Clinic [phone]

## 2017-04-04 NOTE — Progress Notes (Signed)
Per Erline Levine, RN, no further labs requested by Dr. Alen Blew. F/u appt scheduled for 12/21 with labs and doctor visit. Pt aware of appointments and to call in the interim if he notices any SOB, fatigue, headaches, dizziness, or other side effects from low hemoglobin. Pt verbalized understanding.

## 2017-04-04 NOTE — Progress Notes (Signed)
Lymphoma Location(s) / Histology: 03/27/17  Diagnosis Soft tissue mass, biopsy, right neck - MANTLE CELL LYMPHOMA.  Joel [name] presented months ago with symptoms of: He presented to his PCP on 02/25/17 with 2 month history of increasing right neck mass.   Biopsies of right neck soft tissue mass revealed: Mantle Cell Lymphoma  Past/Anticipated interventions by medical oncology, if any:  Dr. 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 Imaging studies including CT scan of the neck as well as the chest showed neck and thoracic lymphadenopathy. He also noted to have splenomegaly as well as upper abdominal adenopathy.  This is biopsy-proven to be mantle cell lymphoma on March 27, 2017.  The natural course of this disease was discussed today with the patient as well as different treatment options.  If the advanced nature is of this disease and the fact that he is not a stem cell transplant candidate, the treatment goal at this point is palliative.  Treatment options were include multiagent systemic chemotherapy versus oral targeted therapy.  Despite his reasonable performance status, he has profound pancytopenia that could be related to his prostate cancer as well and make systemic chemotherapy very problematic.  Risks and benefits of using Imbruvica was discussed today.  Complications include thrombosis, bleeding and dermatological toxicity were reviewed.  It might offer a better option for him at least for the time being until his cytopenias correct and potentially we can use systemic chemotherapy at a later date.  Plan to get this treatment started in the immediate future.  He will have a PET scan obtained as well to complete the staging.  2. Prostate cancer diagnosed in April 2017: He presented with urinary retention and a PSA of 95.5. Imaging  obtained in October 2017 showed diffuse lymphadenopathy which suggest that his likely malignancy is prostate cancer only with no additional malignancy. Obtaining tissue biopsy will certainly confirm that.  I recommend proceeding with androgen deprivation therapy only without any additional therapy.  Given his poor prognosis associated with mantle cell lymphoma, androgen deprivation therapy should be adequate.  I am more than happy to arrange for him to get Lupron if he is not planning to receive it under the care of Dr. Jeffie Pollock.  He has no evidence of bone metastasis noted on his bone scan which I have reviewed personally today.  3. Anemia: Appears to be multifactorial related to malignancy and possible infiltration of prostate cancer into the bone marrow.  His peripheral smear today shows circulating lymphoma cells and likely this is related to his lymphoma.  We will set him up with a 2 units of packed red cell transfusion to help his symptoms.  We will arrange for a bone marrow biopsy as well to complete his lymphoma staging and evaluate whether he has infiltrative prostate cancer in his bone marrow as well.  4.  Enlarging neck mass: This could cause immediate problems with airway obstruction if not treated.  I will refer him to radiation oncology for an urgent evaluation and possible radiation therapy.  He  5. Follow-up: Will be next week to follow his progress.   Weight changes, if any, over the past 6 months: He reports he has gained a little weight recently.   Recurrent fevers, or drenching night sweats, if any: He denies  SAFETY ISSUES:  Prior radiation? No  Pacemaker/ICD? No  Possible current pregnancy? N/A  Is the patient on methotrexate? No  Current Complaints / other details:    BP (!) 147/76   Pulse 67   Temp 98.3 F (36.8 C)   Ht 5' 6"  (1.676 m)   Wt 162 lb 6.4 oz (73.7 kg)   SpO2 97% Comment: room air  BMI 26.21 kg/m    Wt Readings from Last 3  Encounters:  04/05/17 162 lb 6.4 oz (73.7 kg)  04/04/17 158 lb 11.2 oz (72 kg)  03/27/17 153 lb (69.4 kg)

## 2017-04-04 NOTE — Progress Notes (Signed)
Hematology and Oncology Follow Up Visit  Joel [name] [id number] [birth year] 76 y.o. 04/04/2017 12:33 PM [name], MDSanford, [name], MD   Principle Diagnosis: 76 year old gentleman with the following issues:  1.  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  He presented with urinary retention and a PSA of 95.5.  He underwent a TURP procedure which showed a prostate cancer Gleason score 3+3 = 6. Fluciclovine scan in October 2017 which showed bulky periaortic and retroperitoneal lymph nodes consistent with prostate cancer.   2.  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  This was confirmed in December 2018.   Prior Therapy: He is status post excisional biopsy of a right neck mass completed on 03/27/2017.  Current therapy: Under consideration to start therapy.  Interim History: Joel [name] presents today for a follow-up visit.  Since the last visit, he underwent excisional biopsy performed by Dr. Benjamine Mola which he tolerated well on 03/27/2017.  The biopsy showed mantle cell lymphoma.  Clinically, he reports no dramatic changes in his health although he is reporting more fatigue and slight dyspnea on exertion.  He denies any changes in his performance status or activity level.  He still able to drive and work almost daily.  He denies any active bleeding or petechiae.  He denies any weight loss or appetite changes.  He denies any wheezing or difficulty breathing.  He does not report any headaches, blurry vision, syncope or seizures.  He does not report any chest pain, palpitation, orthopnea or leg edema.  He does not report any cough, wheezing or hemoptysis.  He does not report any nausea, vomiting or abdominal pain.  He does not report any frequency urgency or hesitancy.  He does not report any hematuria or dysuria.  Remaining review of systems  unremarkable.   Medications: I have reviewed the patient's current medications.  Current Outpatient Medications  Medication Sig Dispense Refill  . Ibrutinib (IMBRUVICA) 560 MG TABS Take 560 mg by mouth daily. 30 tablet 0  . oxyCODONE-acetaminophen (ROXICET) 5-325 MG tablet Take 1 tablet by mouth every 4 (four) hours as needed for severe pain. 15 tablet 0   No current facility-administered medications for this visit.      Allergies: No Known Allergies  Past Medical History, Surgical history, Social history, and Family History were reviewed and updated.  Physical Exam: Blood pressure 118/69, pulse 78, temperature 98.6 F (37 C), temperature source Oral, resp. rate 18, height 5' 6"  (1.676 m), weight 158 lb 11.2 oz (72 kg), SpO2 100 %. ECOG:1 General appearance: alert and cooperative appeared without distress. Head: Normocephalic, without obvious abnormality Neck: Large neck mass on the left side that appears to be bigger compared to previous examination. Lymph nodes: Cervical, supraclavicular, and axillary nodes normal. Heart:regular rate and rhythm, S1, S2 normal, no murmur, click, rub or gallop Lung:chest clear, no wheezing, rales, normal symmetric air entry.  Abdomin: soft, non-tender, without masses or organomegaly EXT:no erythema, induration, or nodules   Lab Results: Lab Results  Component Value Date   WBC 21.6 (H) 04/04/2017   HGB 5.4 (LL) 04/04/2017   HCT 17.8 (L) 04/04/2017   MCV 92.7 04/04/2017   PLT 88 (L) 04/04/2017     Chemistry      Component Value Date/Time   NA 141 04/04/2017 1030   K 4.4 04/04/2017 1030   CL 106 03/27/2017  0713   CO2 20 (L) 04/04/2017 1030   BUN 29.2 (H) 04/04/2017 1030   CREATININE 1.7 (H) 04/04/2017 1030      Component Value Date/Time   CALCIUM 8.8 04/04/2017 1030   ALKPHOS 101 04/04/2017 1030   AST 31 04/04/2017 1030   ALT 10 04/04/2017 1030   BILITOT 0.56 04/04/2017 1030     Peripheral smear was personally reviewed today: No  evidence of schistocytes or red cell fragments noted.  Circulating abnormal lymphocytes noted on the smear.  Radiological Studies: Nm Bone Scan Whole Body  Result Date: 04/03/2017 CLINICAL DATA:  Prostate cancer. New neck mass and lymphadenopathy consistent with lymphoma EXAM: NUCLEAR MEDICINE WHOLE BODY BONE SCAN TECHNIQUE: Whole body anterior and posterior images were obtained approximately 3 hours after intravenous injection of radiopharmaceutical. RADIOPHARMACEUTICALS:  Twenty-one mCi Technetium-41mMDP IV COMPARISON:  CT 04/03/2017, PET-CT 01/31/2016 FINDINGS: No abnormal accumulation of radiotracer within the axillary or appendicular skeleton to localize prostate cancer metastasis. Urine within the bladder obscures portion of the sacrum. IMPRESSION: No scintigraphic evidence skeletal metastasis. Electronically Signed   By: SSuzy BouchardM.D.   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  Imaging studies including CT scan of the neck as well as the chest showed neck and thoracic lymphadenopathy.  He also noted to have splenomegaly as well as upper abdominal adenopathy.  This is biopsy-proven to be mantle cell lymphoma on March 27, 2017.  The natural course of this disease was discussed today with the patient as well as different treatment options.  If the advanced nature is of this disease and the fact that he is not a stem cell transplant candidate, the treatment goal at this point is palliative.  Treatment options were include multiagent systemic chemotherapy versus oral targeted therapy.  Despite his reasonable performance status, he has profound pancytopenia that could be related to his prostate cancer as well and make systemic chemotherapy very problematic.  Risks and benefits of using Imbruvica was discussed today.  Complications  include thrombosis, bleeding and dermatological toxicity were reviewed.  It might offer a better option for him at least for the time being until his cytopenias correct and potentially we can use systemic chemotherapy at a later date.  Plan to get this treatment started in the immediate future.  He will have a PET scan obtained as well to complete the staging.  2.  Prostate cancer diagnosed in April 2017: He presented with urinary retention and a PSA of 95.5.  Imaging obtained in October 2017 showed diffuse lymphadenopathy which suggest that his likely malignancy is prostate cancer only with no additional malignancy.  Obtaining tissue biopsy will certainly confirm that.  I recommend proceeding with androgen deprivation therapy only without any additional therapy.  Given his poor prognosis associated with mantle cell lymphoma, androgen deprivation therapy should be adequate.  I am more than happy to arrange for him to get Lupron if he is not planning to receive it under the care of Dr. WJeffie Pollock  He has no evidence of bone metastasis noted on his bone scan which I have reviewed personally today.  3.  Anemia: Appears to be multifactorial related to malignancy and possible infiltration of prostate cancer into the bone marrow.  His peripheral smear today shows circulating lymphoma cells and likely this is related to his lymphoma.  We will  set him up with a 2 units of packed red cell transfusion to help his symptoms.  We will arrange for a bone marrow biopsy as well to complete his lymphoma staging and evaluate whether he has infiltrative prostate cancer in his bone marrow as well.  4.  Enlarging neck mass: This could cause immediate problems with airway obstruction if not treated.  I will refer him to radiation oncology for an urgent evaluation and possible radiation therapy.  He  5.  Follow-up: Will be next week to follow his progress.       Zola Button, MD 12/13/201812:33 PM

## 2017-04-04 NOTE — Telephone Encounter (Signed)
Oral Oncology Patient Advocate Encounter  Received notification from [place] that prior authorization for Imbruvica is required.  PA submitted on CoverMyMeds Key [id number] Status is pending  Oral Oncology Clinic will continue to follow.  Fabio Asa. Melynda Keller, [place] Patient [place] [phone] 04/04/2017 4:13 PM

## 2017-04-05 ENCOUNTER — Ambulatory Visit
Admission: RE | Admit: 2017-04-05 | Discharge: 2017-04-05 | Disposition: A | Discharge status: Home / Self Care | Payer: Medicare HMO | Source: Ambulatory Visit | Attending: Radiation Oncology | Admitting: Radiation Oncology

## 2017-04-05 ENCOUNTER — Encounter: Payer: Self-pay | Admitting: *Deleted

## 2017-04-05 ENCOUNTER — Encounter: Payer: Self-pay | Admitting: Radiation Oncology

## 2017-04-05 VITALS — BP 147/76 | HR 67 | Temp 98.3°F | Ht 66.0 in | Wt 162.4 lb

## 2017-04-05 DIAGNOSIS — Z8546 Personal history of malignant neoplasm of prostate: Secondary | ICD-10-CM | POA: Diagnosis not present

## 2017-04-05 DIAGNOSIS — Z8249 Family history of ischemic heart disease and other diseases of the circulatory system: Secondary | ICD-10-CM | POA: Insufficient documentation

## 2017-04-05 DIAGNOSIS — Z79899 Other long term (current) drug therapy: Secondary | ICD-10-CM | POA: Diagnosis not present

## 2017-04-05 DIAGNOSIS — C8311 Mantle cell lymphoma, lymph nodes of head, face, and neck: Secondary | ICD-10-CM | POA: Diagnosis not present

## 2017-04-05 DIAGNOSIS — Z510 Encounter for antineoplastic radiation therapy: Secondary | ICD-10-CM | POA: Diagnosis not present

## 2017-04-05 DIAGNOSIS — Z87891 Personal history of nicotine dependence: Secondary | ICD-10-CM | POA: Insufficient documentation

## 2017-04-05 DIAGNOSIS — Z801 Family history of malignant neoplasm of trachea, bronchus and lung: Secondary | ICD-10-CM | POA: Diagnosis not present

## 2017-04-05 DIAGNOSIS — Z820 Family history of epilepsy and other diseases of the nervous system: Secondary | ICD-10-CM | POA: Diagnosis not present

## 2017-04-05 DIAGNOSIS — Z8701 Personal history of pneumonia (recurrent): Secondary | ICD-10-CM | POA: Insufficient documentation

## 2017-04-05 LAB — TYPE AND SCREEN
ABO/RH(D): O POS
ANTIBODY SCREEN: NEGATIVE
Unit division: 0
Unit division: 0

## 2017-04-05 LAB — BPAM RBC
BLOOD PRODUCT EXPIRATION DATE: 201901072359
BLOOD PRODUCT EXPIRATION DATE: 201901082359
ISSUE DATE / TIME: 201812131210
ISSUE DATE / TIME: 201812131210
UNIT TYPE AND RH: 5100
UNIT TYPE AND RH: 5100

## 2017-04-05 NOTE — Progress Notes (Signed)
Radiation Oncology         [id number] ________________________________  Initial Outpatient Consultation  Name: Joel [name] MRN: [id number]  Date: 04/05/2017  DOB: 1940/07/16  DZ:[id number], [name], MD  Wyatt Portela, MD   REFERRING PHYSICIAN: Wyatt Portela, MD  DIAGNOSIS:    ICD-10-CM   1. 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 male who presented to his PCP on 02/25/2017 with a two-month history of increasing right neck mass without any other symptoms.  Subsequently, the patient saw Dr. Benjamine Mola who performed laryngoscopy and biopsy of the mass.  Biopsy of the right neck soft tissue mass on 03/27/2017 revealed: Mantle Cell Lymphoma  Pertinent imaging thus far includes CT scans of the neck and chest performed on 02/27/2017 revealing neck and thoracic lymphadenopathy.   He was noted to have splenomegaly as well as upper abdominal adenopathy. A bone scan was also done on 04/03/2017 and showed no evidence of skeletal metastasis.  The patient saw Dr. Alen Blew on 04/04/2017 who referred him today to discuss potential radiotherapy options.  Swallowing issues, if any: He denies.  Weight Changes: He reports he has gained a little weight recently.  B symptoms: no fevers or night sweats  Pain status: He denies any pain.  Other symptoms: He had some fatigue which responded to a blood transfusion for anemia.  Tobacco history, if any: He reports he quit smoking about 31 years ago. He has quit using smokeless tobacco (chew).  ETOH abuse, if any: He denies. He does not drink alcohol.  Prior cancers, if any: Prostate cancer diagnosed in April 2017.  Able to breathe without distress.  PREVIOUS  RADIATION THERAPY: No  PAST MEDICAL HISTORY:  has a past medical history of Anemia, Aneurysm ([place]), Bilateral hydronephrosis, Mass of right side of neck, Pneumonia, and Urinary retention.    PAST SURGICAL HISTORY: Past Surgical History:  Procedure Laterality Date  . MASS BIOPSY Right 03/27/2017   Procedure: OPEN NECK MASS BIOPSY OF THE RIGHT NECK;  Surgeon: Leta Baptist, MD;  Location: [facility];  Service: ENT;  Laterality: Right;  . PROSTATE BIOPSY N/A 07/30/2015   Procedure: PROSTATE BIOPSY;  Surgeon: Irine Seal, MD;  Location: [facility];  Service: Urology;  Laterality: N/A;  . TRANSURETHRAL RESECTION OF PROSTATE N/A 07/30/2015   Procedure: TRANSURETHRAL RESECTION OF THE PROSTATE (TURP);  Surgeon: Irine Seal, MD;  Location: [facility];  Service: Urology;  Laterality: N/A;    FAMILY HISTORY: family history includes Dementia in his mother; Heart disease in his father; Lung cancer in his brother; Lupus in his sister.  SOCIAL HISTORY:  reports that he quit smoking about 31 years ago. He has quit using smokeless tobacco. His smokeless tobacco use included chew. He reports that he does not drink alcohol or use drugs.  ALLERGIES: Patient has no known allergies.  MEDICATIONS:  Current Outpatient Medications  Medication Sig Dispense Refill  . Ibrutinib (IMBRUVICA) 560 MG TABS Take 560 mg by mouth daily. Take with a full glass of water at approximately the same time each day. (Patient taking differently: Take 560 mg by mouth daily. Take with a full glass of water at approximately the same time each day.  He plans to pick it up today and begin taking it (04/05/17)) 28 tablet 0  . oxyCODONE-acetaminophen (ROXICET) 5-325 MG tablet Take 1 tablet by mouth every 4 (four) hours as needed for severe pain. (Patient not taking: Reported on 04/05/2017) 15 tablet 0   No current facility-administered medications for this encounter.     REVIEW OF SYSTEMS:  A 10+ POINT REVIEW OF SYSTEMS WAS OBTAINED including neurology,  dermatology, psychiatry, cardiac, respiratory, lymph, extremities, GI, GU, Musculoskeletal, constitutional,  HEENT.  All pertinent positives are noted in the HPI.  All others are negative.   PHYSICAL EXAM:  height is 5\' 6"  (1.676 m) and weight is 162 lb 6.4 oz (73.7 kg). His temperature is 98.3 F (36.8 C). His blood pressure is 147/76 (abnormal) and his pulse is 67. His oxygen saturation is 97%.   General: Alert and oriented, in no acute distress. HEENT: Head is normocephalic. Extraocular movements are intact. Oropharynx is clear. Neck: Neck is notable for a large neck mass which is palpable through the majority of the right neck and is multilobulated.  Heart: Regular in rate and rhythm with a soft systolic murmur. Chest: He has crackles at the bases of his lungs. Abdomen: Soft, nontender, nondistended, with no rigidity or guarding. Extremities: He has pitting edema in his lower extremities. Lymphatics: see Neck Exam Skin: No concerning lesions. Musculoskeletal: Symmetric strength and muscle tone throughout. Neurologic: Cranial nerves II through XII are grossly intact. No obvious focalities. Speech is fluent. Coordination is intact. Psychiatric: Judgment and insight are intact. Affect is appropriate.    ECOG = 1  0 - Asymptomatic (Fully active, able to carry on all predisease activities without restriction)  1 - Symptomatic but completely ambulatory (Restricted in physically strenuous activity but ambulatory and able to carry out work of a light or sedentary nature. For example, light housework, office work)  2 - Symptomatic, <50% in bed during the day (Ambulatory and capable of all self care but unable to carry out any work activities. Up and about more than 50% of waking hours)  3 - Symptomatic, >50% in bed, but not bedbound (Capable of only limited self-care, confined to bed or chair 50% or more of waking hours)  4 - Bedbound (Completely disabled. Cannot carry on any self-care.  Totally confined to bed or chair)  5 - Death   Eustace Pen MM, Creech RH, Tormey DC, et al. [phone]). "Toxicity and response criteria of the [facility]". [place] Oncol. 5 (6): 649-55   LABORATORY DATA:  Lab Results  Component Value Date   WBC 21.6 (H) 04/04/2017   HGB 5.4 (LL) 04/04/2017   HCT 17.8 (L) 04/04/2017   MCV 92.7 04/04/2017   PLT 88 (L) 04/04/2017   CMP     Component Value Date/Time   NA 141 04/04/2017 1030   K 4.4 04/04/2017 1030   CL 106 03/27/2017 0713   CO2 20 (L) 04/04/2017 1030   GLUCOSE 97 04/04/2017 1030   BUN 29.2 (H) 04/04/2017 1030   CREATININE 1.7 (H) 04/04/2017 1030   CALCIUM 8.8 04/04/2017 1030   PROT 8.0 04/04/2017 1030   ALBUMIN 3.2 (L) 04/04/2017 1030   AST 31 04/04/2017 1030   ALT 10 04/04/2017 1030   ALKPHOS 101 04/04/2017 1030   BILITOT 0.56 04/04/2017 1030   GFRNONAA 42 (L) 03/27/2017 0713   GFRNONAA 6 (L) 07/05/2015 1745   GFRAA 49 (L) 03/27/2017 0713   GFRAA 7 (L) 07/05/2015 1745  RADIOGRAPHY see above... Also the following. I reviewed his images: Nm Bone Scan Whole Body  Result Date: 04/03/2017 CLINICAL DATA:  Prostate cancer. New neck mass and lymphadenopathy consistent with lymphoma EXAM: NUCLEAR MEDICINE WHOLE BODY BONE SCAN TECHNIQUE: Whole body anterior and posterior images were obtained approximately 3 hours after intravenous injection of radiopharmaceutical. RADIOPHARMACEUTICALS:  Twenty-one mCi Technetium-74m MDP IV COMPARISON:  CT 04/03/2017, PET-CT 01/31/2016 FINDINGS: No abnormal accumulation of radiotracer within the axillary or appendicular skeleton to localize prostate cancer metastasis. Urine within the bladder obscures portion of the sacrum. IMPRESSION: No scintigraphic evidence skeletal metastasis. Electronically Signed   By: Suzy Bouchard M.D.   On: 04/03/2017 20:29      IMPRESSION/PLAN: This is a delightful patient with lymphoma, head and neck cancer. Also happens to have prostate  cancer.  PET could not be scheduled until 04/25/17 - staging pending. Today, I talked to the patient about the findings and work-up thus far. We discussed the patient's diagnosis of Mantle Cell Lymphoma and general treatment for this, highlighting the role of radiotherapy in the management. We discussed the available radiation techniques, and focused on the details of logistics and delivery.    Given that he is not felt to be a good candidate for aggressive systemic therapy up front for curative intent (per Dr Alen Blew), I do recommend palliative radiotherapy for this patient to his right neck which could eventually affect his airway and ulcerate through skin or further compromise vessels/nerves in the neck. He will be starting Imbruvica soon but is not a candidate for systemic chemotherapy at this time per Dr. Hazeline Junker note. The patient and his family understand that treatments will not be curative.   We discussed the risks, benefits, and side effects of radiotherapy. Side effects may include but not necessarily be limited to: fatigue, skin irritation, dysphagia, taste changes, salivary changes, airway obstruction, and mucositis. No guarantees of treatment were given. A consent form was signed and placed in the patient's medical record. The patient was encouraged to ask questions that I answered to the best of my ability.   Simulation (treatment planning) will take place in the near future. I anticipate 2-3 weeks of radiation therapy.  I will refer him to Dr. Mickeal Skinner. Given that the patient is at risk for stroke in light of his neck mass, I will ask Mont Dutton, RT to arrange a 3T MRI of his brain to follow up the edema seen on his CT scan. She will arrange for Dr. Renda Rolls appointment to be after the MRI. As a precaution, I advised the patient not to work during radiation therapy and to talk to Dr. Mickeal Skinner about precautions regarding operating machinery given his medical issues.They would also like to hear  about how to take precautions for potential CVAs. __________________________________________   Eppie Gibson, MD  This document serves as a record of services personally performed by Eppie Gibson, MD. It was created on her behalf by Rae Lips, a trained medical scribe. The creation of this record is based on the scribe's personal observations and the provider's statements to them. This document has been checked and approved by the attending provider.

## 2017-04-05 NOTE — Progress Notes (Signed)
Oncology Nurse Navigator Documentation  Met with patient during initial consult with Dr. Isidore Moos.  He was accompanied by his wife and dtr. 1. Further introduced myself as his Navigator, explained my role as a member of the Care Team.   2. Provided New Patient Information packet, discussed contents:  Contact information for physician(s), myself, other members of the Care Team.  Advance Directive information ([place] blue pamphlet with LCSW contact info) including [place] AD packet.  Fall Prevention Patient Safety Plan  Appointment Guideline  Financial Assistance Information sheet  [place] with highlight of [place]  SLP information sheet 3. Provided introductory explanation of radiation treatment including SIM planning and purpose of Aquaplast head and shoulder mask, showed them example.   4. I encouraged them to contact me with questions/concerns as treatments/procedures begin.  They verbalized understanding of information provided.    Gayleen Orem, RN, BSN, [place] Neck Oncology Nurse [place] at [place] [phone]

## 2017-04-08 NOTE — Progress Notes (Addendum)
Has armband been applied? Yes  Does patient have an allergy to IV contrast dye?: No   Has patient ever received premedication for IV contrast dye?: N/A  Does patient take metformin?: No  If patient does take metformin when was the last dose: N/A  Date of lab work: 04/04/17 BUN: 29.2 CR: 1.7 EGFR: 47  IV site: Left AC  Has IV site been added to flowsheet?  Yes  BP (!) 141/84   Pulse 74   Temp 98.5 F (36.9 C)   Resp 20   Wt 159 lb 6.4 oz (72.3 kg)   SpO2 99% Comment: room air  BMI 25.73 kg/m

## 2017-04-09 ENCOUNTER — Encounter: Payer: Self-pay | Admitting: Radiation Oncology

## 2017-04-09 ENCOUNTER — Other Ambulatory Visit: Payer: Self-pay | Admitting: Radiation Oncology

## 2017-04-09 ENCOUNTER — Encounter: Payer: Self-pay | Admitting: *Deleted

## 2017-04-09 ENCOUNTER — Ambulatory Visit
Admission: RE | Admit: 2017-04-09 | Discharge: 2017-04-09 | Disposition: A | Discharge status: Home / Self Care | Payer: Medicare HMO | Source: Ambulatory Visit | Attending: Radiation Oncology | Admitting: Radiation Oncology

## 2017-04-09 VITALS — BP 141/84 | HR 74 | Temp 98.5°F | Resp 20 | Wt 159.4 lb

## 2017-04-09 DIAGNOSIS — C8311 Mantle cell lymphoma, lymph nodes of head, face, and neck: Secondary | ICD-10-CM

## 2017-04-09 DIAGNOSIS — C8191 Hodgkin lymphoma, unspecified, lymph nodes of head, face, and neck: Secondary | ICD-10-CM

## 2017-04-09 DIAGNOSIS — I739 Peripheral vascular disease, unspecified: Secondary | ICD-10-CM

## 2017-04-09 DIAGNOSIS — R221 Localized swelling, mass and lump, neck: Secondary | ICD-10-CM

## 2017-04-09 DIAGNOSIS — Z510 Encounter for antineoplastic radiation therapy: Secondary | ICD-10-CM | POA: Diagnosis not present

## 2017-04-09 MED ORDER — SODIUM CHLORIDE 0.9% FLUSH
10.0000 mL | Freq: Once | INTRAVENOUS | Status: AC
  Administered 2017-04-09: 10 mL via INTRAVENOUS

## 2017-04-09 NOTE — Telephone Encounter (Signed)
Oral Oncology Patient Advocate Encounter  Prior Authorization for Joel [name] has been approved.    PA# [id number]  Effective dates: 04/05/2017 through 03/26/2018  Oral Oncology Clinic will continue to follow.   Joel [name]. Melynda Keller, [place] Patient [place] [phone] 04/09/2017 8:46 AM

## 2017-04-09 NOTE — Telephone Encounter (Signed)
Oral Chemotherapy Pharmacist Encounter   I spoke with patient for overview of: Imbruvica.   Pt is doing well. Counseled patient on administration, dosing, side effects, monitoring, drug-food interactions, safe handling, storage, and disposal.  Patient will take Imbruvica 560mg  tablets, 1 tablet (560mg ) by mouth once daily. Patient will take Imbruvica at approximately the same time each day with a full glass of water and maintain adequate hydration throughout the day. Patient knows to avoid grapefruit or grapefruit juice while on therapy with Imbruvica. Imbruvica start date: pending medication acquisition  Side effects include but not limited to: bruising, decreased blood counts, N/V, diarrhea, musculoskeletal pain, arthralgias, peripheral edema, and hemorrhage.    Discussed risk of bruising and bleeding with this medication in light of patient's pre-existing thrombocytopenia. Patient will alert the office about bleeding that does not cease after 10 minutes and any bruising that he may experience.  Reviewed with patient importance of keeping a medication schedule and plan for any missed doses.  Joel [name] voiced understanding and appreciation.   All questions answered. Medication reconciliation performed and medication/allergy list updated.  Oral Oncology Patient Advocate working with patient for copayment grant funding. Imbruvica will be ready for patient pick-up at the [facility] once patient's out of pocket expense is reduced.   Patient knows to call the office with questions or concerns. Oral Oncology Clinic will continue to follow.  Thank you,  Joel [name], PharmD, BCPS, BCOP 04/09/2017   2:04 PM Oral Oncology Clinic [phone]

## 2017-04-09 NOTE — Progress Notes (Signed)
Oncology Nurse Navigator Documentation  To provide support, encouragement and care continuity, met with Joel [name] during his CT SIM. He tolerated procedure without difficulty, denied questions concerns. I toured him to [facility], explained registration, arrival and preparation procedures. I encouraged him to call me with questions/concerns prior to 12/26 RT start. He voiced understanding of information provided.  Gayleen Orem, RN, BSN, [place] Neck Oncology Nurse [place] at [place] [phone]

## 2017-04-11 ENCOUNTER — Encounter: Payer: Self-pay | Admitting: *Deleted

## 2017-04-11 ENCOUNTER — Telehealth: Payer: Self-pay | Admitting: *Deleted

## 2017-04-11 ENCOUNTER — Other Ambulatory Visit: Payer: Self-pay | Admitting: Radiation Therapy

## 2017-04-11 DIAGNOSIS — C8591 Non-Hodgkin lymphoma, unspecified, lymph nodes of head, face, and neck: Secondary | ICD-10-CM

## 2017-04-11 NOTE — Telephone Encounter (Signed)
Spoke with wife and daughter and they do not think that patient needs to be seen on 04/12/17. Per dr Alen Blew, okay to re-schedule. Daughter instructed to call main number to cancel and re-schedule to a later date.

## 2017-04-11 NOTE — Telephone Encounter (Signed)
Oncology Nurse Navigator Documentation  Rec'd call from pt's dtr asking about need for neuro consult prior to start of RT next week b/c they have not rec'd call for appt.  I indicated I wd check with Dr. Isidore Moos and follow-up with her.  Gayleen Orem, RN, BSN, [place] Neck Oncology Nurse [place] at [place] [phone]

## 2017-04-12 ENCOUNTER — Other Ambulatory Visit (HOSPITAL_BASED_OUTPATIENT_CLINIC_OR_DEPARTMENT_OTHER): Payer: Medicare HMO

## 2017-04-12 ENCOUNTER — Other Ambulatory Visit: Payer: Self-pay | Admitting: Radiology

## 2017-04-12 ENCOUNTER — Telehealth: Payer: Self-pay | Admitting: Oncology

## 2017-04-12 ENCOUNTER — Telehealth: Payer: Self-pay | Admitting: Pharmacy Technician

## 2017-04-12 ENCOUNTER — Telehealth: Payer: Self-pay | Admitting: Medical Oncology

## 2017-04-12 ENCOUNTER — Telehealth: Payer: Self-pay | Admitting: *Deleted

## 2017-04-12 ENCOUNTER — Ambulatory Visit (HOSPITAL_BASED_OUTPATIENT_CLINIC_OR_DEPARTMENT_OTHER): Payer: Medicare HMO | Admitting: Oncology

## 2017-04-12 VITALS — BP 132/69 | HR 94 | Temp 99.0°F | Resp 18 | Ht 66.0 in | Wt 164.1 lb

## 2017-04-12 DIAGNOSIS — C61 Malignant neoplasm of prostate: Secondary | ICD-10-CM | POA: Insufficient documentation

## 2017-04-12 DIAGNOSIS — D630 Anemia in neoplastic disease: Secondary | ICD-10-CM | POA: Diagnosis not present

## 2017-04-12 DIAGNOSIS — C8318 Mantle cell lymphoma, lymph nodes of multiple sites: Secondary | ICD-10-CM

## 2017-04-12 DIAGNOSIS — C8311 Mantle cell lymphoma, lymph nodes of head, face, and neck: Secondary | ICD-10-CM

## 2017-04-12 LAB — CBC WITH DIFFERENTIAL/PLATELET
HCT: 22.3 % — ABNORMAL LOW (ref 38.4–49.9)
HEMOGLOBIN: 7.2 g/dL — AB (ref 13.0–17.1)
MCH: 29 pg (ref 27.2–33.4)
MCHC: 32.3 g/dL (ref 32.0–36.0)
MCV: 89.8 fL (ref 79.3–98.0)
Platelets: 79 10*3/uL — ABNORMAL LOW (ref 140–400)
RBC: 2.48 10*6/uL — AB (ref 4.20–5.82)
RDW: 21 % — AB (ref 11.0–14.6)
WBC: 19 10*3/uL — AB (ref 4.0–10.3)

## 2017-04-12 LAB — MANUAL DIFFERENTIAL
ALC: 6.8 10*3/uL — ABNORMAL HIGH (ref 0.9–3.3)
ANC (CHCC MAN DIFF): 3.8 10*3/uL (ref 1.5–6.5)
Band Neutrophils: 1 % (ref 0–10)
EOS: 1 % (ref 0–7)
LYMPH: 36 % (ref 14–49)
METAMYELOCYTES PCT: 3 % — AB (ref 0–0)
MONO: 6 % (ref 0–14)
NRBC: 8 % — AB (ref 0–0)
PLT EST: ADEQUATE
SEG: 16 % — ABNORMAL LOW (ref 38–77)

## 2017-04-12 MED FILL — IMBRUVICA 560 MG TAB: 560 | 28 days supply | Qty: 28 | Fill #0

## 2017-04-12 NOTE — Telephone Encounter (Signed)
Oncology Nurse Navigator Documentation  Called pt dtr, informed her Dr. Isidore Moos deems important to start RT 12/26 to prevent disease progression even though prior to MRI and appt with Dr. Mickeal Skinner.  She voiced understanding.  Gayleen Orem, RN, BSN, [place] Neck Oncology Nurse [place] at [place] [phone]

## 2017-04-12 NOTE — Progress Notes (Signed)
Hematology and Oncology Follow Up Visit  Joel [name] [id number] 1941/01/10 76 y.o. 04/12/2017 2:50 PM [name], MDSanford, [name], MD   Principle Diagnosis: 76 year old gentleman with the following issues:  1.  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  He presented with urinary retention and a PSA of 95.5.  He underwent a TURP procedure which showed a prostate cancer Gleason score 3+3 = 6. Fluciclovine scan in October 2017 which showed bulky periaortic and retroperitoneal lymph nodes consistent with prostate cancer.   2.  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  This was confirmed in December 2018.   Prior Therapy: He is status post excisional biopsy of a right neck mass completed on 03/27/2017.  Current therapy:   Imbruvica to start in the immediate future.  Other consideration to start palliative radiation therapy to the neck.  Interim History: Mr. Garlow presents today for a follow-up visit.  Since the last visit, he received 2 units of packed red cell transfusion and felt much better.  He continues to feel reasonably well at this time and attends to activities of daily living.  Continues to work and has not reported any new complaints.  He denies any chest pain or shortness of breath.  He denies any exertional dyspnea.    He was evaluated by Dr. Isidore Moos to start radiation therapy next week.  He denies any wheezing or hoarseness.  He denied any dysphasia or done aphasia.  He does not report any headaches, blurry vision, syncope or seizures.  He does not report any chest pain, palpitation, orthopnea or leg edema.  He does not report any cough, wheezing or hemoptysis.  He does not report any nausea, vomiting or abdominal pain.  He does not report any frequency urgency or hesitancy.  He does not report any hematuria or dysuria.   Remaining review of systems unremarkable.   Medications: I have reviewed the patient's current medications.  Current Outpatient Medications  Medication Sig Dispense Refill  . Ibrutinib (IMBRUVICA) 560 MG TABS Take 560 mg by mouth daily. Take with a full glass of water at approximately the same time each day. (Patient taking differently: Take 560 mg by mouth daily. Take with a full glass of water at approximately the same time each day. He plans to pick it up today and begin taking it (04/05/17)) 28 tablet 0  . oxyCODONE-acetaminophen (ROXICET) 5-325 MG tablet Take 1 tablet by mouth every 4 (four) hours as needed for severe pain. (Patient not taking: Reported on 04/05/2017) 15 tablet 0   No current facility-administered medications for this visit.      Allergies: No Known Allergies  Past Medical History, Surgical history, Social history, and Family History were reviewed and updated.  Physical Exam: Blood pressure 132/69, pulse 94, temperature 99 F (37.2 C), temperature source Oral, resp. rate 18, height _0  (1.676 m), weight 164 lb 1.6 oz (74.4 kg), SpO2 98 %. ECOG:1 General appearance: Comfortable appearing gentleman appeared without distress. Head: Normocephalic, without obvious abnormality without oral thrush. Neck: Large neck mass on the left side.  Lymph nodes: Cervical, supraclavicular, and axillary nodes normal. Heart:regular rate and rhythm, S1, S2 normal, no murmur, click, rub or gallop Lung:chest clear, no wheezing, rales, normal symmetric air entry.  Abdomin: soft, non-tender, without masses or organomegaly no shifting dullness or ascites. EXT:no erythema, induration, or nodules  Lab Results: Lab Results  Component Value Date   WBC 21.6 (H) 04/04/2017   HGB 5.4 (LL) 04/04/2017   HCT 17.8 (L) 04/04/2017   MCV 92.7 04/04/2017   PLT 88 (L) 04/04/2017     Chemistry      Component Value Date/Time   NA 141 04/04/2017 1030   K 4.4 04/04/2017 1030   CL 106 03/27/2017  0713   CO2 20 (L) 04/04/2017 1030   BUN 29.2 (H) 04/04/2017 1030   CREATININE 1.7 (H) 04/04/2017 1030      Component Value Date/Time   CALCIUM 8.8 04/04/2017 1030   ALKPHOS 101 04/04/2017 1030   AST 31 04/04/2017 1030   ALT 10 04/04/2017 1030   BILITOT 0.56 04/04/2017 1030      Impression and Plan:  76 year old gentleman with the following issues:  1. Mental cell lymphoma: He presented in November 2018 with neck mass without any other symptoms.  Imaging studies including CT scan of the neck as well as the chest showed neck and thoracic lymphadenopathy.  He also noted to have splenomegaly as well as upper abdominal adenopathy.  This is biopsy-proven to be mantle cell lymphoma on March 27, 2017.  He is not a candidate for curative chemotherapy as it will require high-dose chemotherapy and stem cell transplant.  In the setting of advanced prostate cancer as well it is reasonable to proceed with palliative approaches.   Imbruvica offers a good palliative option until his cytopenia corrects and multiagent systemic chemotherapy can be used as a secondary option.   2.  Prostate cancer diagnosed in April 2017: He presented with urinary retention and a PSA of 95.5.  Imaging obtained in October 2017 showed diffuse lymphadenopathy which suggest that his likely malignancy is prostate cancer only with no additional malignancy.  Obtaining tissue biopsy will certainly confirm that.  I will proceed with a Lupron with the next visit after his staging workup is complete including PET scan and a bone marrow biopsy.  3.  Anemia: Appears to be multifactorial related to malignancy and possible infiltration of prostate cancer into the bone marrow.  His peripheral smear today shows circulating lymphoma cells and likely this is related to his lymphoma.  He is status post 2 units of packed red cell transfusion and will repeat as needed.  4.  Enlarging neck mass: Appreciate the input from Dr. Isidore Moos and  he will start palliative radiation therapy next week.  5.  Follow-up: Will be 4 weeks to follow his progress.       Zola Button, MD 12/21/20182:50 PM

## 2017-04-12 NOTE — Telephone Encounter (Signed)
Gave avs and calendar for January  °

## 2017-04-12 NOTE — Telephone Encounter (Signed)
Oral Oncology Patient Advocate Encounter  Spoke with [name].  He is picking up his medication this evening (04/12/2017).  His initial copayment is $0 with copayment assistance from [place].    Fabio Asa. Melynda Keller, [place] Patient [place] [phone] 04/12/2017 4:15 PM

## 2017-04-12 NOTE — Telephone Encounter (Signed)
States he is returning a call . I told pt I do not see a note about a call to him.

## 2017-04-12 NOTE — Progress Notes (Signed)
  Radiation Oncology         [phone] ________________________________  Name: Joel [name] MRN: [id number]  Date: 04/09/2017  DOB: 1940/09/15  SIMULATION AND TREATMENT PLANNING NOTE  Outpatient  DIAGNOSIS:     ICD-10-CM   1. 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  Identity was confirmed.  All relevant records and images related to the planned course of therapy were reviewed.  The patient freely provided informed written consent to proceed with treatment after reviewing the details related to the planned course of therapy. The consent form was witnessed and verified by the simulation staff.    Then, the patient was set-up in a stable reproducible  supine position for radiation therapy with a Head and neck mask custom made for immobilization.  CT images were obtained.  Surface markings were placed.  The CT images were loaded into the planning software.    TREATMENT PLANNING NOTE: Treatment planning then occurred.  The radiation prescription was entered and confirmed.    A total of 4 medically necessary complex treatment devices were fabricated and supervised by me, in the form of 3 fields with MLCs to block esophagus, cord, oral cavity, parotid tissue; and a custom mask for immobilization. MORE FIELDS WITH MLCs MAY BE ADDED IN DOSIMETRY for dose homogeneity.  I have requested : 3D Simulation  I have requested a DVH of the following structures: esophagus, cord, oral cavity, parotid tissue, target volume.     The patient will receive 32.5 Gy in 13 fractions to the neck mass   -----------------------------------  Eppie Gibson, MD

## 2017-04-12 NOTE — Telephone Encounter (Signed)
Oral Oncology Patient Advocate Encounter  Was successful in securing patient a $ 5000 grant from [employer] [facility] [place] [facility]) to provide copayment coverage for his Imbruvica.  This will keep the out of pocket expense at $0.    I have left a message for the patient.    The billing information is as follows and has been shared with [place].   Member ID: [id number] Group ID: [id number] RxBin: [id number] Dates of Eligibility: 12/23/2016 through 03/22/2018  Joel [name]. Melynda Keller, [place] Patient [place] [phone] 04/12/2017 3:59 PM

## 2017-04-15 ENCOUNTER — Ambulatory Visit (HOSPITAL_COMMUNITY)
Admission: RE | Admit: 2017-04-15 | Discharge: 2017-04-15 | Disposition: A | Discharge status: Home / Self Care | Payer: Medicare HMO | Source: Ambulatory Visit | Attending: Oncology | Admitting: Oncology

## 2017-04-15 ENCOUNTER — Encounter (HOSPITAL_COMMUNITY): Payer: Self-pay

## 2017-04-15 DIAGNOSIS — C8318 Mantle cell lymphoma, lymph nodes of multiple sites: Secondary | ICD-10-CM | POA: Diagnosis present

## 2017-04-15 DIAGNOSIS — Z510 Encounter for antineoplastic radiation therapy: Secondary | ICD-10-CM | POA: Diagnosis not present

## 2017-04-15 LAB — CBC WITH DIFFERENTIAL/PLATELET
BASOS PCT: 0 %
Basophils Absolute: 0 10*3/uL (ref 0.0–0.1)
EOS PCT: 1 %
Eosinophils Absolute: 0.6 10*3/uL (ref 0.0–0.7)
HEMATOCRIT: 21.6 % — AB (ref 39.0–52.0)
HEMOGLOBIN: 7 g/dL — AB (ref 13.0–17.0)
Lymphocytes Relative: 41 %
Lymphs Abs: 25.4 10*3/uL — ABNORMAL HIGH (ref 0.7–4.0)
MCH: 29.3 pg (ref 26.0–34.0)
MCHC: 32.4 g/dL (ref 30.0–36.0)
MCV: 90.4 fL (ref 78.0–100.0)
MONOS PCT: 3 %
Monocytes Absolute: 1.9 10*3/uL — ABNORMAL HIGH (ref 0.1–1.0)
NEUTROS ABS: 2.5 10*3/uL (ref 1.7–7.7)
Neutrophils Relative %: 4 %
Other: 51 %
Platelets: 88 10*3/uL — ABNORMAL LOW (ref 150–400)
RBC: 2.39 MIL/uL — AB (ref 4.22–5.81)
RDW: 20 % — ABNORMAL HIGH (ref 11.5–15.5)
WBC: 61.9 10*3/uL — AB (ref 4.0–10.5)

## 2017-04-15 LAB — PROTIME-INR
INR: 1.14
PROTHROMBIN TIME: 14.6 s (ref 11.4–15.2)

## 2017-04-15 LAB — APTT: APTT: 30 s (ref 24–36)

## 2017-04-15 MED ORDER — FENTANYL CITRATE (PF) 100 MCG/2ML IJ SOLN
INTRAMUSCULAR | Status: AC | PRN
  Administered 2017-04-15: 50 ug via INTRAVENOUS

## 2017-04-15 MED ORDER — MIDAZOLAM HCL 2 MG/2ML IJ SOLN
INTRAMUSCULAR | Status: AC
  Filled 2017-04-15: qty 4

## 2017-04-15 MED ORDER — MIDAZOLAM HCL 2 MG/2ML IJ SOLN
INTRAMUSCULAR | Status: AC | PRN
  Administered 2017-04-15 (×2): 1 mg via INTRAVENOUS

## 2017-04-15 MED ORDER — LIDOCAINE HCL 1 % IJ SOLN
INTRAMUSCULAR | Status: AC | PRN
  Administered 2017-04-15: 10 mL

## 2017-04-15 MED ORDER — SODIUM CHLORIDE 0.9 % IV SOLN
INTRAVENOUS | Status: DC
  Administered 2017-04-15: 08:00:00 via INTRAVENOUS

## 2017-04-15 MED ORDER — FENTANYL CITRATE (PF) 100 MCG/2ML IJ SOLN
INTRAMUSCULAR | Status: AC
  Filled 2017-04-15: qty 4

## 2017-04-15 NOTE — Procedures (Signed)
Pancytopenia, lymphoma  S/p CT BM ASP AND CORE BX  No comp Stable EBL 0 Full report in pacs

## 2017-04-15 NOTE — Progress Notes (Signed)
CRITICAL VALUE ALERT  Critical Value:  WBC 61.9  Date & Time Notied:  04/15/17 08:27  Provider Notified: Warnell Bureau, PA  Orders Received/Actions taken: No orders given

## 2017-04-15 NOTE — Consult Note (Signed)
Chief Complaint: Patient was seen in consultation today for CT-guided bone marrow biopsy  Referring Physician(s): Joel [name]  Supervising Physician: Joel [name]  Patient Status: [name]  History of Present Illness: Joel [name] is a 76 y.o. 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  Past Medical History:  Diagnosis Date  . Anemia   . Aneurysm (Joel [name])    in 1987, brain aneurysm  . Bilateral hydronephrosis   . Mass of right side of neck   . Pneumonia    hx of   . Urinary retention     Past Surgical History:  Procedure Laterality Date  . MASS BIOPSY Right 03/27/2017   Procedure: OPEN NECK MASS BIOPSY OF THE RIGHT NECK;  Surgeon: Joel Baptist, MD;  Location: [name];  Service: ENT;  Laterality: Right;  . PROSTATE BIOPSY N/A 07/30/2015   Procedure: PROSTATE BIOPSY;  Surgeon: Joel Seal, MD;  Location: [name];  Service: Urology;  Laterality: N/A;  . TRANSURETHRAL RESECTION OF PROSTATE N/A 07/30/2015   Procedure: TRANSURETHRAL RESECTION OF THE PROSTATE (TURP);  Surgeon: Joel Seal, MD;  Location: [name];  Service: Urology;  Laterality: N/A;    Allergies: Patient has no known allergies.  Medications: Prior to Admission medications   Medication Sig Start Date End Date Taking? Authorizing Provider  Ibrutinib (IMBRUVICA) 560 MG TABS Take 560 mg by mouth daily. Take with a full glass of water at approximately the same time each day. Patient taking differently: Take 560 mg by mouth daily. Take with a full glass of water at approximately the same time each day. He plans to pick it up today and begin taking it (04/05/17) 04/04/17  Yes Shadad, Mathis Dad, MD  oxyCODONE-acetaminophen (ROXICET) 5-325 MG tablet Take 1 tablet by mouth every 4 (four) hours as needed for severe pain. Patient not taking: Reported on 04/05/2017 03/27/17    Joel Baptist, MD     Family History  Problem Relation Age of Onset  . Dementia Mother   . Heart disease Father   . Lung cancer Brother   . Lupus Sister     Social History   Socioeconomic History  . Marital status: Married    Spouse name: None  . Number of children: None  . Years of education: None  . Highest education level: None  Social Needs  . Financial resource strain: None  . Food insecurity - worry: None  . Food insecurity - inability: None  . Transportation needs - medical: None  . Transportation needs - non-medical: None  Occupational History  . None  Tobacco Use  . Smoking status: Former Smoker    Last attempt to quit: 04/28/1985    Years since quitting: 31.9  . Smokeless tobacco: Former Systems developer    Types: Chew  . Tobacco comment: quit 30-40 yrs. aog  Substance and Sexual Activity  . Alcohol use: No    Alcohol/week: 0.0 oz  . Drug use: No  . Sexual activity: None  Other Topics Concern  . None  Social History Narrative  . None      Review of Systems denies fever, headache, chest pain, dyspnea, cough, abdominal/back pain, nausea, vomiting or bleeding.  Vital Signs: BP (!) 161/87 (BP Location: Right Arm)   Pulse 69   Temp 98.2 F (36.8 C) (Oral)   Resp 16   SpO2 100%   Physical  Exam awake, alert.  Chest clear to auscultation bilaterally.  Heart with regular rate and rhythm.  Abdomen soft, positive bowel sounds, nontender.  Trace bilateral lower extremity edema; large NT right neck  mass  Imaging: Nm Bone Scan Whole Body  Result Date: 04/03/2017 CLINICAL DATA:  Prostate cancer. New neck mass and lymphadenopathy consistent with lymphoma EXAM: NUCLEAR MEDICINE WHOLE BODY BONE SCAN TECHNIQUE: Whole body anterior and posterior images were obtained approximately 3 hours after intravenous injection of radiopharmaceutical. RADIOPHARMACEUTICALS:  Twenty-one mCi Technetium-44mMDP IV COMPARISON:  CT 04/03/2017, PET-CT 01/31/2016 FINDINGS: No abnormal accumulation of  radiotracer within the axillary or appendicular skeleton to localize prostate cancer metastasis. Urine within the bladder obscures portion of the sacrum. IMPRESSION: No scintigraphic evidence skeletal metastasis. Electronically Signed   By: Joel BouchardM.D.   On: 04/03/2017 20:29    Labs:  CBC: Recent Labs    03/22/17 1237 03/27/17 0713 04/04/17 1030 04/12/17 1423  WBC 16.1* 14.7* 21.6* 19.0*  HGB 6.1* 5.6* 5.4* 7.2*  HCT 19.4* 17.5* 17.8* 22.3*  PLT 93* 77* 88* 79*    COAGS: Recent Labs    04/15/17 0740  INR 1.14  APTT 30    BMP: Recent Labs    02/25/17 1442 03/27/17 0713 04/04/17 1030  NA 141 138 141  K 4.5 4.0 4.4  CL 106 106  --   CO2 20 20* 20*  GLUCOSE 93 89 97  BUN 29* 22* 29.2*  CALCIUM 9.0 8.8* 8.8  CREATININE 1.50* 1.55* 1.7*  GFRNONAA 46* 42*  --   GFRAA 53* 49*  --     LIVER FUNCTION TESTS: Recent Labs    02/25/17 1442 04/04/17 1030  BILITOT 0.3 0.56  AST 27 31  ALT 12 10  ALKPHOS 108 101  PROT 7.5 8.0  ALBUMIN 3.7 3.2*    TUMOR MARKERS: No results for input(s): AFPTM, CEA, CA199, CHROMGRNA in the last 8760 hours.  Assessment and Plan: 76y.o. 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 All of the patient's questions were answered, patient is agreeable to proceed. Consent signed and in chart.     Thank you for this interesting consult.  I greatly enjoyed meeting Joel Fujiwaraand look forward to participating in their care.  A copy of this report was sent to the requesting provider on this date.  Electronically Signed: D. KRowe Robert PA-C 04/15/2017, 8:20 AM   I spent a total of  20 minutes   in face  to face in clinical consultation, greater than 50% of which was counseling/coordinating care for CT guided bone marrow biopsy

## 2017-04-15 NOTE — Sedation Documentation (Signed)
Patient is resting comfortably. 

## 2017-04-15 NOTE — Discharge Instructions (Addendum)
Bone Marrow Aspiration and Bone Marrow Biopsy, Adult, Care After   Please keep dressing on for the next 24 hours. You can shower/bathe in 24 hours as well.   Have a Merry Christmas!   This sheet gives you information about how to care for yourself after your procedure. Your health care provider may also give you more specific instructions. If you have problems or questions, contact your health care provider. What can I expect after the procedure? After the procedure, it is common to have:  Mild pain and tenderness.  Swelling.  Bruising.  Follow these instructions at home:  Take over-the-counter or prescription medicines only as told by your health care provider.  Do not take baths, swim, or use a hot tub until your health care provider approves. Ask if you can take a shower or have a sponge bath.  Follow instructions from your health care provider about how to take care of the puncture site. Make sure you: ? Wash your hands with soap and water before you change your bandage (dressing). If soap and water are not available, use hand sanitizer. ? Change your dressing as told by your health care provider.  Check your puncture siteevery day for signs of infection. Check for: ? More redness, swelling, or pain. ? More fluid or blood. ? Warmth. ? Pus or a bad smell.  Return to your normal activities as told by your health care provider. Ask your health care provider what activities are safe for you.  Do not drive for 24 hours if you were given a medicine to help you relax (sedative).  Keep all follow-up visits as told by your health care provider. This is important. Contact a health care provider if:  You have more redness, swelling, or pain around the puncture site.  You have more fluid or blood coming from the puncture site.  Your puncture site feels warm to the touch.  You have pus or a bad smell coming from the puncture site.  You have a fever.  Your pain is not  controlled with medicine. This information is not intended to replace advice given to you by your health care provider. Make sure you discuss any questions you have with your health care provider. Document Released: 10/27/2004 Document Revised: 10/28/2015 Document Reviewed: 09/21/2015 Elsevier Interactive Patient Education  2018 [place]. Moderate Conscious Sedation, Adult, Care After These instructions provide you with information about caring for yourself after your procedure. Your health care provider may also give you more specific instructions. Your treatment has been planned according to current medical practices, but problems sometimes occur. Call your health care provider if you have any problems or questions after your procedure. What can I expect after the procedure? After your procedure, it is common:  To feel sleepy for several hours.  To feel clumsy and have poor balance for several hours.  To have poor judgment for several hours.  To vomit if you eat too soon.  Follow these instructions at home: For at least 24 hours after the procedure:   Do not: ? Participate in activities where you could fall or become injured. ? Drive. ? Use heavy machinery. ? Drink alcohol. ? Take sleeping pills or medicines that cause drowsiness. ? Make important decisions or sign legal documents. ? Take care of children on your own.  Rest. Eating and drinking  Follow the diet recommended by your health care provider.  If you vomit: ? Drink water, juice, or soup when you can drink without  vomiting. ? Make sure you have little or no nausea before eating solid foods. General instructions  Have a responsible adult stay with you until you are awake and alert.  Take over-the-counter and prescription medicines only as told by your health care provider.  If you smoke, do not smoke without supervision.  Keep all follow-up visits as told by your health care provider. This is  important. Contact a health care provider if:  You keep feeling nauseous or you keep vomiting.  You feel light-headed.  You develop a rash.  You have a fever. Get help right away if:  You have trouble breathing. This information is not intended to replace advice given to you by your health care provider. Make sure you discuss any questions you have with your health care provider. Document Released: 01/28/2013 Document Revised: 09/12/2015 Document Reviewed: 07/30/2015 Elsevier Interactive Patient Education  [employer].

## 2017-04-17 ENCOUNTER — Ambulatory Visit
Admission: RE | Admit: 2017-04-17 | Discharge: 2017-04-17 | Disposition: A | Discharge status: Home / Self Care | Payer: Medicare HMO | Source: Ambulatory Visit | Attending: Radiation Oncology | Admitting: Radiation Oncology

## 2017-04-17 DIAGNOSIS — Z510 Encounter for antineoplastic radiation therapy: Secondary | ICD-10-CM | POA: Diagnosis not present

## 2017-04-18 ENCOUNTER — Ambulatory Visit
Admission: RE | Admit: 2017-04-18 | Discharge: 2017-04-18 | Disposition: A | Discharge status: Home / Self Care | Payer: Medicare HMO | Source: Ambulatory Visit | Attending: Radiation Oncology | Admitting: Radiation Oncology

## 2017-04-18 DIAGNOSIS — Z510 Encounter for antineoplastic radiation therapy: Secondary | ICD-10-CM | POA: Diagnosis not present

## 2017-04-18 LAB — MULTIPLE MYELOMA PANEL, SERUM
ALBUMIN/GLOB SERPL: 0.7 (ref 0.7–1.7)
Albumin SerPl Elph-Mcnc: 3 g/dL (ref 2.9–4.4)
Alpha 1: 0.3 g/dL (ref 0.0–0.4)
Alpha2 Glob SerPl Elph-Mcnc: 0.8 g/dL (ref 0.4–1.0)
B-Globulin SerPl Elph-Mcnc: 1 g/dL (ref 0.7–1.3)
Gamma Glob SerPl Elph-Mcnc: 2.6 g/dL — ABNORMAL HIGH (ref 0.4–1.8)
Globulin, Total: 4.7 g/dL — ABNORMAL HIGH (ref 2.2–3.9)
IGA/IMMUNOGLOBULIN A, SERUM: 207 mg/dL (ref 61–437)
IGM (IMMUNOGLOBIN M), SRM: 33 mg/dL (ref 15–143)
TOTAL PROTEIN: 7.7 g/dL (ref 6.0–8.5)

## 2017-04-19 ENCOUNTER — Ambulatory Visit
Admission: RE | Admit: 2017-04-19 | Discharge: 2017-04-19 | Disposition: A | Discharge status: Home / Self Care | Payer: Medicare HMO | Source: Ambulatory Visit | Attending: Radiation Oncology | Admitting: Radiation Oncology

## 2017-04-19 DIAGNOSIS — Z510 Encounter for antineoplastic radiation therapy: Secondary | ICD-10-CM | POA: Diagnosis not present

## 2017-04-20 ENCOUNTER — Ambulatory Visit
Admission: RE | Admit: 2017-04-20 | Discharge: 2017-04-20 | Disposition: A | Discharge status: Home / Self Care | Payer: Medicare HMO | Source: Ambulatory Visit | Attending: Radiation Oncology | Admitting: Radiation Oncology

## 2017-04-20 DIAGNOSIS — C8591 Non-Hodgkin lymphoma, unspecified, lymph nodes of head, face, and neck: Secondary | ICD-10-CM

## 2017-04-20 MED ORDER — GADOBENATE DIMEGLUMINE 529 MG/ML IV SOLN
15.0000 mL | Freq: Once | INTRAVENOUS | Status: AC | PRN
  Administered 2017-04-20: 15 mL via INTRAVENOUS

## 2017-04-22 ENCOUNTER — Ambulatory Visit: Admission: RE | Admit: 2017-04-22 | Payer: Medicare HMO | Source: Ambulatory Visit

## 2017-04-22 ENCOUNTER — Telehealth: Payer: Self-pay | Admitting: Internal Medicine

## 2017-04-22 ENCOUNTER — Ambulatory Visit
Admission: RE | Admit: 2017-04-22 | Discharge: 2017-04-22 | Disposition: A | Discharge status: Home / Self Care | Payer: Medicare HMO | Source: Ambulatory Visit | Attending: Radiation Oncology | Admitting: Radiation Oncology

## 2017-04-22 ENCOUNTER — Ambulatory Visit (HOSPITAL_BASED_OUTPATIENT_CLINIC_OR_DEPARTMENT_OTHER): Payer: Medicare HMO | Admitting: Internal Medicine

## 2017-04-22 DIAGNOSIS — C8318 Mantle cell lymphoma, lymph nodes of multiple sites: Secondary | ICD-10-CM | POA: Diagnosis not present

## 2017-04-22 DIAGNOSIS — C61 Malignant neoplasm of prostate: Secondary | ICD-10-CM | POA: Diagnosis not present

## 2017-04-22 DIAGNOSIS — I639 Cerebral infarction, unspecified: Secondary | ICD-10-CM

## 2017-04-22 DIAGNOSIS — Z510 Encounter for antineoplastic radiation therapy: Secondary | ICD-10-CM | POA: Diagnosis not present

## 2017-04-22 NOTE — Progress Notes (Signed)
[facility] [place], [place] [address] [phone]   New Patient Evaluation  Date of Service: 04/22/17 Patient Name: Joel [name] Patient MRN: [id number] Patient DOB: 1940-08-01 Provider: Ventura Sellers, MD  Identifying Statement:  Bo Teicher is a 76 y.o. male with Cerebrovascular accident (CVA), unspecified mechanism ([place])  who presents for initial consultation and evaluation regarding cancer associated neurologic deficits.    Referring Provider: [name], MD [place], [place] [address]  Primary Cancer: Mantle Cell Lymphoma  Oncologic History:  1.  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  He presented with urinary retention and a PSA of 95.5.  He underwent a TURP procedure which showed a prostate cancer Gleason score 3+3 = 6. Fluciclovinescan in October 2017 which showed bulky periaortic and retroperitoneal lymph nodes consistent with prostate cancer.   2.  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  This was confirmed in December 2018.  He is status post excisional biopsy of a right neck mass completed on 03/27/2017.  Started Imbruvica 12/18  History of Present Illness: The patient's records from the referring physician were obtained and reviewed and the patient interviewed to confirm this HPI.  Cyndy Freeze presented as referral from radiation oncology due to concern for neurovascular risk with neck radiation.  His recent brain MRI demonstrated several old infarcts, as well as one new/subacute lesion.  He describes good functional status, he has continued to work full time despite his recent medical setbacks and neck mass.  He denies seizures, headaches.  Medications: Current Outpatient Medications on File Prior to Visit  Medication Sig Dispense  Refill  . Ibrutinib (IMBRUVICA) 560 MG TABS Take 560 mg by mouth daily. Take with a full glass of water at approximately the same time each day. (Patient taking differently: Take 560 mg by mouth daily. Take with a full glass of water at approximately the same time each day. He plans to pick it up today and begin taking it (04/05/17)) 28 tablet 0  . oxyCODONE-acetaminophen (ROXICET) 5-325 MG tablet Take 1 tablet by mouth every 4 (four) hours as needed for severe pain. (Patient not taking: Reported on 04/05/2017) 15 tablet 0   No current facility-administered medications on file prior to visit.     Allergies: No Known Allergies Past Medical History:  Past Medical History:  Diagnosis Date  . Anemia   . Aneurysm ([place])    in 1987, brain aneurysm  . Bilateral hydronephrosis   . Mass of right side of neck   . Pneumonia    hx of   . Urinary retention    Past Surgical History:  Past Surgical History:  Procedure Laterality Date  . MASS BIOPSY Right 03/27/2017   Procedure: OPEN NECK MASS BIOPSY OF THE RIGHT NECK;  Surgeon: Leta Baptist, MD;  Location: [facility];  Service: ENT;  Laterality: Right;  . PROSTATE BIOPSY N/A 07/30/2015   Procedure: PROSTATE BIOPSY;  Surgeon: Irine Seal, MD;  Location: [facility];  Service: Urology;  Laterality: N/A;  . TRANSURETHRAL RESECTION OF PROSTATE N/A 07/30/2015   Procedure: TRANSURETHRAL RESECTION OF THE PROSTATE (TURP);  Surgeon: Irine Seal, MD;  Location: [facility];  Service: Urology;  Laterality: N/A;   Social History:  Social History   Socioeconomic History  . Marital status: Married    Spouse  name: Not on file  . Number of children: Not on file  . Years of education: Not on file  . Highest education level: Not on file  Social Needs  . Financial resource strain: Not on file  . Food insecurity - worry: Not on file  . Food insecurity - inability: Not on file  . Transportation needs - medical: Not on file  . Transportation needs - non-medical: Not on file    Occupational History  . Not on file  Tobacco Use  . Smoking status: Former Smoker    Last attempt to quit: 04/28/1985    Years since quitting: 32.0  . Smokeless tobacco: Former Systems developer    Types: Chew  . Tobacco comment: quit 30-40 yrs. aog  Substance and Sexual Activity  . Alcohol use: No    Alcohol/week: 0.0 oz  . Drug use: No  . Sexual activity: Not on file  Other Topics Concern  . Not on file  Social History Narrative  . Not on file   Family History:  Family History  Problem Relation Age of Onset  . Dementia Mother   . Heart disease Father   . Lung cancer Brother   . Lupus Sister     Review of Systems: Constitutional: Denies fevers, chills or abnormal weight loss Eyes: Denies blurriness of vision Ears, nose, mouth, throat, and face: Denies mucositis or sore throat Respiratory: Denies cough, dyspnea or wheezes Cardiovascular: Denies palpitation, chest discomfort or lower extremity swelling Gastrointestinal:  Denies nausea, constipation, diarrhea GU: Denies dysuria or incontinence Skin: Denies abnormal skin rashes Neurological: Per HPI Musculoskeletal: Denies joint pain, back or neck discomfort. No decrease in ROM Behavioral/Psych: Denies anxiety, disturbance in thought content, and mood instability   Physical Exam: Vitals:   04/22/17 1053  BP: (!) 134/52  Pulse: 76  Resp: 20  Temp: 97.9 F (36.6 C)  SpO2: 100%   KPS: 80. General: Alert, cooperative, pleasant, in no acute distress Head: Normal EENT: Soft tissue mass right neck.  Lungs: Resp effort normal Cardiac: Regular rate and rhythm Abdomen: Soft, non-distended abdomen Skin: No rashes cyanosis or petechiae. Extremities: No clubbing or edema  Neurologic Exam: Mental Status: Awake, alert, attentive to examiner. Oriented to self and environment. Language is fluent with intact comprehension.  Cranial Nerves: Visual acuity is grossly normal. Visual fields are full. Extra-ocular movements intact. No ptosis.  Face is symmetric, tongue midline. Motor: Tone and bulk are normal. Power is full in both arms and legs. Reflexes are symmetric, no pathologic reflexes present. Intact finger to nose bilaterally Sensory: Intact to light touch and temperature Gait: Normal and tandem gait is normal.   Labs: I have reviewed the data as listed    Component Value Date/Time   NA 141 04/04/2017 1030   K 4.4 04/04/2017 1030   CL 106 03/27/2017 0713   CO2 20 (L) 04/04/2017 1030   GLUCOSE 97 04/04/2017 1030   BUN 29.2 (H) 04/04/2017 1030   CREATININE 1.7 (H) 04/04/2017 1030   CALCIUM 8.8 04/04/2017 1030   PROT 7.7 04/12/2017 1423   PROT 8.0 04/04/2017 1030   ALBUMIN 3.2 (L) 04/04/2017 1030   AST 31 04/04/2017 1030   ALT 10 04/04/2017 1030   ALKPHOS 101 04/04/2017 1030   BILITOT 0.56 04/04/2017 1030   GFRNONAA 42 (L) 03/27/2017 0713   GFRNONAA 6 (L) 07/05/2015 1745   GFRAA 49 (L) 03/27/2017 0713   GFRAA 7 (L) 07/05/2015 1745   Lab Results  Component Value Date  WBC 61.9 (HH) 04/15/2017   NEUTROABS 2.5 04/15/2017   HGB 7.0 (L) 04/15/2017   HCT 21.6 (L) 04/15/2017   MCV 90.4 04/15/2017   PLT 88 (L) 04/15/2017    Imaging:  [facility] Clinician Interpretation: I have personally reviewed the radiological images as listed.  My interpretation, in the context of the patient's clinical presentation, is multiple infarcts, chronic and subacute  CLINICAL DATA:  76 y/o  M; malignant lymphoma.  SRS protocol.  EXAM: MRI HEAD WITHOUT AND WITH CONTRAST  TECHNIQUE: Multiplanar, multiecho pulse sequences of the brain and surrounding structures were obtained without and with intravenous contrast.  CONTRAST:  2mL MULTIHANCE GADOBENATE DIMEGLUMINE 529 MG/ML IV SOLN  COMPARISON:  02/27/2017 CT head  FINDINGS: Brain: Subcentimeter left temporal occipital periventricular focus of reduced diffusion, likely small vessel acute/early subacute infarction. Right temporal occipital hemosiderin  stained encephalomalacia, likely chronic infarction. Small chronic lacunar infarcts in bilateral thalamus. Few additional scattered punctate foci of susceptibility hypointensity likely represents hemosiderin deposition of chronic microhemorrhage. Moderate chronic microvascular ischemic changes and parenchymal volume loss of the brain. No abnormal enhancement of the brain.  Vascular: Normal flow voids.  Skull and upper cervical spine: Normal marrow signal.  Sinuses/Orbits: Mild diffuse paranasal sinus mucosal thickening. Trace bilateral mastoid effusions. Orbits are unremarkable.  Other: None.  IMPRESSION: 1. No evidence of intracranial lymphoma. 2. 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 Moderate chronic microvascular ischemic changes and moderate parenchymal volume loss of the brain.   Electronically Signed   By: Kristine Garbe M.D.   On: 04/21/2017 04:38  Assessment/Plan Cerebrovascular accident (CVA), unspecified mechanism [name])  Mr. Penrose has parenchymal imaging that demonstrates several old infarcts, chronic periventricular white matter changes, as well as a tiny focus of restricted diffusion likely consistent with subacute stroke.  The mechanism is likely small vessel based on size, location and appearance.    He is a poor candidate for anti-platelet therapy at this time given his progressive cytopenias.  Recent lipid evaluation did not demonstrate hypercholesterolemia, although this should be repeated by his PCP.  His blood pressure is controlled at this time.  His best secondary prevention at this time is diet and exercise, which we counseled him on extensively.  He is a former smoker.   Additionally, we provided counseling on early recognition of stroke symptoms based on the FAST method.  He is at increased risk of further strokes because of his established co-morbidities, as well as potential delayed effects on  right common carotid artery from upcoming radiation.  As we explained, if he seeks urgent attention for new neurologic symptoms, he may be able to ward off permanent disability if acute treatments are deemed safe.  We spent twenty additional minutes teaching regarding the natural history, biology, and historical experience in the treatment of neurologic complications of cancer. We also provided teaching sheets for the patient to take home as an additional resource.  We appreciate the opportunity to participate in the care of Rice Walsh.  He should return to clinic in 4 months for further evaluation.  All questions were answered. The patient knows to call the clinic with any problems, questions or concerns. No barriers to learning were detected.  The total time spent in the encounter was 60 minutes and more than 50% was on counseling and review of test results   Ventura Sellers, MD Medical Director of Neuro-Oncology [facility] at [place] 04/22/17 10:46 AM

## 2017-04-22 NOTE — Telephone Encounter (Signed)
Left message for patient re April appointment with Dr.  Mauri Reading. Also confirmed January appointment with Dr. Alen Blew. Schedule mailed.

## 2017-04-24 ENCOUNTER — Ambulatory Visit
Admission: RE | Admit: 2017-04-24 | Discharge: 2017-04-24 | Disposition: A | Discharge status: Home / Self Care | Payer: Medicare HMO | Source: Ambulatory Visit | Attending: Radiation Oncology | Admitting: Radiation Oncology

## 2017-04-24 DIAGNOSIS — Z510 Encounter for antineoplastic radiation therapy: Secondary | ICD-10-CM | POA: Diagnosis not present

## 2017-04-25 ENCOUNTER — Ambulatory Visit (HOSPITAL_COMMUNITY)
Admission: RE | Admit: 2017-04-25 | Discharge: 2017-04-25 | Disposition: A | Discharge status: Home / Self Care | Payer: Medicare HMO | Source: Ambulatory Visit | Attending: Oncology | Admitting: Oncology

## 2017-04-25 ENCOUNTER — Ambulatory Visit
Admission: RE | Admit: 2017-04-25 | Discharge: 2017-04-25 | Disposition: A | Discharge status: Home / Self Care | Payer: Medicare HMO | Source: Ambulatory Visit | Attending: Radiation Oncology | Admitting: Radiation Oncology

## 2017-04-25 DIAGNOSIS — R590 Localized enlarged lymph nodes: Secondary | ICD-10-CM | POA: Insufficient documentation

## 2017-04-25 DIAGNOSIS — C8318 Mantle cell lymphoma, lymph nodes of multiple sites: Secondary | ICD-10-CM

## 2017-04-25 DIAGNOSIS — R918 Other nonspecific abnormal finding of lung field: Secondary | ICD-10-CM | POA: Diagnosis not present

## 2017-04-25 DIAGNOSIS — Z510 Encounter for antineoplastic radiation therapy: Secondary | ICD-10-CM | POA: Diagnosis not present

## 2017-04-25 LAB — GLUCOSE, CAPILLARY: Glucose-Capillary: 94 mg/dL (ref 65–99)

## 2017-04-25 MED ORDER — FLUDEOXYGLUCOSE F - 18 (FDG) INJECTION
8.2400 | Freq: Once | INTRAVENOUS | Status: AC | PRN
  Administered 2017-04-25: 8.24 via INTRAVENOUS

## 2017-04-26 ENCOUNTER — Ambulatory Visit
Admission: RE | Admit: 2017-04-26 | Discharge: 2017-04-26 | Disposition: A | Discharge status: Home / Self Care | Payer: Medicare HMO | Source: Ambulatory Visit | Attending: Radiation Oncology | Admitting: Radiation Oncology

## 2017-04-26 DIAGNOSIS — Z510 Encounter for antineoplastic radiation therapy: Secondary | ICD-10-CM | POA: Diagnosis not present

## 2017-04-26 DIAGNOSIS — C8311 Mantle cell lymphoma, lymph nodes of head, face, and neck: Secondary | ICD-10-CM

## 2017-04-26 MED ORDER — SODIUM CHLORIDE 0.9% FLUSH
10.0000 mL | Freq: Once | INTRAVENOUS | Status: AC
  Administered 2017-04-26: 10 mL via INTRAVENOUS

## 2017-04-26 NOTE — Progress Notes (Signed)
error 

## 2017-04-26 NOTE — Progress Notes (Signed)
Has armband been applied?  Yes  Does patient have an allergy to IV contrast dye?: No   Has patient ever received premedication for IV contrast dye?: No  Does patient take metformin?: No  If patient does take metformin when was the last dose: N/A  Date of lab work: 04/04/17 BUN: 29.2 CR: 1.7 EGFR: 47  IV site: Left AC  Has IV site been added to flowsheet?  Yes

## 2017-04-29 ENCOUNTER — Ambulatory Visit: Payer: Medicare HMO

## 2017-04-29 ENCOUNTER — Telehealth: Payer: Self-pay

## 2017-04-29 DIAGNOSIS — Z510 Encounter for antineoplastic radiation therapy: Secondary | ICD-10-CM | POA: Diagnosis not present

## 2017-04-29 NOTE — Telephone Encounter (Signed)
At the request of Dr. Isidore Moos I called Mr. Tillman today regarding his radiation appointments. I spoke to Mr. Clute' wife Sallie. I informed her that we are cancelling Mr. Roye' radiation appointment today due to great response from his radiation. He will receive a new radiation plan and a new mask that will be ready to start tomorrow. She voiced her understanding and is aware that Mr. Weinkauf will come after his treatment tomorrow to speak with Dr. Isidore Moos.

## 2017-04-30 ENCOUNTER — Ambulatory Visit
Admission: RE | Admit: 2017-04-30 | Discharge: 2017-04-30 | Disposition: A | Discharge status: Home / Self Care | Payer: Medicare HMO | Source: Ambulatory Visit | Attending: Radiation Oncology | Admitting: Radiation Oncology

## 2017-04-30 DIAGNOSIS — Z510 Encounter for antineoplastic radiation therapy: Secondary | ICD-10-CM | POA: Diagnosis not present

## 2017-05-01 ENCOUNTER — Ambulatory Visit
Admission: RE | Admit: 2017-05-01 | Discharge: 2017-05-01 | Disposition: A | Discharge status: Home / Self Care | Payer: Medicare HMO | Source: Ambulatory Visit | Attending: Radiation Oncology | Admitting: Radiation Oncology

## 2017-05-01 DIAGNOSIS — Z510 Encounter for antineoplastic radiation therapy: Secondary | ICD-10-CM | POA: Diagnosis not present

## 2017-05-02 ENCOUNTER — Ambulatory Visit
Admission: RE | Admit: 2017-05-02 | Discharge: 2017-05-02 | Disposition: A | Discharge status: Home / Self Care | Payer: Medicare HMO | Source: Ambulatory Visit | Attending: Radiation Oncology | Admitting: Radiation Oncology

## 2017-05-02 DIAGNOSIS — Z510 Encounter for antineoplastic radiation therapy: Secondary | ICD-10-CM | POA: Diagnosis not present

## 2017-05-03 ENCOUNTER — Other Ambulatory Visit: Payer: Self-pay | Admitting: Oncology

## 2017-05-03 ENCOUNTER — Ambulatory Visit
Admission: RE | Admit: 2017-05-03 | Discharge: 2017-05-03 | Disposition: A | Discharge status: Home / Self Care | Payer: Medicare HMO | Source: Ambulatory Visit | Attending: Radiation Oncology | Admitting: Radiation Oncology

## 2017-05-03 DIAGNOSIS — C8310 Mantle cell lymphoma, unspecified site: Secondary | ICD-10-CM

## 2017-05-03 DIAGNOSIS — Z510 Encounter for antineoplastic radiation therapy: Secondary | ICD-10-CM | POA: Diagnosis not present

## 2017-05-06 ENCOUNTER — Telehealth: Payer: Self-pay | Admitting: Pharmacy Technician

## 2017-05-06 ENCOUNTER — Ambulatory Visit
Admission: RE | Admit: 2017-05-06 | Discharge: 2017-05-06 | Disposition: A | Discharge status: Home / Self Care | Payer: Medicare HMO | Source: Ambulatory Visit | Attending: Radiation Oncology | Admitting: Radiation Oncology

## 2017-05-06 ENCOUNTER — Encounter (HOSPITAL_COMMUNITY): Payer: Self-pay

## 2017-05-06 ENCOUNTER — Ambulatory Visit: Payer: Medicare HMO

## 2017-05-06 DIAGNOSIS — Z510 Encounter for antineoplastic radiation therapy: Secondary | ICD-10-CM | POA: Diagnosis not present

## 2017-05-06 MED FILL — IMBRUVICA 560 MG TAB: 560 | 28 days supply | Qty: 28 | Fill #0

## 2017-05-06 NOTE — Telephone Encounter (Signed)
Oral Oncology Patient Advocate Encounter  Was successful in securing patient a $ 8300 grant from Patient [place] [facility]) to provide copayment coverage for his Imbruvica.  This will keep the out of pocket expense at $0.    I have spoken with the patient.    The billing information is as follows and has been shared with [place].   Member ID: [id number] Group ID: [id number] RxBin: [id number] Dates of Eligibility: 02/05/17 through 05/05/2018  Fabio Asa. Melynda Keller, [place] Patient [place] [phone] 05/06/2017 9:56 AM

## 2017-05-07 ENCOUNTER — Ambulatory Visit
Admission: RE | Admit: 2017-05-07 | Discharge: 2017-05-07 | Disposition: A | Discharge status: Home / Self Care | Payer: Medicare HMO | Source: Ambulatory Visit | Attending: Radiation Oncology | Admitting: Radiation Oncology

## 2017-05-07 ENCOUNTER — Encounter: Payer: Self-pay | Admitting: *Deleted

## 2017-05-07 DIAGNOSIS — Z510 Encounter for antineoplastic radiation therapy: Secondary | ICD-10-CM | POA: Diagnosis not present

## 2017-05-07 LAB — CHROMOSOME ANALYSIS, BONE MARROW

## 2017-05-10 NOTE — Progress Notes (Signed)
Met with pt during final RT to offer support and to celebrate end of radiation treatment.  He was unaccompanied. I provided verbal/written post-RT guidance:  Importance of keeping all follow-up appts, especially those with Nutrition and SLP.  Importance of protecting treatment area from sun.  Continuation of Sonafine application 2-3 times daily until supply exhausted after which transition to OTC lotion with vitamin E. I explained that my role as navigator will continue for several more months and that I will be calling and/or joining him during follow-up visits.   I encouraged him to call me with needs/concerns.   Patient verbalized understanding of information provided.  Gayleen Orem, RN, BSN, [facility] [phone]

## 2017-05-13 ENCOUNTER — Inpatient Hospital Stay: Payer: Medicare HMO

## 2017-05-13 ENCOUNTER — Inpatient Hospital Stay: Payer: Medicare HMO | Attending: Oncology | Admitting: Oncology

## 2017-05-13 ENCOUNTER — Telehealth: Payer: Self-pay | Admitting: Oncology

## 2017-05-13 VITALS — BP 133/46 | HR 72 | Temp 98.4°F | Resp 17 | Ht 66.0 in | Wt 140.6 lb

## 2017-05-13 DIAGNOSIS — D649 Anemia, unspecified: Secondary | ICD-10-CM | POA: Diagnosis not present

## 2017-05-13 DIAGNOSIS — Z79818 Long term (current) use of other agents affecting estrogen receptors and estrogen levels: Secondary | ICD-10-CM | POA: Diagnosis not present

## 2017-05-13 DIAGNOSIS — C8311 Mantle cell lymphoma, lymph nodes of head, face, and neck: Secondary | ICD-10-CM | POA: Diagnosis present

## 2017-05-13 DIAGNOSIS — Z923 Personal history of irradiation: Secondary | ICD-10-CM

## 2017-05-13 DIAGNOSIS — D696 Thrombocytopenia, unspecified: Secondary | ICD-10-CM | POA: Diagnosis not present

## 2017-05-13 DIAGNOSIS — Z87820 Personal history of traumatic brain injury: Secondary | ICD-10-CM | POA: Insufficient documentation

## 2017-05-13 DIAGNOSIS — C61 Malignant neoplasm of prostate: Secondary | ICD-10-CM | POA: Diagnosis not present

## 2017-05-13 DIAGNOSIS — Z79899 Other long term (current) drug therapy: Secondary | ICD-10-CM | POA: Diagnosis not present

## 2017-05-13 LAB — CBC WITH DIFFERENTIAL/PLATELET
BASOS PCT: 2 %
Basophils Absolute: 0.3 10*3/uL — ABNORMAL HIGH (ref 0.0–0.1)
EOS ABS: 0 10*3/uL (ref 0.0–0.5)
Eosinophils Relative: 0 %
HCT: 16.9 % — ABNORMAL LOW (ref 38.4–49.9)
HEMOGLOBIN: 5.2 g/dL — AB (ref 13.0–17.1)
LYMPHS PCT: 82 %
Lymphs Abs: 11.9 10*3/uL — ABNORMAL HIGH (ref 0.9–3.3)
MCH: 30 pg (ref 27.2–33.4)
MCHC: 30.7 g/dL — ABNORMAL LOW (ref 32.0–36.0)
MCV: 97.7 fL (ref 79.3–98.0)
Monocytes Absolute: 0.1 10*3/uL (ref 0.1–0.9)
Monocytes Relative: 1 %
NEUTROS PCT: 15 %
Neutro Abs: 2.2 10*3/uL (ref 1.5–6.5)
Platelets: 103 10*3/uL — ABNORMAL LOW (ref 140–400)
RBC: 1.73 MIL/uL — ABNORMAL LOW (ref 4.20–5.82)
RDW: 23.6 % — ABNORMAL HIGH (ref 11.0–15.6)
WBC: 14.5 10*3/uL — ABNORMAL HIGH (ref 4.0–10.3)

## 2017-05-13 LAB — COMPREHENSIVE METABOLIC PANEL
ALT: 9 U/L (ref 0–55)
ANION GAP: 8 (ref 3–11)
AST: 12 U/L (ref 5–34)
Albumin: 3 g/dL — ABNORMAL LOW (ref 3.5–5.0)
Alkaline Phosphatase: 52 U/L (ref 40–150)
BUN: 18 mg/dL (ref 7–26)
CO2: 22 mmol/L (ref 22–29)
Calcium: 8.4 mg/dL (ref 8.4–10.4)
Chloride: 111 mmol/L — ABNORMAL HIGH (ref 98–109)
Creatinine, Ser: 1.43 mg/dL — ABNORMAL HIGH (ref 0.70–1.30)
GFR calc non Af Amer: 47 mL/min — ABNORMAL LOW (ref >60)
GFR, EST AFRICAN AMERICAN: 54 mL/min — AB (ref >60)
Glucose, Bld: 91 mg/dL (ref 70–140)
POTASSIUM: 4.6 mmol/L (ref 3.5–5.1)
SODIUM: 141 mmol/L (ref 136–145)
Total Bilirubin: 0.4 mg/dL (ref 0.2–1.2)
Total Protein: 7 g/dL (ref 6.4–8.3)

## 2017-05-13 MED ORDER — LEUPROLIDE ACETATE (4 MONTH) 30 MG IM KIT
30.0000 mg | PACK | Freq: Once | INTRAMUSCULAR | Status: AC
  Administered 2017-05-13: 30 mg via INTRAMUSCULAR
  Filled 2017-05-13: qty 30

## 2017-05-13 NOTE — Progress Notes (Signed)
Hematology and Oncology Follow Up Visit  Joel [name] [id number] 14-Aug-1940 77 y.o. 05/13/2017 8:53 AM Joel [name], MDSanford, [name], MD   Principle Diagnosis: 77 year old man with the following issues:  1.  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  He is S/P TURP procedure which showed a prostate cancer Gleason score 3+3 = 6. Fluciclovine scan in October 2017 which showed bulky periaortic and retroperitoneal lymph nodes suggestive of advanced prostate cancer.  2.  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   Prior Therapy:   He is status post excisional biopsy of a right neck mass completed on 03/27/2017 with the pathology confirming mantle cell lymphoma.  He is status post palliative radiation therapy to his cervical adenopathy completed in January 2019.   Current therapy:   Imbruvica 560 mg daily in December 2018.   Interim History: Joel [name] is here by himself for a follow-up.  He completed radiation therapy to the cervical lymphadenopathy without any complications.  He denies any dysphasia, odontophagia or tenderness.  He denies any difficulty breathing or wheezing.  He noted that his cervical adenopathy decreased dramatically in response to radiation.  His energy and performance status is not affected and his weight was maintained.  He also started Imbruvica 560 mg daily and nearly completed 1 month of therapy.  He denies any complications related to this medication.  He denies any nausea, excessive fatigue, bleeding or thrombosis.  He denies any skin irritation or pruritus.  He still able to attend activities of daily living without any major decline in ability to do so.  He denies any exertional dyspnea.  He does not report any headaches, blurry vision, syncope or seizures.     He does not report any neuropathy or any neurological deficits.  He does not report any fevers, chills, sweats or appetite changes.  He does not report any chest pain, palpitation, orthopnea or leg edema.  He does not report any cough, wheezing or hemoptysis.  He does not report any nausea, vomiting or abdominal pain.  He does not report any frequency urgency or hesitancy.  He does not report any hematuria or dysuria.    He does not report any arthralgias, myalgias or bone pain.  He does not report any skin rashes or lesions.  He does not report any new lymphadenopathy, petechiae or easy bruising.  He denies any anxiety or depression.  Remaining review of systems is negative.  Medications: I have reviewed the patient's current medications.  Current Outpatient Medications  Medication Sig Dispense Refill  . IMBRUVICA 560 MG TABS TAKE 560 MG BY MOUTH DAILY. TAKE WITH A FULL GLASS OF WATER AT APPROXIMATELY THE SAME TIME EACH DAY. 28 tablet 0  . oxyCODONE-acetaminophen (ROXICET) 5-325 MG tablet Take 1 tablet by mouth every 4 (four) hours as needed for severe pain. (Patient not taking: Reported on 04/05/2017) 15 tablet 0   No current facility-administered medications for this visit.      Allergies: No Known Allergies  Past Medical History, Surgical history, Social history, and Family History discussed today and remained unchanged.  Physical Exam: Blood pressure (!) 133/46, pulse 72, temperature 98.4 F (36.9 C), temperature source Oral, resp. rate 17, height 5\' 6"  (1.676 m), weight 140 lb 9.6 oz (63.8 kg), SpO2 100 %. ECOG:1 General appearance: Alert,  awake gentleman appeared without distress. Head: Normocephalic, without obvious abnormality  Oral mucosa without thrush or ulcers. Eyes: No scleral icterus.  Pupils are equal and round reactive to light. Lymph nodes: Substantial decrease in his right-sided cervical lymphadenopathy.  No enlarged lymph glands noted the inguinal, axillary or  supraclavicular areas. Heart:regular rate and rhythm, S1, S2 normal, no murmur, click, rub or gallop Lung:chest clear, no wheezing, rales, normal symmetric air entry.  Abdomin: soft, non-tender, without masses or organomegaly  Musculoskeletal: No joint effusion or deformity.  No edema noted. Skin: No rashes or lesions. Neurological: No deficits noted.  Lab Results: Lab Results  Component Value Date   WBC 14.5 (H) 05/13/2017   HGB 5.2 (LL) 05/13/2017   HCT 16.9 (L) 05/13/2017   MCV 97.7 05/13/2017   PLT 103 (L) 05/13/2017     Chemistry      Component Value Date/Time   NA 141 04/04/2017 1030   K 4.4 04/04/2017 1030   CL 106 03/27/2017 0713   CO2 20 (L) 04/04/2017 1030   BUN 29.2 (H) 04/04/2017 1030   CREATININE 1.7 (H) 04/04/2017 1030      Component Value Date/Time   CALCIUM 8.8 04/04/2017 1030   ALKPHOS 101 04/04/2017 1030   AST 31 04/04/2017 1030   ALT 10 04/04/2017 1030   BILITOT 0.56 04/04/2017 1030      Impression and Plan:  77 year old gentleman with the following issues:  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  His staging workup showed lymphadenopathy above and below the diaphragm as well as bone marrow involvement.  Imbruvica 560 mg daily started in December 2018 and completed close to 30 days without complications.  His white cell count has decreased which indicate potential response to therapy.  The natural course of this disease was reviewed again and alternative treatment options were reviewed.  These were include multiagent chemotherapy followed by stem cell transplant which I do not believe he is a candidate for.  This remains an incurable malignancy and palliative approach is recommended.  The plan is to continue with the same dose and schedule without any adjustment and monitor his counts closely.   2.  Prostate cancer diagnosed in April 2017: He presented with urinary retention and a PSA of 95.5.     Imaging studies showed lymphadenopathy which is suggestive of advanced malignancy.  He will start Lupron at 30 mg daily every 4 months starting today.  Complication associated with this medication were reviewed today which include hot flashes, weight gain as well as osteoporosis long-term.  The natural course of his prostate cancer was discussed.  This will also be in an incurable malignancy but a disease that can be palliated moving forward.  3.  Anemia: This is related to malignancy and his lymphoma.  He received packed red cell transfusion in the past with improvement in his symptoms.  His hemoglobin is low today.  Although he is minimally symptomatic we will proceed with 2 units of packed red cell transfusion in the immediate future pending schedule availability.  4.  Cervical adenopathy: This is related to mental cell lymphoma and responded very well to radiation therapy for palliative purposes.  5.  Cerebrovascular accident: Patient was evaluated by Dr. Mickeal Skinner.  He had a an MRI which demonstrated several old infarcts.  He will be monitored periodically from a neuro oncology standpoint.  6.  Thrombocytopenia: Related to mental cell lymphoma as well as his therapy.  7.  Follow-up: Will be 3 weeks to follow his progress.  25 minutes was spent with the patient face-to-face today.  More than 50% of time was dedicated to patient counseling, education and coordination of the patient's multiple conditions.        Zola Button, MD 1/21/20198:53 AM

## 2017-05-13 NOTE — Patient Instructions (Signed)
Leuprolide depot injection What is this medicine? LEUPROLIDE (loo PROE lide) is a man-made protein that acts like a natural hormone in the body. It decreases testosterone in men and decreases estrogen in women. In men, this medicine is used to treat advanced prostate cancer. In women, some forms of this medicine may be used to treat endometriosis, uterine fibroids, or other male hormone-related problems. This medicine may be used for other purposes; ask your health care provider or pharmacist if you have questions. COMMON BRAND NAME(S): Eligard, Lupron Depot, Lupron Depot-Ped, Viadur What should I tell my health care provider before I take this medicine? They need to know if you have any of these conditions: -diabetes -heart disease or previous heart attack -high blood pressure -high cholesterol -mental illness -osteoporosis -pain or difficulty passing urine -seizures -spinal cord metastasis -stroke -suicidal thoughts, plans, or attempt; a previous suicide attempt by you or a family member -tobacco smoker -unusual vaginal bleeding (women) -an unusual or allergic reaction to leuprolide, benzyl alcohol, other medicines, foods, dyes, or preservatives -pregnant or trying to get pregnant -breast-feeding How should I use this medicine? This medicine is for injection into a muscle or for injection under the skin. It is given by a health care professional in a hospital or clinic setting. The specific product will determine how it will be given to you. Make sure you understand which product you receive and how often you will receive it. Talk to your pediatrician regarding the use of this medicine in children. Special care may be needed. Overdosage: If you think you have taken too much of this medicine contact a poison control center or emergency room at once. NOTE: This medicine is only for you. Do not share this medicine with others. What if I miss a dose? It is important not to miss a dose.  Call your doctor or health care professional if you are unable to keep an appointment. Depot injections: Depot injections are given either once-monthly, every 12 weeks, every 16 weeks, or every 24 weeks depending on the product you are prescribed. The product you are prescribed will be based on if you are male or male, and your condition. Make sure you understand your product and dosing. What may interact with this medicine? Do not take this medicine with any of the following medications: -chasteberry This medicine may also interact with the following medications: -herbal or dietary supplements, like black cohosh or DHEA -male hormones, like estrogens or progestins and birth control pills, patches, rings, or injections -male hormones, like testosterone This list may not describe all possible interactions. Give your health care provider a list of all the medicines, herbs, non-prescription drugs, or dietary supplements you use. Also tell them if you smoke, drink alcohol, or use illegal drugs. Some items may interact with your medicine. What should I watch for while using this medicine? Visit your doctor or health care professional for regular checks on your progress. During the first weeks of treatment, your symptoms may get worse, but then will improve as you continue your treatment. You may get hot flashes, increased bone pain, increased difficulty passing urine, or an aggravation of nerve symptoms. Discuss these effects with your doctor or health care professional, some of them may improve with continued use of this medicine. Male patients may experience a menstrual cycle or spotting during the first months of therapy with this medicine. If this continues, contact your doctor or health care professional. What side effects may I notice from receiving this medicine? Side   effects that you should report to your doctor or health care professional as soon as possible: -allergic reactions like skin  rash, itching or hives, swelling of the face, lips, or tongue -breathing problems -chest pain -depression or memory disorders -pain in your legs or groin -pain at site where injected or implanted -seizures -severe headache -swelling of the feet and legs -suicidal thoughts or other mood changes -visual changes -vomiting Side effects that usually do not require medical attention (report to your doctor or health care professional if they continue or are bothersome): -breast swelling or tenderness -decrease in sex drive or performance -diarrhea -hot flashes -loss of appetite -muscle, joint, or bone pains -nausea -redness or irritation at site where injected or implanted -skin problems or acne This list may not describe all possible side effects. Call your doctor for medical advice about side effects. You may report side effects to FDA at 1-800-FDA-1088. Where should I keep my medicine? This drug is given in a hospital or clinic and will not be stored at home. NOTE: This sheet is a summary. It may not cover all possible information. If you have questions about this medicine, talk to your doctor, pharmacist, or health care provider.  2018 Elsevier/Gold Standard (2015-09-22 09:45:53)  

## 2017-05-13 NOTE — Telephone Encounter (Signed)
Gave patient avs and calendar with appts per 1/21 los.  °

## 2017-05-14 ENCOUNTER — Inpatient Hospital Stay: Payer: Medicare HMO

## 2017-05-14 ENCOUNTER — Other Ambulatory Visit: Payer: Self-pay | Admitting: *Deleted

## 2017-05-14 ENCOUNTER — Ambulatory Visit (HOSPITAL_COMMUNITY)
Admission: RE | Admit: 2017-05-14 | Discharge: 2017-05-14 | Disposition: A | Discharge status: Home / Self Care | Payer: Medicare HMO | Source: Ambulatory Visit | Attending: Oncology | Admitting: Oncology

## 2017-05-14 ENCOUNTER — Encounter: Payer: Self-pay | Admitting: Radiation Oncology

## 2017-05-14 DIAGNOSIS — C61 Malignant neoplasm of prostate: Secondary | ICD-10-CM

## 2017-05-14 DIAGNOSIS — D649 Anemia, unspecified: Secondary | ICD-10-CM | POA: Diagnosis present

## 2017-05-14 DIAGNOSIS — C8311 Mantle cell lymphoma, lymph nodes of head, face, and neck: Secondary | ICD-10-CM | POA: Diagnosis not present

## 2017-05-14 LAB — SAMPLE TO BLOOD BANK

## 2017-05-14 LAB — PROSTATE-SPECIFIC AG, SERUM (LABCORP): PROSTATE SPECIFIC AG, SERUM: 301.5 ng/mL — AB (ref 0.0–4.0)

## 2017-05-14 LAB — PREPARE RBC (CROSSMATCH)

## 2017-05-14 MED ORDER — ACETAMINOPHEN 325 MG PO TABS
650.0000 mg | ORAL_TABLET | Freq: Once | ORAL | Status: AC
  Administered 2017-05-14: 650 mg via ORAL

## 2017-05-14 MED ORDER — SODIUM CHLORIDE 0.9 % IV SOLN
250.0000 mL | Freq: Once | INTRAVENOUS | Status: AC
  Administered 2017-05-14: 250 mL via INTRAVENOUS

## 2017-05-14 MED ORDER — DIPHENHYDRAMINE HCL 25 MG PO CAPS
25.0000 mg | ORAL_CAPSULE | Freq: Once | ORAL | Status: AC
  Administered 2017-05-14: 25 mg via ORAL

## 2017-05-14 NOTE — Patient Instructions (Signed)

## 2017-05-14 NOTE — Progress Notes (Addendum)
  Radiation Oncology         [phone]) [phone] ________________________________  Name: Joel [name] MRN: [id number]  Date: 05/14/2017  DOB: 1940/11/03  End of Treatment Note  Diagnosis:   Mantle cell lymphoma with bulky right neck mass  Indication for treatment:  Palliative       Radiation treatment dates:   04/17/17 - 05/07/17  Site/dose:   Right neck treated to 32.5 Gy with 13 fx of 2.5 Gy  Beams/energy:   VMAT IMRT / 6X  Narrative: The patient tolerated radiation treatment relatively well.   He denied pain but did endorse some throat irritation. He also endorsed fatigue. The mass shrank so briskly that his plan was revised to account for decreased tumor volume, midway.  Plan: The patient has completed radiation treatment. The patient will return to radiation oncology clinic for routine followup in one half month. I advised them to call or return sooner if they have any questions or concerns related to their recovery or treatment.  -----------------------------------  Eppie Gibson, MD  This document serves as a record of services personally performed by Eppie Gibson, MD. It was created on his behalf by Linward Natal, a trained medical scribe. The creation of this record is based on the scribe's personal observations and the provider's statements to them. This document has been checked and approved by the attending provider.

## 2017-05-15 LAB — TYPE AND SCREEN
ABO/RH(D): O POS
ANTIBODY SCREEN: NEGATIVE
Unit division: 0
Unit division: 0

## 2017-05-15 LAB — BPAM RBC
BLOOD PRODUCT EXPIRATION DATE: 201902202359
Blood Product Expiration Date: 201902202359
ISSUE DATE / TIME: 201901221332
ISSUE DATE / TIME: 201901221332
UNIT TYPE AND RH: 5100
Unit Type and Rh: 5100

## 2017-05-20 ENCOUNTER — Encounter: Payer: Self-pay | Admitting: Radiation Oncology

## 2017-05-20 NOTE — Progress Notes (Signed)
Joel [name] presents for follow up of radiation completed 05/07/17 to his Right neck.   Pain issues, if any: He denies Using a feeding tube?: N/A Weight changes, if any:  Wt Readings from Last 3 Encounters:  05/24/17 152 lb 9.6 oz (69.2 kg)  05/13/17 140 lb 9.6 oz (63.8 kg)  04/22/17 139 lb 12.8 oz (63.4 kg)   Swallowing issues, if any: He denies.  Smoking or chewing tobacco? No Using fluoride trays daily? No Last ENT visit was on: N/A Other notable issues, if any:  His skin has healed well. He is still applying sonafine to his radiation area. He will use vitamin E lotion/oil when the sonafine is complete.  Dr. Alen Blew 05/13/17 He is taking Imbruivira prescribed by Dr. Alen Blew.   BP (!) 144/73   Pulse 64   Temp 98.9 F (37.2 C)   Ht 5\' 6"  (1.676 m)   Wt 152 lb 9.6 oz (69.2 kg)   SpO2 100% Comment: room air  BMI 24.63 kg/m

## 2017-05-24 ENCOUNTER — Encounter: Payer: Self-pay | Admitting: Radiation Oncology

## 2017-05-24 ENCOUNTER — Ambulatory Visit
Admission: RE | Admit: 2017-05-24 | Discharge: 2017-05-24 | Disposition: A | Discharge status: Home / Self Care | Payer: Medicare HMO | Source: Ambulatory Visit | Attending: Radiation Oncology | Admitting: Radiation Oncology

## 2017-05-24 VITALS — BP 144/73 | HR 64 | Temp 98.9°F | Ht 66.0 in | Wt 152.6 lb

## 2017-05-24 DIAGNOSIS — C8311 Mantle cell lymphoma, lymph nodes of head, face, and neck: Secondary | ICD-10-CM | POA: Diagnosis not present

## 2017-05-24 DIAGNOSIS — Z510 Encounter for antineoplastic radiation therapy: Secondary | ICD-10-CM | POA: Diagnosis present

## 2017-05-24 DIAGNOSIS — Z923 Personal history of irradiation: Secondary | ICD-10-CM | POA: Insufficient documentation

## 2017-05-24 HISTORY — DX: Personal history of irradiation: Z92.3

## 2017-05-24 NOTE — Progress Notes (Signed)
Radiation Oncology         (336) [phone] ________________________________  Name: Joel [name] MRN: [id number]  Date: 05/24/2017  DOB: Mar 18, 1941  Follow-Up Visit Note  CC: [name], MD  [name], MD  Diagnosis and Prior Radiotherapy:       ICD-10-CM   1. 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  He is doing well overall.          OF note, during RT he underwent a PET scan on 04/25/17 with results of: IMPRESSION: Bilateral cervical lymphadenopathy, right side greater than left, with low-grade metabolic activity. Area in the right supraclavicular region has decreased in size compared to prior chest CT. Near complete resolution of mediastinal and hilar lymphadenopathy, with low-grade metabolic activity noted in bilateral hilar regions. Marked decrease in size of upper abdominal lymphadenopathy since previous abdomen pelvis CT, which shows no significant metabolic activity. Retroperitoneal lymphadenopathy in the abdomen and pelvis shows mild decrease in size since previous CT, but shows much higher metabolic activity than adenopathy elsewhere. This is suspicious for metastatic prostate carcinoma rather than mantle cell lymphoma. Focal hypermetabolic activity in the left anterior apex of the prostate, consistent with local recurrence of prostate carcinoma. Stable focal area of nodular infiltrate in the medial right upper lobe, most likely inflammatory, and left lower lobe bronchoceles.   On review of systems, he denies any other symptoms.              ALLERGIES:  has No Known Allergies.  Meds: Current Outpatient Medications  Medication Sig Dispense Refill  . IMBRUVICA 560 MG TABS  TAKE 560 MG BY MOUTH DAILY. TAKE WITH A FULL GLASS OF WATER AT APPROXIMATELY THE SAME TIME EACH DAY. 28 tablet 0  . oxyCODONE-acetaminophen (ROXICET) 5-325 MG tablet Take 1 tablet by mouth every 4 (four) hours as needed for severe pain. (Patient not taking: Reported on 04/05/2017) 15 tablet 0   No current facility-administered medications for this encounter.     Physical Findings: The patient is in no acute distress. Patient is alert and oriented. Wt Readings from Last 3 Encounters:  05/24/17 152 lb 9.6 oz (69.2 kg)  05/13/17 140 lb 9.6 oz (63.8 kg)  04/22/17 139 lb 12.8 oz (63.4 kg)    height is 5\' 6"  (1.676 m) and weight is 152 lb 9.6 oz (69.2 kg). His temperature is 98.9 F (37.2 C). His blood pressure is 144/73 (abnormal) and his pulse is 64. His oxygen saturation is 100%. .  General: Alert and oriented, in no acute distress HEENT: Head is normocephalic. Extraocular movements are intact. Oropharynx is notable for oral cavity is clear.  Neck: Neck is notable for Modest general fullness in right neck that extends into the supraclavicular region, but overall he has had a tremendous response to the radiation.  Skin: Skin in treatment fields shows satisfactory healing. Little residual hyperpigmentation. No dryness Psychiatric: Judgment and insight are intact. Affect is appropriate.   Lab Findings: Lab Results  Component Value Date   WBC 14.5 (H) 05/13/2017   HGB 5.2 (LL) 05/13/2017   HCT 16.9 (L) 05/13/2017   MCV 97.7 05/13/2017   PLT  103 (L) 05/13/2017    No results found for: TSH  Radiographic Findings: Nm Pet Image Initial (pi) Skull Base To Thigh  Result Date: 04/25/2017 CLINICAL DATA:  Initial treatment strategy for mantle cell lymphoma. Metastatic prostate carcinoma. EXAM: NUCLEAR MEDICINE PET SKULL BASE TO THIGH TECHNIQUE: 8.2 mCi F-18 FDG was injected intravenously. Full-ring PET imaging was performed from the skull base to thigh after the radiotracer. CT data was obtained  and used for attenuation correction and anatomic localization. FASTING BLOOD GLUCOSE:  Value: 94 mg/dl COMPARISON:  AP CT on 04/03/2017 and chest CT on 02/27/2017 FINDINGS: NECK: Large right neck soft tissue mass or lymphadenopathy throughout the jugular, posterior triangle, and supraclavicular regions shows low-grade metabolic activity, with SUV max of 2.4. In the right supraclavicular region, this measures 4.5 cm in short axis on image 45/4 compared with 5.7 cm at same level on previous chest CT. Mild left supraclavicular lymphadenopathy measures 1.6 cm on image 48/4, without significant change compared to previous CT. This shows minimal FDG uptake, with SUV max of 1.6. CHEST: Previously seen mediastinal and bilateral hilar lymphadenopathy has nearly completely resolved compared to previous CT. No abnormal FDG uptake seen within mediastinum. Mild FDG uptake noted in both hilar regions, with SUV max of 2.8 on the left and 2.7 on the right. Focal area of nodular infiltrate in the medial right upper is unchanged, with mild FDG uptake measuring 2.0. This is likely inflammatory in etiology. 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 No evidence of pleural effusion . ABDOMEN/PELVIS: Upper abdominal lymphadenopathy in the porta hepatis, gastrohepatic ligament, and peripancreatic regions has significantly decreased since previous study. Index area the porta hepatis currently measures 4.3 cm in short axis on image 115/4 compared to 5.4 cm previously. This shows absence of hypermetabolic activity. Abdominal retroperitoneal lymphadenopathy encasing the abdominal aorta and IVC has also decreased since previous study, with index area in the aortocaval space measuring 3.5 cm in short axis on image 132/4 compared to 4.7 cm previously. This lymphadenopathy does show hypermetabolic activity, with SUV max measuring 6.2. Pelvic lymphadenopathy is seen throughout the  iliac chains and inguinal regions bilaterally, also mildly decreased, with index area left external iliac chain measuring 2.2 cm on image 173/4 compared to 2.7 cm previously. This lymphadenopathy also shows hypermetabolic activity, with SUV max in the right external iliac chain measuring 6.2. A focal area of hypermetabolic activity is also seen left anterior apex of the prostate, which has SUV max of 6.5 . Stable diffuse wall thickening of urinary bladder. SKELETON: No focal hypermetabolic bone lesions to suggest skeletal metastasis. IMPRESSION: Bilateral cervical lymphadenopathy, right side greater than left, with low-grade metabolic activity. Area in the right supraclavicular region has decreased in size compared to prior chest CT. Near complete resolution of mediastinal and hilar lymphadenopathy, with low-grade metabolic activity noted in bilateral hilar regions. Marked decrease in size of upper abdominal lymphadenopathy since previous abdomen pelvis CT, which shows no significant metabolic activity. Retroperitoneal lymphadenopathy in the abdomen and pelvis shows mild decrease in size since previous CT, but shows much higher metabolic activity than adenopathy elsewhere. This is suspicious for metastatic prostate carcinoma rather than mantle cell lymphoma. Focal hypermetabolic activity in the left anterior apex of the prostate, consistent with local recurrence of prostate carcinoma. Stable focal area of nodular infiltrate in the medial right upper lobe, most likely inflammatory, and left lower lobe bronchoceles. Recommend continued attention on follow-up imaging. Electronically  Signed   By: Earle Gell M.D.   On: 04/25/2017 09:50    Impression/Plan:    1) Head and Neck Cancer Status: healing well from radiation therapy with excellent response.  2) Nutritional Status: N/A PEG tube: N/A  3) Continue f/u with med/onc and in rad/onc he shall follow-up PRN. The patient was encouraged to call with any issues  or questions before then.   _____________________________________   Eppie Gibson, MD  This document serves as a record of services personally performed by Eppie Gibson, MD. It was created on her behalf by Steva Colder, a trained medical scribe. The creation of this record is based on the scribe's personal observations and the provider's statements to them. This document has been checked and approved by the attending provider.

## 2017-05-31 ENCOUNTER — Other Ambulatory Visit: Payer: Self-pay | Admitting: Oncology

## 2017-05-31 DIAGNOSIS — C8310 Mantle cell lymphoma, unspecified site: Secondary | ICD-10-CM

## 2017-06-03 ENCOUNTER — Inpatient Hospital Stay: Payer: Medicare HMO | Attending: Oncology | Admitting: Oncology

## 2017-06-03 ENCOUNTER — Telehealth: Payer: Self-pay

## 2017-06-03 ENCOUNTER — Inpatient Hospital Stay: Payer: Medicare HMO

## 2017-06-03 VITALS — BP 168/79 | HR 71 | Temp 98.0°F | Resp 20 | Ht 66.0 in | Wt 149.0 lb

## 2017-06-03 DIAGNOSIS — D696 Thrombocytopenia, unspecified: Secondary | ICD-10-CM | POA: Insufficient documentation

## 2017-06-03 DIAGNOSIS — Z923 Personal history of irradiation: Secondary | ICD-10-CM | POA: Diagnosis not present

## 2017-06-03 DIAGNOSIS — C8311 Mantle cell lymphoma, lymph nodes of head, face, and neck: Secondary | ICD-10-CM

## 2017-06-03 DIAGNOSIS — D649 Anemia, unspecified: Secondary | ICD-10-CM | POA: Insufficient documentation

## 2017-06-03 DIAGNOSIS — Z79899 Other long term (current) drug therapy: Secondary | ICD-10-CM | POA: Insufficient documentation

## 2017-06-03 DIAGNOSIS — C61 Malignant neoplasm of prostate: Secondary | ICD-10-CM | POA: Insufficient documentation

## 2017-06-03 LAB — CMP (CANCER CENTER ONLY)
ALK PHOS: 54 U/L (ref 40–150)
ALT: 10 U/L (ref 0–55)
ANION GAP: 7 (ref 3–11)
AST: 18 U/L (ref 5–34)
Albumin: 3.3 g/dL — ABNORMAL LOW (ref 3.5–5.0)
BILIRUBIN TOTAL: 0.4 mg/dL (ref 0.2–1.2)
BUN: 21 mg/dL (ref 7–26)
CALCIUM: 8.8 mg/dL (ref 8.4–10.4)
CO2: 27 mmol/L (ref 22–29)
Chloride: 107 mmol/L (ref 98–109)
Creatinine: 1.3 mg/dL (ref 0.70–1.30)
GFR, EST NON AFRICAN AMERICAN: 52 mL/min — AB (ref >60)
Glucose, Bld: 108 mg/dL (ref 70–140)
Potassium: 4.4 mmol/L (ref 3.5–5.1)
SODIUM: 141 mmol/L (ref 136–145)
TOTAL PROTEIN: 6.9 g/dL (ref 6.4–8.3)

## 2017-06-03 LAB — SAMPLE TO BLOOD BANK

## 2017-06-03 LAB — CBC WITH DIFFERENTIAL (CANCER CENTER ONLY)
BASOS ABS: 0.1 10*3/uL (ref 0.0–0.1)
BASOS PCT: 1 %
EOS ABS: 0.1 10*3/uL (ref 0.0–0.5)
Eosinophils Relative: 1 %
HCT: 27 % — ABNORMAL LOW (ref 38.4–49.9)
Hemoglobin: 8.6 g/dL — ABNORMAL LOW (ref 13.0–17.1)
LYMPHS PCT: 62 %
Lymphs Abs: 3.6 10*3/uL — ABNORMAL HIGH (ref 0.9–3.3)
MCH: 30.1 pg (ref 27.2–33.4)
MCHC: 31.7 g/dL — AB (ref 32.0–36.0)
MCV: 95 fL (ref 79.3–98.0)
MONO ABS: 0.6 10*3/uL (ref 0.1–0.9)
Monocytes Relative: 10 %
NEUTROS PCT: 26 %
Neutro Abs: 1.5 10*3/uL (ref 1.5–6.5)
PLATELETS: 127 10*3/uL — AB (ref 140–400)
RBC: 2.84 MIL/uL — ABNORMAL LOW (ref 4.20–5.82)
RDW: 18.3 % — ABNORMAL HIGH (ref 11.0–14.6)
WBC Count: 5.9 10*3/uL (ref 4.0–10.3)

## 2017-06-03 NOTE — Progress Notes (Signed)
Hematology and Oncology Follow Up Visit  Joel [name] [id number] January 01, 1941 77 y.o. 06/03/2017 8:52 AM Joel [name], MDSanford, Joel Leeds, MD   Principle Diagnosis: 77 year old man with:  1.  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 He is S/P TURP procedure which showed a prostate cancer Gleason score 3+3 = 6. Fluciclovine scan in October 2017 showed lymphadenopathy.  2.  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  Biopsy from the presence of disease in his neck adenopathy.  Bone marrow biopsy on 04/15/2017 confirmed mantle cell lymphoma involvement.   Prior Therapy:   He is status post excisional biopsy of a right neck mass completed on 03/27/2017 with the pathology confirming mantle cell lymphoma.  He is status post palliative radiation therapy to his cervical adenopathy completed in January 2019.   Current therapy:   Imbruvica 560 mg daily in December 2018.  Lupron 30 mg once every 4 months.  Started in January 2019.   Interim History: Joel [name] presents for a follow-up with his wife.  Since his last visit, he continues to do well without any major changes.  He received 2 units of packed red cell transfusion with improvement in his performance status.  His neck adenopathy have completely resolved and does not report any fatigue, tiredness or dyspnea on exertion.  His appetite is excellent and have gained weight.  He continues to take Imbruvica 560 mg daily without complications close to 2 months of therapy.  He denies any nausea, excessive fatigue, bleeding.  He denies any skin irritation or pruritus.  He denies any palpitation or thrombosis episodes.  He does not report any headaches, blurry vision, syncope or seizures.    He does not report any neuropathy or any neurological deficits.  He does not  report any fevers, chills, sweats or appetite changes.  He does not report any chest pain, palpitation, orthopnea or leg edema.  He does not report any cough, wheezing or hemoptysis.  He does not report any nausea, vomiting or abdominal pain.  He does not report any frequency urgency or hesitancy.  He does not report any hematuria or dysuria.    He does not report any arthralgias, myalgias. He does not report any ecchymosis or bruising.  He does not report any new lymphadenopathy, petechiae or easy bruising.  He denies any anxiety or depression.  Remaining review of systems is negative.  Medications: I have reviewed the patient's current medications.  Current Outpatient Medications  Medication Sig Dispense Refill  . IMBRUVICA 560 MG TABS TAKE 1 TABLET (560 MG) BY MOUTH DAILY. TAKE WITH A FULL GLASS OF WATER AT APPROXIMATELY THE SAME TIME EACH DAY. 28 tablet 0  . oxyCODONE-acetaminophen (ROXICET) 5-325 MG tablet Take 1 tablet by mouth every 4 (four) hours as needed for severe pain. (Patient not taking: Reported on 04/05/2017) 15 tablet 0   No current facility-administered medications for this visit.      Allergies: No Known Allergies  Past Medical History, Surgical history, Social history, and Family History discussed today and remained unchanged.  Physical Exam: Blood pressure (!) 168/79, pulse 71, temperature 98 F (36.7 C), temperature source Oral, resp. rate 20, height _0  (1.676 m), weight 149 lb (67.6 kg), SpO2 100 %. ECOG:1 General appearance: Well-appearing gentleman appeared comfortable. Head: Normocephalic, without obvious abnormality.  Atraumatic. Oropharynx: Mucosa is moist  and pink.  No ulcers or thrush.   Eyes: No scleral icterus.  Lymph nodes: No cervical adenopathy noted at this time.  His previous adenopathy has completely resolved.   Heart:regular rate and rhythm, S1, S2 normal, no murmur, click, rub or gallop Lung: clear to auscultation in all lung fields.  Without any  wheezing or dullness. Abdomin: soft, non-tender, without masses or organomegaly good bowel sounds in all 4 quadrants. Musculoskeletal: Full range of motion in all joints. Skin: No rashes or lesions.  Without ecchymosis or petechiae. Neurological: No deficits noted.  No motor, sensory deficits.  Lab Results: Lab Results  Component Value Date   WBC 5.9 06/03/2017   HGB 5.2 (LL) 05/13/2017   HCT 27.0 (L) 06/03/2017   MCV 95.0 06/03/2017   PLT 127 (L) 06/03/2017     Chemistry      Component Value Date/Time   NA 141 05/13/2017 0815   NA 141 04/04/2017 1030   K 4.6 05/13/2017 0815   K 4.4 04/04/2017 1030   CL 111 (H) 05/13/2017 0815   CO2 22 05/13/2017 0815   CO2 20 (L) 04/04/2017 1030   BUN 18 05/13/2017 0815   BUN 29.2 (H) 04/04/2017 1030   CREATININE 1.43 (H) 05/13/2017 0815   CREATININE 1.7 (H) 04/04/2017 1030      Component Value Date/Time   CALCIUM 8.4 05/13/2017 0815   CALCIUM 8.8 04/04/2017 1030   ALKPHOS 52 05/13/2017 0815   ALKPHOS 101 04/04/2017 1030   AST 12 05/13/2017 0815   AST 31 04/04/2017 1030   ALT 9 05/13/2017 0815   ALT 10 04/04/2017 1030   BILITOT 0.4 05/13/2017 0815   BILITOT 0.56 04/04/2017 1030      Impression and Plan:  77 year old gentleman with the following issues:  1. 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  Bone marrow biopsy obtained in December 2019 also confirmed the presence of mantle cell lymphoma.  He had severe pancytopenia could not receive systemic chemotherapy.  He started Imbruvica 560 mg daily started in December 3235 without complications.  His laboratory data were personally reviewed today and showed near complete response with normalization of his white cell count.  He has no longer reporting any lymphadenopathy.  The natural course of this disease was personally reviewed with the patient and his wife.  The plan is to continue with oral targeted therapy for  now and use multiagent systemic chemotherapy if his counts continue to improve.  2.  Prostate cancer diagnosed in April 2017: He presented with urinary retention and a PSA of 95.5.    Imaging studies showed lymphadenopathy related to advanced prostate cancer.  He received Lupron in January 5732 without complications.  Risks and benefits of continuing Lupron long-term was reviewed today he is agreeable to continue.  3.  Anemia: Multifactorial in etiology related to lymphoma involvement in the bone marrow.  His hemoglobin is adequate today and does not require transfusion.  4.  Cerebrovascular accident: No recent infarcts noted.  He followed with Dr. Mickeal Skinner regarding this issue.  5.  Thrombocytopenia: Platelet count is 127 which continues to improve with the treatment of his lymphoma.  6.  Follow-up: Will be in 2 weeks to recheck his hemoglobin and transfuse as needed.  Have an MD follow-up in 5 weeks.  30 minutes was spent with the patient face-to-face today.  More than 50% of time was dedicated to patient counseling, education and answering questions that he  and his wife had regarding about his 2 cancers and future implications.        Zola Button, MD 2/11/20198:52 AM

## 2017-06-03 NOTE — Telephone Encounter (Signed)
Printed avs and calender for upcoming appointment. Per 2/11 los 

## 2017-06-04 ENCOUNTER — Other Ambulatory Visit: Payer: Self-pay | Admitting: Pharmacist

## 2017-06-05 MED FILL — IMBRUVICA 560 MG TAB: 560 | 28 days supply | Qty: 28 | Fill #0

## 2017-06-18 ENCOUNTER — Inpatient Hospital Stay: Payer: Medicare HMO

## 2017-06-18 ENCOUNTER — Encounter: Payer: Self-pay | Admitting: *Deleted

## 2017-06-18 DIAGNOSIS — C8311 Mantle cell lymphoma, lymph nodes of head, face, and neck: Secondary | ICD-10-CM | POA: Diagnosis not present

## 2017-06-18 LAB — CBC WITH DIFFERENTIAL (CANCER CENTER ONLY)
BLASTS: 0 %
Band Neutrophils: 1 %
Basophils Absolute: 0 10*3/uL (ref 0.0–0.1)
Basophils Relative: 0 %
Eosinophils Absolute: 0 10*3/uL (ref 0.0–0.5)
Eosinophils Relative: 0 %
HCT: 28.6 % — ABNORMAL LOW (ref 38.4–49.9)
HEMOGLOBIN: 9 g/dL — AB (ref 13.0–17.1)
LYMPHS PCT: 18 %
Lymphs Abs: 1.2 10*3/uL (ref 0.9–3.3)
MCH: 29.1 pg (ref 27.2–33.4)
MCHC: 31.5 g/dL — AB (ref 32.0–36.0)
MCV: 92.6 fL (ref 79.3–98.0)
Metamyelocytes Relative: 1 %
Monocytes Absolute: 0.3 10*3/uL (ref 0.1–0.9)
Monocytes Relative: 5 %
Myelocytes: 0 %
NEUTROS PCT: 61 %
NRBC: 0 /100{WBCs}
Neutro Abs: 4.1 10*3/uL (ref 1.5–6.5)
OTHER: 14 %
PROMYELOCYTES ABS: 0 %
Platelet Count: 155 10*3/uL (ref 140–400)
RBC: 3.09 MIL/uL — ABNORMAL LOW (ref 4.20–5.82)
RDW: 15.5 % — ABNORMAL HIGH (ref 11.0–14.6)
WBC Count: 6.5 10*3/uL (ref 4.0–10.3)

## 2017-06-18 LAB — CMP (CANCER CENTER ONLY)
ALBUMIN: 3.4 g/dL — AB (ref 3.5–5.0)
ALK PHOS: 51 U/L (ref 40–150)
ALT: 17 U/L (ref 0–55)
ANION GAP: 8 (ref 3–11)
AST: 27 U/L (ref 5–34)
BUN: 25 mg/dL (ref 7–26)
CO2: 25 mmol/L (ref 22–29)
CREATININE: 1.34 mg/dL — AB (ref 0.70–1.30)
Calcium: 9.1 mg/dL (ref 8.4–10.4)
Chloride: 106 mmol/L (ref 98–109)
GFR, EST AFRICAN AMERICAN: 59 mL/min — AB (ref >60)
GFR, Estimated: 51 mL/min — ABNORMAL LOW (ref >60)
GLUCOSE: 93 mg/dL (ref 70–140)
Potassium: 3.7 mmol/L (ref 3.5–5.1)
Sodium: 139 mmol/L (ref 136–145)
TOTAL PROTEIN: 7.7 g/dL (ref 6.4–8.3)
Total Bilirubin: 0.7 mg/dL (ref 0.2–1.2)

## 2017-06-18 LAB — SAMPLE TO BLOOD BANK

## 2017-06-26 ENCOUNTER — Other Ambulatory Visit: Payer: Self-pay | Admitting: Oncology

## 2017-06-26 DIAGNOSIS — C8310 Mantle cell lymphoma, unspecified site: Secondary | ICD-10-CM

## 2017-07-01 ENCOUNTER — Inpatient Hospital Stay: Payer: Medicare HMO | Attending: Oncology | Admitting: Oncology

## 2017-07-01 ENCOUNTER — Inpatient Hospital Stay: Payer: Medicare HMO

## 2017-07-01 VITALS — BP 173/85 | HR 73 | Temp 98.2°F | Resp 20 | Ht 66.0 in | Wt 148.0 lb

## 2017-07-01 DIAGNOSIS — C8311 Mantle cell lymphoma, lymph nodes of head, face, and neck: Secondary | ICD-10-CM | POA: Insufficient documentation

## 2017-07-01 DIAGNOSIS — Z79899 Other long term (current) drug therapy: Secondary | ICD-10-CM | POA: Diagnosis not present

## 2017-07-01 DIAGNOSIS — D630 Anemia in neoplastic disease: Secondary | ICD-10-CM | POA: Insufficient documentation

## 2017-07-01 DIAGNOSIS — Z8673 Personal history of transient ischemic attack (TIA), and cerebral infarction without residual deficits: Secondary | ICD-10-CM

## 2017-07-01 DIAGNOSIS — C61 Malignant neoplasm of prostate: Secondary | ICD-10-CM | POA: Insufficient documentation

## 2017-07-01 LAB — CBC WITH DIFFERENTIAL (CANCER CENTER ONLY)
BASOS ABS: 0 10*3/uL (ref 0.0–0.1)
Basophils Relative: 1 %
Eosinophils Absolute: 0.1 10*3/uL (ref 0.0–0.5)
Eosinophils Relative: 2 %
HEMATOCRIT: 26 % — AB (ref 38.4–49.9)
Hemoglobin: 8.4 g/dL — ABNORMAL LOW (ref 13.0–17.1)
LYMPHS PCT: 31 %
Lymphs Abs: 1.2 10*3/uL (ref 0.9–3.3)
MCH: 29 pg (ref 27.2–33.4)
MCHC: 32.3 g/dL (ref 32.0–36.0)
MCV: 89.9 fL (ref 79.3–98.0)
MONO ABS: 1 10*3/uL — AB (ref 0.1–0.9)
Monocytes Relative: 25 %
NEUTROS ABS: 1.6 10*3/uL (ref 1.5–6.5)
Neutrophils Relative %: 41 %
Platelet Count: 310 10*3/uL (ref 140–400)
RBC: 2.89 MIL/uL — AB (ref 4.20–5.82)
RDW: 17.1 % — AB (ref 11.0–14.6)
WBC Count: 3.8 10*3/uL — ABNORMAL LOW (ref 4.0–10.3)

## 2017-07-01 LAB — CMP (CANCER CENTER ONLY)
ALK PHOS: 57 U/L (ref 40–150)
ALT: 56 U/L — AB (ref 0–55)
AST: 37 U/L — AB (ref 5–34)
Albumin: 2.7 g/dL — ABNORMAL LOW (ref 3.5–5.0)
Anion gap: 7 (ref 3–11)
BILIRUBIN TOTAL: 0.3 mg/dL (ref 0.2–1.2)
BUN: 15 mg/dL (ref 7–26)
CALCIUM: 8.7 mg/dL (ref 8.4–10.4)
CO2: 27 mmol/L (ref 22–29)
CREATININE: 1.1 mg/dL (ref 0.70–1.30)
Chloride: 107 mmol/L (ref 98–109)
GFR, Est AFR Am: >60 mL/min (ref >60)
Glucose, Bld: 109 mg/dL (ref 70–140)
Potassium: 3.8 mmol/L (ref 3.5–5.1)
Sodium: 141 mmol/L (ref 136–145)
TOTAL PROTEIN: 6.6 g/dL (ref 6.4–8.3)

## 2017-07-01 LAB — SAMPLE TO BLOOD BANK

## 2017-07-01 NOTE — Progress Notes (Signed)
Hematology and Oncology Follow Up Visit  Joel [name] [id number] 08/08/1940 77 y.o. 07/01/2017 8:29 AM [name], MDSanford, [name], MD   Principle Diagnosis: 77 year old man with:  1.  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 His Gleason score 3+3 = 6 with periaortic adenopathy and without diagnosis.   2.  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 He did have bone marrow involvement and pancytopenia.   Prior Therapy:   He is status post excisional biopsy of a right neck mass completed on 03/27/2017 with the pathology confirming mantle cell lymphoma.  He is status post palliative radiation therapy to his cervical adenopathy completed in January 2019.   Current therapy:   Imbruvica 560 mg daily in December 2018.  Lupron 30 mg once every 4 months.  Started in January 2019.   Interim History: Joel [name] is here for a follow-up visit. He reports no major changes in his health since the last visit. He received Lupron without any complications related to this medication. He denies any hot flashes or progressive weight gain. He continues to take Imbruvica 560 mg daily without any new side effects. He denies any palpitation, thrombosis or bleeding episodes. Despite these diagnoses, he continues to perform activities of daily living and works part-time. He has not received transfusions January 2019. He is eating well and maintained his weight. He had any excessive fatigue or dyspnea exertion.    He does not report any headaches, blurry vision, syncope or seizures.    He does not report any neuropathy or any neurological deficits.  He does not report any fevers, chills, sweats or appetite changes.  He does not report any chest pain, palpitation, orthopnea or leg edema.  He does not report any cough, wheezing or hemoptysis.  He does not report any nausea, vomiting or abdominal pain.  He does not report any frequency urgency  or hesitancy.  He does not report any hematuria or dysuria.    He does not report any arthralgias, myalgias. He does not report any ecchymosis or bruising.  He does not report any lymphadenopathy, petechiae or easy bruising. Remaining review of systems is negative.  Medications: I have reviewed the patient's current medications.  Current Outpatient Medications  Medication Sig Dispense Refill  . IMBRUVICA 560 MG TABS TAKE 1 TABLET (560 MG) BY MOUTH DAILY. TAKE WITH A FULL GLASS OF WATER AT APPROXIMATELY THE SAME TIME EACH DAY. 28 tablet 0   No current facility-administered medications for this visit.      Allergies: No Known Allergies  Past Medical History, Surgical history, Social history, and Family History discussed today and remained unchanged.  Physical Exam: Blood pressure (!) 173/85, pulse 73, temperature 98.2 F (36.8 C), temperature source Oral, resp. rate 20, height 5\' 6"  (1.676 m), weight 148 lb (67.1 kg), SpO2 99 %. ECOG:1 General appearance: Alert, awake gentleman without distress. Head:   Atraumatic without abnormalities. Oropharynx: No masses or thrush. Eyes: Pupils are equal and round and reactive to light. Lymph nodes: No lymphadenopathy palpated in the cervical, supraclavicular or axillary regions. Heart:regular rate and rhythm, S1, S2 normal, no murmur, click, rub or gallop Lung: Clear in all lung fields without rhonchi or dullness to percussion. Abdomin: Soft, nontender without any rebound or guarding. No shifting dullness or ascites. Musculoskeletal: No joint deformity or effusion. Skin: No ecchymosis or rashes. Neurological: Intact deep tendon reflexes. No motor or sensory deficits.  Lab Results: Lab Results  Component  Value Date   WBC 6.5 06/18/2017   HGB 5.2 (LL) 05/13/2017   HCT 28.6 (L) 06/18/2017   MCV 92.6 06/18/2017   PLT 155 06/18/2017     Chemistry      Component Value Date/Time   NA 139 06/18/2017 1038   NA 141 04/04/2017 1030   K 3.7  06/18/2017 1038   K 4.4 04/04/2017 1030   CL 106 06/18/2017 1038   CO2 25 06/18/2017 1038   CO2 20 (L) 04/04/2017 1030   BUN 25 06/18/2017 1038   BUN 29.2 (H) 04/04/2017 1030   CREATININE 1.34 (H) 06/18/2017 1038   CREATININE 1.7 (H) 04/04/2017 1030      Component Value Date/Time   CALCIUM 9.1 06/18/2017 1038   CALCIUM 8.8 04/04/2017 1030   ALKPHOS 51 06/18/2017 1038   ALKPHOS 101 04/04/2017 1030   AST 27 06/18/2017 1038   AST 31 04/04/2017 1030   ALT 17 06/18/2017 1038   ALT 10 04/04/2017 1030   BILITOT 0.7 06/18/2017 1038   BILITOT 0.56 04/04/2017 1030      Impression and Plan:  77 year old gentleman with the following issues:  1. Stage IIIa Mantle cell lymphoma: He presented with neck adenopathy that is biopsy proven in diagnosed in December of 2018. He has disease in the bone marrow. He started Imbruvica 560 mg daily started in December 2018 because of pancytopenia.  The plan is to continue the same dose and schedule without any reduction given his excellent tolerance and response to therapy. Plan is to repeat imaging studies in June 2019.   2.  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  He received Lupron in January 7322 without complications.   The plan is to continue with Lupron indefinitely at this time. Risks and benefits of continuing this medication was discussed today and he is agreeable.  3.  Anemia: Related to Mantle cell lymphoma and prostate cancer. He has received packed red cell transfusion in the past. His last transfusion was January 2019. His hemoglobin continues to be maintained on does not require transfusion today.  4.  Cerebrovascular accident: No new neurological deficits noted at this time.  5.  Thrombocytopenia: No active bleeding noted at this time. His platelet count is close to normal range.  6.  Follow-up: Have an MD follow-up in 5 weeks.  15 minutes was spent with the patient face-to-face today.  More than 50%  of time was dedicated to patient counseling, education and coordination of his care.       Zola Button, MD 3/11/20198:29 AM

## 2017-07-02 MED FILL — IMBRUVICA 560 MG TAB: 560 | 28 days supply | Qty: 28 | Fill #0

## 2017-07-16 ENCOUNTER — Telehealth: Payer: Self-pay | Admitting: *Deleted

## 2017-07-16 NOTE — Telephone Encounter (Signed)
FYI "Has the attending physician form I dropped off yesterday for Dr. Alen Blew to sign ready today?"    Patient forms provided to [facility] require ten business days.  Will notify provider of the request.  Denies further needs or questions.Marland Kitchen

## 2017-07-17 ENCOUNTER — Encounter: Payer: Self-pay | Admitting: *Deleted

## 2017-07-17 ENCOUNTER — Telehealth: Payer: Self-pay | Admitting: Oncology

## 2017-07-17 NOTE — Telephone Encounter (Signed)
07/17/17 @ 2:45 pm, called and left message informing that FMLA paperwork is complete and ready for pickup at front desk of [place].

## 2017-07-23 ENCOUNTER — Other Ambulatory Visit: Payer: Self-pay | Admitting: Oncology

## 2017-07-23 DIAGNOSIS — C8310 Mantle cell lymphoma, unspecified site: Secondary | ICD-10-CM

## 2017-07-29 MED FILL — IMBRUVICA 560 MG TAB: 560 | 28 days supply | Qty: 28 | Fill #0

## 2017-08-07 ENCOUNTER — Inpatient Hospital Stay: Payer: Medicare HMO

## 2017-08-07 ENCOUNTER — Inpatient Hospital Stay: Payer: Medicare HMO | Attending: Oncology | Admitting: Oncology

## 2017-08-07 ENCOUNTER — Telehealth: Payer: Self-pay

## 2017-08-07 VITALS — BP 164/83 | HR 78 | Temp 98.7°F | Resp 18 | Ht 66.0 in | Wt 141.0 lb

## 2017-08-07 DIAGNOSIS — Z8673 Personal history of transient ischemic attack (TIA), and cerebral infarction without residual deficits: Secondary | ICD-10-CM | POA: Diagnosis not present

## 2017-08-07 DIAGNOSIS — C61 Malignant neoplasm of prostate: Secondary | ICD-10-CM | POA: Insufficient documentation

## 2017-08-07 DIAGNOSIS — R590 Localized enlarged lymph nodes: Secondary | ICD-10-CM | POA: Insufficient documentation

## 2017-08-07 DIAGNOSIS — C8311 Mantle cell lymphoma, lymph nodes of head, face, and neck: Secondary | ICD-10-CM

## 2017-08-07 DIAGNOSIS — Z79818 Long term (current) use of other agents affecting estrogen receptors and estrogen levels: Secondary | ICD-10-CM | POA: Insufficient documentation

## 2017-08-07 DIAGNOSIS — I639 Cerebral infarction, unspecified: Secondary | ICD-10-CM | POA: Diagnosis not present

## 2017-08-07 DIAGNOSIS — Z79899 Other long term (current) drug therapy: Secondary | ICD-10-CM | POA: Diagnosis not present

## 2017-08-07 DIAGNOSIS — Z923 Personal history of irradiation: Secondary | ICD-10-CM | POA: Insufficient documentation

## 2017-08-07 LAB — CBC WITH DIFFERENTIAL (CANCER CENTER ONLY)
BASOS PCT: 4 %
Basophils Absolute: 0.2 10*3/uL — ABNORMAL HIGH (ref 0.0–0.1)
EOS ABS: 0 10*3/uL (ref 0.0–0.5)
Eosinophils Relative: 0 %
HEMATOCRIT: 27.8 % — AB (ref 38.4–49.9)
Hemoglobin: 8.8 g/dL — ABNORMAL LOW (ref 13.0–17.1)
LYMPHS ABS: 1.4 10*3/uL (ref 0.9–3.3)
Lymphocytes Relative: 32 %
MCH: 27.1 pg — AB (ref 27.2–33.4)
MCHC: 31.7 g/dL — AB (ref 32.0–36.0)
MCV: 85.5 fL (ref 79.3–98.0)
MONO ABS: 1.1 10*3/uL — AB (ref 0.1–0.9)
MONOS PCT: 24 %
Neutro Abs: 1.8 10*3/uL (ref 1.5–6.5)
Neutrophils Relative %: 40 %
Platelet Count: 170 10*3/uL (ref 140–400)
RBC: 3.25 MIL/uL — ABNORMAL LOW (ref 4.20–5.82)
RDW: 15.6 % — AB (ref 11.0–14.6)
WBC Count: 4.5 10*3/uL (ref 4.0–10.3)

## 2017-08-07 LAB — CMP (CANCER CENTER ONLY)
ALBUMIN: 3.2 g/dL — AB (ref 3.5–5.0)
ALT: 10 U/L (ref 0–55)
ANION GAP: 6 (ref 3–11)
AST: 18 U/L (ref 5–34)
Alkaline Phosphatase: 57 U/L (ref 40–150)
BUN: 16 mg/dL (ref 7–26)
CALCIUM: 9.2 mg/dL (ref 8.4–10.4)
CO2: 26 mmol/L (ref 22–29)
Chloride: 107 mmol/L (ref 98–109)
Creatinine: 1.26 mg/dL (ref 0.70–1.30)
GFR, EST NON AFRICAN AMERICAN: 54 mL/min — AB (ref >60)
GLUCOSE: 88 mg/dL (ref 70–140)
POTASSIUM: 3.7 mmol/L (ref 3.5–5.1)
Sodium: 139 mmol/L (ref 136–145)
Total Bilirubin: 0.5 mg/dL (ref 0.2–1.2)
Total Protein: 7.5 g/dL (ref 6.4–8.3)

## 2017-08-07 LAB — SAMPLE TO BLOOD BANK

## 2017-08-07 NOTE — Telephone Encounter (Signed)
Printed avs and calender of upcoming appointment. Per 4/17 los 

## 2017-08-07 NOTE — Progress Notes (Signed)
Hematology and Oncology Follow Up Visit  Joel [name] [id number] 1940-07-16 77 y.o. 08/07/2017 2:06 PM Joel [name], MDSanford, Joel Leeds, MD   Principle Diagnosis: 77 year old man with:  1.  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 His Gleason score 3+3 = 6   2.  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 He did have bone marrow involvement and pancytopenia.   Prior Therapy:   He is status post excisional biopsy of a right neck mass completed on 03/27/2017 with the pathology confirming mantle cell lymphoma.  He is status post palliative radiation therapy to his cervical adenopathy completed in January 2019.   Current therapy:   Imbruvica 560 mg daily in December 2018.  Lupron 30 mg once every 4 months.  Started in January 2019.   Interim History: Joel [name] presents today for a follow-up visit.  He reports continuous improvement in his health since the last visit.  He denies any excessive fatigue, tiredness or dyspnea on exertion.  His appetite remained reasonable although he lost weight.  He continues to take Imbruvica without new complications.  He denies any bleeding or recent thrombosis.  Continues to attend to activities of daily living including working part-time.   He does not report any headaches, blurry vision, syncope or seizures.  He does not report any fevers, chills, sweats or appetite changes.  He does not report any chest pain, palpitation, orthopnea or leg edema.  He does not report any cough, wheezing or hemoptysis.  He does not report any nausea, vomiting or abdominal pain.  He does not report any frequency urgency or hesitancy.   He does not report any arthralgias, myalgias. He does not report any ecchymosis or bruising.  He does not report any lymphadenopathy, petechiae or easy bruising.  He does not report any anxiety or depression.  Remaining review of systems is  negative.  Medications: I have reviewed the patient's current medications.  Current Outpatient Medications  Medication Sig Dispense Refill  . IMBRUVICA 560 MG TABS TAKE 1 TABLET (560 MG) BY MOUTH DAILY. TAKE WITH A FULL GLASS OF WATER AT APPROXIMATELY THE SAME TIME EACH DAY. 28 tablet 0   No current facility-administered medications for this visit.      Allergies: No Known Allergies  Past Medical History, Surgical history, Social history, and Family History discussed today and remained unchanged.  Physical Exam: Blood pressure (!) 164/83, pulse 78, temperature 98.7 F (37.1 C), temperature source Oral, resp. rate 18, height 5\' 6"  (1.676 m), weight 141 lb (64 kg), SpO2 100 %.   ECOG:1 General appearance: Comfortable appearing gentleman without distress. Head:   Normocephalic without abnormalities. Oropharynx: Mucous membranes are moist and pink. Eyes: Extraocular muscles intact. Lymph nodes: Cervical, supraclavicular and axillary lymph nodes are normal. Heart: Regular rate and rhythm without any murmurs or gallops. Lung: Clear to auscultation without any rhonchi, wheezes or dullness to percussion. Abdomin: Soft, nontender without any rebound or guarding.  Good bowel sounds. Musculoskeletal: No clubbing or cyanosis.  Full range of motion noted. Skin: No petechia or rash. Neurological: Intact exam with no motor or sensory deficits.  Lab Results: Lab Results  Component Value Date   WBC 3.8 (L) 07/01/2017   HGB 5.2 (LL) 05/13/2017   HCT 26.0 (L) 07/01/2017   MCV 89.9 07/01/2017   PLT 310 07/01/2017     Chemistry      Component Value Date/Time   NA 141 07/01/2017 0758  NA 141 04/04/2017 1030   K 3.8 07/01/2017 0758   K 4.4 04/04/2017 1030   CL 107 07/01/2017 0758   CO2 27 07/01/2017 0758   CO2 20 (L) 04/04/2017 1030   BUN 15 07/01/2017 0758   BUN 29.2 (H) 04/04/2017 1030   CREATININE 1.10 07/01/2017 0758   CREATININE 1.7 (H) 04/04/2017 1030      Component Value  Date/Time   CALCIUM 8.7 07/01/2017 0758   CALCIUM 8.8 04/04/2017 1030   ALKPHOS 57 07/01/2017 0758   ALKPHOS 101 04/04/2017 1030   AST 37 (H) 07/01/2017 0758   AST 31 04/04/2017 1030   ALT 56 (H) 07/01/2017 0758   ALT 10 04/04/2017 1030   BILITOT 0.3 07/01/2017 0758   BILITOT 0.56 04/04/2017 1030      Impression and Plan:  77 year old gentleman with the following issues:  1. Stage IIIA Mantle cell lymphoma diagnosed in December 2018: He started Imbruvica 560 mg daily without any recent complications.  He continues to tolerate this medication well with excellent response based on his blood counts.  He has no palpable adenopathy.  Risks and benefits of continuing this medication was reviewed today he is agreeable to continue.     2.  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  He has pelvic adenopathy and currently receiving Lupron.  He will continue to receive that indefinitely.  His next injection will be after Sep 10, 2017.  We will continue to monitor his PSA periodically with his last PSA in January 2019 was 301.   3.  Anemia:  His hemoglobin continues to improve and does not require any transfusion at this time.   4..  Thrombocytopenia: Has normalized at this time.  His thrombocytopenia is related to mantle cell lymphoma which is adequately treated.  6.  Follow-up:  In 5 weeks.  15 minutes was spent with the patient face-to-face today.  More than 50% of time was dedicated to patient counseling, education and coordination of his care.       Zola Button, MD 4/17/20192:06 PM

## 2017-08-09 ENCOUNTER — Other Ambulatory Visit: Payer: Self-pay | Admitting: Pharmacist

## 2017-08-19 ENCOUNTER — Telehealth: Payer: Self-pay | Admitting: Internal Medicine

## 2017-08-19 ENCOUNTER — Other Ambulatory Visit: Payer: Self-pay | Admitting: Oncology

## 2017-08-19 ENCOUNTER — Inpatient Hospital Stay (HOSPITAL_BASED_OUTPATIENT_CLINIC_OR_DEPARTMENT_OTHER): Payer: Medicare HMO | Admitting: Internal Medicine

## 2017-08-19 ENCOUNTER — Other Ambulatory Visit: Payer: Self-pay

## 2017-08-19 ENCOUNTER — Encounter: Payer: Self-pay | Admitting: Internal Medicine

## 2017-08-19 VITALS — BP 152/78 | HR 87 | Temp 98.2°F | Resp 18 | Ht 66.0 in | Wt 145.0 lb

## 2017-08-19 DIAGNOSIS — Z923 Personal history of irradiation: Secondary | ICD-10-CM | POA: Diagnosis not present

## 2017-08-19 DIAGNOSIS — C8311 Mantle cell lymphoma, lymph nodes of head, face, and neck: Secondary | ICD-10-CM

## 2017-08-19 DIAGNOSIS — I639 Cerebral infarction, unspecified: Secondary | ICD-10-CM

## 2017-08-19 DIAGNOSIS — I6381 Other cerebral infarction due to occlusion or stenosis of small artery: Secondary | ICD-10-CM

## 2017-08-19 DIAGNOSIS — Z8673 Personal history of transient ischemic attack (TIA), and cerebral infarction without residual deficits: Secondary | ICD-10-CM

## 2017-08-19 DIAGNOSIS — R590 Localized enlarged lymph nodes: Secondary | ICD-10-CM | POA: Diagnosis not present

## 2017-08-19 DIAGNOSIS — C61 Malignant neoplasm of prostate: Secondary | ICD-10-CM | POA: Diagnosis not present

## 2017-08-19 DIAGNOSIS — C8310 Mantle cell lymphoma, unspecified site: Secondary | ICD-10-CM

## 2017-08-19 DIAGNOSIS — Z79818 Long term (current) use of other agents affecting estrogen receptors and estrogen levels: Secondary | ICD-10-CM | POA: Diagnosis not present

## 2017-08-19 NOTE — Telephone Encounter (Signed)
Appointments scheduled AVS/Calendar printed per 4/29 los °

## 2017-08-19 NOTE — Progress Notes (Signed)
[facility] [place] [place], [place] [address] [phone]   Interval Evaluation  Date of Service: 08/19/17 Patient Name: Joel [name] Patient MRN: [id number] Patient DOB: [birth year] Provider: Ventura Sellers, MD  Identifying Statement:  Joel [name] is a 77 y.o. 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  He presented with urinary retention and a PSA of 95.5.  He underwent a TURP procedure which showed a prostate cancer Gleason score 3+3 = 6. Fluciclovinescan in October 2017 which showed bulky periaortic and retroperitoneal lymph nodes consistent with prostate cancer.   2.  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  This was confirmed in December 2018.  He is status post excisional biopsy of a right neck mass completed on 03/27/2017.  Started Imbruvica 12/18  Interval History:  Joel [name] returns for follow up today.  He again describes no new or progressive neurologic deficits.  He continues to work full time at the [employer].  No issues with his Imbruvica, hasn't required a transfusion since January.  He also denies seizures, headaches.  Medications: No current outpatient medications on file prior to visit.   No current facility-administered medications on file prior to visit.     Allergies: No Known Allergies Past Medical History:  Past Medical History:  Diagnosis Date  . Anemia   . Aneurysm ([place])    in 1987, brain aneurysm  . Bilateral hydronephrosis   . History of radiation therapy 04/17/17- 05/07/17   Right neck treated to 32.5 Gy with 13 fx of 2.5 Gy  . Mass of right side of neck   . Pneumonia    hx of   . Urinary  retention    Past Surgical History:  Past Surgical History:  Procedure Laterality Date  . MASS BIOPSY Right 03/27/2017   Procedure: OPEN NECK MASS BIOPSY OF THE RIGHT NECK;  Surgeon: Leta Baptist, MD;  Location: [facility];  Service: ENT;  Laterality: Right;  . PROSTATE BIOPSY N/A 07/30/2015   Procedure: PROSTATE BIOPSY;  Surgeon: Irine Seal, MD;  Location: [facility];  Service: Urology;  Laterality: N/A;  . TRANSURETHRAL RESECTION OF PROSTATE N/A 07/30/2015   Procedure: TRANSURETHRAL RESECTION OF THE PROSTATE (TURP);  Surgeon: Irine Seal, MD;  Location: [facility];  Service: Urology;  Laterality: N/A;   Social History:  Social History   Socioeconomic History  . Marital status: Married    Spouse name: Not on file  . Number of children: Not on file  . Years of education: Not on file  . Highest education level: Not on file  Occupational History  . Not on file  Social Needs  . Financial resource strain: Not on file  . Food insecurity:    Worry: Not on file    Inability: Not on file  . Transportation needs:    Medical: Not on file    Non-medical: Not on file  Tobacco Use  . Smoking status: Former Smoker    Last attempt to quit: 04/28/1985    Years since quitting: 32.3  . Smokeless tobacco: Former Systems developer    Types: Chew  . Tobacco comment: quit 30-40 yrs. aog  Substance and Sexual Activity  . Alcohol  use: No    Alcohol/week: 0.0 oz  . Drug use: No  . Sexual activity: Not on file  Lifestyle  . Physical activity:    Days per week: Not on file    Minutes per session: Not on file  . Stress: Not on file  Relationships  . Social connections:    Talks on phone: Not on file    Gets together: Not on file    Attends religious service: Not on file    Active member of club or organization: Not on file    Attends meetings of clubs or organizations: Not on file    Relationship status: Not on file  . Intimate partner violence:    Fear of current or ex partner: Not on file    Emotionally abused: Not on  file    Physically abused: Not on file    Forced sexual activity: Not on file  Other Topics Concern  . Not on file  Social History Narrative  . Not on file   Family History:  Family History  Problem Relation Age of Onset  . Dementia Mother   . Heart disease Father   . Lung cancer Brother   . Lupus Sister     Review of Systems: Constitutional: Denies fevers, chills or abnormal weight loss Eyes: Denies blurriness of vision Ears, nose, mouth, throat, and face: Denies mucositis or sore throat Respiratory: Denies cough, dyspnea or wheezes Cardiovascular: Denies palpitation, chest discomfort or lower extremity swelling Gastrointestinal:  Denies nausea, constipation, diarrhea GU: Denies dysuria or incontinence Skin: Denies abnormal skin rashes Neurological: Per HPI Musculoskeletal: Denies joint pain, back or neck discomfort. No decrease in ROM Behavioral/Psych: Denies anxiety, disturbance in thought content, and mood instability   Physical Exam: Vitals:   08/19/17 1331  BP: (!) 152/78  Pulse: 87  Resp: 18  Temp: 98.2 F (36.8 C)  SpO2: 99%   KPS: 80. General: Alert, cooperative, pleasant, in no acute distress Head: Normal EENT: Soft tissue mass right neck.  Lungs: Resp effort normal Cardiac: Regular rate and rhythm Abdomen: Soft, non-distended abdomen Skin: No rashes cyanosis or petechiae. Extremities: No clubbing or edema  Neurologic Exam: Mental Status: Awake, alert, attentive to examiner. Oriented to self and environment. Language is fluent with intact comprehension.  Cranial Nerves: Visual acuity is grossly normal. Visual fields are full. Extra-ocular movements intact. No ptosis. Face is symmetric, tongue midline. Motor: Tone and bulk are normal. Power is full in both arms and legs. Reflexes are symmetric, no pathologic reflexes present. Intact finger to nose bilaterally Sensory: Intact to light touch and temperature Gait: Normal and tandem gait is  normal.   Labs: I have reviewed the data as listed    Component Value Date/Time   NA 139 08/07/2017 1354   NA 141 04/04/2017 1030   K 3.7 08/07/2017 1354   K 4.4 04/04/2017 1030   CL 107 08/07/2017 1354   CO2 26 08/07/2017 1354   CO2 20 (L) 04/04/2017 1030   GLUCOSE 88 08/07/2017 1354   GLUCOSE 97 04/04/2017 1030   BUN 16 08/07/2017 1354   BUN 29.2 (H) 04/04/2017 1030   CREATININE 1.26 08/07/2017 1354   CREATININE 1.7 (H) 04/04/2017 1030   CALCIUM 9.2 08/07/2017 1354   CALCIUM 8.8 04/04/2017 1030   PROT 7.5 08/07/2017 1354   PROT 7.7 04/12/2017 1423   PROT 8.0 04/04/2017 1030   ALBUMIN 3.2 (L) 08/07/2017 1354   ALBUMIN 3.2 (L) 04/04/2017 1030   AST 18 08/07/2017 1354  AST 31 04/04/2017 1030   ALT 10 08/07/2017 1354   ALT 10 04/04/2017 1030   ALKPHOS 57 08/07/2017 1354   ALKPHOS 101 04/04/2017 1030   BILITOT 0.5 08/07/2017 1354   BILITOT 0.56 04/04/2017 1030   GFRNONAA 54 (L) 08/07/2017 1354   GFRNONAA 6 (L) 07/05/2015 1745   GFRAA >60 08/07/2017 1354   GFRAA 7 (L) 07/05/2015 1745   Lab Results  Component Value Date   WBC 4.5 08/07/2017   NEUTROABS 1.8 08/07/2017   HGB 8.8 (L) 08/07/2017   HCT 27.8 (L) 08/07/2017   MCV 85.5 08/07/2017   PLT 170 08/07/2017     Assessment/Plan 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  The likely mechanism of infarct is small vessel based on radiographic appearance and sub-clinical presentation.    We again discussed his high risk for stroke given his established vascular disease burden in the brain, his carotid endothelial exposure to radiation, occurrences of very low hemoglobin, as well as poor diet and not ideally controlled blood pressure.  We recommended initiating daily therapy with Aspirin 81mg  for secondary stroke prevention given his improved blood counts and high risk factors detailed above.  CTA/MRA will be deferred at this time because he would be  poor candidate for endarterectomy, stent or anticoagulation.    Recommend closer follow up with PCP for blood pressure monitoring.    We appreciate the opportunity to participate in the care of Joel [name].  He should return to clinic in 6 months for further evaluation, or sooner if neurologic symptoms develop.  All questions were answered. The patient knows to call the clinic with any problems, questions or concerns. No barriers to learning were detected.  The total time spent in the encounter was 25 minutes and more than 50% was on counseling and review of test results   Ventura Sellers, MD Medical Director of Neuro-Oncology [facility] at [place] 08/19/17 1:12 PM

## 2017-08-23 MED FILL — IMBRUVICA 560 MG TAB: 560 | 28 days supply | Qty: 28 | Fill #0

## 2017-09-12 ENCOUNTER — Telehealth: Payer: Self-pay | Admitting: Oncology

## 2017-09-12 ENCOUNTER — Inpatient Hospital Stay: Payer: Medicare HMO

## 2017-09-12 ENCOUNTER — Inpatient Hospital Stay: Payer: Medicare HMO | Attending: Oncology | Admitting: Oncology

## 2017-09-12 VITALS — BP 154/77 | HR 83 | Temp 98.6°F | Resp 18 | Ht 66.0 in | Wt 148.4 lb

## 2017-09-12 DIAGNOSIS — C8311 Mantle cell lymphoma, lymph nodes of head, face, and neck: Secondary | ICD-10-CM | POA: Diagnosis present

## 2017-09-12 DIAGNOSIS — Z923 Personal history of irradiation: Secondary | ICD-10-CM | POA: Diagnosis not present

## 2017-09-12 DIAGNOSIS — Z5111 Encounter for antineoplastic chemotherapy: Secondary | ICD-10-CM | POA: Insufficient documentation

## 2017-09-12 DIAGNOSIS — D630 Anemia in neoplastic disease: Secondary | ICD-10-CM | POA: Insufficient documentation

## 2017-09-12 DIAGNOSIS — C61 Malignant neoplasm of prostate: Secondary | ICD-10-CM | POA: Insufficient documentation

## 2017-09-12 LAB — CMP (CANCER CENTER ONLY)
ALBUMIN: 3.3 g/dL — AB (ref 3.5–5.0)
ALK PHOS: 61 U/L (ref 40–150)
ALT: 16 U/L (ref 0–55)
AST: 19 U/L (ref 5–34)
Anion gap: 6 (ref 3–11)
BUN: 21 mg/dL (ref 7–26)
CALCIUM: 8.8 mg/dL (ref 8.4–10.4)
CHLORIDE: 109 mmol/L (ref 98–109)
CO2: 25 mmol/L (ref 22–29)
CREATININE: 1.38 mg/dL — AB (ref 0.70–1.30)
GFR, Est AFR Am: 57 mL/min — ABNORMAL LOW (ref >60)
GFR, Estimated: 49 mL/min — ABNORMAL LOW (ref >60)
GLUCOSE: 117 mg/dL (ref 70–140)
Potassium: 3.7 mmol/L (ref 3.5–5.1)
Sodium: 140 mmol/L (ref 136–145)
Total Bilirubin: 0.2 mg/dL (ref 0.2–1.2)
Total Protein: 7.3 g/dL (ref 6.4–8.3)

## 2017-09-12 LAB — CBC WITH DIFFERENTIAL (CANCER CENTER ONLY)
BASOS ABS: 0 10*3/uL (ref 0.0–0.1)
BASOS PCT: 1 %
Eosinophils Absolute: 0.1 10*3/uL (ref 0.0–0.5)
Eosinophils Relative: 2 %
HEMATOCRIT: 24.5 % — AB (ref 38.4–49.9)
HEMOGLOBIN: 7.6 g/dL — AB (ref 13.0–17.1)
LYMPHS PCT: 27 %
Lymphs Abs: 1.1 10*3/uL (ref 0.9–3.3)
MCH: 26.3 pg — ABNORMAL LOW (ref 27.2–33.4)
MCHC: 31 g/dL — ABNORMAL LOW (ref 32.0–36.0)
MCV: 84.8 fL (ref 79.3–98.0)
MONO ABS: 0.6 10*3/uL (ref 0.1–0.9)
Monocytes Relative: 15 %
NEUTROS ABS: 2.3 10*3/uL (ref 1.5–6.5)
NEUTROS PCT: 55 %
Platelet Count: 209 10*3/uL (ref 140–400)
RBC: 2.89 MIL/uL — AB (ref 4.20–5.82)
RDW: 16.7 % — ABNORMAL HIGH (ref 11.0–14.6)
WBC: 4.2 10*3/uL (ref 4.0–10.3)

## 2017-09-12 MED ORDER — LEUPROLIDE ACETATE (4 MONTH) 30 MG IM KIT
30.0000 mg | PACK | Freq: Once | INTRAMUSCULAR | Status: AC
  Administered 2017-09-12: 30 mg via INTRAMUSCULAR
  Filled 2017-09-12: qty 30

## 2017-09-12 NOTE — Telephone Encounter (Signed)
Appointments scheduled AVS/Calendar printed per 5/23 los °

## 2017-09-12 NOTE — Patient Instructions (Signed)

## 2017-09-12 NOTE — Progress Notes (Signed)
Hematology and Oncology Follow Up Visit  Joel [name] [id number] Sep 05, 1940 77 y.o. 09/12/2017 3:14 PM [name], MDSanford, [name], MD   Principle Diagnosis: 77 year old man with:  1.  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   2.  Dunbar diagnosed in December 2018 with the stage IIIA disease.    Prior Therapy:   He is status post excisional biopsy of a right neck mass completed on 03/27/2017 with the pathology confirming mantle cell lymphoma.  He is status post palliative radiation therapy to his cervical adenopathy completed in January 2019.   Current therapy:   Imbruvica 560 mg daily in December 2018.  Lupron 30 mg once every 4 months.  Started in January 2019.   Interim History: Mr. Near presents today for a follow-up visit.  Since the last visit, he reports no major changes in his health.  He denies any excessive fatigue or tiredness, lymphadenopathy or weakness.  He continues to take Imbruvica without any complications related to this medication.  He denies any easy bruising or any bleeding symptoms.  He denies any recent hospitalizations or illnesses.  He needs to work periodically and able to perform work-related duties.   He does not report any headaches, blurry vision, syncope or seizures.  He does not report any fevers, chills, sweats.  Appetite and performance status remains excellent.  He does not report any chest pain, palpitation, orthopnea or leg edema.  He does not report any cough, wheezing or hemoptysis.  He does not report any nausea, vomiting or abdominal pain.  He does not report any frequency urgency or hesitancy.   He does not report any arthralgias, myalgias.  He does not report any lymphadenopathy, petechiae or easy bruising.  He does not report any heat or cold intolerance.  Remaining review of systems is negative.  Medications: I have reviewed the patient's current medications.   Current Outpatient Medications  Medication Sig Dispense Refill  . IMBRUVICA 560 MG TABS TAKE 1 TABLET (560 MG) BY MOUTH DAILY. TAKE WITH A FULL GLASS OF WATER AT APPROXIMATELY THE SAME TIME EACH DAY. 28 tablet 0   No current facility-administered medications for this visit.      Allergies: No Known Allergies  Past Medical History, Surgical history, Social history, and Family History discussed today and remained unchanged.  Physical Exam: Blood pressure (!) 154/77, pulse 83, temperature 98.6 F (37 C), temperature source Oral, resp. rate 18, height 5\' 6"  (1.676 m), weight 148 lb 6.4 oz (67.3 kg), SpO2 98 %.   ECOG:1 General appearance: Alert, awake gentleman without distress. Head: Atraumatic without abnormalities. Oropharynx: No oral thrush or ulcers. Eyes: Pupils are equal and round reactive to light. Lymph nodes: No lymphadenopathy noted in the cervical, supraclavicular and axillary lymph nodes  Heart: Regular rate and rhythm without any murmurs. Lung: Clear in all lung fields without any rhonchi or wheezes. Abdomin: Soft, without any rebound or guarding.  No shifting dullness or ascites. Musculoskeletal: No joint deformity or effusion. Skin: No ecchymosis or petechiae. Neurological: No deficits noted his motor or sensory exam.  Lab Results: Lab Results  Component Value Date   WBC 4.2 09/12/2017   HGB 7.6 (L) 09/12/2017   HCT 24.5 (L) 09/12/2017   MCV 84.8 09/12/2017   PLT 209 09/12/2017     Chemistry      Component Value Date/Time   NA 139 08/07/2017 1354   NA 141 04/04/2017 1030   K 3.7 08/07/2017 1354  K 4.4 04/04/2017 1030   CL 107 08/07/2017 1354   CO2 26 08/07/2017 1354   CO2 20 (L) 04/04/2017 1030   BUN 16 08/07/2017 1354   BUN 29.2 (H) 04/04/2017 1030   CREATININE 1.26 08/07/2017 1354   CREATININE 1.7 (H) 04/04/2017 1030      Component Value Date/Time   CALCIUM 9.2 08/07/2017 1354   CALCIUM 8.8 04/04/2017 1030   ALKPHOS 57 08/07/2017 1354    ALKPHOS 101 04/04/2017 1030   AST 18 08/07/2017 1354   AST 31 04/04/2017 1030   ALT 10 08/07/2017 1354   ALT 10 04/04/2017 1030   BILITOT 0.5 08/07/2017 1354   BILITOT 0.56 04/04/2017 1030      Impression and Plan:  77 year old man with:  1. 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    He remains in [place] without any complications at this time.  Risks and benefits of continuing this medication was reviewed today.  Long-term complications including cardiac arrhythmias and bleeding were reviewed.  He is experiencing excellent clinical benefit and the plan is to continue with the same dose and schedule.     2.  Castration-sensitive advanced prostate cancer : He is currently on androgen deprivation and will receive Lupron every 4 months.  Long-term complication associated with this medication was reviewed today is agreeable to continue.   3.  Anemia:  His hemoglobin slightly decreased at this time as he is symptomatic.  His anemia is related to his lymphoma and disease treatments.  We will continue to monitor periodically and transfuse as needed.  4.  Prognosis: He is doing curable malignancies at this time.  Treatment is palliative in nature.  His performance status remains excellent and aggressive therapy is warranted.  5.  Follow-up:  In 2 months follow his progress.  15 minutes was spent with the patient face-to-face today.  More than 50% of time was dedicated to patient counseling, education and discussing diagnosis, prognosis and future plan of care.       Zola Button, MD 5/23/20193:14 PM

## 2017-09-13 ENCOUNTER — Other Ambulatory Visit: Payer: Self-pay | Admitting: Oncology

## 2017-09-13 DIAGNOSIS — C8310 Mantle cell lymphoma, unspecified site: Secondary | ICD-10-CM

## 2017-09-13 LAB — PROSTATE-SPECIFIC AG, SERUM (LABCORP): PROSTATE SPECIFIC AG, SERUM: 4.8 ng/mL — AB (ref 0.0–4.0)

## 2017-09-18 MED FILL — IMBRUVICA 560 MG TAB: 560 | 28 days supply | Qty: 28 | Fill #0

## 2017-10-09 ENCOUNTER — Other Ambulatory Visit: Payer: Self-pay | Admitting: Oncology

## 2017-10-09 DIAGNOSIS — C8310 Mantle cell lymphoma, unspecified site: Secondary | ICD-10-CM

## 2017-10-11 MED FILL — IMBRUVICA 560 MG TAB: 560 | 28 days supply | Qty: 28 | Fill #0

## 2017-11-01 ENCOUNTER — Other Ambulatory Visit: Payer: Self-pay | Admitting: Oncology

## 2017-11-01 DIAGNOSIS — C8310 Mantle cell lymphoma, unspecified site: Secondary | ICD-10-CM

## 2017-11-06 MED FILL — IMBRUVICA 560 MG TAB: 560 | 28 days supply | Qty: 28 | Fill #0

## 2017-11-27 ENCOUNTER — Other Ambulatory Visit: Payer: Self-pay | Admitting: Oncology

## 2017-11-27 DIAGNOSIS — C8310 Mantle cell lymphoma, unspecified site: Secondary | ICD-10-CM

## 2017-12-03 MED FILL — IMBRUVICA 560 MG TAB: 560 | 28 days supply | Qty: 28 | Fill #0

## 2017-12-18 ENCOUNTER — Inpatient Hospital Stay: Payer: Medicare HMO | Admitting: Oncology

## 2017-12-18 ENCOUNTER — Inpatient Hospital Stay: Payer: Medicare HMO | Attending: Oncology

## 2017-12-24 ENCOUNTER — Inpatient Hospital Stay (HOSPITAL_BASED_OUTPATIENT_CLINIC_OR_DEPARTMENT_OTHER): Payer: Medicare HMO | Admitting: Oncology

## 2017-12-24 ENCOUNTER — Telehealth: Payer: Self-pay | Admitting: Oncology

## 2017-12-24 ENCOUNTER — Inpatient Hospital Stay: Payer: Medicare HMO | Attending: Oncology

## 2017-12-24 ENCOUNTER — Other Ambulatory Visit: Payer: Self-pay | Admitting: Oncology

## 2017-12-24 VITALS — BP 175/92 | HR 74 | Temp 98.2°F | Resp 18 | Ht 66.0 in | Wt 152.9 lb

## 2017-12-24 DIAGNOSIS — Z923 Personal history of irradiation: Secondary | ICD-10-CM | POA: Insufficient documentation

## 2017-12-24 DIAGNOSIS — C61 Malignant neoplasm of prostate: Secondary | ICD-10-CM | POA: Insufficient documentation

## 2017-12-24 DIAGNOSIS — Z79899 Other long term (current) drug therapy: Secondary | ICD-10-CM | POA: Diagnosis not present

## 2017-12-24 DIAGNOSIS — C8311 Mantle cell lymphoma, lymph nodes of head, face, and neck: Secondary | ICD-10-CM | POA: Diagnosis not present

## 2017-12-24 DIAGNOSIS — C8310 Mantle cell lymphoma, unspecified site: Secondary | ICD-10-CM

## 2017-12-24 LAB — CMP (CANCER CENTER ONLY)
ALBUMIN: 3.5 g/dL (ref 3.5–5.0)
ALK PHOS: 64 U/L (ref 38–126)
ALT: 10 U/L (ref 0–44)
AST: 15 U/L (ref 15–41)
Anion gap: 6 (ref 5–15)
BILIRUBIN TOTAL: 0.2 mg/dL — AB (ref 0.3–1.2)
BUN: 21 mg/dL (ref 8–23)
CALCIUM: 9.2 mg/dL (ref 8.9–10.3)
CO2: 27 mmol/L (ref 22–32)
Chloride: 107 mmol/L (ref 98–111)
Creatinine: 1.26 mg/dL — ABNORMAL HIGH (ref 0.61–1.24)
GFR, EST NON AFRICAN AMERICAN: 54 mL/min — AB (ref >60)
GFR, Est AFR Am: >60 mL/min (ref >60)
Glucose, Bld: 86 mg/dL (ref 70–99)
POTASSIUM: 4 mmol/L (ref 3.5–5.1)
Sodium: 140 mmol/L (ref 135–145)
TOTAL PROTEIN: 7.4 g/dL (ref 6.5–8.1)

## 2017-12-24 LAB — CBC WITH DIFFERENTIAL (CANCER CENTER ONLY)
BASOS ABS: 0.1 10*3/uL (ref 0.0–0.1)
BASOS PCT: 1 %
Eosinophils Absolute: 0.2 10*3/uL (ref 0.0–0.5)
Eosinophils Relative: 4 %
HEMATOCRIT: 29.4 % — AB (ref 38.4–49.9)
HEMOGLOBIN: 9.4 g/dL — AB (ref 13.0–17.1)
LYMPHS PCT: 28 %
Lymphs Abs: 1.4 10*3/uL (ref 0.9–3.3)
MCH: 26.2 pg — ABNORMAL LOW (ref 27.2–33.4)
MCHC: 32 g/dL (ref 32.0–36.0)
MCV: 81.8 fL (ref 79.3–98.0)
MONO ABS: 0.6 10*3/uL (ref 0.1–0.9)
MONOS PCT: 12 %
NEUTROS PCT: 55 %
Neutro Abs: 2.7 10*3/uL (ref 1.5–6.5)
Platelet Count: 189 10*3/uL (ref 140–400)
RBC: 3.6 MIL/uL — ABNORMAL LOW (ref 4.20–5.82)
RDW: 15.9 % — ABNORMAL HIGH (ref 11.0–14.6)
WBC Count: 5 10*3/uL (ref 4.0–10.3)

## 2017-12-24 LAB — SAMPLE TO BLOOD BANK

## 2017-12-24 NOTE — Telephone Encounter (Signed)
Spoke to pt regarding upcoming appts. Ok per [employer].

## 2017-12-24 NOTE — Telephone Encounter (Signed)
Scheduled appt per 9/3 los -gave patient AVS and calender per los.  

## 2017-12-24 NOTE — Progress Notes (Signed)
Hematology and Oncology Follow Up Visit  Joel [name] [id number] 1940/05/04 77 y.o. 12/24/2017 3:16 PM Joel [name], MDSanford, Joel Leeds, MD   Principle Diagnosis: 77 year old man with:  1.  Advanced prostate cancer that is castration-sensitive  with periaortic adenopathy diagnosed in April 2017.   2.  Stage IIIA mantle cell lymphoma diagnosed in December 2018.    Prior Therapy:   He is status post excisional biopsy of a right neck mass completed on 03/27/2017 with the pathology confirming mantle cell lymphoma.  He is status post palliative radiation therapy to his cervical adenopathy completed in January 2019.   Current therapy:   Imbruvica 560 mg daily in December 2018.  Lupron 30 mg once every 4 months since January 2019.   Interim History: Joel [name] is here for a follow-up visit.  Since last visit, he reports no major changes in his health.  He continues to feel well and enjoy excellent quality of life.  He does not report any lymphadenopathy or constitutional symptoms.  He denies any excessive fatigue or tiredness.  He continues to tolerate Imbruvica without any recent complaints.  He denies any dysphasia or odontophagia.  No recent hospitalizations or illnesses.  He does not report any headaches, blurry vision, syncope or seizures.  He denies any dizziness or alteration in mental status.  He does not report any fevers, chills, sweats.   He does not report any chest pain, palpitation, orthopnea or leg edema.  He does not report any cough, wheezing or hemoptysis.  He does not report any nausea, vomiting or abdominal pain.    He denies any changes in bowel habits.  He does not report any frequency urgency or hesitancy.   He does not report any bone pain or pathological fractures.  He does not report any lymphadenopathy, petechiae or easy bruising.  Remaining review of systems is negative.  Medications: I have reviewed the patient's current medications.  Current Outpatient  Medications  Medication Sig Dispense Refill  . IMBRUVICA 560 MG TABS TAKE 1 TABLET (560 MG) BY MOUTH DAILY. TAKE WITH A FULL GLASS OF WATER AT APPROXIMATELY THE SAME TIME EACH DAY. 28 tablet 0   No current facility-administered medications for this visit.      Allergies: No Known Allergies  Past Medical History, Surgical history, Social history, and Family History discussed today and remained unchanged.  Physical Exam: Blood pressure (!) 175/92, pulse 74, temperature 98.2 F (36.8 C), temperature source Oral, resp. rate 18, height 5\' 6"  (1.676 m), weight 152 lb 14.4 oz (69.4 kg), SpO2 100 %.   ECOG:1   General appearance: Comfortable appearing without any discomfort Head: Normocephalic without any trauma Oropharynx: Mucous membranes are moist and pink without any thrush or ulcers. Eyes: Pupils are equal and round reactive to light. Lymph nodes: No cervical, supraclavicular, inguinal or axillary lymphadenopathy.   Heart:regular rate and rhythm.  S1 and S2 without leg edema. Lung: Clear without any rhonchi or wheezes.  No dullness to percussion. Abdomin: Soft, nontender, nondistended with good bowel sounds.  No hepatosplenomegaly. Musculoskeletal: No joint deformity or effusion.  Full range of motion noted. Neurological: No deficits noted on motor, sensory and deep tendon reflex exam. Skin: No petechial rash or dryness.  Appeared moist.    Lab Results: Lab Results  Component Value Date   WBC 5.0 12/24/2017   HGB 9.4 (L) 12/24/2017   HCT 29.4 (L) 12/24/2017   MCV 81.8 12/24/2017   PLT 189 12/24/2017     Chemistry  Component Value Date/Time   NA 140 09/12/2017 1450   NA 141 04/04/2017 1030   K 3.7 09/12/2017 1450   K 4.4 04/04/2017 1030   CL 109 09/12/2017 1450   CO2 25 09/12/2017 1450   CO2 20 (L) 04/04/2017 1030   BUN 21 09/12/2017 1450   BUN 29.2 (H) 04/04/2017 1030   CREATININE 1.38 (H) 09/12/2017 1450   CREATININE 1.7 (H) 04/04/2017 1030      Component  Value Date/Time   CALCIUM 8.8 09/12/2017 1450   CALCIUM 8.8 04/04/2017 1030   ALKPHOS 61 09/12/2017 1450   ALKPHOS 101 04/04/2017 1030   AST 19 09/12/2017 1450   AST 31 04/04/2017 1030   ALT 16 09/12/2017 1450   ALT 10 04/04/2017 1030   BILITOT 0.2 09/12/2017 1450   BILITOT 0.56 04/04/2017 1030      Impression and Plan:  77 year old man with:  1. Stage IIIA Mantle cell lymphoma presented with pancytopenia and diffuse adenopathy diagnosed in December 2018: .    He is currently on Imbruvica with complete resolution of his symptoms and palpable adenopathy as well as normalization of his counts.  Risks and benefits of continuing this therapy was reviewed today and is agreeable to continue.  Long-term complication associated with this therapy was reviewed he has no objections to continuing.  We will repeat imaging studies if he develops any worsening symptoms.    2.    Advanced prostate cancer that is castration-sensitive with adenopathy.  He continues to be on androgen deprivation and will receive Lupron in October 2019.  He is minimally symptomatic from his disease and I will defer any additional therapy unless he develops castration resistant disease given his lymphoma diagnosis.   3.  Anemia:  His hemoglobin has improved and does not require any transfusion at this time.  4.  Prognosis: He has 2 incurable malignancies at this time although his performance status remains excellent.  Aggressive therapy is warranted at this time.  5.  Follow-up:  In 2 months for labs and Lupron injection.  He will have MD follow-up in 4 months.  15 minutes was spent with the patient face-to-face today.  More than 50% of time was dedicated to discussing the natural course of this disease as well as treatment options and coordinating his future plan of care.       Zola Button, MD 9/3/20193:16 PM

## 2017-12-25 LAB — PROSTATE-SPECIFIC AG, SERUM (LABCORP): PROSTATE SPECIFIC AG, SERUM: 2.2 ng/mL (ref 0.0–4.0)

## 2018-01-02 MED FILL — IMBRUVICA 560 MG TAB: 560 | 28 days supply | Qty: 28 | Fill #0

## 2018-01-28 ENCOUNTER — Other Ambulatory Visit: Payer: Self-pay | Admitting: Oncology

## 2018-01-28 DIAGNOSIS — C8310 Mantle cell lymphoma, unspecified site: Secondary | ICD-10-CM

## 2018-01-29 MED FILL — IMBRUVICA 560 MG TAB: 560 | 28 days supply | Qty: 28 | Fill #0

## 2018-02-04 ENCOUNTER — Telehealth: Payer: Self-pay | Admitting: Oncology

## 2018-02-04 ENCOUNTER — Telehealth: Payer: Self-pay

## 2018-02-04 ENCOUNTER — Other Ambulatory Visit: Payer: Self-pay | Admitting: Medical

## 2018-02-04 DIAGNOSIS — R531 Weakness: Secondary | ICD-10-CM

## 2018-02-04 DIAGNOSIS — R5383 Other fatigue: Secondary | ICD-10-CM

## 2018-02-04 NOTE — Telephone Encounter (Signed)
Returned patient call about not felling well. Patient stated that he has had no energy and not able to eat because when he eats his stomach hurts. He has not had any nausea, not has he had a fever. Patient requesting a visit. Scheduling message sent for patient to be seen in symptom management this week.

## 2018-02-04 NOTE — Telephone Encounter (Signed)
Appts scheduled and patient has been notified per 10/15 sch msg

## 2018-02-05 ENCOUNTER — Telehealth: Payer: Self-pay

## 2018-02-05 ENCOUNTER — Other Ambulatory Visit: Payer: Self-pay

## 2018-02-05 ENCOUNTER — Inpatient Hospital Stay (HOSPITAL_BASED_OUTPATIENT_CLINIC_OR_DEPARTMENT_OTHER): Payer: Medicare HMO | Admitting: Medical

## 2018-02-05 ENCOUNTER — Inpatient Hospital Stay: Payer: Medicare HMO | Attending: Oncology

## 2018-02-05 ENCOUNTER — Inpatient Hospital Stay: Payer: Medicare HMO | Admitting: Medical

## 2018-02-05 VITALS — BP 108/67 | HR 90 | Temp 98.3°F | Resp 18 | Wt 137.1 lb

## 2018-02-05 DIAGNOSIS — R531 Weakness: Secondary | ICD-10-CM

## 2018-02-05 DIAGNOSIS — C8311 Mantle cell lymphoma, lymph nodes of head, face, and neck: Secondary | ICD-10-CM | POA: Diagnosis not present

## 2018-02-05 DIAGNOSIS — Z79899 Other long term (current) drug therapy: Secondary | ICD-10-CM

## 2018-02-05 DIAGNOSIS — C61 Malignant neoplasm of prostate: Secondary | ICD-10-CM | POA: Diagnosis present

## 2018-02-05 DIAGNOSIS — R5383 Other fatigue: Secondary | ICD-10-CM

## 2018-02-05 DIAGNOSIS — D6489 Other specified anemias: Secondary | ICD-10-CM

## 2018-02-05 DIAGNOSIS — Z923 Personal history of irradiation: Secondary | ICD-10-CM | POA: Diagnosis not present

## 2018-02-05 DIAGNOSIS — R634 Abnormal weight loss: Secondary | ICD-10-CM

## 2018-02-05 DIAGNOSIS — D61818 Other pancytopenia: Secondary | ICD-10-CM | POA: Diagnosis not present

## 2018-02-05 DIAGNOSIS — R0602 Shortness of breath: Secondary | ICD-10-CM

## 2018-02-05 DIAGNOSIS — I6381 Other cerebral infarction due to occlusion or stenosis of small artery: Secondary | ICD-10-CM | POA: Diagnosis not present

## 2018-02-05 LAB — CBC WITH DIFFERENTIAL (CANCER CENTER ONLY)
Abs Immature Granulocytes: 0.01 10*3/uL (ref 0.00–0.07)
BASOS ABS: 0 10*3/uL (ref 0.0–0.1)
Basophils Relative: 3 %
EOS PCT: 0 %
Eosinophils Absolute: 0 10*3/uL (ref 0.0–0.5)
HEMATOCRIT: 20.9 % — AB (ref 39.0–52.0)
HEMOGLOBIN: 6.7 g/dL — AB (ref 13.0–17.0)
Immature Granulocytes: 1 %
LYMPHS ABS: 0.6 10*3/uL — AB (ref 0.7–4.0)
LYMPHS PCT: 72 %
MCH: 25.2 pg — ABNORMAL LOW (ref 26.0–34.0)
MCHC: 32.1 g/dL (ref 30.0–36.0)
MCV: 78.6 fL — ABNORMAL LOW (ref 80.0–100.0)
MONO ABS: 0.1 10*3/uL (ref 0.1–1.0)
MONOS PCT: 14 %
NRBC: 0 % (ref 0.0–0.2)
Neutro Abs: 0.1 10*3/uL — CL (ref 1.7–7.7)
Neutrophils Relative %: 10 %
Platelet Count: 46 10*3/uL — ABNORMAL LOW (ref 150–400)
RBC: 2.66 MIL/uL — ABNORMAL LOW (ref 4.22–5.81)
RDW: 15.3 % (ref 11.5–15.5)
WBC Count: 0.9 10*3/uL — CL (ref 4.0–10.5)

## 2018-02-05 LAB — CMP (CANCER CENTER ONLY)
ALT: 17 U/L (ref 0–44)
AST: 16 U/L (ref 15–41)
Albumin: 2.6 g/dL — ABNORMAL LOW (ref 3.5–5.0)
Alkaline Phosphatase: 63 U/L (ref 38–126)
Anion gap: 12 (ref 5–15)
BUN: 67 mg/dL — AB (ref 8–23)
CHLORIDE: 108 mmol/L (ref 98–111)
CO2: 18 mmol/L — AB (ref 22–32)
CREATININE: 1.88 mg/dL — AB (ref 0.61–1.24)
Calcium: 9.1 mg/dL (ref 8.9–10.3)
GFR, Est AFR Am: 39 mL/min — ABNORMAL LOW (ref >60)
GFR, Estimated: 34 mL/min — ABNORMAL LOW (ref >60)
GLUCOSE: 127 mg/dL — AB (ref 70–99)
Potassium: 4.9 mmol/L (ref 3.5–5.1)
SODIUM: 138 mmol/L (ref 135–145)
Total Bilirubin: 0.6 mg/dL (ref 0.3–1.2)
Total Protein: 7 g/dL (ref 6.5–8.1)

## 2018-02-05 LAB — PREPARE RBC (CROSSMATCH)

## 2018-02-05 NOTE — Progress Notes (Signed)
Sympt[facility] Management Clinic Progress Note   [name] [id number] 1940/12/29 77 y.o.  Joel [name] is managed by Dr. 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, face, and neck ([place]) - Plan: Practitioner attestation of consent, Complete patient signature process for consent form, Care order/instruction, Type and screen, DISCONTINUED: acetaminophen (TYLENOL) tablet 650 mg, CANCELED: Prepare RBC, CANCELED: Transfuse RBC  Anemia due to other cause, not classified - Plan: Practitioner attestation of consent, Complete patient signature process for consent form, Care order/instruction, Type and screen, Draw extra clot specimen, DISCONTINUED: acetaminophen (TYLENOL) tablet 650 mg, CANCELED: Prepare RBC, CANCELED: Transfuse RBC  Shortness of breath - Plan: CT Chest W Contrast  Abnormal weight loss - Plan: CT Abdomen Pelvis W Contrast   Mantle cell lymphoma with shortness of breath and abnormal weight loss: The patient continues to be managed by Dr. Alen Blew and is currently on Imbruvica.  His labs from today show pancytopenia with a WBC of 0.9, hemoglobin of 6.7, hematocrit 20.9, and platelet count of 46.  The patient's labs were reviewed with Dr. Alen Blew.  He instructed the patient to hold his Imbruvica.  He has been referred for restaging CT scans.  He will return to clinic to see Dr. Alen Blew after these results are available.  Anemia: The patient's hemoglobin returned at 6.7 with a hematocrit of 20.9.  Based on this the patient was referred for a transfusion of 2 units of packed red blood cells.  Please see After Visit Summary for patient specific instructions.  Future Appointments  Date Time Provider [place]  02/18/2018 12:00 PM [facility] LAB 1 [facility] None  02/18/2018 12:40 PM Vaslow, Acey Lav, MD [facility] None  02/18/2018  1:15 PM [facility] INFUSION  [facility] None  04/18/2018  2:30 PM [facility] LAB 3 [facility] None  04/18/2018  3:00 PM Shadad, Mathis Dad, MD [facility] None    Orders Placed This Encounter  Procedures  . CT Chest W Contrast  . CT Abdomen Pelvis W Contrast  . Draw extra clot specimen  . Type and screen       Subjective:   Patient ID:  Joel [name] is a 77 y.o. (DOB 1941-03-18) male.  Chief Complaint:  Chief Complaint  Patient presents with  . Fatigue    HPI Joel [name] is a 77 year old male with a history of a stage IIIa mantle cell lymphoma which was originally diagnosed in December 2018.  He is followed by Dr. Alen Blew and is currently on Imbruvica.  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  [name] presents to the clinic today having last been seen on 12/24/2017.  At the time of his last visit his labs returned showing a WBC of 5, hemoglobin of 9.4, hematocrit 29.4, and platelet count of 189.  He continues on [place].  He reports that he has been more fatigued and short of breath.  Despite this he continues to work part-time at the [employer].  He has had a 15 pound unexplained weight loss since September.  He denies fevers, chills, sweats, dysuria, or cough.  Medications: I have reviewed the patient's current medications.  Allergies: No Known Allergies  Past Medical History:  Diagnosis Date  . Anemia   . Aneurysm ([place])    in 1987, brain aneurysm  . Bilateral hydronephrosis   . History of radiation therapy 04/17/17-  05/07/17   Right neck treated to 32.5 Gy with 13 fx of 2.5 Gy  . Mass of right side of neck   . Pneumonia    hx of   . Urinary retention     Past Surgical History:  Procedure Laterality Date  . MASS BIOPSY Right 03/27/2017   Procedure: OPEN NECK MASS BIOPSY OF THE RIGHT NECK;  Surgeon: Leta Baptist, MD;  Location: [place];  Service: ENT;  Laterality: Right;  . PROSTATE BIOPSY N/A 07/30/2015   Procedure: PROSTATE  BIOPSY;  Surgeon: Irine Seal, MD;  Location[facility];  Service: Urology;  Laterality: N/A;  . TRANSURETHRAL RESECTION OF PROSTATE N/A 07/30/2015   Procedure: TRANSURETHRAL RESECTION OF THE PROSTATE (TURP);  Surgeon: Irine Seal, MD;  [facility];  Service: Urology;  Laterality: N/A;    Family History  Problem Relation Age of Onset  . Dementia Mother   . Heart disease Father   . Lung cancer Brother   . Lupus Sister     Social History   Socioeconomic History  . Marital status: Married    Spouse name: Not on file  . Number of children: Not on file  . Years of education: Not on file  . Highest education level: Not on file  Occupational History  . Not on file  Social Needs  . Financial resource strain: Not on file  . Food insecurity:    Worry: Not on file    Inability: Not on file  . Transportation needs:    Medical: Not on file    Non-medical: Not on file  Tobacco Use  . Smoking status: Former Smoker    Last attempt to quit: 04/28/1985    Years since quitting: 32.8  . Smokeless tobacco: Former Systems developer    Types: Chew  . Tobacco comment: quit 30-40 yrs. aog  Substance and Sexual Activity  . Alcohol use: No    Alcohol/week: 0.0 standard drinks  . Drug use: No  . Sexual activity: Not on file  Lifestyle  . Physical activity:    Days per week: Not on file    Minutes per session: Not on file  . Stress: Not on file  Relationships  . Social connections:    Talks on phone: Not on file    Gets together: Not on file    Attends religious service: Not on file    Active member of club or organization: Not on file    Attends meetings of clubs or organizations: Not on file    Relationship status: Not on file  . Intimate partner violence:    Fear of current or ex partner: Not on file    Emotionally abused: Not on file    Physically abused: Not on file    Forced sexual activity: Not on file  Other Topics Concern  . Not on file  Social History Narrative  . Not on file    Past  Medical History, Surgical history, Social history, and Family history were reviewed and updated as appropriate.   Please see review of systems for further details on the patient's review from today.   Review of Systems:  Review of Systems  Constitutional: Positive for fatigue. Negative for chills, diaphoresis and fever.  HENT: Negative for congestion, postnasal drip, rhinorrhea, sore throat, trouble swallowing and voice change.   Respiratory: Positive for shortness of breath. Negative for cough, chest tightness and wheezing.   Cardiovascular: Negative for chest pain and palpitations.  Gastrointestinal: Negative for abdominal pain,  constipation, diarrhea, nausea and vomiting.  Musculoskeletal: Negative for back pain and myalgias.  Neurological: Negative for dizziness, light-headedness and headaches.    Objective:   Physical Exam:  BP 108/67 (BP Location: Right Arm, Patient Position: Sitting)   Pulse 90   Temp 98.3 F (36.8 C) (Oral)   Resp 18   Wt 137 lb 1 oz (62.2 kg)   SpO2 100%   BMI 22.12 kg/m  ECOG: 0  Physical Exam  Constitutional: No distress.  HENT:  Head: Normocephalic and atraumatic.  Mouth/Throat: Oropharynx is clear and moist.  Cardiovascular: Normal rate, regular rhythm and normal heart sounds. Exam reveals no gallop and no friction rub.  No murmur heard. Pulmonary/Chest: Effort normal and breath sounds normal. No respiratory distress. He has no wheezes. He has no rales.  Abdominal: Soft. Bowel sounds are normal. He exhibits no distension and no mass. There is no tenderness. There is no rebound and no guarding.  Musculoskeletal: He exhibits no edema.  Neurological: He is alert. Coordination normal.  Skin: Skin is warm and dry. No rash noted. He is not diaphoretic. No erythema.  Psychiatric: He has a normal mood and affect. His behavior is normal. Judgment and thought content normal.    Lab Review:     Component Value Date/Time   NA 138 02/05/2018 0934   NA  141 04/04/2017 1030   K 4.9 02/05/2018 0934   K 4.4 04/04/2017 1030   CL 108 02/05/2018 0934   CO2 18 (L) 02/05/2018 0934   CO2 20 (L) 04/04/2017 1030   GLUCOSE 127 (H) 02/05/2018 0934   GLUCOSE 97 04/04/2017 1030   BUN 67 (H) 02/05/2018 0934   BUN 29.2 (H) 04/04/2017 1030   CREATININE 1.88 (H) 02/05/2018 0934   CREATININE 1.7 (H) 04/04/2017 1030   CALCIUM 9.1 02/05/2018 0934   CALCIUM 8.8 04/04/2017 1030   PROT 7.0 02/05/2018 0934   PROT 7.7 04/12/2017 1423   PROT 8.0 04/04/2017 1030   ALBUMIN 2.6 (L) 02/05/2018 0934   ALBUMIN 3.2 (L) 04/04/2017 1030   AST 16 02/05/2018 0934   AST 31 04/04/2017 1030   ALT 17 02/05/2018 0934   ALT 10 04/04/2017 1030   ALKPHOS 63 02/05/2018 0934   ALKPHOS 101 04/04/2017 1030   BILITOT 0.6 02/05/2018 0934   BILITOT 0.56 04/04/2017 1030   GFRNONAA 34 (L) 02/05/2018 0934   GFRNONAA 6 (L) 07/05/2015 1745   GFRAA 39 (L) 02/05/2018 0934   GFRAA 7 (L) 07/05/2015 1745       Component Value Date/Time   WBC 0.9 (LL) 02/05/2018 0934   WBC 14.5 (H) 05/13/2017 0815   RBC 2.66 (L) 02/05/2018 0934   HGB 6.7 (LL) 02/05/2018 0934   HGB 7.2 (L) 04/12/2017 1423   HCT 20.9 (L) 02/05/2018 0934   HCT 22.3 (L) 04/12/2017 1423   PLT 46 (L) 02/05/2018 0934   PLT 79 (L) 04/12/2017 1423   PLT 119 (L) 02/25/2017 1442   MCV 78.6 (L) 02/05/2018 0934   MCV 89.8 04/12/2017 1423   MCH 25.2 (L) 02/05/2018 0934   MCHC 32.1 02/05/2018 0934   RDW 15.3 02/05/2018 0934   RDW 21.0 (H) 04/12/2017 1423   LYMPHSABS 0.6 (L) 02/05/2018 0934   LYMPHSABS 6.5 (H) 02/25/2017 1442   MONOABS 0.1 02/05/2018 0934   EOSABS 0.0 02/05/2018 0934   EOSABS 0.1 02/25/2017 1442   BASOSABS 0.0 02/05/2018 0934   BASOSABS 0.0 02/25/2017 1442   -------------------------------  Imaging from last 24 hours (if applicable):  Radiology interpretation: No results found.      This case was discussed Dr. Alen Blew. He expressed agreement with my management of this patient.

## 2018-02-05 NOTE — Patient Instructions (Signed)
Anemia Anemia is a condition in which you do not have enough red blood cells or hemoglobin. Hemoglobin is a substance in red blood cells that carries oxygen. When you do not have enough red blood cells or hemoglobin (are anemic), your body cannot get enough oxygen and your organs may not work properly. As a result, you may feel very tired or have other problems. What are the causes? Common causes of anemia include:  Excessive bleeding. Anemia can be caused by excessive bleeding inside or outside the body, including bleeding from the intestine or from periods in women.  Poor nutrition.  Long-lasting (chronic) kidney, thyroid, and liver disease.  Bone marrow disorders.  Cancer and treatments for cancer.  HIV (human immunodeficiency virus) and AIDS (acquired immunodeficiency syndrome).  Treatments for HIV and AIDS.  Spleen problems.  Blood disorders.  Infections, medicines, and autoimmune disorders that destroy red blood cells.  What are the signs or symptoms? Symptoms of this condition include:  Minor weakness.  Dizziness.  Headache.  Feeling heartbeats that are irregular or faster than normal (palpitations).  Shortness of breath, especially with exercise.  Paleness.  Cold sensitivity.  Indigestion.  Nausea.  Difficulty sleeping.  Difficulty concentrating.  Symptoms may occur suddenly or develop slowly. If your anemia is mild, you may not have symptoms. How is this diagnosed? This condition is diagnosed based on:  Blood tests.  Your medical history.  A physical exam.  Bone marrow biopsy.  Your health care provider may also check your stool (feces) for blood and may do additional testing to look for the cause of your bleeding. You may also have other tests, including:  Imaging tests, such as a CT scan or MRI.  Endoscopy.  Colonoscopy.  How is this treated? Treatment for this condition depends on the cause. If you continue to lose a lot of blood,  you may need to be treated at a hospital. Treatment may include:  Taking supplements of iron, vitamin B12, or folic acid.  Taking a hormone medicine (erythropoietin) that can help to stimulate red blood cell growth.  Having a blood transfusion. This may be needed if you lose a lot of blood.  Making changes to your diet.  Having surgery to remove your spleen.  Follow these instructions at home:  Take over-the-counter and prescription medicines only as told by your health care provider.  Take supplements only as told by your health care provider.  Follow any diet instructions that you were given.  Keep all follow-up visits as told by your health care provider. This is important. Contact a health care provider if:  You develop new bleeding anywhere in the body. Get help right away if:  You are very weak.  You are short of breath.  You have pain in your abdomen or chest.  You are dizzy or feel faint.  You have trouble concentrating.  You have bloody or black, tarry stools.  You vomit repeatedly or you vomit up blood. Summary  Anemia is a condition in which you do not have enough red blood cells or enough of a substance in your red blood cells that carries oxygen (hemoglobin).  Symptoms may occur suddenly or develop slowly.  If your anemia is mild, you may not have symptoms.  This condition is diagnosed with blood tests as well as a medical history and physical exam. Other tests may be needed.  Treatment for this condition depends on the cause of the anemia. This information is not intended to replace advice   given to you by your health care provider. Make sure you discuss any questions you have with your health care provider. Document Released: 05/17/2004 Document Revised: 05/11/2016 Document Reviewed: 05/11/2016 Elsevier Interactive Patient Education  [employer].

## 2018-02-05 NOTE — Telephone Encounter (Signed)
Printed avs and calender of upcoming appointment. Per 10/16 los 

## 2018-02-05 NOTE — Progress Notes (Signed)
These preliminary result these preliminary results were noted.  Awaiting final report.

## 2018-02-06 ENCOUNTER — Inpatient Hospital Stay: Payer: Medicare HMO

## 2018-02-06 DIAGNOSIS — C8311 Mantle cell lymphoma, lymph nodes of head, face, and neck: Secondary | ICD-10-CM

## 2018-02-06 DIAGNOSIS — D6489 Other specified anemias: Secondary | ICD-10-CM

## 2018-02-06 DIAGNOSIS — C61 Malignant neoplasm of prostate: Secondary | ICD-10-CM | POA: Diagnosis not present

## 2018-02-06 LAB — ABO/RH: ABO/RH(D): O POS

## 2018-02-06 MED ORDER — ACETAMINOPHEN 325 MG PO TABS
ORAL_TABLET | ORAL | Status: AC
  Filled 2018-02-06: qty 2

## 2018-02-06 MED ORDER — ACETAMINOPHEN 325 MG PO TABS
650.0000 mg | ORAL_TABLET | Freq: Once | ORAL | Status: AC
  Administered 2018-02-06: 650 mg via ORAL

## 2018-02-06 NOTE — Patient Instructions (Signed)

## 2018-02-07 LAB — BPAM RBC
BLOOD PRODUCT EXPIRATION DATE: 201911172359
Blood Product Expiration Date: 201911132359
ISSUE DATE / TIME: 201910170807
ISSUE DATE / TIME: 201910171008
Unit Type and Rh: 5100
Unit Type and Rh: 5100

## 2018-02-07 LAB — TYPE AND SCREEN
ABO/RH(D): O POS
Antibody Screen: NEGATIVE
UNIT DIVISION: 0
Unit division: 0

## 2018-02-18 ENCOUNTER — Inpatient Hospital Stay: Payer: Medicare HMO | Admitting: Internal Medicine

## 2018-02-18 ENCOUNTER — Telehealth: Payer: Self-pay | Admitting: Internal Medicine

## 2018-02-18 ENCOUNTER — Inpatient Hospital Stay: Payer: Medicare HMO

## 2018-02-18 ENCOUNTER — Ambulatory Visit (HOSPITAL_COMMUNITY)
Admission: RE | Admit: 2018-02-18 | Discharge: 2018-02-18 | Disposition: A | Discharge status: Home / Self Care | Payer: Medicare HMO | Source: Ambulatory Visit | Attending: Medical | Admitting: Medical

## 2018-02-18 ENCOUNTER — Encounter (HOSPITAL_COMMUNITY): Payer: Self-pay

## 2018-02-18 DIAGNOSIS — R634 Abnormal weight loss: Secondary | ICD-10-CM | POA: Insufficient documentation

## 2018-02-18 DIAGNOSIS — R0602 Shortness of breath: Secondary | ICD-10-CM | POA: Diagnosis not present

## 2018-02-18 DIAGNOSIS — I700 Atherosclerosis of aorta: Secondary | ICD-10-CM | POA: Diagnosis not present

## 2018-02-18 DIAGNOSIS — D61818 Other pancytopenia: Secondary | ICD-10-CM | POA: Insufficient documentation

## 2018-02-18 DIAGNOSIS — C8310 Mantle cell lymphoma, unspecified site: Secondary | ICD-10-CM | POA: Insufficient documentation

## 2018-02-18 DIAGNOSIS — R918 Other nonspecific abnormal finding of lung field: Secondary | ICD-10-CM | POA: Diagnosis not present

## 2018-02-18 DIAGNOSIS — C8311 Mantle cell lymphoma, lymph nodes of head, face, and neck: Secondary | ICD-10-CM

## 2018-02-18 DIAGNOSIS — C61 Malignant neoplasm of prostate: Secondary | ICD-10-CM

## 2018-02-18 DIAGNOSIS — N400 Enlarged prostate without lower urinary tract symptoms: Secondary | ICD-10-CM | POA: Diagnosis not present

## 2018-02-18 DIAGNOSIS — D6489 Other specified anemias: Secondary | ICD-10-CM

## 2018-02-18 HISTORY — DX: Malignant (primary) neoplasm, unspecified: C80.1

## 2018-02-18 HISTORY — DX: Malignant neoplasm of prostate: C61

## 2018-02-18 LAB — CBC WITH DIFFERENTIAL (CANCER CENTER ONLY)
Band Neutrophils: 6 %
Basophils Absolute: 0 10*3/uL (ref 0.0–0.1)
Basophils Relative: 0 %
EOS ABS: 0 10*3/uL (ref 0.0–0.5)
EOS PCT: 0 %
HEMATOCRIT: 25.4 % — AB (ref 39.0–52.0)
HEMOGLOBIN: 8.2 g/dL — AB (ref 13.0–17.0)
Lymphocytes Relative: 17 %
Lymphs Abs: 1.2 10*3/uL (ref 0.7–4.0)
MCH: 25.2 pg — ABNORMAL LOW (ref 26.0–34.0)
MCHC: 32.3 g/dL (ref 30.0–36.0)
MCV: 78.2 fL — ABNORMAL LOW (ref 80.0–100.0)
Metamyelocytes Relative: 1 %
Monocytes Absolute: 0.4 10*3/uL (ref 0.1–1.0)
Monocytes Relative: 6 %
NRBC: 0 % (ref 0.0–0.2)
Neutro Abs: 2 10*3/uL (ref 1.7–17.7)
Neutrophils Relative %: 21 %
Other: 49 %
Platelet Count: 196 10*3/uL (ref 150–400)
RBC: 3.25 MIL/uL — AB (ref 4.22–5.81)
RDW: 15 % (ref 11.5–15.5)
WBC Morphology: 49
WBC: 7 10*3/uL (ref 4.0–10.5)

## 2018-02-18 LAB — CMP (CANCER CENTER ONLY)
ALBUMIN: 2.3 g/dL — AB (ref 3.5–5.0)
ALK PHOS: 87 U/L (ref 38–126)
ALT: 20 U/L (ref 0–44)
AST: 25 U/L (ref 15–41)
Anion gap: 11 (ref 5–15)
BILIRUBIN TOTAL: 0.4 mg/dL (ref 0.3–1.2)
BUN: 54 mg/dL — AB (ref 8–23)
CALCIUM: 9.1 mg/dL (ref 8.9–10.3)
CO2: 22 mmol/L (ref 22–32)
Chloride: 105 mmol/L (ref 98–111)
Creatinine: 2.06 mg/dL — ABNORMAL HIGH (ref 0.61–1.24)
GFR, Est AFR Am: 35 mL/min — ABNORMAL LOW (ref >60)
GFR, Estimated: 30 mL/min — ABNORMAL LOW (ref >60)
GLUCOSE: 114 mg/dL — AB (ref 70–99)
Potassium: 4.1 mmol/L (ref 3.5–5.1)
Sodium: 138 mmol/L (ref 135–145)
Total Protein: 6.9 g/dL (ref 6.5–8.1)

## 2018-02-18 MED ORDER — IOHEXOL 300 MG/ML  SOLN
80.0000 mL | Freq: Once | INTRAMUSCULAR | Status: AC | PRN
  Administered 2018-02-18: 80 mL via INTRAVENOUS

## 2018-02-18 MED ORDER — LEUPROLIDE ACETATE (4 MONTH) 30 MG IM KIT
30.0000 mg | PACK | Freq: Once | INTRAMUSCULAR | Status: AC
  Administered 2018-02-18: 30 mg via INTRAMUSCULAR
  Filled 2018-02-18: qty 30

## 2018-02-18 MED ORDER — SODIUM CHLORIDE 0.9 % IJ SOLN
INTRAMUSCULAR | Status: AC
  Filled 2018-02-18: qty 50

## 2018-02-18 NOTE — Patient Instructions (Signed)

## 2018-02-18 NOTE — Telephone Encounter (Signed)
Pt sched per provider request.  °

## 2018-02-18 NOTE — Telephone Encounter (Signed)
Per MD its ok to have lab and injection today.

## 2018-02-19 ENCOUNTER — Other Ambulatory Visit: Payer: Self-pay | Admitting: Oncology

## 2018-02-19 ENCOUNTER — Inpatient Hospital Stay (HOSPITAL_BASED_OUTPATIENT_CLINIC_OR_DEPARTMENT_OTHER): Payer: Medicare HMO | Admitting: Internal Medicine

## 2018-02-19 VITALS — BP 109/71 | HR 63 | Temp 97.9°F | Resp 18 | Ht 66.0 in | Wt 128.1 lb

## 2018-02-19 DIAGNOSIS — C8310 Mantle cell lymphoma, unspecified site: Secondary | ICD-10-CM

## 2018-02-19 DIAGNOSIS — C8311 Mantle cell lymphoma, lymph nodes of head, face, and neck: Secondary | ICD-10-CM

## 2018-02-19 DIAGNOSIS — D61818 Other pancytopenia: Secondary | ICD-10-CM

## 2018-02-19 DIAGNOSIS — I6381 Other cerebral infarction due to occlusion or stenosis of small artery: Secondary | ICD-10-CM

## 2018-02-19 DIAGNOSIS — C61 Malignant neoplasm of prostate: Secondary | ICD-10-CM

## 2018-02-19 DIAGNOSIS — Z79899 Other long term (current) drug therapy: Secondary | ICD-10-CM

## 2018-02-19 DIAGNOSIS — Z923 Personal history of irradiation: Secondary | ICD-10-CM

## 2018-02-19 LAB — PROSTATE-SPECIFIC AG, SERUM (LABCORP): Prostate Specific Ag, Serum: 7.2 ng/mL — ABNORMAL HIGH (ref 0.0–4.0)

## 2018-02-19 NOTE — Progress Notes (Signed)
[facility] [place] [address], [place] [address] [phone]   Interval Evaluation  Date of Service: 02/19/18 Patient Name: Joel [name] Patient MRN: [id number] Patient DOB: January 22, 1941 Provider: Ventura Sellers, MD  Identifying Statement:  Joel [name] is a 77 y.o. 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  He presented with urinary retention and a PSA of 95.5.  He underwent a TURP procedure which showed a prostate cancer Gleason score 3+3 = 6. Fluciclovinescan in October 2017 which showed bulky periaortic and retroperitoneal lymph nodes consistent with prostate cancer.   2.  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  This was confirmed in December 2018.  He is status post excisional biopsy of a right neck mass completed on 03/27/2017.  Started Imbruvica 12/18  Interval History:  Joel [name] returns for follow up today.  He again describes no new or progressive neurologic deficits.  He does describe 20+ lbs of unintentional weight loss, poor appetite, poor energy lately.  He has been unable to work because of these issues.  Recently was transfused PRBCs, which did help some.  He also denies seizures, headaches, more stroke symptoms.  Medications: Current Outpatient Medications on File Prior to Visit  Medication Sig Dispense Refill  . aspirin EC 81 MG tablet Take 81 mg by mouth daily.     No current facility-administered medications on file prior to visit.     Allergies: No Known Allergies Past Medical History:  Past Medical History:  Diagnosis Date  . Anemia   . Aneurysm ([place])    in 1987, brain aneurysm  . Bilateral hydronephrosis    . History of radiation therapy 04/17/17- 05/07/17   Right neck treated to 32.5 Gy with 13 fx of 2.5 Gy  . mantle cell lymphoma dx'd 03/2017  . Mass of right side of neck   . Pneumonia    hx of   . Prostate CA ([place]) dx'd 2017  . Urinary retention    Past Surgical History:  Past Surgical History:  Procedure Laterality Date  . MASS BIOPSY Right 03/27/2017   Procedure: OPEN NECK MASS BIOPSY OF THE RIGHT NECK;  Surgeon: Leta Baptist, MD;  Location: [place] [facility];  Service: ENT;  Laterality: Right;  . PROSTATE BIOPSY N/A 07/30/2015   Procedure: PROSTATE BIOPSY;  Surgeon: Irine Seal, MD;  Location[facility];  Service: Urology;  Laterality: N/A;  . TRANSURETHRAL RESECTION OF PROSTATE N/A 07/30/2015   Procedure: TRANSURETHRAL RESECTION OF THE PROSTATE (TURP);  Surgeon: Irine Seal, MD;  [facility];  Service: Urology;  Laterality: N/A;   Social History:  Social History   Socioeconomic History  . Marital status: Married    Spouse name: Not on file  . Number of children: Not on file  . Years of education: Not on file  . Highest education level: Not on file  Occupational History  . Not on file  Social Needs  . Financial resource strain: Not on file  . Food insecurity:    Worry: Not on file    Inability: Not on file  . Transportation needs:    Medical: Not on file    Non-medical: Not on file  Tobacco Use  .  Smoking status: Former Smoker    Last attempt to quit: 04/28/1985    Years since quitting: 32.8  . Smokeless tobacco: Former Systems developer    Types: Chew  . Tobacco comment: quit 30-40 yrs. aog  Substance and Sexual Activity  . Alcohol use: No    Alcohol/week: 0.0 standard drinks  . Drug use: No  . Sexual activity: Not on file  Lifestyle  . Physical activity:    Days per week: Not on file    Minutes per session: Not on file  . Stress: Not on file  Relationships  . Social connections:    Talks on phone: Not on file    Gets together: Not on file    Attends religious service: Not on file     Active member of club or organization: Not on file    Attends meetings of clubs or organizations: Not on file    Relationship status: Not on file  . Intimate partner violence:    Fear of current or ex partner: Not on file    Emotionally abused: Not on file    Physically abused: Not on file    Forced sexual activity: Not on file  Other Topics Concern  . Not on file  Social History Narrative  . Not on file   Family History:  Family History  Problem Relation Age of Onset  . Dementia Mother   . Heart disease Father   . Lung cancer Brother   . Lupus Sister     Review of Systems: Constitutional: +abnormal weight loss Eyes: Denies blurriness of vision Ears, nose, mouth, throat, and face: Denies mucositis or sore throat Respiratory: Denies cough, dyspnea or wheezes Cardiovascular: Denies palpitation, chest discomfort or lower extremity swelling Gastrointestinal:  Denies nausea, constipation, diarrhea GU: Denies dysuria or incontinence Skin: Denies abnormal skin rashes Neurological: Per HPI Musculoskeletal: Denies joint pain, back or neck discomfort. No decrease in ROM Behavioral/Psych: Denies anxiety, disturbance in thought content, and mood instability   Physical Exam: Vitals:   02/19/18 1228  BP: 109/71  Pulse: 63  Resp: 18  Temp: 97.9 F (36.6 C)  SpO2: 100%   KPS: 80. General: Alert, cooperative, pleasant, in no acute distress Head: Normal EENT: Soft tissue mass right neck.  Lungs: Resp effort normal Cardiac: Regular rate and rhythm Abdomen: Soft, non-distended abdomen Skin: No rashes cyanosis or petechiae. Extremities: No clubbing or edema  Neurologic Exam: Mental Status: Awake, alert, attentive to examiner. Oriented to self and environment. Language is fluent with intact comprehension.  Cranial Nerves: Visual acuity is grossly normal. Visual fields are full. Extra-ocular movements intact. No ptosis. Face is symmetric, tongue midline. Motor: Tone and bulk are  normal. Power is full in both arms and legs. Reflexes are symmetric, no pathologic reflexes present. Intact finger to nose bilaterally Sensory: Intact to light touch and temperature Gait: Normal and tandem gait is normal.   Labs: I have reviewed the data as listed    Component Value Date/Time   NA 138 02/18/2018 1448   NA 141 04/04/2017 1030   K 4.1 02/18/2018 1448   K 4.4 04/04/2017 1030   CL 105 02/18/2018 1448   CO2 22 02/18/2018 1448   CO2 20 (L) 04/04/2017 1030   GLUCOSE 114 (H) 02/18/2018 1448   GLUCOSE 97 04/04/2017 1030   BUN 54 (H) 02/18/2018 1448   BUN 29.2 (H) 04/04/2017 1030   CREATININE 2.06 (H) 02/18/2018 1448   CREATININE 1.7 (H) 04/04/2017 1030   CALCIUM 9.1 02/18/2018  1448   CALCIUM 8.8 04/04/2017 1030   PROT 6.9 02/18/2018 1448   PROT 7.7 04/12/2017 1423   PROT 8.0 04/04/2017 1030   ALBUMIN 2.3 (L) 02/18/2018 1448   ALBUMIN 3.2 (L) 04/04/2017 1030   AST 25 02/18/2018 1448   AST 31 04/04/2017 1030   ALT 20 02/18/2018 1448   ALT 10 04/04/2017 1030   ALKPHOS 87 02/18/2018 1448   ALKPHOS 101 04/04/2017 1030   BILITOT 0.4 02/18/2018 1448   BILITOT 0.56 04/04/2017 1030   GFRNONAA 30 (L) 02/18/2018 1448   GFRNONAA 6 (L) 07/05/2015 1745   GFRAA 35 (L) 02/18/2018 1448   GFRAA 7 (L) 07/05/2015 1745   Lab Results  Component Value Date   WBC 7.0 02/18/2018   NEUTROABS 2.0 02/18/2018   HGB 8.2 (L) 02/18/2018   HCT 25.4 (L) 02/18/2018   MCV 78.2 (L) 02/18/2018   PLT 196 02/18/2018     Assessment/Plan 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 Brockmann is neurologically stable today. He has systemic decline which is likely related to anemia, itself apparently secondary to his Kate Sable which is now being held.  Aspirin 81mg  was discontinued because of pancytopenia.  He should remain off anti-platelet therapy until his blood counts stabilize.   We appreciate the opportunity to participate in the care of Joel [name].  He  should return to clinic in 1 year or as needed for neurologic symptoms.  All questions were answered. The patient knows to call the clinic with any problems, questions or concerns. No barriers to learning were detected.  The total time spent in the encounter was 25 minutes and more than 50% was on counseling and review of test results   Ventura Sellers, MD Medical Director of Neuro-Oncology [facility] [place] 02/19/18 12:19 PM

## 2018-02-20 ENCOUNTER — Inpatient Hospital Stay (HOSPITAL_BASED_OUTPATIENT_CLINIC_OR_DEPARTMENT_OTHER): Payer: Medicare HMO | Admitting: Oncology

## 2018-02-20 ENCOUNTER — Telehealth: Payer: Self-pay

## 2018-02-20 VITALS — BP 104/66 | HR 88 | Temp 98.1°F | Resp 18 | Ht 66.0 in | Wt 128.3 lb

## 2018-02-20 DIAGNOSIS — D61818 Other pancytopenia: Secondary | ICD-10-CM

## 2018-02-20 DIAGNOSIS — C61 Malignant neoplasm of prostate: Secondary | ICD-10-CM

## 2018-02-20 DIAGNOSIS — Z79899 Other long term (current) drug therapy: Secondary | ICD-10-CM

## 2018-02-20 DIAGNOSIS — I6381 Other cerebral infarction due to occlusion or stenosis of small artery: Secondary | ICD-10-CM | POA: Diagnosis not present

## 2018-02-20 DIAGNOSIS — C8311 Mantle cell lymphoma, lymph nodes of head, face, and neck: Secondary | ICD-10-CM

## 2018-02-20 DIAGNOSIS — Z923 Personal history of irradiation: Secondary | ICD-10-CM

## 2018-02-20 MED ORDER — MEGESTROL ACETATE 400 MG/10ML PO SUSP: 400.0000 mg | Freq: Two times a day (BID) | ORAL | 1 refills | Status: DC

## 2018-02-20 NOTE — Telephone Encounter (Signed)
Per 10/30 no los 

## 2018-02-20 NOTE — Telephone Encounter (Signed)
Printed avs and calender of upcoming appointment. Per 10/31 los 

## 2018-02-20 NOTE — Progress Notes (Signed)
Hematology and Oncology Follow Up Visit  Joel [name] [id number] Jul 26, 1940 77 y.o. 02/20/2018 9:21 AM Joel [name], MDSanford, Joel Leeds, MD   Principle Diagnosis: 77 year old man with:  1.  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   2.  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   Prior Therapy:   He is status post excisional biopsy of a right neck mass completed on 03/27/2017 with the pathology confirming mantle cell lymphoma.  He is status post palliative radiation therapy to his cervical adenopathy completed in January 2019.   Current therapy:   Imbruvica 560 mg daily in December 2018.  Lupron 30 mg once every 4 months since January 2019.   Interim History: Joel [name] presents today for a follow-up.  Since last visit, he presented acutely with complaints of excessive fatigue and tiredness on February 05, 2018.  At that time he was found to have profound neutropenia and thrombocytopenia and a hemoglobin of 6.7.  Imbruvica was held at the time and received 2 units of packed red cell transfusion.  Repeat laboratory testing on 02/18/2018 showed improvement in all of his counts.  He also had a CT scan as well and is here to discuss that.  Since receiving 2 units of packed red cells his energy level has improved and he is less fatigued.  His appetite remains poor however and have lost close to 30 pounds at this time.  He denies any fevers or painful adenopathy.  He denies any recent hospitalizations or illnesses.  He denies any bone pain or pathological fractures.    He does not report any headaches, blurry vision, syncope or seizures.  He denies any confusion or lethargy.Marland Kitchen  He does not report any fevers, chills, sweats.   He does not report any chest pain, palpitation, orthopnea or leg edema.  He does not report any cough, wheezing or hemoptysis.  He does not report any nausea, vomiting or  abdominal pain.    He denies any constipation or diarrhea.  He does not report any frequency urgency or hesitancy.   He does not report any arthralgias or myalgias.  He does not report any easy bruising or bleeding.  Remaining review of systems is negative.  Medications: I have reviewed the patient's current medications.  Current Outpatient Medications  Medication Sig Dispense Refill  . aspirin EC 81 MG tablet Take 81 mg by mouth daily.    . IMBRUVICA 560 MG TABS TAKE 1 TABLET (560 MG) BY MOUTH DAILY. TAKE WITH A FULL GLASS OF WATER AT APPROXIMATELY THE SAME TIME EACH DAY. (Patient not taking: Reported on 02/19/2018) 28 tablet 0   No current facility-administered medications for this visit.      Allergies: No Known Allergies  Past Medical History, Surgical history, Social history, and Family History discussed today and remained unchanged.  Physical Exam:  Blood pressure 104/66, pulse 88, temperature 98.1 F (36.7 C), temperature source Oral, resp. rate 18, height 5\' 6"  (1.676 m), weight 128 lb 4.8 oz (58.2 kg), SpO2 97 %.   ECOG:1   General appearance: Alert, awake without any distress. Head: Atraumatic without abnormalities Oropharynx: Without any thrush or ulcers. Eyes: No scleral icterus. Lymph nodes: No lymphadenopathy noted in the cervical, supraclavicular, or axillary nodes Heart:regular rate and rhythm, without any murmurs or gallops.   Lung: Clear to auscultation without any rhonchi, wheezes or dullness to percussion. Abdomin: Soft, nontender without any shifting dullness or ascites.  Musculoskeletal: No clubbing or cyanosis. Neurological: No motor or sensory deficits. Skin: No rashes or lesions.    Lab Results: Lab Results  Component Value Date   WBC 7.0 02/18/2018   HGB 8.2 (L) 02/18/2018   HCT 25.4 (L) 02/18/2018   MCV 78.2 (L) 02/18/2018   PLT 196 02/18/2018     Chemistry      Component Value Date/Time   NA 138 02/18/2018 1448   NA 141 04/04/2017 1030    K 4.1 02/18/2018 1448   K 4.4 04/04/2017 1030   CL 105 02/18/2018 1448   CO2 22 02/18/2018 1448   CO2 20 (L) 04/04/2017 1030   BUN 54 (H) 02/18/2018 1448   BUN 29.2 (H) 04/04/2017 1030   CREATININE 2.06 (H) 02/18/2018 1448   CREATININE 1.7 (H) 04/04/2017 1030      Component Value Date/Time   CALCIUM 9.1 02/18/2018 1448   CALCIUM 8.8 04/04/2017 1030   ALKPHOS 87 02/18/2018 1448   ALKPHOS 101 04/04/2017 1030   AST 25 02/18/2018 1448   AST 31 04/04/2017 1030   ALT 20 02/18/2018 1448   ALT 10 04/04/2017 1030   BILITOT 0.4 02/18/2018 1448   BILITOT 0.56 04/04/2017 1030      CLINICAL DATA:  Shortness of breath and weakness since September. 15 pound weight loss. Mantle cell lymphoma diagnosed in 2018 with radiation therapy complete. Prostate cancer 2 years ago with hormone therapy in progress. Nonlocalized abdominal pain.  EXAM: CT CHEST, ABDOMEN, AND PELVIS WITH CONTRAST  TECHNIQUE: Multidetector CT imaging of the chest, abdomen and pelvis was performed following the standard protocol during bolus administration of intravenous contrast.  CONTRAST:  46mL OMNIPAQUE IOHEXOL 300 MG/ML SOLN 80 cc of Omnipaque 300  COMPARISON:  PET 04/25/2017  FINDINGS: CT CHEST FINDINGS  Cardiovascular: Aortic and branch vessel atherosclerosis. Tortuous thoracic aorta. Normal heart size, without pericardial effusion. No central pulmonary embolism, on this non-dedicated study.  Mediastinum/Nodes: Right supraclavicular/low jugular node measures 1.4 cm on image 7/2 and is at the site of a 4.5 cm nodal mass on the prior PET.  No axillary adenopathy. Small middle mediastinal and prevascular nodes are not pathologic by size criteria.  Lungs/Pleura: No pleural fluid. Improvement in posterior right upper lobe clustered nodularity and interstitial thickening, including on image 42/7.  Bronchiectasis, mucoid impaction, and peribronchovascular interstitial thickening within the  superior segment left lower lobe are significantly improved compared to 02/27/2017.  Musculoskeletal: No acute osseous abnormality.  CT ABDOMEN PELVIS FINDINGS  Hepatobiliary: Left hepatic lobe cysts. Normal gallbladder, without biliary ductal dilatation.  Pancreas: Normal, without mass or ductal dilatation.  Spleen: Normal in size, without focal abnormality.  Adrenals/Urinary Tract: Normal adrenal glands. Normal kidneys, without hydronephrosis. The bladder appears thick walled, but is underdistended. Less impressive than on the prior PET.  Stomach/Bowel: Portions of the stomach are underdistended. Normal colon and terminal ileum. Normal small bowel.  Vascular/Lymphatic: Aortic and branch vessel atherosclerosis. Improvement in retroperitoneal adenopathy. Aortocaval nodal tissue measures on the order of 2.3 cm on image 74/2. Compare 3.5 cm on the prior PET.  A right common iliac node measures 10 mm on image 89/2. This area is poorly evaluated on the prior PET.  Index left external iliac node measures 1.1 cm on image 105/2 versus 2.2 cm on the prior PET.  Reproductive: Mild prostatomegaly.  Other: No significant free fluid.  Musculoskeletal: Degenerative partial fusion of the bilateral sacroiliac joints. Advanced lumbosacral spondylosis.  IMPRESSION: 1. Since the 04/25/2017 PET, response to therapy of  adenopathy within the neck, abdomen, and pelvis, as detailed above. 2. No evidence of new or progressive disease. 3. Improvement in right upper and left lower lobe areas of likely infectious/inflammatory nodularity, bronchiectasis, and mucoid impaction. 4.  Aortic Atherosclerosis (ICD10-I70.0). 5. Prostatomegaly. Suspect a component of bladder outlet obstruction, as evidenced by wall thickening.    Impression and Plan:  77 year old man with:  1.  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  He was found to have stage IIIA disease and  pancytopenia.   He has been taking Imbruvica that was held last 2 weeks because of pancytopenia.  Imaging studies obtained on 02/18/2018 were personally reviewed and showed complete response to therapy at this time.  There is no evidence to suggest disease relapse or worsening lymphadenopathy.  The natural course of this disease was reviewed today and treatment approach was discussed.  At this time, I have recommended continuing to hold Imbruvica given his overall response to therapy and overall debilitation related to treatment.  We will assess him in 4 weeks and determine best course of action depending on how well he feels.  I will consider restarting Imbruvica at a lower dose.    2.   Castration-sensitive prostate cancer with pelvic adenopathy.  He is currently on Lupron with reasonable response to therapy.  His PSA is slowly rising which could indicate developing castration resistant disease.  Given his underlying lymphoma, additional therapy will be withheld for the time being until is a clear progression of disease.   3.  Anemia:  His hemoglobin has stabilized at this time and does not require transfusion.  4.  Prognosis: His performance status remains adequate and aggressive therapy is still warranted.  He has 2 incurable malignancies however in any therapy remains palliative.  5.  Follow-up:  In 4 weeks to follow his progress.  25 minutes was spent with the patient face-to-face today.  More than 50% of time was dedicated to reviewing the natural course of his disease, imaging studies and coordinating plan of care.        Zola Button, MD 10/31/20199:21 AM

## 2018-02-27 MED FILL — IMBRUVICA 560 MG TAB: 560 | 28 days supply | Qty: 28 | Fill #0

## 2018-02-28 ENCOUNTER — Telehealth: Payer: Self-pay

## 2018-02-28 NOTE — Telephone Encounter (Signed)
Oral Oncology Patient Advocate Encounter  I was successful at securing a grant with [facility] for $8500. This will keep the out of pocket expense for Imbruvica at $0. The grant information is as follows and has been shared with [facility].  Approval dates: 01/29/18-01/29/19 ID: [id number] Group: [id number] BIN: [id number] PCN: [id number]  I called the patient and gave him this information. He verbalized understanding and great appreciation   [facility] Phone [phone] Fax [phone]

## 2018-03-03 ENCOUNTER — Telehealth: Payer: Self-pay

## 2018-03-03 NOTE — Telephone Encounter (Signed)
Received VM message that patient is now accepted into [facility] and provider is Vidal Schwalbe NP for PCP services.

## 2018-03-07 ENCOUNTER — Telehealth: Payer: Self-pay | Admitting: *Deleted

## 2018-03-07 NOTE — Telephone Encounter (Signed)
Medical records faxed to [facility]; ID# [id number]

## 2018-03-10 ENCOUNTER — Other Ambulatory Visit: Payer: Self-pay

## 2018-03-10 MED ORDER — MEGESTROL ACETATE 400 MG/10ML PO SUSP: 400.0000 mg | Freq: Two times a day (BID) | ORAL | 1 refills | Status: DC

## 2018-03-19 ENCOUNTER — Observation Stay (HOSPITAL_COMMUNITY)
Admission: EM | Admit: 2018-03-19 | Discharge: 2018-03-21 | Disposition: A | Discharge status: Home Health | Payer: Medicare HMO | Attending: Family Medicine | Admitting: Family Medicine

## 2018-03-19 ENCOUNTER — Other Ambulatory Visit: Payer: Self-pay | Admitting: Oncology

## 2018-03-19 DIAGNOSIS — I959 Hypotension, unspecified: Secondary | ICD-10-CM | POA: Insufficient documentation

## 2018-03-19 DIAGNOSIS — C8311 Mantle cell lymphoma, lymph nodes of head, face, and neck: Principal | ICD-10-CM | POA: Insufficient documentation

## 2018-03-19 DIAGNOSIS — N179 Acute kidney failure, unspecified: Secondary | ICD-10-CM

## 2018-03-19 DIAGNOSIS — C61 Malignant neoplasm of prostate: Secondary | ICD-10-CM | POA: Diagnosis present

## 2018-03-19 DIAGNOSIS — N184 Chronic kidney disease, stage 4 (severe): Secondary | ICD-10-CM | POA: Diagnosis not present

## 2018-03-19 DIAGNOSIS — Z87891 Personal history of nicotine dependence: Secondary | ICD-10-CM | POA: Diagnosis not present

## 2018-03-19 DIAGNOSIS — D61818 Other pancytopenia: Secondary | ICD-10-CM

## 2018-03-19 DIAGNOSIS — R55 Syncope and collapse: Secondary | ICD-10-CM

## 2018-03-19 DIAGNOSIS — E43 Unspecified severe protein-calorie malnutrition: Secondary | ICD-10-CM | POA: Diagnosis not present

## 2018-03-19 DIAGNOSIS — D630 Anemia in neoplastic disease: Secondary | ICD-10-CM | POA: Diagnosis not present

## 2018-03-19 DIAGNOSIS — D649 Anemia, unspecified: Secondary | ICD-10-CM

## 2018-03-19 DIAGNOSIS — C8310 Mantle cell lymphoma, unspecified site: Secondary | ICD-10-CM

## 2018-03-19 DIAGNOSIS — Z8673 Personal history of transient ischemic attack (TIA), and cerebral infarction without residual deficits: Secondary | ICD-10-CM | POA: Diagnosis not present

## 2018-03-19 DIAGNOSIS — N183 Chronic kidney disease, stage 3 unspecified: Secondary | ICD-10-CM | POA: Diagnosis present

## 2018-03-19 DIAGNOSIS — E46 Unspecified protein-calorie malnutrition: Secondary | ICD-10-CM

## 2018-03-19 DIAGNOSIS — Z8546 Personal history of malignant neoplasm of prostate: Secondary | ICD-10-CM | POA: Diagnosis not present

## 2018-03-19 DIAGNOSIS — I639 Cerebral infarction, unspecified: Secondary | ICD-10-CM | POA: Diagnosis present

## 2018-03-19 NOTE — ED Triage Notes (Signed)
Pt arrives via EMS from home. EMS was called out, spouse found him unresponsive at home. Not responsive to first arriving crew, with initial bp 80/40. EMS arrived, IV established, 250 ns bolus, pt more responsive and alert. Last vitals en route 124/54, r 22, cbg 343, 90 hr.

## 2018-03-20 ENCOUNTER — Emergency Department (HOSPITAL_COMMUNITY): Payer: Medicare HMO

## 2018-03-20 ENCOUNTER — Encounter (HOSPITAL_COMMUNITY): Payer: Self-pay | Admitting: *Deleted

## 2018-03-20 ENCOUNTER — Other Ambulatory Visit: Payer: Self-pay

## 2018-03-20 DIAGNOSIS — D61818 Other pancytopenia: Secondary | ICD-10-CM | POA: Diagnosis not present

## 2018-03-20 DIAGNOSIS — I959 Hypotension, unspecified: Secondary | ICD-10-CM | POA: Diagnosis present

## 2018-03-20 DIAGNOSIS — R55 Syncope and collapse: Secondary | ICD-10-CM | POA: Diagnosis present

## 2018-03-20 DIAGNOSIS — D649 Anemia, unspecified: Secondary | ICD-10-CM

## 2018-03-20 DIAGNOSIS — C61 Malignant neoplasm of prostate: Secondary | ICD-10-CM

## 2018-03-20 DIAGNOSIS — N179 Acute kidney failure, unspecified: Secondary | ICD-10-CM | POA: Diagnosis not present

## 2018-03-20 DIAGNOSIS — N183 Chronic kidney disease, stage 3 (moderate): Secondary | ICD-10-CM

## 2018-03-20 DIAGNOSIS — E43 Unspecified severe protein-calorie malnutrition: Secondary | ICD-10-CM

## 2018-03-20 LAB — COMPREHENSIVE METABOLIC PANEL
ALT: 25 U/L (ref 0–44)
AST: 35 U/L (ref 15–41)
Albumin: 2.2 g/dL — ABNORMAL LOW (ref 3.5–5.0)
Alkaline Phosphatase: 57 U/L (ref 38–126)
Anion gap: 15 (ref 5–15)
BUN: 70 mg/dL — ABNORMAL HIGH (ref 8–23)
CALCIUM: 8.5 mg/dL — AB (ref 8.9–10.3)
CO2: 18 mmol/L — AB (ref 22–32)
CREATININE: 1.79 mg/dL — AB (ref 0.61–1.24)
Chloride: 106 mmol/L (ref 98–111)
GFR, EST AFRICAN AMERICAN: 42 mL/min — AB (ref >60)
GFR, EST NON AFRICAN AMERICAN: 36 mL/min — AB (ref >60)
Glucose, Bld: 255 mg/dL — ABNORMAL HIGH (ref 70–99)
Potassium: 4.8 mmol/L (ref 3.5–5.1)
SODIUM: 139 mmol/L (ref 135–145)
Total Bilirubin: 0.4 mg/dL (ref 0.3–1.2)
Total Protein: 6.1 g/dL — ABNORMAL LOW (ref 6.5–8.1)

## 2018-03-20 LAB — CBC
HCT: 12.1 % — ABNORMAL LOW (ref 39.0–52.0)
Hemoglobin: 3.7 g/dL — CL (ref 13.0–17.0)
MCH: 24.7 pg — ABNORMAL LOW (ref 26.0–34.0)
MCHC: 30.6 g/dL (ref 30.0–36.0)
MCV: 80.7 fL (ref 80.0–100.0)
NRBC: 0 % (ref 0.0–0.2)
PLATELETS: 32 10*3/uL — AB (ref 150–400)
RBC: 1.5 MIL/uL — ABNORMAL LOW (ref 4.22–5.81)
RDW: 14.9 % (ref 11.5–15.5)
WBC: 3.2 10*3/uL — ABNORMAL LOW (ref 4.0–10.5)

## 2018-03-20 LAB — PREPARE RBC (CROSSMATCH)

## 2018-03-20 MED ORDER — ACETAMINOPHEN 650 MG RE SUPP: 650.0000 mg | Freq: Four times a day (QID) | RECTAL | Status: DC | PRN

## 2018-03-20 MED ORDER — SENNOSIDES-DOCUSATE SODIUM 8.6-50 MG PO TABS: 1.0000 | ORAL_TABLET | Freq: Every evening | ORAL | Status: DC | PRN

## 2018-03-20 MED ORDER — ONDANSETRON HCL 4 MG PO TABS: 4.0000 mg | ORAL_TABLET | Freq: Four times a day (QID) | ORAL | Status: DC | PRN

## 2018-03-20 MED ORDER — ACETAMINOPHEN 325 MG PO TABS: 650.0000 mg | ORAL_TABLET | Freq: Four times a day (QID) | ORAL | Status: DC | PRN

## 2018-03-20 MED ORDER — SODIUM CHLORIDE 0.9 % IV BOLUS
500.0000 mL | Freq: Once | INTRAVENOUS | Status: AC
  Administered 2018-03-20: 500 mL via INTRAVENOUS

## 2018-03-20 MED ORDER — ENSURE ENLIVE PO LIQD
237.0000 mL | Freq: Two times a day (BID) | ORAL | Status: DC
  Administered 2018-03-20 – 2018-03-21 (×3): 237 mL via ORAL

## 2018-03-20 MED ORDER — SODIUM CHLORIDE 0.9 % IV SOLN: 10.0000 mL/h | Freq: Once | INTRAVENOUS | Status: DC

## 2018-03-20 MED ORDER — INFLUENZA VAC SPLIT HIGH-DOSE 0.5 ML IM SUSY: 0.5000 mL | PREFILLED_SYRINGE | INTRAMUSCULAR | Status: DC

## 2018-03-20 MED ORDER — POLYETHYLENE GLYCOL 3350 17 G PO PACK
17.0000 g | PACK | Freq: Every day | ORAL | Status: DC
  Administered 2018-03-20 – 2018-03-21 (×2): 17 g via ORAL
  Filled 2018-03-20 (×2): qty 1

## 2018-03-20 MED ORDER — ONDANSETRON HCL 4 MG/2ML IJ SOLN: 4.0000 mg | Freq: Four times a day (QID) | INTRAMUSCULAR | Status: DC | PRN

## 2018-03-20 MED ORDER — ZOLPIDEM TARTRATE 5 MG PO TABS: 5.0000 mg | ORAL_TABLET | Freq: Every evening | ORAL | Status: DC | PRN

## 2018-03-20 MED ORDER — SENNOSIDES-DOCUSATE SODIUM 8.6-50 MG PO TABS
1.0000 | ORAL_TABLET | Freq: Two times a day (BID) | ORAL | Status: DC
  Administered 2018-03-20 – 2018-03-21 (×2): 1 via ORAL
  Filled 2018-03-20 (×2): qty 1

## 2018-03-20 NOTE — Progress Notes (Signed)
[facility] TEAM 1 - Stepdown/ICU TEAM  Kenney Going  QHU:[id number] DOB: 15-Aug-1940 DOA: 03/19/2018 PCP: [name], MD    Brief Narrative:  77 y.o. male w/ a hx of stroke, mantle cell lymphoma, prostate cancer, anemia, and CKD 4 who presented with acute syncope and a month of progressive generalized weakness. Denied dark stool, hematuria or hematochezia.  Patient was transfused with 2 units of blood one month prior due to anemia.   In the ED he was found to have a hemoglobin of 3.7, dropped from 8.2 on 02/18/2018.   Subjective: Pt is seen for a f/u visit.    Assessment & Plan:  Symptomatic anemia Hgb dropped from 8.2 on 02/18/18 > 3.7 - due to mantle cell lymphoma - s/p 4U PRBC  Recent Labs  Lab 03/20/18 0019  HGB 3.7*    Mantle cell lymphoma - lymph nodes of head, face, and neck s/p radiation therapy - followed up with Dr. Alen Blew - oral chemotherapy was discontinued 1 month ago due to intolerance  Pancytopenia  Due to above   Prostate cancer No chemo or radiation therapy - PSA 7.2 on 02/18/2018.  Protein-calorie malnutrition, severe  Nutrition consult  Acute renal failure superimposed on stage 3 CKD baseline cr 1.2-1.4  Hx of Stroke   Hypotension due to severe anemia   Syncope due to severe anemia and hypotension  DVT prophylaxis: SCDs Code Status: FULL CODE Family Communication: spoke w/ wife and dgtr at bedside  Disposition Plan: transfer to tele - PT/OT - f/u BMET and CBC in AM - ?D/C 48-72hrs  Consultants:  none  Antimicrobials:  none   Objective: Blood pressure 123/86, pulse 96, temperature 98.2 F (36.8 C), temperature source Oral, resp. rate 18, height 5\' 7"  (1.702 m), weight 52 kg, SpO2 100 %.  Intake/Output Summary (Last 24 hours) at 03/20/2018 1543 Last data filed at 03/20/2018 1402 Gross per 24 hour  Intake 2121 ml  Output 650 ml  Net 1471 ml   Filed Weights   03/20/18 0508  Weight: 52 kg    Examination: Pt was  seen for a f/u visit.    CBC: Recent Labs  Lab 03/20/18 0019  WBC 3.2*  HGB 3.7*  HCT 12.1*  MCV 80.7  PLT 32*   Basic Metabolic Panel: Recent Labs  Lab 03/20/18 0019  NA 139  K 4.8  CL 106  CO2 18*  GLUCOSE 255*  BUN 70*  CREATININE 1.79*  CALCIUM 8.5*   GFR: Estimated Creatinine Clearance: 26.2 mL/min (A) (by C-G formula based on SCr of 1.79 mg/dL (H)).  Liver Function Tests: Recent Labs  Lab 03/20/18 0019  AST 35  ALT 25  ALKPHOS 57  BILITOT 0.4  PROT 6.1*  ALBUMIN 2.2*   No results for input(s): LIPASE, AMYLASE in the last 168 hours. No results for input(s): AMMONIA in the last 168 hours.  Coagulation Profile: No results for input(s): INR, PROTIME in the last 168 hours.  Cardiac Enzymes: No results for input(s): CKTOTAL, CKMB, CKMBINDEX, TROPONINI in the last 168 hours.  HbA1C: Hemoglobin A1C  Date/Time Value Ref Range Status  07/05/2015 06:04 PM 6.3  Final   Hgb A1c MFr Bld  Date/Time Value Ref Range Status  07/08/2015 01:06 AM 6.1 (H) 4.8 - 5.6 % Final    Comment:    (NOTE)         Pre-diabetes: 5.7 - 6.4         Diabetes: >6.4  Glycemic control for adults with diabetes: <7.0     CBG: No results for input(s): GLUCAP in the last 168 hours.  No results found for this or any previous visit (from the past 240 hour(s)).   Scheduled Meds: . feeding supplement (ENSURE ENLIVE)  237 mL Oral BID BM  . [START ON 03/21/2018] Influenza vac split quadrivalent PF  0.5 mL Intramuscular Tomorrow-1000   Continuous Infusions: . sodium chloride       LOS: 0 days   Time spent: No Charge  Cherene Altes, MD [facility] Office  [phone] Pager - Text Page per Amion as per below:  On-Call/Text Page:      [email or web address]  If 7PM-7AM, please contact night-coverage [email or web address] 03/20/2018, 3:43 PM

## 2018-03-20 NOTE — ED Notes (Signed)
Pt placed on bedpan for BM. Will transport to floor after complete

## 2018-03-20 NOTE — ED Notes (Signed)
Admitting provider with pt

## 2018-03-20 NOTE — Progress Notes (Signed)
Patient admitted from ED with HGB of 3.7 and order for transfusion of 5 units of PRBC'S. Patient alert and Vitals stable. Second units of blood transfusing. Oriented to room and surroundings. Family at bedside. Telemetry applied and verified with two staff. Will continue to monitor.

## 2018-03-20 NOTE — Plan of Care (Signed)

## 2018-03-20 NOTE — ED Notes (Signed)
Pt wife is at the bedside. She reports the patient has been increasingly weak over the past week or so, has not been eating well. She said she went to get him to go to bed and he was talking, but then she left, went back and could not wake him back up so she called ems. Pt is now alert, answering questions.

## 2018-03-20 NOTE — H&P (Signed)
History and Physical    Joel [name] BTD:[id number] DOB: 07/10/1940 DOA: 03/19/2018  Referring MD/NP/PA:   PCP: [name], MD   Patient coming from:  The patient is coming from home.  At baseline, pt is independent for most of ADL.        Chief Complaint: syncope and generalized weakness  HPI: Joel [name] is a 77 y.o. male with medical history significant of stroke, mental cell lymphoma, prostate cancer, anemia, CKD-4, who presents with syncope and generalized weakness.  Patient states that he has been feeling generalized weak for almost a month, which has been progressively getting worse, much worse in the past several days.  No unilateral numbness or tingling his extremities.  No facial droop or slurred speech.  No vision change or hearing loss.  He also has poor appetite and decreased oral intake recently.  Denies nausea, vomiting, diarrhea or abdominal pain.  No chest pain, shortness breath, cough.  No symptoms for UTI.  Per his wife, patient passed out very shortly at about 10 PM when he was sitting the chair.  No fall.  No any injury.  Initially patient had hypotension with blood pressure 80/40, which improved to 105/50 after giving 250 cc normal saline.  Patient denies dark stool, hematuria or hematochezia.  Patient states that he was transfused with 2 unit of blood one month ago due to anemia.   ED Course: pt was found to have hemoglobin dropped from 8.2 on 02/18/2018 to 3.7, WBC 3.2, platelets 232.  Worsening renal function, pending urinalysis, temperature normal, heart rate in 90s, no tachypnea, oxygen saturation 100% on room air.  Chest x-ray negative.  Patient is placed on telemetry bed for observation.  Review of Systems:   General: no fevers, chills, no body weight gain, has poor appetite, has fatigue HEENT: no blurry vision, hearing changes or sore throat Respiratory: no dyspnea, coughing, wheezing CV: no chest pain, no palpitations GI: no nausea, vomiting, abdominal  pain, diarrhea, constipation.  Has poor appetite and decreased oral intake. GU: no dysuria, burning on urination, increased urinary frequency, hematuria  Ext: no leg edema Neuro: no unilateral weakness, numbness, or tingling, no vision change or hearing loss.  Had syncope Skin: no rash, no skin tear. MSK: No muscle spasm, no deformity, no limitation of range of movement in spin Heme: No easy bruising.  Travel history: No recent long distant travel.  Allergy: No Known Allergies  Past Medical History:  Diagnosis Date  . Anemia   . Aneurysm ([place])    in 1987, brain aneurysm  . Bilateral hydronephrosis   . History of radiation therapy 04/17/17- 05/07/17   Right neck treated to 32.5 Gy with 13 fx of 2.5 Gy  . mantle cell lymphoma dx'd 03/2017  . Mass of right side of neck   . Pneumonia    hx of   . Prostate CA ([place]) dx'd 2017  . Urinary retention     Past Surgical History:  Procedure Laterality Date  . MASS BIOPSY Right 03/27/2017   Procedure: OPEN NECK MASS BIOPSY OF THE RIGHT NECK;  Surgeon: Leta Baptist, MD;  Location: [facility];  Service: ENT;  Laterality: Right;  . PROSTATE BIOPSY N/A 07/30/2015   Procedure: PROSTATE BIOPSY;  Surgeon: Irine Seal, MD;  Location: [facility];  Service: Urology;  Laterality: N/A;  . TRANSURETHRAL RESECTION OF PROSTATE N/A 07/30/2015   Procedure: TRANSURETHRAL RESECTION OF THE PROSTATE (TURP);  Surgeon: Irine Seal, MD;  Location: [facility];  Service: Urology;  Laterality: N/A;    Social History:  reports that he quit smoking about 32 years ago. He has quit using smokeless tobacco.  His smokeless tobacco use included chew. He reports that he does not drink alcohol or use drugs.  Family History:  Family History  Problem Relation Age of Onset  . Dementia Mother   . Heart disease Father   . Lung cancer Brother   . Lupus Sister      Prior to Admission medications   Medication Sig Start Date End Date Taking? Authorizing Provider  IMBRUVICA 560 MG TABS TAKE 1 TABLET  (560 MG) BY MOUTH DAILY. TAKE WITH A FULL GLASS OF WATER AT APPROXIMATELY THE SAME TIME EACH DAY. Patient not taking: Reported on 03/20/2018 03/19/18   Wyatt Portela, MD  megestrol (MEGACE) 400 MG/10ML suspension Take 10 mLs (400 mg total) by mouth 2 (two) times daily. Patient not taking: Reported on 03/20/2018 03/10/18   Wyatt Portela, MD    Physical Exam: Vitals:   03/20/18 0106 03/20/18 0202 03/20/18 0220 03/20/18 0236  BP: (!) 105/58 107/70 115/63 102/64  Pulse: 91 96 90 86  Resp: 19 (!) 21 19 (!) 21  Temp:   98.2 F (36.8 C) 98.5 F (36.9 C)  TempSrc:   Oral Oral  SpO2: 100% 100%  100%   General: Not in acute distress.  Cachectic looking. Very pale looking. HEENT:       Eyes: PERRL, EOMI, no scleral icterus.       ENT: No discharge from the ears and nose, no pharynx injection, no tonsillar enlargement.        Neck: No JVD, no bruit, no mass felt. Heme: No neck lymph node enlargement. Cardiac: S1/S2, RRR, No murmurs, No gallops or rubs. Respiratory: No rales, wheezing, rhonchi or rubs. GI: Soft, nondistended, nontender, no rebound pain, no organomegaly, BS present. GU: No hematuria Ext: No pitting leg edema bilaterally. 2+DP/PT pulse bilaterally. Musculoskeletal: No joint deformities, No joint redness or warmth, no limitation of ROM in spin. Skin: No rashes.  Neuro: Alert, oriented X3, cranial nerves II-XII grossly intact, moves all extremities normally. Psych: Patient is not psychotic, no suicidal or hemocidal ideation.  Labs on Admission: I have personally reviewed following labs and imaging studies  CBC: Recent Labs  Lab 03/20/18 0019  WBC 3.2*  HGB 3.7*  HCT 12.1*  MCV 80.7  PLT 32*   Basic Metabolic Panel: Recent Labs  Lab 03/20/18 0019  NA 139  K 4.8  CL 106  CO2 18*  GLUCOSE 255*  BUN 70*  CREATININE 1.79*  CALCIUM 8.5*   GFR: CrCl cannot be calculated (Unknown ideal weight.). Liver Function Tests: Recent Labs  Lab 03/20/18 0019  AST 35    ALT 25  ALKPHOS 57  BILITOT 0.4  PROT 6.1*  ALBUMIN 2.2*   No results for input(s): LIPASE, AMYLASE in the last 168 hours. No results for input(s): AMMONIA in the last 168 hours. Coagulation Profile: No results for input(s): INR, PROTIME in the last 168 hours. Cardiac Enzymes: No results for input(s): CKTOTAL, CKMB, CKMBINDEX, TROPONINI in the last 168 hours. BNP (last 3 results) No results for input(s): PROBNP in the last 8760 hours. HbA1C: No results for input(s): HGBA1C in the last 72 hours. CBG: No results for input(s): GLUCAP in the last 168 hours. Lipid Profile: No results for input(s): CHOL, HDL, LDLCALC, TRIG, CHOLHDL, LDLDIRECT in the last 72 hours. Thyroid Function Tests: No results for input(s): TSH, T4TOTAL, FREET4, T3FREE,  THYROIDAB in the last 72 hours. Anemia Panel: No results for input(s): VITAMINB12, FOLATE, FERRITIN, TIBC, IRON, RETICCTPCT in the last 72 hours. Urine analysis:    Component Value Date/Time   COLORURINE YELLOW 07/07/2015 1916   APPEARANCEUR CLEAR 07/07/2015 1916   LABSPEC 1.010 07/07/2015 1916   PHURINE 5.5 07/07/2015 1916   GLUCOSEU NEGATIVE 07/07/2015 1916   HGBUR SMALL (A) 07/07/2015 1916   BILIRUBINUR NEGATIVE 07/07/2015 1916   BILIRUBINUR negative 07/05/2015 1801   KETONESUR NEGATIVE 07/07/2015 1916   PROTEINUR NEGATIVE 07/07/2015 1916   UROBILINOGEN 0.2 07/05/2015 1801   NITRITE NEGATIVE 07/07/2015 1916   LEUKOCYTESUR NEGATIVE 07/07/2015 1916   Sepsis Labs: @LABRCNTIP (procalcitonin:4,lacticidven:4) )No results found for this or any previous visit (from the past 240 hour(s)).   Radiological Exams on Admission: Dg Chest Port 1 View  Result Date: 03/20/2018 CLINICAL DATA:  Hypotension. Mantle cell lymphoma and prostate carcinoma. EXAM: PORTABLE CHEST 1 VIEW COMPARISON:  07/07/2015 FINDINGS: The heart size is within normal limits. Stable ectasia of thoracic aorta. Both lungs are clear. The visualized skeletal structures are  unremarkable. IMPRESSION: No active disease. Electronically Signed   By: Earle Gell M.D.   On: 03/20/2018 01:29     EKG: Independently reviewed.  Sinus rhythm, QTC 441, T wave flattening, early R wave progression.   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  Patient has hypotension and generalized weakness. This is likely due to mantle cell lymphoma.  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  Followed up with Dr. Alen Blew.  Patient was taking oral chemotherapy which was discontinued 1 month ago due to intolerance. -Follow-up with Dr. Alen Blew  Prostate cancer: s/p of TUR.  No chemo or radiation therapy.  PSA 7.2 on 02/18/2018. -Follow-up with oncology  Protein-calorie malnutrition, severe ([place]): -Nutrition consult  Acute renal failure superimposed on stage 3 chronic kidney disease ([place]): Baseline Cre is 1.2-1.4, pt's Cre is 1.79 and BUN 70 on admission. Likely due to multifactorial etiology, including possible ATN 2/2 hypotension, dehydration. -IV fluid as above - Blood transfusion as above -Check urinalysis   Hx of Stroke [facility]): not on any medications.  No acute issues. -Observe closely  Hypotension: Most likely due to severe anemia and possible dehydration. -IV fluid and blood transfusion as above  Syncope: Most likely due to severe anemia and hypotension.  No focal neurologic findings on physical examination.  Currently patient's  mental status is normal. - IV fluid and blood transfusion as above  DVT ppx: SCD Code Status: Full code Family Communication:      Yes, patient's wife   at bed side Disposition Plan:  Anticipate discharge back to previous home environment Consults called: None Admission status: Obs / tele    Date of Service 03/20/2018    Ivor Costa [facility] Pager [phone]  If 7PM-7AM, please contact night-coverage [email or web address] Password [facility] 03/20/2018, 2:47 AM

## 2018-03-20 NOTE — ED Notes (Signed)
ED TO INPATIENT HANDOFF REPORT  Name/Age/Gender Joel [name] 77 y.o. male  Code Status    Code Status Orders  (From admission, onward)         Start     Ordered   03/20/18 0200  Full code  Continuous     03/20/18 0200        Code Status History    Date Active Date Inactive Code Status Order ID Comments User Context   07/28/2015 2119 08/01/2015 1831 Full Code [id number]  Ivor Costa, MD ED   07/08/2015 0053 07/13/2015 2315 Full Code [id number]  Ivor Costa, MD ED      Home/SNF/Other Home  Chief Complaint Altered Mental Status  Level of Care/Admitting Diagnosis ED Disposition    ED Disposition Condition [place]: [place] [100100]  Level of Care: Telemetry [5]  I expect the patient will be discharged within 24 hours: No (not a candidate for 5C-Observation unit)  Diagnosis: Symptomatic anemia [7824235]  Admitting Physician: Ivor Costa [[id number]]  Attending Physician: Ivor Costa [[id number]]  PT Class (Do Not Modify): Observation [104]  PT Acc Code (Do Not Modify): Observation [10022]       Medical History Past Medical History:  Diagnosis Date  . Anemia   . Aneurysm ([place])    in 1987, brain aneurysm  . Bilateral hydronephrosis   . History of radiation therapy 04/17/17- 05/07/17   Right neck treated to 32.5 Gy with 13 fx of 2.5 Gy  . mantle cell lymphoma dx'd 03/2017  . Mass of right side of neck   . Pneumonia    hx of   . Prostate CA ([place]) dx'd 2017  . Urinary retention     Allergies No Known Allergies  IV Location/Drains/Wounds Patient Lines/Drains/Airways Status   Active Line/Drains/Airways    Name:   Placement date:   Placement time:   Site:   Days:   Peripheral IV 03/20/18 Left Antecubital   03/20/18    0009    Antecubital   less than 1   Peripheral IV 03/20/18 Right Hand   03/20/18    0213    Hand   less than 1   Incision (Closed) 07/30/15 Perineum Other (Comment)   07/30/15    0842     964   Incision (Closed) 03/27/17  Neck Right   03/27/17    0933     358          Labs/Imaging Results for orders placed or performed during the hospital encounter of 03/19/18 (from the past 48 hour(s))  Comprehensive metabolic panel     Status: Abnormal   Collection Time: 03/20/18 12:19 AM  Result Value Ref Range   Sodium 139 135 - 145 mmol/L   Potassium 4.8 3.5 - 5.1 mmol/L   Chloride 106 98 - 111 mmol/L   CO2 18 (L) 22 - 32 mmol/L   Glucose, Bld 255 (H) 70 - 99 mg/dL   BUN 70 (H) 8 - 23 mg/dL   Creatinine, Ser 1.79 (H) 0.61 - 1.24 mg/dL   Calcium 8.5 (L) 8.9 - 10.3 mg/dL   Total Protein 6.1 (L) 6.5 - 8.1 g/dL   Albumin 2.2 (L) 3.5 - 5.0 g/dL   AST 35 15 - 41 U/L   ALT 25 0 - 44 U/L   Alkaline Phosphatase 57 38 - 126 U/L   Total Bilirubin 0.4 0.3 - 1.2 mg/dL   GFR calc non Af Amer 36 (L) >60 mL/min  GFR calc Af Amer 42 (L) >60 mL/min   Anion gap 15 5 - 15    Comment: Performed at [facility] [address], [place] [address]  CBC     Status: Abnormal   Collection Time: 03/20/18 12:19 AM  Result Value Ref Range   WBC 3.2 (L) 4.0 - 10.5 K/uL   RBC 1.50 (L) 4.22 - 5.81 MIL/uL   Hemoglobin 3.7 (LL) 13.0 - 17.0 g/dL    Comment: This critical result has verified and been called to K Jamilya Sarrazin by Lorrine Kin on 11 28 2019 at [place], and has been read back. CRITICAL RESULTS VERIFIED   HCT 12.1 (L) 39.0 - 52.0 %   MCV 80.7 80.0 - 100.0 fL   MCH 24.7 (L) 26.0 - 34.0 pg   MCHC 30.6 30.0 - 36.0 g/dL   RDW 14.9 11.5 - 15.5 %   Platelets 32 (L) 150 - 400 K/uL    Comment: REPEATED TO VERIFY PLATELET COUNT CONFIRMED BY SMEAR SPECIMEN CHECKED FOR CLOTS Immature Platelet Fraction may be clinically indicated, consider ordering this additional test [id number]    nRBC 0.0 0.0 - 0.2 %    Comment: Performed at [facility], [place] [address], [place], [place] [address]  Type and screen     Status: None (Preliminary result)   Collection Time: 03/20/18 12:20 AM  Result Value Ref Range   ABO/RH(D) O POS     Antibody Screen NEG    Sample Expiration 03/23/2018    Unit Number [id number]    Blood Component Type RED CELLS,LR    Unit division 00    Status of Unit ALLOCATED    Transfusion Status OK TO TRANSFUSE    Crossmatch Result Compatible    Unit Number [id number]    Blood Component Type RED CELLS,LR    Unit division 00    Status of Unit ISSUED    Transfusion Status OK TO TRANSFUSE    Crossmatch Result      Compatible Performed at [facility], [place] [address], [place], [place] [address]    Unit Number [id number]    Blood Component Type RED CELLS,LR    Unit division 00    Status of Unit ALLOCATED    Transfusion Status OK TO TRANSFUSE    Crossmatch Result Compatible    Unit Number [id number]    Blood Component Type RED CELLS,LR    Unit division 00    Status of Unit ALLOCATED    Transfusion Status OK TO TRANSFUSE    Crossmatch Result Compatible    Unit Number [id number]    Blood Component Type RED CELLS,LR    Unit division 00    Status of Unit ALLOCATED    Transfusion Status OK TO TRANSFUSE    Crossmatch Result Compatible   Prepare RBC     Status: None   Collection Time: 03/20/18  1:24 AM  Result Value Ref Range   Order Confirmation      ORDER PROCESSED BY BLOOD BANK Performed at [facility], [place] [address], [place], [place] [address]    Dg Chest Port 1 View  Result Date: 03/20/2018 CLINICAL DATA:  Hypotension. Mantle cell lymphoma and prostate carcinoma. EXAM: PORTABLE CHEST 1 VIEW COMPARISON:  07/07/2015 FINDINGS: The heart size is within normal limits. Stable ectasia of thoracic aorta. Both lungs are clear. The visualized skeletal structures are unremarkable. IMPRESSION: No active disease. Electronically Signed   By: Sharrie Rothman.D.  On: 03/20/2018 01:29   None  Pending Labs Unresulted Labs (From admission, onward)    Start     Ordered   03/21/18 0000  Vitamin B12  (Anemia Panel (PNL))  Once,   R     03/20/18 0158   03/21/18 0000   Folate  (Anemia Panel (PNL))  Once,   R     03/20/18 0158   03/21/18 0000  Iron and TIBC  (Anemia Panel (PNL))  Once,   R     03/20/18 0158   03/21/18 0000  Ferritin  (Anemia Panel (PNL))  Once,   R     03/20/18 0158   03/21/18 0000  Reticulocytes  (Anemia Panel (PNL))  Once,   R     03/20/18 0158   03/20/18 0104  Urinalysis, Routine w reflex microscopic  ONCE - STAT,   STAT     03/20/18 0103          Vitals/Pain Today's Vitals   03/20/18 0003 03/20/18 0106 03/20/18 0202 03/20/18 0220  BP: 115/75 (!) 105/58 107/70 115/63  Pulse: 99 91 96 90  Resp: 19 19 (!) 21 19  Temp: 98.5 F (36.9 C)   98.2 F (36.8 C)  TempSrc: Oral   Oral  SpO2: 100% 100% 100%   PainSc: 0-No pain       Isolation Precautions No active isolations  Medications Medications  0.9 %  sodium chloride infusion (has no administration in time range)  acetaminophen (TYLENOL) tablet 650 mg (has no administration in time range)    Or  acetaminophen (TYLENOL) suppository 650 mg (has no administration in time range)  ondansetron (ZOFRAN) tablet 4 mg (has no administration in time range)    Or  ondansetron (ZOFRAN) injection 4 mg (has no administration in time range)  senna-docusate (Senokot-S) tablet 1 tablet (has no administration in time range)  zolpidem (AMBIEN) tablet 5 mg (has no administration in time range)  sodium chloride 0.9 % bolus 500 mL (0 mLs Intravenous Stopped 03/20/18 0227)    Mobility walks

## 2018-03-20 NOTE — ED Provider Notes (Signed)
[facility] EMERGENCY DEPARTMENT Provider Note   CSN: [id number] Arrival date & time: 03/19/18  2355     History   Chief Complaint Chief Complaint  Patient presents with  . Altered Mental Status    HPI Joel [name] is a 77 y.o. male.  HPI  77 year old male with history of prostate cancer, mantle cell lymphoma currently in remission and prior episodes of anemia that have required transfusion comes in with chief complaint of syncope.  Patient states that he has been feeling unwell for the last 1 month.  They discussed his weakness with the oncologist, who discontinued his oral chemotherapy agent, suspecting that the weakness was a result of his medications.  Patient has continued to have fatigue and increase dyspnea on exertion.  Today while he was sitting on the table he had a syncopal episode without any prodrome.  Patient denies any blood loss.  Family also reports he is not eating or drinking well.  Past Medical History:  Diagnosis Date  . Anemia   . Aneurysm ([place])    in 1987, brain aneurysm  . Bilateral hydronephrosis   . History of radiation therapy 04/17/17- 05/07/17   Right neck treated to 32.5 Gy with 13 fx of 2.5 Gy  . mantle cell lymphoma dx'd 03/2017  . Mass of right side of neck   . Pneumonia    hx of   . Prostate CA ([place]) dx'd 2017  . Urinary retention     Patient Active Problem List   Diagnosis Date Noted  . Stroke ([place]) 04/22/2017  . Prostate cancer ([place]) 04/12/2017  . 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 Bladder outlet obstruction 08/01/2015  . Anemia 07/29/2015  . Elevated serum creatinine   . Elevated prostate specific antigen (PSA) 07/28/2015  . CKD (chronic kidney disease), stage IV ([place]) 07/28/2015  . Urinary retention 07/28/2015  . AKI (acute kidney injury) ([place]) 07/08/2015  . Normocytic anemia 07/08/2015  . Hyperkalemia 07/08/2015  . Elevated blood pressure 07/08/2015  . Renal  failure 07/08/2015  . Diarrhea 07/08/2015  . Protein-calorie malnutrition, severe ([place]) 07/08/2015  . Bilateral leg edema 07/08/2015    Past Surgical History:  Procedure Laterality Date  . MASS BIOPSY Right 03/27/2017   Procedure: OPEN NECK MASS BIOPSY OF THE RIGHT NECK;  Surgeon: Leta Baptist, MD;  Location: [facility];  Service: ENT;  Laterality: Right;  . PROSTATE BIOPSY N/A 07/30/2015   Procedure: PROSTATE BIOPSY;  Surgeon: Irine Seal, MD;  Location: [facility];  Service: Urology;  Laterality: N/A;  . TRANSURETHRAL RESECTION OF PROSTATE N/A 07/30/2015   Procedure: TRANSURETHRAL RESECTION OF THE PROSTATE (TURP);  Surgeon: Irine Seal, MD;  Location: [facility];  Service: Urology;  Laterality: N/A;        Home Medications    Prior to Admission medications   Medication Sig Start Date End Date Taking? Authorizing Provider  IMBRUVICA 560 MG TABS TAKE 1 TABLET (560 MG) BY MOUTH DAILY. TAKE WITH A FULL GLASS OF WATER AT APPROXIMATELY THE SAME TIME EACH DAY. Patient not taking: Reported on 03/20/2018 03/19/18   Wyatt Portela, MD  megestrol (MEGACE) 400 MG/10ML suspension Take 10 mLs (400 mg total) by mouth 2 (two) times daily. Patient not taking: Reported on 03/20/2018 03/10/18   Wyatt Portela, MD    Family History Family History  Problem Relation Age of Onset  . Dementia Mother   . Heart disease Father   . Lung  cancer Brother   . Lupus Sister     Social History Social History   Tobacco Use  . Smoking status: Former Smoker    Last attempt to quit: 04/28/1985    Years since quitting: 32.9  . Smokeless tobacco: Former Systems developer    Types: Chew  . Tobacco comment: quit 30-40 yrs. aog  Substance Use Topics  . Alcohol use: No    Alcohol/week: 0.0 standard drinks  . Drug use: No     Allergies   Patient has no known allergies.   Review of Systems Review of Systems  Constitutional: Positive for activity change.  Respiratory: Positive for shortness of breath.   Allergic/Immunologic: Negative  for immunocompromised state.  Neurological: Positive for syncope and weakness.  All other systems reviewed and are negative.    Physical Exam Updated Vital Signs BP (!) 105/58   Pulse 91   Temp 98.5 F (36.9 C) (Oral)   Resp 19   SpO2 100%   Physical Exam  Constitutional: He is oriented to person, place, and time. He appears well-developed.  HENT:  Head: Atraumatic.  Neck: Neck supple.  Cardiovascular: Normal rate.  Pulmonary/Chest: Effort normal.  Abdominal: He exhibits no mass.  Neurological: He is alert and oriented to person, place, and time.  Skin: Skin is warm. There is pallor.  Nursing note and vitals reviewed.    ED Treatments / Results  Labs (all labs ordered are listed, but only abnormal results are displayed) Labs Reviewed  COMPREHENSIVE METABOLIC PANEL - Abnormal; Notable for the following components:      Result Value   CO2 18 (*)    Glucose, Bld 255 (*)    BUN 70 (*)    Creatinine, Ser 1.79 (*)    Calcium 8.5 (*)    Total Protein 6.1 (*)    Albumin 2.2 (*)    GFR calc non Af Amer 36 (*)    GFR calc Af Amer 42 (*)    All other components within normal limits  CBC - Abnormal; Notable for the following components:   WBC 3.2 (*)    RBC 1.50 (*)    Hemoglobin 3.7 (*)    HCT 12.1 (*)    MCH 24.7 (*)    Platelets 32 (*)    All other components within normal limits  URINALYSIS, ROUTINE W REFLEX MICROSCOPIC  TYPE AND SCREEN  PREPARE RBC (CROSSMATCH)    EKG EKG Interpretation  Date/Time:  Thursday March 20 2018 00:02:30 EST Ventricular Rate:  98 PR Interval:    QRS Duration: 88 QT Interval:  345 QTC Calculation: 441 R Axis:   55 Text Interpretation:  Sinus rhythm Borderline T wave abnormalities No acute changes Nonspecific ST and T wave abnormality Confirmed by Varney Biles ([id number]) on 03/20/2018 4:02:25 AM   Radiology No results found.  Procedures .Critical Care Performed by: Varney Biles, MD Authorized by: Varney Biles, MD     Critical care provider statement:    Critical care time (minutes):  32   Critical care start time:  03/20/2018 1:00 AM   Critical care end time:  03/20/2018 1:28 AM   Critical care was time spent personally by me on the following activities:  Discussions with consultants, evaluation of patient's response to treatment, examination of patient, ordering and performing treatments and interventions, ordering and review of laboratory studies, ordering and review of radiographic studies, pulse oximetry, re-evaluation of patient's condition, obtaining history from patient or surrogate and review of old charts   (including  critical care time)  Medications Ordered in ED Medications  0.9 %  sodium chloride infusion (has no administration in time range)     Initial Impression / Assessment and Plan / ED Course  I have reviewed the triage vital signs and the nursing notes.  Pertinent labs & imaging results that were available during my care of the patient were reviewed by me and considered in my medical decision making (see chart for details).     77 year old male with history of lymphoma comes in with chief complaint of syncope.  He allegedly is in remission right now and was taking oral chemotherapy which was discontinued few days ago because his weakness was deemed to be secondary to the medications.  Patient is noted to be severely anemic with hemoglobin of 3.7.  He is actually pancytopenic, platelet count is 32 and white count is 3.2.  History is not suggestive of any underlying infection.  Patient also declines any bloody stools.  Plan is to start transfusion.  The anemia appears to be related to possibly dysfunctional bone marrow.  Anemia panel has been sent.  Stable for admission.  Final Clinical Impressions(s) / ED Diagnoses   Final diagnoses:  Pancytopenia ([place])  Symptomatic anemia  Syncope and collapse    ED Discharge Orders    None       Varney Biles, MD 03/20/18  [phone]

## 2018-03-20 NOTE — ED Notes (Signed)
Attempted report, left call back number

## 2018-03-20 NOTE — Plan of Care (Signed)

## 2018-03-21 DIAGNOSIS — D649 Anemia, unspecified: Secondary | ICD-10-CM | POA: Diagnosis not present

## 2018-03-21 LAB — COMPREHENSIVE METABOLIC PANEL
ALT: 22 U/L (ref 0–44)
AST: 26 U/L (ref 15–41)
Albumin: 2.1 g/dL — ABNORMAL LOW (ref 3.5–5.0)
Alkaline Phosphatase: 53 U/L (ref 38–126)
Anion gap: 8 (ref 5–15)
BUN: 48 mg/dL — ABNORMAL HIGH (ref 8–23)
CO2: 21 mmol/L — ABNORMAL LOW (ref 22–32)
Calcium: 8.4 mg/dL — ABNORMAL LOW (ref 8.9–10.3)
Chloride: 112 mmol/L — ABNORMAL HIGH (ref 98–111)
Creatinine, Ser: 1.33 mg/dL — ABNORMAL HIGH (ref 0.61–1.24)
GFR calc Af Amer: >60 mL/min (ref >60)
GFR calc non Af Amer: 52 mL/min — ABNORMAL LOW (ref >60)
Glucose, Bld: 103 mg/dL — ABNORMAL HIGH (ref 70–99)
Potassium: 4.2 mmol/L (ref 3.5–5.1)
SODIUM: 141 mmol/L (ref 135–145)
Total Bilirubin: 0.7 mg/dL (ref 0.3–1.2)
Total Protein: 5.6 g/dL — ABNORMAL LOW (ref 6.5–8.1)

## 2018-03-21 LAB — TYPE AND SCREEN
ABO/RH(D): O POS
Antibody Screen: NEGATIVE
UNIT DIVISION: 0
UNIT DIVISION: 0
Unit division: 0
Unit division: 0
Unit division: 0

## 2018-03-21 LAB — CBC
HCT: 29.6 % — ABNORMAL LOW (ref 39.0–52.0)
HCT: 31.1 % — ABNORMAL LOW (ref 39.0–52.0)
HEMOGLOBIN: 10.2 g/dL — AB (ref 13.0–17.0)
Hemoglobin: 9.8 g/dL — ABNORMAL LOW (ref 13.0–17.0)
MCH: 27.1 pg (ref 26.0–34.0)
MCH: 27.2 pg (ref 26.0–34.0)
MCHC: 33.1 g/dL (ref 30.0–36.0)
MCHC: 32.8 g/dL (ref 30.0–36.0)
MCV: 81.8 fL (ref 80.0–100.0)
MCV: 82.9 fL (ref 80.0–100.0)
Platelets: 25 10*3/uL — CL (ref 150–400)
Platelets: 24 10*3/uL — CL (ref 150–400)
RBC: 3.62 MIL/uL — ABNORMAL LOW (ref 4.22–5.81)
RBC: 3.75 MIL/uL — ABNORMAL LOW (ref 4.22–5.81)
RDW: 14.5 % (ref 11.5–15.5)
RDW: 15.2 % (ref 11.5–15.5)
WBC: 2.1 10*3/uL — ABNORMAL LOW (ref 4.0–10.5)
WBC: 2.5 10*3/uL — ABNORMAL LOW (ref 4.0–10.5)
nRBC: 0 % (ref 0.0–0.2)
nRBC: 0 % (ref 0.0–0.2)

## 2018-03-21 LAB — PHOSPHORUS: Phosphorus: 3.7 mg/dL (ref 2.5–4.6)

## 2018-03-21 LAB — BPAM RBC
BLOOD PRODUCT EXPIRATION DATE: 201912212359
BLOOD PRODUCT EXPIRATION DATE: 201912252359
Blood Product Expiration Date: 201912212359
Blood Product Expiration Date: 201912252359
Blood Product Expiration Date: 201912252359
ISSUE DATE / TIME: 201911280213
ISSUE DATE / TIME: 201911280408
ISSUE DATE / TIME: 201911280856
ISSUE DATE / TIME: 201911281356
ISSUE DATE / TIME: 201911282042
UNIT TYPE AND RH: 5100
Unit Type and Rh: 5100
Unit Type and Rh: 5100
Unit Type and Rh: 5100
Unit Type and Rh: 5100

## 2018-03-21 LAB — MAGNESIUM: Magnesium: 2.3 mg/dL (ref 1.7–2.4)

## 2018-03-21 MED ORDER — ENSURE ENLIVE PO LIQD: 237.0000 mL | Freq: Two times a day (BID) | ORAL | 12 refills | Status: DC

## 2018-03-21 NOTE — Care Management Note (Addendum)
Case Management Note Marvetta Gibbons RN, BSN Transitions of Care Unit 4E- RN Case Manager [phone]  Patient Details  Name: Joel [name] MRN: [id number] Date of Birth: [birth year]  Subjective/Objective:    Pt presented with symptomatic anemia, syncope                Action/Plan: PTA pt lived at home with wife, independent, per PT eval recommendation for [facility], no DME needs. Spoke with pt and family at bedside, choice offered for [facility] agency - per pt and family- would like to use [facility] with [facility] as back up if [facility] unable to accept referral. Confirmed PCP, home address and contact #'s in epic. Pt to return home with family. Referral called to [facility] with Alvis Lemmings for HHRN/PT- awaiting return call.  Return call received from [facility] with [facility]- referral has been accepted- start of care will be by Monday 12/2- family aware and agreeable.   Expected Discharge Date:  03/21/18               Expected Discharge Plan:  [place]  In-House Referral:  NA  Discharge planning Services  CM Consult  Post Acute Care Choice:  Home Health Choice offered to:  Patient, Adult Children, Spouse  DME Arranged:  N/A DME Agency:  NA  HH Arranged:  RN, PT [place] Agency:  [facility]  Status of Service:  Completed, signed off  If discussed at [place] of Stay Meetings, dates discussed:    Discharge Disposition: home/home health   Additional Comments:  Dawayne Patricia, RN 03/21/2018, 3:54 PM

## 2018-03-21 NOTE — Evaluation (Signed)
Physical Therapy Evaluation Patient Details Name: Joel [name] MRN: [id number] DOB: 10/18/1940 Today's Date: 03/21/2018   History of Present Illness  Pt is a 77 y.o. M with significant PMH of stroke, mantle cell lymphoma, prostate CA, anemia, CKD-4, who presents with syncope and generalized weakness. Found to have a hemoglobin of 3.7.  Clinical Impression  Pt admitted with above diagnosis. Pt currently with functional limitations due to the deficits listed below (see PT Problem List). Patient presenting with decreased functional mobility secondary to decreased endurance and balance deficits. Ambulating 100 feet with no assistive device and min guard assist to steady. Negotiated 3 steps with railings and min guard assist. PT educated pt daughter on guarding technique; pt daughter verbalized understanding. Pt will benefit from skilled PT to increase their independence and safety with mobility to allow discharge to the venue listed below.       Follow Up Recommendations Home health PT    Equipment Recommendations  None recommended by PT    Recommendations for Other Services       Precautions / Restrictions Precautions Precautions: Fall Restrictions Weight Bearing Restrictions: No      Mobility  Bed Mobility               General bed mobility comments: OOB in chair  Transfers Overall transfer level: Needs assistance Equipment used: None Transfers: Sit to/from Stand Sit to Stand: Supervision         General transfer comment: good technique  Ambulation/Gait Ambulation/Gait assistance: Min guard Gait Distance (Feet): 100 Feet Assistive device: None Gait Pattern/deviations: Step-through pattern;Decreased dorsiflexion - right;Decreased dorsiflexion - left Gait velocity: decreased   General Gait Details: Patient with slow cadence and slightly guarded posture, mild unsteadiness noted. Decreased reciprocal arm swing  Stairs Stairs: Yes Stairs assistance: Min  guard Stair Management: Two rails Number of Stairs: 3 General stair comments: Min guard for safety  Wheelchair Mobility    Modified Rankin (Stroke Patients Only)       Balance Overall balance assessment: Mild deficits observed, not formally tested                                           Pertinent Vitals/Pain Pain Assessment: No/denies pain    Home Living Family/patient expects to be discharged to:: Private residence Living Arrangements: Spouse/significant other Available Help at Discharge: Family;Available 24 hours/day Type of Home: House Home Access: Stairs to enter   [employer] of Steps: 3 Home Layout: One level Home Equipment: None      Prior Function Level of Independence: Independent         Comments: works part time as a Nurse, adult        Extremity/Trunk Assessment   Upper Extremity Assessment Upper Extremity Assessment: RUE deficits/detail;LUE deficits/detail RUE Deficits / Details: Active shoulder flexion to 100 degrees. Strength grossly 4/5 LUE Deficits / Details: Active shoulder flexion to 100 degrees. Strength grossly 4/5    Lower Extremity Assessment Lower Extremity Assessment: RLE deficits/detail;LLE deficits/detail RLE Deficits / Details: Strength 5/5 LLE Deficits / Details: Strength 5/5       Communication   Communication: No difficulties  Cognition Arousal/Alertness: Awake/alert Behavior During Therapy: WFL for tasks assessed/performed Overall Cognitive Status: Within Functional Limits for tasks assessed  General Comments General comments (skin integrity, edema, etc.): Pt daughter present    Exercises     Assessment/Plan    PT Assessment Patient needs continued PT services  PT Problem List Decreased strength;Decreased activity tolerance;Decreased balance;Decreased mobility       PT Treatment Interventions Gait  training;Stair training;Functional mobility training;Therapeutic activities;Therapeutic exercise;Balance training;Patient/family education    PT Goals (Current goals can be found in the Care Plan section)  Acute Rehab PT Goals Patient Stated Goal: "return to work when able." PT Goal Formulation: With patient Time For Goal Achievement: 04/04/18 Potential to Achieve Goals: Good    Frequency Min 3X/week   Barriers to discharge        Co-evaluation               AM-PAC PT "6 Clicks" Mobility  Outcome Measure Help needed turning from your back to your side while in a flat bed without using bedrails?: None Help needed moving from lying on your back to sitting on the side of a flat bed without using bedrails?: None Help needed moving to and from a bed to a chair (including a wheelchair)?: A Little Help needed standing up from a chair using your arms (e.g., wheelchair or bedside chair)?: None Help needed to walk in hospital room?: A Little Help needed climbing 3-5 steps with a railing? : A Little 6 Click Score: 21    End of Session Equipment Utilized During Treatment: Gait belt Activity Tolerance: Patient tolerated treatment well Patient left: in chair;with call bell/phone within reach;with family/visitor present Nurse Communication: Mobility status PT Visit Diagnosis: Unsteadiness on feet (R26.81)    Time: 6160-7371 PT Time Calculation (min) (ACUTE ONLY): 22 min   Charges:   PT Evaluation $PT Eval Moderate Complexity: 1 Mod          Ellamae Sia, PT, DPT Acute Rehabilitation Services Pager [phone] Office [phone]   Willy Eddy 03/21/2018, 9:46 AM

## 2018-03-21 NOTE — Discharge Summary (Signed)
**Note De-Identified vi Obfusction** Physicin Dischrge Summry  Phroh Pio NOB:[id number] DOB: 1941-04-16 DOA: 03/19/2018  PCP: [name], MD  Admit dte: 03/19/2018 Dischrge dte: 03/21/2018  Time spent: 35 minutes  Recommendtions for Outptient Follow-up:  1. Follow up outptient CBC/CMP 2. Ensure follow up with Dr. Alen Blew s scheduled next week  3. Follow up decresed ppetite  Dischrge Dignoses:  Principl Problem:   Symptomtic nemi Active Problems:   Protein-clorie mlnutrition, severe (HCC)   Acute renl filure superimposed on stge 3 chronic kidney disese (HCC)   Mntle cell lymphom of lymph nodes of hed, fce, nd neck (HCC)   Prostte cncer (HCC)   Stroke ([place])   Syncope   Hypotension   Pncytopeni ([place])   Dischrge Condition: stble  Diet recommendtion: hert helthy  Filed Weights   03/20/18 0508  Weight: 52 kg    History of present illness:  77 y.o.mlew/  hx of stroke, mntle cell lymphom, prostte cncer, nemi, nd CKD 4 who presented with cute syncope nd  month of progressive generlized wekness. Denied drk stool, hemturi orhemtochezi.Ptient ws trnsfused with 2 units of blood one month prior due to nemi.   He ws found to hve  hemoglobin of 3.7.  He ws trnsfused 5 units pRBC nd his Hb ws 10.2 on the dy of dischrge.  Mr. Gbrielson ws feeling much better t this time.  Discussed cse with Dr. Alen Blew who noted pt ok to follow up outptient.   Hospitl Course:   Symptomtic nemi Hgbdropped from 8.2 on 02/18/18 > 3.7 - due tomntlecell lymphom - s/p 5U PRBC.  No si/sx bleeding.  No melen or BRBPR.  Hb stble t dischrge tody nd pt feels much better.  Hb Stble t dischrge t 10.2.  Mntle cell lymphom  s/p rdition therpy He ws on imbruvic, but this ws held 2/2 pncytopeni (continue to hold this) Follow up with Dr. Alen Blew s outptient   Pncytopeni  Relted to his mntle cell, discussed with Dr. Alen Blew who noted  ok to follow up s outptient  Prostte cncer Follow up with Dr. Alen Blew s outptient. No chemo or rdition therpy - PSA 7.2 on 02/18/2018. Per Dr. Hzeline Junker note, he's currently on lupron  Protein-clorie mlnutrition, severe  Decresed ppetie Nutrition consult Likely 2/2 bove, f/u outptient  Acute renl filure superimposed on stge 3 CKD bseline cr 1.2-1.4 Resolved  Hx ofStroke   Hypotension due to severe nemi   Syncope due to severe nemi nd hypotension  Procedures:  none   Consulttions:  Shdd over phone  Dischrge Exm: Vitls:   03/21/18 0429 03/21/18 1147  BP: 113/79 112/76  Pulse: 73   Resp: 18 18  Temp: 98.2 F (36.8 C) 97.6 F (36.4 C)  SpO2: 99% 99%   Feels better post trnsfusion. Redy to d/c tody. Dughter nd wife t bedside. Still hs decresed ppetite.  Generl: No cute distress. Cchectic  Crdiovsculr: Hert sounds show  regulr rte, nd rhythm.  Lungs: Cler to usculttion bilterlly  Abdomen: Soft, nontender, nondistended Neurologicl: Alert nd oriented 3. Moves ll extremities 4 . Crnil nerves II through XII grossly intct. Skin: Wrm nd dry. No rshes or lesions. Extremities: No clubbing or cynosis. No edem.  Psychitric: Mood nd ffect re norml. Insight nd judgment re pprorpite.  Dischrge Instructions   Dischrge Instructions    Cll MD for:  difficulty brething, hedche or visul disturbnces   Complete by:  As directed    Cll MD for:  extreme ftigue   Complete by:  As directed    Call MD for:  hives   Complete by:  As directed    Call MD for:  persistant dizziness or light-headedness   Complete by:  As directed    Call MD for:  persistant nausea and vomiting   Complete by:  As directed    Call MD for:  redness, tenderness, or signs of infection (pain, swelling, redness, odor or green/yellow discharge around incision site)   Complete by:  As directed    Call MD for:   severe uncontrolled pain   Complete by:  As directed    Call MD for:  temperature >100.4   Complete by:  As directed    Diet - low sodium heart healthy   Complete by:  As directed    Discharge instructions   Complete by:  As directed    You were seen for low blood counts.  You improved after transfusion and your counts were stable.  Please follow up with your PCP or Dr. Alen Blew for repeat labs next week.  Stop the imbruvica until you follow up with Dr. Alen Blew.   Please follow up with Dr. Alen Blew as scheduled.    Return for new, recurrent, or worsening symptoms.  Please ask your PCP to request records from this hospitalization so they know what was done and what the next steps will be.   Increase activity slowly   Complete by:  As directed      Allergies as of 03/21/2018   No Known Allergies     Medication List    STOP taking these medications   IMBRUVICA 560 MG Tabs Generic drug:  Ibrutinib     TAKE these medications   feeding supplement (ENSURE ENLIVE) Liqd Take 237 mLs by mouth 2 (two) times daily between meals. Start taking on:  03/22/2018   megestrol 400 MG/10ML suspension Commonly known as:  MEGACE Take 10 mLs (400 mg total) by mouth 2 (two) times daily.      No Known Allergies Follow-up Information    [name], MD Follow up.   Specialty:  Nephrology Contact information: [place] [address] [phone]        Wyatt Portela, MD Follow up.   Specialty:  Oncology Why:  Please follow up as scheduled Contact information: [place]  [address] [phone]            The results of significant diagnostics from this hospitalization (including imaging, microbiology, ancillary and laboratory) are listed below for reference.    Significant Diagnostic Studies: Dg Chest Port 1 View  Result Date: 03/20/2018 CLINICAL DATA:  Hypotension. Mantle cell lymphoma and prostate carcinoma. EXAM: PORTABLE  CHEST 1 VIEW COMPARISON:  07/07/2015 FINDINGS: The heart size is within normal limits. Stable ectasia of thoracic aorta. Both lungs are clear. The visualized skeletal structures are unremarkable. IMPRESSION: No active disease. Electronically Signed   By: Earle Gell M.D.   On: 03/20/2018 01:29    Microbiology: No results found for this or any previous visit (from the past 240 hour(s)).   Labs: Basic Metabolic Panel: Recent Labs  Lab 03/20/18 0019 03/21/18 0354  NA 139 141  K 4.8 4.2  CL 106 112*  CO2 18* 21*  GLUCOSE 255* 103*  BUN 70* 48*  CREATININE 1.79* 1.33*  CALCIUM 8.5* 8.4*  MG  --  2.3  PHOS  --  3.7   Liver Function Tests: Recent Labs  Lab 03/20/18 0019 03/21/18 0354  AST 35 26  ALT 25 22  ALKPHOS 57 53  BILITOT 0.4 0.7  PROT 6.1* 5.6*  ALBUMIN 2.2* 2.1*   No results for input(s): LIPASE, AMYLASE in the last 168 hours. No results for input(s): AMMONIA in the last 168 hours. CBC: Recent Labs  Lab 03/20/18 0019 03/21/18 0354 03/21/18 1424  WBC 3.2* 2.1* 2.5*  HGB 3.7* 9.8* 10.2*  HCT 12.1* 29.6* 31.1*  MCV 80.7 81.8 82.9  PLT 32* 25* 24*   Cardiac Enzymes: No results for input(s): CKTOTAL, CKMB, CKMBINDEX, TROPONINI in the last 168 hours. BNP: BNP (last 3 results) No results for input(s): BNP in the last 8760 hours.  ProBNP (last 3 results) No results for input(s): PROBNP in the last 8760 hours.  CBG: No results for input(s): GLUCAP in the last 168 hours.     Signed:  Fayrene Helper MD.  [facility] 03/21/2018, 3:06 PM

## 2018-03-21 NOTE — Progress Notes (Signed)
D/c instructions given to patient. Medications reviewed. Telemetry removed. IV removed, clean and intact. Wife to escort home.   Clyde Canterbury, RN

## 2018-03-21 NOTE — Progress Notes (Signed)
CRITICAL VALUE ALERT  Critical Value:  Platelets 25   Date & Time Notied: 03/21/2018, 1438  Provider Notified: on call [facility] MD text paged   Orders Received/Actions taken: none

## 2018-03-26 ENCOUNTER — Inpatient Hospital Stay: Payer: Medicare HMO | Attending: Oncology | Admitting: Oncology

## 2018-03-26 ENCOUNTER — Telehealth: Payer: Self-pay | Admitting: Oncology

## 2018-03-26 ENCOUNTER — Inpatient Hospital Stay: Payer: Medicare HMO

## 2018-03-26 VITALS — BP 119/71 | HR 90 | Temp 97.8°F | Resp 18 | Ht 67.0 in | Wt 124.4 lb

## 2018-03-26 DIAGNOSIS — C8310 Mantle cell lymphoma, unspecified site: Secondary | ICD-10-CM | POA: Diagnosis not present

## 2018-03-26 DIAGNOSIS — Z923 Personal history of irradiation: Secondary | ICD-10-CM | POA: Insufficient documentation

## 2018-03-26 DIAGNOSIS — C8311 Mantle cell lymphoma, lymph nodes of head, face, and neck: Secondary | ICD-10-CM

## 2018-03-26 DIAGNOSIS — D61818 Other pancytopenia: Secondary | ICD-10-CM | POA: Insufficient documentation

## 2018-03-26 DIAGNOSIS — Z79899 Other long term (current) drug therapy: Secondary | ICD-10-CM | POA: Diagnosis not present

## 2018-03-26 DIAGNOSIS — R413 Other amnesia: Secondary | ICD-10-CM | POA: Insufficient documentation

## 2018-03-26 DIAGNOSIS — D649 Anemia, unspecified: Secondary | ICD-10-CM | POA: Diagnosis not present

## 2018-03-26 DIAGNOSIS — R9721 Rising PSA following treatment for malignant neoplasm of prostate: Secondary | ICD-10-CM | POA: Diagnosis not present

## 2018-03-26 DIAGNOSIS — C61 Malignant neoplasm of prostate: Secondary | ICD-10-CM | POA: Diagnosis not present

## 2018-03-26 DIAGNOSIS — D6489 Other specified anemias: Secondary | ICD-10-CM

## 2018-03-26 LAB — CMP (CANCER CENTER ONLY)
ALT: 124 U/L — AB (ref 0–44)
AST: 75 U/L — AB (ref 15–41)
Albumin: 2.5 g/dL — ABNORMAL LOW (ref 3.5–5.0)
Alkaline Phosphatase: 71 U/L (ref 38–126)
Anion gap: 10 (ref 5–15)
BUN: 36 mg/dL — AB (ref 8–23)
CALCIUM: 8.6 mg/dL — AB (ref 8.9–10.3)
CO2: 22 mmol/L (ref 22–32)
CREATININE: 1.06 mg/dL (ref 0.61–1.24)
Chloride: 110 mmol/L (ref 98–111)
GFR, Est AFR Am: >60 mL/min (ref >60)
GFR, Estimated: >60 mL/min (ref >60)
Glucose, Bld: 112 mg/dL — ABNORMAL HIGH (ref 70–99)
Potassium: 4.4 mmol/L (ref 3.5–5.1)
Sodium: 142 mmol/L (ref 135–145)
Total Bilirubin: 0.3 mg/dL (ref 0.3–1.2)
Total Protein: 5.9 g/dL — ABNORMAL LOW (ref 6.5–8.1)

## 2018-03-26 LAB — CBC WITH DIFFERENTIAL (CANCER CENTER ONLY)
ABS IMMATURE GRANULOCYTES: 0 10*3/uL (ref 0.00–0.07)
Band Neutrophils: 1 %
Basophils Absolute: 0 10*3/uL (ref 0.0–0.1)
Basophils Relative: 0 %
Eosinophils Absolute: 0 10*3/uL (ref 0.0–0.5)
Eosinophils Relative: 0 %
HEMATOCRIT: 29.5 % — AB (ref 39.0–52.0)
Hemoglobin: 9.7 g/dL — ABNORMAL LOW (ref 13.0–17.0)
LYMPHS ABS: 1 10*3/uL (ref 0.7–4.0)
Lymphocytes Relative: 91 %
MCH: 27.9 pg (ref 26.0–34.0)
MCHC: 32.9 g/dL (ref 30.0–36.0)
MCV: 84.8 fL (ref 80.0–100.0)
MONOS PCT: 1 %
Monocytes Absolute: 0 10*3/uL — ABNORMAL LOW (ref 0.1–1.0)
Neutro Abs: 0.1 10*3/uL — CL (ref 1.7–17.7)
Neutrophils Relative %: 7 %
Platelet Count: 20 10*3/uL — ABNORMAL LOW (ref 150–400)
RBC: 3.48 MIL/uL — ABNORMAL LOW (ref 4.22–5.81)
RDW: 16.4 % — ABNORMAL HIGH (ref 11.5–15.5)
WBC Count: 1.1 10*3/uL — ABNORMAL LOW (ref 4.0–10.5)
nRBC: 0 % (ref 0.0–0.2)

## 2018-03-26 NOTE — Progress Notes (Signed)
Hematology and Oncology Follow Up Visit  Joel [name] [id number] 07/09/1940 77 y.o. 03/26/2018 12:20 PM Joel [name], MDSanford, Joel Leeds, MD   Principle Diagnosis: 77 year old man with:  1.  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   2.  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    Prior Therapy:   He is status post excisional biopsy of a right neck mass completed on 03/27/2017 with the pathology confirming mantle cell lymphoma.  He is status post palliative radiation therapy to his cervical adenopathy completed in January 2019.   Current therapy:   Imbruvica 560 mg daily in December 2018.  Therapy has been on hold because of pancytopenia since October 2019.  Lupron 30 mg once every 4 months since January 2019.   Interim History: Joel [name] returns today for a repeat evaluation.  Since the last visit, he was hospitalized on 03/20/2018 after presenting with hemoglobin of 3.7 white cell count of 3.2 and platelets of 32.  He received 5 units of packed red cell transfusion with hemoglobin appropriately improved to 10.2.  He reports feeling better since his discharge.  His appetite is improving slowly although we have lost weight overall.  He denies any palpable adenopathy or constitutional symptoms.  Still overall weak but still ambulating without any difficulties.    He does not report any headaches, blurry vision, syncope or seizures.  He denies any alteration in mental status or confusion.  He does not report any fevers, chills, sweats.   He does not report any chest pain, palpitation, orthopnea or leg edema.  He does not report any cough, wheezing or hemoptysis.  He does not report any nausea, vomiting or early satiety.    He denies any changes in bowel habits.  He does not report any frequency urgency or hesitancy.   He does not report any bone pain or pathological fractures.  He does  not report any ecchymosis or petechiae.  Remaining review of systems is negative.  Medications: I have reviewed the patient's current medications.  Current Outpatient Medications  Medication Sig Dispense Refill  . feeding supplement, ENSURE ENLIVE, (ENSURE ENLIVE) LIQD Take 237 mLs by mouth 2 (two) times daily between meals. 237 mL 12  . megestrol (MEGACE) 400 MG/10ML suspension Take 10 mLs (400 mg total) by mouth 2 (two) times daily. (Patient not taking: Reported on 03/20/2018) 240 mL 1   No current facility-administered medications for this visit.      Allergies: No Known Allergies  Past Medical History, Surgical history, Social history, and Family History discussed today and remained unchanged.  Physical Exam:  Blood pressure 119/71, pulse 90, temperature 97.8 F (36.6 C), temperature source Oral, resp. rate 18, height _0  (1.702 m), weight 124 lb 6.4 oz (56.4 kg), SpO2 100 %.   ECOG:1    General appearance: Comfortable appearing without any discomfort Head: Normocephalic without any trauma Oropharynx: Mucous membranes are moist and pink without any thrush or ulcers. Eyes: Pupils are equal and round reactive to light. Lymph nodes: No cervical, supraclavicular, inguinal or axillary lymphadenopathy.   Heart:regular rate and rhythm.  S1 and S2 without leg edema. Lung: Clear without any rhonchi or wheezes.  No dullness to percussion. Abdomin: Soft, nontender, nondistended with good bowel sounds.  No hepatosplenomegaly. Musculoskeletal: No joint deformity or effusion.  Full range of motion noted. Neurological: No deficits noted on motor, sensory and deep tendon reflex exam. Skin: No  petechial rash or dryness.  Appeared moist.      Lab Results: Lab Results  Component Value Date   WBC 1.1 (L) 03/26/2018   HGB 9.7 (L) 03/26/2018   HCT 29.5 (L) 03/26/2018   MCV 84.8 03/26/2018   PLT PENDING 03/26/2018     Chemistry      Component Value Date/Time   NA 141 03/21/2018  0354   NA 141 04/04/2017 1030   K 4.2 03/21/2018 0354   K 4.4 04/04/2017 1030   CL 112 (H) 03/21/2018 0354   CO2 21 (L) 03/21/2018 0354   CO2 20 (L) 04/04/2017 1030   BUN 48 (H) 03/21/2018 0354   BUN 29.2 (H) 04/04/2017 1030   CREATININE 1.33 (H) 03/21/2018 0354   CREATININE 2.06 (H) 02/18/2018 1448   CREATININE 1.7 (H) 04/04/2017 1030      Component Value Date/Time   CALCIUM 8.4 (L) 03/21/2018 0354   CALCIUM 8.8 04/04/2017 1030   ALKPHOS 53 03/21/2018 0354   ALKPHOS 101 04/04/2017 1030   AST 26 03/21/2018 0354   AST 25 02/18/2018 1448   AST 31 04/04/2017 1030   ALT 22 03/21/2018 0354   ALT 20 02/18/2018 1448   ALT 10 04/04/2017 1030   BILITOT 0.7 03/21/2018 0354   BILITOT 0.4 02/18/2018 1448   BILITOT 0.56 04/04/2017 1030       IMPRESSION: 1. Since the 04/25/2017 PET, response to therapy of adenopathy within the neck, abdomen, and pelvis, as detailed above. 2. No evidence of new or progressive disease. 3. Improvement in right upper and left lower lobe areas of likely infectious/inflammatory nodularity, bronchiectasis, and mucoid impaction. 4.  Aortic Atherosclerosis (ICD10-I70.0). 5. Prostatomegaly. Suspect a component of bladder outlet obstruction, as evidenced by wall thickening.    Impression and Plan:  77 year old man with:  1.  Stage IIIA Mantle cell lymphoma presented with lymphadenopathy and pancytopenia diagnosed in December 2018.   He has been on Imbruvica with excellent radiographic response based on CT scan obtained on 02/18/2018 showed positive response to therapy.  Despite these findings, he still has profound pancytopenia of unclear etiology.  The differential diagnosis was reviewed today with the patient and his family.  Bone marrow involvement with the lymphoma is a possibility and obtaining bone marrow biopsy is critical at this time to complete his staging.  Depending on these findings different salvage therapy for his lymphoma may be needed in  the form of systemic chemotherapy.  Risks and benefits of a bone marrow biopsy procedure was reviewed today.  These complications include bleeding, infection among others.  He is agreeable to proceed.    2.    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  He has been on Lupron although his PSA is rising which could indicate castration resistant disease.  I will repeat his PSA in the near future and will assess his disease status at this time.  Depending on his lymphoma status different salvage therapy for his prostate cancer may be needed.  3.  Anemia: He is status post recent transfusion with improvement in his hemoglobin.  The cause of his anemia remains unclear at this time could be related to bone marrow infiltrative process.  See no evidence to suggest hemolysis at this time.  His hemoglobin is adequate today and does not require any transfusion.  4.  Thrombocytopenia: No active bleeding noted at this time.  Will transfuse as needed.  5.  Prognosis: He has 2 incurable malignancies that are advanced at this time.  Has performance status but is becoming marginal and aggressive therapy although warranted but needs to be reevaluated in the future.  5.  Follow-up:  In 2 weeks to follow his progress.  25 minutes was spent with the patient face-to-face today.  More than 50% of time was dedicated to discussing his disease status, differential diagnosis of these findings, management options and coordinating plan of care.       Zola Button, MD 12/4/201912:20 PM

## 2018-03-26 NOTE — Telephone Encounter (Signed)
Gave patient avs report and appointments for December. Lab/transfusion for 12/10 pending contact w/[facility]. Patient aware I will call re 12/10 appointment.

## 2018-03-26 NOTE — Telephone Encounter (Signed)
Added lab at [facility] 12/10 at 8 am and PRBC's at Patient Joel [name] [facility]) at 107 am. Spoke with wife. Patient/family provided with information/instruction re getting to Patient [place] prior to leaving today. Updated schedule mailed.

## 2018-03-28 ENCOUNTER — Other Ambulatory Visit: Payer: Self-pay | Admitting: Radiology

## 2018-03-31 ENCOUNTER — Other Ambulatory Visit: Payer: Self-pay

## 2018-03-31 ENCOUNTER — Ambulatory Visit (HOSPITAL_COMMUNITY)
Admission: RE | Admit: 2018-03-31 | Discharge: 2018-03-31 | Disposition: A | Discharge status: Home / Self Care | Payer: Medicare HMO | Source: Ambulatory Visit | Attending: Oncology | Admitting: Oncology

## 2018-03-31 ENCOUNTER — Encounter (HOSPITAL_COMMUNITY): Payer: Self-pay

## 2018-03-31 DIAGNOSIS — Z8546 Personal history of malignant neoplasm of prostate: Secondary | ICD-10-CM | POA: Diagnosis not present

## 2018-03-31 DIAGNOSIS — Z79899 Other long term (current) drug therapy: Secondary | ICD-10-CM | POA: Diagnosis not present

## 2018-03-31 DIAGNOSIS — C8310 Mantle cell lymphoma, unspecified site: Secondary | ICD-10-CM | POA: Insufficient documentation

## 2018-03-31 DIAGNOSIS — D61818 Other pancytopenia: Secondary | ICD-10-CM | POA: Diagnosis present

## 2018-03-31 DIAGNOSIS — D6489 Other specified anemias: Secondary | ICD-10-CM | POA: Insufficient documentation

## 2018-03-31 LAB — CBC WITH DIFFERENTIAL/PLATELET
Basophils Absolute: 0 10*3/uL (ref 0.0–0.1)
Basophils Relative: 0 %
Eosinophils Absolute: 0 10*3/uL (ref 0.0–0.5)
Eosinophils Relative: 0 %
HCT: 24.5 % — ABNORMAL LOW (ref 39.0–52.0)
Hemoglobin: 7.7 g/dL — ABNORMAL LOW (ref 13.0–17.0)
Lymphocytes Relative: 85 %
Lymphs Abs: 1.2 10*3/uL (ref 0.7–4.0)
MCH: 28.2 pg (ref 26.0–34.0)
MCHC: 31.4 g/dL (ref 30.0–36.0)
MCV: 89.7 fL (ref 80.0–100.0)
Monocytes Absolute: 0.1 10*3/uL (ref 0.1–1.0)
Monocytes Relative: 7 %
NEUTROS ABS: 0.1 10*3/uL — AB (ref 1.7–7.7)
Neutrophils Relative %: 7 %
Platelets: 25 10*3/uL — CL (ref 150–400)
RBC: 2.73 MIL/uL — ABNORMAL LOW (ref 4.22–5.81)
RDW: 16.6 % — ABNORMAL HIGH (ref 11.5–15.5)
WBC: 1.4 10*3/uL — CL (ref 4.0–10.5)
nRBC: 0 % (ref 0.0–0.2)

## 2018-03-31 LAB — PROTIME-INR
INR: 1.14
Prothrombin Time: 14.5 seconds (ref 11.4–15.2)

## 2018-03-31 MED ORDER — FLUMAZENIL 0.5 MG/5ML IV SOLN
INTRAVENOUS | Status: AC
  Filled 2018-03-31: qty 5

## 2018-03-31 MED ORDER — MIDAZOLAM HCL 2 MG/2ML IJ SOLN
INTRAMUSCULAR | Status: AC | PRN
  Administered 2018-03-31: 1 mg via INTRAVENOUS

## 2018-03-31 MED ORDER — FENTANYL CITRATE (PF) 100 MCG/2ML IJ SOLN
INTRAMUSCULAR | Status: AC
  Filled 2018-03-31: qty 2

## 2018-03-31 MED ORDER — SODIUM CHLORIDE 0.9 % IV SOLN
INTRAVENOUS | Status: DC
  Administered 2018-03-31: 09:00:00 via INTRAVENOUS

## 2018-03-31 MED ORDER — MIDAZOLAM HCL 2 MG/2ML IJ SOLN
INTRAMUSCULAR | Status: AC
  Filled 2018-03-31: qty 2

## 2018-03-31 MED ORDER — LIDOCAINE HCL (PF) 1 % IJ SOLN
INTRAMUSCULAR | Status: AC | PRN
  Administered 2018-03-31: 10 mL via SUBCUTANEOUS

## 2018-03-31 MED ORDER — FENTANYL CITRATE (PF) 100 MCG/2ML IJ SOLN
INTRAMUSCULAR | Status: AC | PRN
  Administered 2018-03-31: 50 ug via INTRAVENOUS

## 2018-03-31 MED ORDER — NALOXONE HCL 0.4 MG/ML IJ SOLN
INTRAMUSCULAR | Status: AC
  Filled 2018-03-31: qty 1

## 2018-03-31 MED ORDER — HYDROCODONE-ACETAMINOPHEN 5-325 MG PO TABS: 1.0000 | ORAL_TABLET | ORAL | Status: DC | PRN

## 2018-03-31 NOTE — Procedures (Signed)
  Procedure: CT bone marrow  R iliac EBL:   minimal Complications:  none immediate  See full dictation in [employer].  Dillard Cannon MD Main # [phone] Pager  [phone]

## 2018-03-31 NOTE — Discharge Instructions (Addendum)
Moderate Conscious Sedation, Adult, Care After These instructions provide you with information about caring for yourself after your procedure. Your health care provider may also give you more specific instructions. Your treatment has been planned according to current medical practices, but problems sometimes occur. Call your health care provider if you have any problems or questions after your procedure. What can I expect after the procedure? After your procedure, it is common:  To feel sleepy for several hours.  To feel clumsy and have poor balance for several hours.  To have poor judgment for several hours.  To vomit if you eat too soon.  Follow these instructions at home: For at least 24 hours after the procedure:   Do not: ? Participate in activities where you could fall or become injured. ? Drive. ? Use heavy machinery. ? Drink alcohol. ? Take sleeping pills or medicines that cause drowsiness. ? Make important decisions or sign legal documents. ? Take care of children on your own.  Rest. Eating and drinking  Follow the diet recommended by your health care provider.  If you vomit: ? Drink water, juice, or soup when you can drink without vomiting. ? Make sure you have little or no nausea before eating solid foods. General instructions  Have a responsible adult stay with you until you are awake and alert.  Take over-the-counter and prescription medicines only as told by your health care provider.  If you smoke, do not smoke without supervision.  Keep all follow-up visits as told by your health care provider. This is important. Contact a health care provider if:  You keep feeling nauseous or you keep vomiting.  You feel light-headed.  You develop a rash.  You have a fever. Get help right away if:  You have trouble breathing. This information is not intended to replace advice given to you by your health care provider. Make sure you discuss any questions you have  with your health care provider. Document Released: 01/28/2013 Document Revised: 09/12/2015 Document Reviewed: 07/30/2015 Elsevier Interactive Patient Education  2018 [place].  Bone Marrow Aspiration and Bone Marrow Biopsy, Adult, Care After This sheet gives you information about how to care for yourself after your procedure. Your health care provider may also give you more specific instructions. If you have problems or questions, contact your health care provider. What can I expect after the procedure? After the procedure, it is common to have:  Mild pain and tenderness.  Swelling.  Bruising.  Follow these instructions at home:  Take over-the-counter or prescription medicines only as told by your health care provider.  Do not take baths, swim, or use a hot tub until your health care provider approves. Ask if you can take a shower or have a sponge bath.  You may shower tomorrow.  Follow instructions from your health care provider about how to take care of the puncture site. Make sure you: ? Wash your hands with soap and water before you change your bandage (dressing). If soap and water are not available, use hand sanitizer. ? Change your dressing as told by your health care provider.  You may remove your dressing tomorrow.  Check your puncture siteevery day for signs of infection. Check for: ? More redness, swelling, or pain. ? More fluid or blood. ? Warmth. ? Pus or a bad smell.  Return to your normal activities as told by your health care provider. Ask your health care provider what activities are safe for you.  Do not drive for  for 24 hours if you were given a medicine to help you relax (sedative). °· Keep all follow-up visits as told by your health care provider. This is important. °Contact a health care provider if: °· You have more redness, swelling, or pain around the puncture site. °· You have more fluid or blood coming from the puncture site. °· Your puncture site feels  warm to the touch. °· You have pus or a bad smell coming from the puncture site. °· You have a fever. °· Your pain is not controlled with medicine. °This information is not intended to replace advice given to you by your health care provider. Make sure you discuss any questions you have with your health care provider. °Document Released: 10/27/2004 Document Revised: 10/28/2015 Document Reviewed: 09/21/2015 °Elsevier Interactive Patient Education © 2018 Elsevier Inc. ° °

## 2018-03-31 NOTE — H&P (Signed)
Referring Physician(s): Wyatt Portela  Supervising Physician: Arne Cleveland  Patient Status:  [facility] OP   Chief Complaint: "I'm here for a bone marrow test"  Subjective: Pt familiar to IR service from prior BM biopsy in 2018. He has a hx of advanced prostate cancer diagnosed in 2017 as well as mantle cell lymphoma in 2018 with prior tx. He now presents with pancytopenia of unknown etiology and is scheduled today for repeat CT guided bone marrow biopsy for further evaluation. He denies fever,HA,CP,dyspnea,cough, abd/back pain,N/V or bleeding.   Past Medical History:  Diagnosis Date  . Anemia   . Aneurysm ([place])    in 1987, brain aneurysm  . Bilateral hydronephrosis   . History of radiation therapy 04/17/17- 05/07/17   Right neck treated to 32.5 Gy with 13 fx of 2.5 Gy  . mantle cell lymphoma dx'd 03/2017  . Mass of right side of neck   . Pneumonia    hx of   . Prostate CA ([place]) dx'd 2017  . Urinary retention    Past Surgical History:  Procedure Laterality Date  . MASS BIOPSY Right 03/27/2017   Procedure: OPEN NECK MASS BIOPSY OF THE RIGHT NECK;  Surgeon: Leta Baptist, MD;  Location: [place];  Service: ENT;  Laterality: Right;  . PROSTATE BIOPSY N/A 07/30/2015   Procedure: PROSTATE BIOPSY;  Surgeon: Irine Seal, MD;  Location: [facility];  Service: Urology;  Laterality: N/A;  . TRANSURETHRAL RESECTION OF PROSTATE N/A 07/30/2015   Procedure: TRANSURETHRAL RESECTION OF THE PROSTATE (TURP);  Surgeon: Irine Seal, MD;  Location: [facility];  Service: Urology;  Laterality: N/A;       Allergies: Patient has no known allergies.  Medications: Prior to Admission medications   Medication Sig Start Date End Date Taking? Authorizing Provider  feeding supplement, ENSURE ENLIVE, (ENSURE ENLIVE) LIQD Take 237 mLs by mouth 2 (two) times daily between meals. 03/22/18  Yes Elodia Florence., MD  megestrol (MEGACE) 400 MG/10ML suspension Take 10 mLs (400 mg total) by mouth 2 (two) times  daily. Patient not taking: Reported on 03/20/2018 03/10/18   Wyatt Portela, MD     Vital Signs: BP 112/64  HR 76  R 16  TEMP 98.6  O2 SATS 98% RA   Physical Exam awake/alert; chest- CTA bilat; heart- RRR; abd- soft,+BS,NT; trace bilat LE edema  Imaging: No results found.  Labs:  CBC: Recent Labs    03/20/18 0019 03/21/18 0354 03/21/18 1424 03/26/18 1125  WBC 3.2* 2.1* 2.5* 1.1*  HGB 3.7* 9.8* 10.2* 9.7*  HCT 12.1* 29.6* 31.1* 29.5*  PLT 32* 25* 24* 20*    COAGS: Recent Labs    04/15/17 0740 03/31/18 0919  INR 1.14 1.14  APTT 30  --     BMP: Recent Labs    02/18/18 1448 03/20/18 0019 03/21/18 0354 03/26/18 1125  NA 138 139 141 142  K 4.1 4.8 4.2 4.4  CL 105 106 112* 110  CO2 22 18* 21* 22  GLUCOSE 114* 255* 103* 112*  BUN 54* 70* 48* 36*  CALCIUM 9.1 8.5* 8.4* 8.6*  CREATININE 2.06* 1.79* 1.33* 1.06  GFRNONAA 30* 36* 52* >60  GFRAA 35* 42* >60 >60    LIVER FUNCTION TESTS: Recent Labs    02/18/18 1448 03/20/18 0019 03/21/18 0354 03/26/18 1125  BILITOT 0.4 0.4 0.7 0.3  AST 25 35 26 75*  ALT 20 25 22  124*  ALKPHOS 87 57 53 71  PROT 6.9 6.1* 5.6* 5.9*  ALBUMIN 2.3* 2.2* 2.1* 2.5*    Assessment and Plan:  Pt with hx of advanced prostate cancer diagnosed in 2017 as well as mantle cell lymphoma in 2018 with prior tx. He now presents with pancytopenia of unknown etiology and is scheduled today for repeat CT guided bone marrow biopsy for further evaluation.Risks and benefits discussed with the patient including, but not limited to bleeding, infection, damage to adjacent structures or low yield requiring additional tests.  All of the patient's questions were answered, patient is agreeable to proceed. Consent signed and in chart.     Electronically Signed: D. Rowe Robert, PA-C 03/31/2018, 9:45 AM   I spent a total of 20 minutes at the the patient's bedside AND on the patient's hospital floor or unit, greater than 50% of which was  counseling/coordinating care for CT guided bone marrow biopsy

## 2018-04-01 ENCOUNTER — Ambulatory Visit (HOSPITAL_COMMUNITY)
Admission: RE | Admit: 2018-04-01 | Discharge: 2018-04-01 | Disposition: A | Discharge status: Home / Self Care | Payer: Medicare HMO | Source: Ambulatory Visit | Attending: Oncology | Admitting: Oncology

## 2018-04-01 ENCOUNTER — Inpatient Hospital Stay: Payer: Medicare HMO

## 2018-04-01 ENCOUNTER — Other Ambulatory Visit: Payer: Self-pay

## 2018-04-01 DIAGNOSIS — D649 Anemia, unspecified: Secondary | ICD-10-CM | POA: Insufficient documentation

## 2018-04-01 DIAGNOSIS — C61 Malignant neoplasm of prostate: Secondary | ICD-10-CM | POA: Diagnosis not present

## 2018-04-01 DIAGNOSIS — C8311 Mantle cell lymphoma, lymph nodes of head, face, and neck: Secondary | ICD-10-CM

## 2018-04-01 DIAGNOSIS — D6489 Other specified anemias: Secondary | ICD-10-CM

## 2018-04-01 LAB — CBC WITH DIFFERENTIAL (CANCER CENTER ONLY)
Abs Immature Granulocytes: 0.06 10*3/uL (ref 0.00–0.07)
Basophils Absolute: 0 10*3/uL (ref 0.0–0.1)
Basophils Relative: 0 %
Eosinophils Absolute: 0 10*3/uL (ref 0.0–0.5)
Eosinophils Relative: 0 %
HCT: 23.9 % — ABNORMAL LOW (ref 39.0–52.0)
Hemoglobin: 7.6 g/dL — ABNORMAL LOW (ref 13.0–17.0)
IMMATURE GRANULOCYTES: 6 %
Lymphocytes Relative: 74 %
Lymphs Abs: 0.8 10*3/uL (ref 0.7–4.0)
MCH: 27.7 pg (ref 26.0–34.0)
MCHC: 31.8 g/dL (ref 30.0–36.0)
MCV: 87.2 fL (ref 80.0–100.0)
Monocytes Absolute: 0.1 10*3/uL (ref 0.1–1.0)
Monocytes Relative: 11 %
NRBC: 0 % (ref 0.0–0.2)
Neutro Abs: 0.1 10*3/uL — CL (ref 1.7–7.7)
Neutrophils Relative %: 9 %
Platelet Count: 30 10*3/uL — ABNORMAL LOW (ref 150–400)
RBC: 2.74 MIL/uL — AB (ref 4.22–5.81)
RDW: 16.4 % — AB (ref 11.5–15.5)
WBC: 1.1 10*3/uL — AB (ref 4.0–10.5)

## 2018-04-01 LAB — PREPARE RBC (CROSSMATCH)

## 2018-04-01 LAB — SAMPLE TO BLOOD BANK

## 2018-04-01 MED ORDER — HEPARIN SOD (PORK) LOCK FLUSH 100 UNIT/ML IV SOLN: 500.0000 [IU] | Freq: Every day | INTRAVENOUS | Status: DC | PRN

## 2018-04-01 MED ORDER — HEPARIN SOD (PORK) LOCK FLUSH 100 UNIT/ML IV SOLN: 250.0000 [IU] | INTRAVENOUS | Status: DC | PRN

## 2018-04-01 MED ORDER — SODIUM CHLORIDE 0.9% FLUSH: 3.0000 mL | INTRAVENOUS | Status: DC | PRN

## 2018-04-01 MED ORDER — SODIUM CHLORIDE 0.9% IV SOLUTION: 250.0000 mL | Freq: Once | INTRAVENOUS | Status: DC

## 2018-04-01 MED ORDER — SODIUM CHLORIDE 0.9% FLUSH: 10.0000 mL | INTRAVENOUS | Status: DC | PRN

## 2018-04-01 NOTE — Progress Notes (Signed)
Patient received 2 units of packed RBC via PIV as ordered. Tolerated well, vitals stable, discharge instructions given, verbalized understanding. Patient alert, oriented and ambulatory at the time of discharge in the company of his wife.Joel [name]

## 2018-04-01 NOTE — Discharge Instructions (Signed)
Blood Transfusion, Adult, Care After This sheet gives you information about how to care for yourself after your procedure. Your health care provider may also give you more specific instructions. If you have problems or questions, contact your health care provider. What can I expect after the procedure? After your procedure, it is common to have:  Bruising and soreness where the IV tube was inserted.  Headache.  Follow these instructions at home:  Take over-the-counter and prescription medicines only as told by your health care provider.  Return to your normal activities as told by your health care provider.  Follow instructions from your health care provider about how to take care of your IV insertion site. Make sure you: ? Wash your hands with soap and water before you change your bandage (dressing). If soap and water are not available, use hand sanitizer. ? Change your dressing as told by your health care provider.  Check your IV insertion site every day for signs of infection. Check for: ? More redness, swelling, or pain. ? More fluid or blood. ? Warmth. ? Pus or a bad smell. Contact a health care provider if:  You have more redness, swelling, or pain around the IV insertion site.  You have more fluid or blood coming from the IV insertion site.  Your IV insertion site feels warm to the touch.  You have pus or a bad smell coming from the IV insertion site.  Your urine turns pink, red, or brown.  You feel weak after doing your normal activities. Get help right away if:  You have signs of a serious allergic or immune system reaction, including: ? Itchiness. ? Hives. ? Trouble breathing. ? Anxiety. ? Chest or lower back pain. ? Fever, flushing, and chills. ? Rapid pulse. ? Rash. ? Diarrhea. ? Vomiting. ? Dark urine. ? Serious headache. ? Dizziness. ? Stiff neck. ? Yellow coloration of the face or the white parts of the eyes (jaundice). This information is not  intended to replace advice given to you by your health care provider. Make sure you discuss any questions you have with your health care provider. Document Released: 04/30/2014 Document Revised: 12/07/2015 Document Reviewed: 10/24/2015 Elsevier Interactive Patient Education  2018 Elsevier Inc.  

## 2018-04-02 LAB — BPAM RBC
BLOOD PRODUCT EXPIRATION DATE: 201912302359
Blood Product Expiration Date: 201912282359
ISSUE DATE / TIME: 201912101056
ISSUE DATE / TIME: 201912101056
Unit Type and Rh: 5100
Unit Type and Rh: 5100

## 2018-04-02 LAB — TYPE AND SCREEN
ABO/RH(D): O POS
Antibody Screen: NEGATIVE
Unit division: 0
Unit division: 0

## 2018-04-02 LAB — PROSTATE-SPECIFIC AG, SERUM (LABCORP): Prostate Specific Ag, Serum: 4.1 ng/mL — ABNORMAL HIGH (ref 0.0–4.0)

## 2018-04-08 ENCOUNTER — Inpatient Hospital Stay: Payer: Medicare HMO

## 2018-04-08 ENCOUNTER — Inpatient Hospital Stay (HOSPITAL_BASED_OUTPATIENT_CLINIC_OR_DEPARTMENT_OTHER): Payer: Medicare HMO | Admitting: Oncology

## 2018-04-08 ENCOUNTER — Telehealth: Payer: Self-pay

## 2018-04-08 VITALS — BP 136/79 | HR 80 | Temp 98.1°F | Resp 16 | Ht 67.0 in | Wt 131.4 lb

## 2018-04-08 DIAGNOSIS — R413 Other amnesia: Secondary | ICD-10-CM | POA: Diagnosis not present

## 2018-04-08 DIAGNOSIS — D649 Anemia, unspecified: Secondary | ICD-10-CM | POA: Diagnosis not present

## 2018-04-08 DIAGNOSIS — C8311 Mantle cell lymphoma, lymph nodes of head, face, and neck: Secondary | ICD-10-CM

## 2018-04-08 DIAGNOSIS — C61 Malignant neoplasm of prostate: Secondary | ICD-10-CM | POA: Diagnosis not present

## 2018-04-08 DIAGNOSIS — D6489 Other specified anemias: Secondary | ICD-10-CM

## 2018-04-08 LAB — CBC WITH DIFFERENTIAL (CANCER CENTER ONLY)
Abs Immature Granulocytes: 0.1 10*3/uL — ABNORMAL HIGH (ref 0.00–0.07)
Basophils Absolute: 0 10*3/uL (ref 0.0–0.1)
Basophils Relative: 1 %
Eosinophils Absolute: 0 10*3/uL (ref 0.0–0.5)
Eosinophils Relative: 0 %
HEMATOCRIT: 27 % — AB (ref 39.0–52.0)
Hemoglobin: 8.6 g/dL — ABNORMAL LOW (ref 13.0–17.0)
Immature Granulocytes: 6 %
Lymphocytes Relative: 46 %
Lymphs Abs: 0.8 10*3/uL (ref 0.7–4.0)
MCH: 28.3 pg (ref 26.0–34.0)
MCHC: 31.9 g/dL (ref 30.0–36.0)
MCV: 88.8 fL (ref 80.0–100.0)
Monocytes Absolute: 0.4 10*3/uL (ref 0.1–1.0)
Monocytes Relative: 20 %
Neutro Abs: 0.5 10*3/uL — ABNORMAL LOW (ref 1.7–7.7)
Neutrophils Relative %: 27 %
Platelet Count: 61 10*3/uL — ABNORMAL LOW (ref 150–400)
RBC: 3.04 MIL/uL — ABNORMAL LOW (ref 4.22–5.81)
RDW: 16 % — ABNORMAL HIGH (ref 11.5–15.5)
WBC Count: 1.7 10*3/uL — ABNORMAL LOW (ref 4.0–10.5)
nRBC: 2.9 % — ABNORMAL HIGH (ref 0.0–0.2)

## 2018-04-08 LAB — SAMPLE TO BLOOD BANK

## 2018-04-08 NOTE — Telephone Encounter (Signed)
Printed avs and calender of upcoming appointment. Per 12/17 los 

## 2018-04-08 NOTE — Progress Notes (Signed)
Hematology and Oncology Follow Up Visit  Joel [name] [id number] 03/30/1941 77 y.o. 04/08/2018 10:34 AM Joel [name], MDSanford, [name], MD   Principle Diagnosis: 77 year old man with:  1. 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  At that time he had a had a PSA of 390.  2.  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   Prior Therapy:   He is status post excisional biopsy of a right neck mass completed on 03/27/2017 with the pathology confirming mantle cell lymphoma.  He is status post palliative radiation therapy to his cervical adenopathy completed in January 2019.   Imbruvica 560 mg daily in December 2018.  Therapy has been on hold because of pancytopenia since October 2019.   Current therapy:   Lupron 30 mg once every 4 months since January 2019.   Interim History: Joel [name] is here for a repeat evaluation.  Since last visit, he reports overall improvement in his health.  He has been eating better and has gained more weight since last visit close to 7 pounds at this time.  He has not used Megace or nutritional supplements and overall felt more energetic and performing activities of daily living.  His family has reported some memory issues that has been chronic in nature.  He denies any bleeding issues or recurrent hospitalizations.  He is able to drive and has not had any dysphasia or odontophagia.    He does not report any headaches, blurry vision, syncope or seizures.  He has been forgetful but no lethargy or dizziness.  He does not report any fevers, chills, sweats.   He does not report any chest pain, palpitation, orthopnea or leg edema.  He does not report any cough, wheezing or hemoptysis.  He does not report any nausea, vomiting or abdominal distention.   He denies any constipation or diarrhea.  He does not report any frequency urgency or hesitancy.   He does not report any paralysis or  myalgias.  He does not report any easy bruising or bleeding tendency.  Remaining review of systems is negative.  Medications: I have reviewed the patient's current medications.  Current Outpatient Medications  Medication Sig Dispense Refill  . feeding supplement, ENSURE ENLIVE, (ENSURE ENLIVE) LIQD Take 237 mLs by mouth 2 (two) times daily between meals. 237 mL 12  . megestrol (MEGACE) 400 MG/10ML suspension Take 10 mLs (400 mg total) by mouth 2 (two) times daily. (Patient not taking: Reported on 03/20/2018) 240 mL 1   No current facility-administered medications for this visit.      Allergies: No Known Allergies  Past Medical History, Surgical history, Social history, and Family History discussed today and remained unchanged.  Physical Exam:  Blood pressure 136/79, pulse 80, temperature 98.1 F (36.7 C), temperature source Oral, resp. rate 16, height _0  (1.702 m), weight 131 lb 6.4 oz (59.6 kg), SpO2 100 %.   ECOG:1    General appearance: Alert, awake without any distress. Head: Atraumatic without abnormalities Oropharynx: Without any thrush or ulcers. Eyes: No scleral icterus. Lymph nodes: No lymphadenopathy noted in the cervical, supraclavicular, or axillary nodes Heart:regular rate and rhythm, without any murmurs or gallops.   Lung: Clear to auscultation without any rhonchi, wheezes or dullness to percussion. Abdomin: Soft, nontender without any shifting dullness or ascites. Musculoskeletal: No clubbing or cyanosis. Neurological: No motor or sensory deficits. Skin: No rashes or lesions.      Lab Results: Lab Results  Component Value Date   WBC 1.7 (L) 04/08/2018   HGB 8.6 (L) 04/08/2018   HCT 27.0 (L) 04/08/2018   MCV 88.8 04/08/2018   PLT 61 (L) 04/08/2018     Chemistry      Component Value Date/Time   NA 142 03/26/2018 1125   NA 141 04/04/2017 1030   K 4.4 03/26/2018 1125   K 4.4 04/04/2017 1030   CL 110 03/26/2018 1125   CO2 22 03/26/2018 1125    CO2 20 (L) 04/04/2017 1030   BUN 36 (H) 03/26/2018 1125   BUN 29.2 (H) 04/04/2017 1030   CREATININE 1.06 03/26/2018 1125   CREATININE 1.7 (H) 04/04/2017 1030      Component Value Date/Time   CALCIUM 8.6 (L) 03/26/2018 1125   CALCIUM 8.8 04/04/2017 1030   ALKPHOS 71 03/26/2018 1125   ALKPHOS 101 04/04/2017 1030   AST 75 (H) 03/26/2018 1125   AST 31 04/04/2017 1030   ALT 124 (H) 03/26/2018 1125   ALT 10 04/04/2017 1030   BILITOT 0.3 03/26/2018 1125   BILITOT 0.56 04/04/2017 1030        Impression and Plan:  77 year old man with:  1.  Stage IIIA mantle cell lymphoma after presenting with lymphadenopathy presented with lymphadenopathy in December 2018.   He was started on Imbruvica with initial response by CT scan criteria but developed worsening pancytopenia and failure to thrive.  Bone marrow biopsy was repeated on 03/31/2018 and discussed today with the patient.  There is no overwhelming evidence of progressive lymphoma in his bone marrow but there is an element of marrow fibrosis of unclear etiology.  The differential diagnosis and these results were reviewed today with the patient and his family.  Primary bone marrow fibrosis versus drug reaction all consideration at this time.  At this time, I have recommended continue to hold lymphoma therapy including Imbruvica as well as any other systemic chemotherapy.  We will continue to monitor his clinical status closely and restart treatment if he develops symptomatic adenopathy.  2.    Castration-sensitive prostate cancer.  He is currently on Lupron and received it in February 18, 2018.  His PSA continues to show excellent response without any need for any additional therapy.  There is no evidence of bone marrow infiltration with carcinoma at this time.  3.  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  His hemoglobin is adequate at this time and does not require transfusion.  We will continue to monitor his counts weekly and  transfuse as needed.  4.  Thrombocytopenia: His platelet counts are improving at this time which could indicate possible recovery I will continue to monitor closely at this time.  5.  Prognosis: His performance status remains adequate and improving at this time.  Aggressive therapy is warranted however he does have 2 incurable malignancies.  6.  Memory issues: Family requested evaluation and I will refer him to neurology for this issue.  7.  Follow-up: Weekly to monitor his counts and in 4 weeks for MD follow-up.  25 minutes was spent with the patient face-to-face today.  More than 50% of time was dedicated to reviewing laboratory data, bone marrow results and answering question regarding future plan of care.       Zola Button, MD 12/17/201910:34 AM

## 2018-04-08 NOTE — Addendum Note (Signed)
Addended by: Wyatt Portela on: 04/08/2018 10:46 AM   Modules accepted: Level of Service

## 2018-04-09 ENCOUNTER — Encounter (HOSPITAL_COMMUNITY): Payer: Self-pay | Admitting: Oncology

## 2018-04-09 ENCOUNTER — Inpatient Hospital Stay: Payer: Medicare HMO

## 2018-04-09 ENCOUNTER — Telehealth: Payer: Self-pay

## 2018-04-09 NOTE — Telephone Encounter (Signed)
Left a msg for patient confirming his appointment on the 24th for blood.  Also asked if he could come in on the 23rd for labs so it want delay his tranfusion. Per 12/18 book aproved appointment

## 2018-04-15 ENCOUNTER — Inpatient Hospital Stay: Payer: Medicare HMO

## 2018-04-15 ENCOUNTER — Other Ambulatory Visit: Payer: Self-pay

## 2018-04-15 DIAGNOSIS — D6489 Other specified anemias: Secondary | ICD-10-CM

## 2018-04-15 DIAGNOSIS — C61 Malignant neoplasm of prostate: Secondary | ICD-10-CM | POA: Diagnosis not present

## 2018-04-15 DIAGNOSIS — D649 Anemia, unspecified: Secondary | ICD-10-CM

## 2018-04-15 DIAGNOSIS — C8311 Mantle cell lymphoma, lymph nodes of head, face, and neck: Secondary | ICD-10-CM

## 2018-04-15 LAB — CBC WITH DIFFERENTIAL (CANCER CENTER ONLY)
Abs Immature Granulocytes: 0.08 10*3/uL — ABNORMAL HIGH (ref 0.00–0.07)
Basophils Absolute: 0 10*3/uL (ref 0.0–0.1)
Basophils Relative: 0 %
Eosinophils Absolute: 0 10*3/uL (ref 0.0–0.5)
Eosinophils Relative: 1 %
HEMATOCRIT: 23.1 % — AB (ref 39.0–52.0)
Hemoglobin: 7.4 g/dL — ABNORMAL LOW (ref 13.0–17.0)
Immature Granulocytes: 3 %
Lymphocytes Relative: 24 %
Lymphs Abs: 0.6 10*3/uL — ABNORMAL LOW (ref 0.7–4.0)
MCH: 27.8 pg (ref 26.0–34.0)
MCHC: 32 g/dL (ref 30.0–36.0)
MCV: 86.8 fL (ref 80.0–100.0)
MONOS PCT: 19 %
Monocytes Absolute: 0.5 10*3/uL (ref 0.1–1.0)
NEUTROS PCT: 53 %
NRBC: 0.8 % — AB (ref 0.0–0.2)
Neutro Abs: 1.2 10*3/uL — ABNORMAL LOW (ref 1.7–7.7)
Platelet Count: 134 10*3/uL — ABNORMAL LOW (ref 150–400)
RBC: 2.66 MIL/uL — ABNORMAL LOW (ref 4.22–5.81)
RDW: 15.9 % — ABNORMAL HIGH (ref 11.5–15.5)
WBC Count: 2.4 10*3/uL — ABNORMAL LOW (ref 4.0–10.5)

## 2018-04-15 LAB — PREPARE RBC (CROSSMATCH)

## 2018-04-15 LAB — SAMPLE TO BLOOD BANK

## 2018-04-15 MED ORDER — SODIUM CHLORIDE 0.9% IV SOLUTION
250.0000 mL | Freq: Once | INTRAVENOUS | Status: AC
  Administered 2018-04-15: 250 mL via INTRAVENOUS
  Filled 2018-04-15: qty 250

## 2018-04-15 MED ORDER — DIPHENHYDRAMINE HCL 25 MG PO CAPS
25.0000 mg | ORAL_CAPSULE | Freq: Once | ORAL | Status: AC
  Administered 2018-04-15: 25 mg via ORAL

## 2018-04-15 MED ORDER — ACETAMINOPHEN 325 MG PO TABS
650.0000 mg | ORAL_TABLET | Freq: Once | ORAL | Status: AC
  Administered 2018-04-15: 650 mg via ORAL

## 2018-04-15 MED ORDER — ACETAMINOPHEN 325 MG PO TABS
ORAL_TABLET | ORAL | Status: AC
  Filled 2018-04-15: qty 2

## 2018-04-15 MED ORDER — DIPHENHYDRAMINE HCL 25 MG PO CAPS
ORAL_CAPSULE | ORAL | Status: AC
  Filled 2018-04-15: qty 1

## 2018-04-15 NOTE — Patient Instructions (Signed)
Blood Transfusion, Adult, Care After This sheet gives you information about how to care for yourself after your procedure. Your doctor may also give you more specific instructions. If you have problems or questions, contact your doctor. Follow these instructions at home:   Take over-the-counter and prescription medicines only as told by your doctor.  Go back to your normal activities as told by your doctor.  Follow instructions from your doctor about how to take care of the area where an IV tube was put into your vein (insertion site). Make sure you: ? Wash your hands with soap and water before you change your bandage (dressing). If there is no soap and water, use hand sanitizer. ? Change your bandage as told by your doctor.  Check your IV insertion site every day for signs of infection. Check for: ? More redness, swelling, or pain. ? More fluid or blood. ? Warmth. ? Pus or a bad smell. Contact a doctor if:  You have more redness, swelling, or pain around the IV insertion site.  You have more fluid or blood coming from the IV insertion site.  Your IV insertion site feels warm to the touch.  You have pus or a bad smell coming from the IV insertion site.  Your pee (urine) turns pink, red, or brown.  You feel weak after doing your normal activities. Get help right away if:  You have signs of a serious allergic or body defense (immune) system reaction, including: ? Itchiness. ? Hives. ? Trouble breathing. ? Anxiety. ? Pain in your chest or lower back. ? Fever, flushing, and chills. ? Fast pulse. ? Rash. ? Watery poop (diarrhea). ? Throwing up (vomiting). ? Dark pee. ? Serious headache. ? Dizziness. ? Stiff neck. ? Yellow color in your face or the white parts of your eyes (jaundice). Summary  After a blood transfusion, return to your normal activities as told by your doctor.  Every day, check for signs of infection where the IV tube was put into your vein.  Some  signs of infection are warm skin, more redness and pain, more fluid or blood, and pus or a bad smell where the needle went in.  Contact your doctor if you feel weak or have any unusual symptoms. This information is not intended to replace advice given to you by your health care provider. Make sure you discuss any questions you have with your health care provider. Document Released: 04/30/2014 Document Revised: 12/02/2015 Document Reviewed: 12/02/2015 Elsevier Interactive Patient Education  2019 [employer]  

## 2018-04-16 LAB — TYPE AND SCREEN
ABO/RH(D): O POS
Antibody Screen: NEGATIVE
Unit division: 0
Unit division: 0

## 2018-04-16 LAB — BPAM RBC
Blood Product Expiration Date: 202001202359
Blood Product Expiration Date: 202001202359
ISSUE DATE / TIME: 201912240934
ISSUE DATE / TIME: 201912240934
Unit Type and Rh: 5100
Unit Type and Rh: 5100

## 2018-04-18 ENCOUNTER — Inpatient Hospital Stay: Payer: Medicare HMO

## 2018-04-18 ENCOUNTER — Inpatient Hospital Stay (HOSPITAL_BASED_OUTPATIENT_CLINIC_OR_DEPARTMENT_OTHER): Payer: Medicare HMO | Admitting: Oncology

## 2018-04-18 VITALS — BP 133/79 | HR 79 | Temp 98.4°F | Resp 18 | Ht 67.0 in | Wt 132.4 lb

## 2018-04-18 DIAGNOSIS — D649 Anemia, unspecified: Secondary | ICD-10-CM | POA: Diagnosis not present

## 2018-04-18 DIAGNOSIS — C61 Malignant neoplasm of prostate: Secondary | ICD-10-CM | POA: Diagnosis not present

## 2018-04-18 DIAGNOSIS — R413 Other amnesia: Secondary | ICD-10-CM

## 2018-04-18 DIAGNOSIS — C8311 Mantle cell lymphoma, lymph nodes of head, face, and neck: Secondary | ICD-10-CM | POA: Diagnosis not present

## 2018-04-18 NOTE — Progress Notes (Signed)
Hematology and Oncology Follow Up Visit  Joel [name] [id number] 1941-01-13 77 y.o. 04/18/2018 1:52 PM Joel [name], MDSanford, [name], MD   Principle Diagnosis: 77 year old man with:  1.  Advanced prostate cancer presented with PSA of 390 and pelvic adenopathy in 2017.  He is currently castration-sensitive disease.  2.  Mantle cell lymphoma presented with neck adenopathy and found to have stage IIIA disease.  Prior Therapy:   He is status post excisional biopsy of a right neck mass completed on 03/27/2017 with the pathology confirming mantle cell lymphoma.  He is status post palliative radiation therapy to his cervical adenopathy completed in January 2019.   Imbruvica 560 mg daily in December 2018.  Therapy has been on hold because of pancytopenia since October 2019.   Current therapy:   Lupron 30 mg once every 4 months since January 2019.  Last injection given on February 18, 2018.   Interim History: Joel [name] returns today for repeat evaluation.  Since her last visit, he did receive 2 units of packed red cells on 04/15/2018 with continuous improvement in his stamina and quality of life.  He denies any bleeding or dysphagia.  He is eating better and have gained more weight.  He denies any bulky adenopathy or early satiety.  He denies any recent hospitalizations or illnesses.    He does not report any headaches, blurry vision, syncope or seizures.  He denies any dizziness or alteration of mental status.  He does not report any fevers, chills, sweats.   He does not report any chest pain, palpitation, orthopnea or leg edema.  He does not report any cough, wheezing or hemoptysis.  He does not report any nausea, vomiting or early satiety.   He denies any changes in bowel habits.  He does not report any frequency urgency or hesitancy.   He does not report any bone pain or pathological fractures.  He does not report any skin rashes or lesions.  Remaining review of systems is  negative.  Medications: I have reviewed the patient's current medications.  Current Outpatient Medications  Medication Sig Dispense Refill  . feeding supplement, ENSURE ENLIVE, (ENSURE ENLIVE) LIQD Take 237 mLs by mouth 2 (two) times daily between meals. 237 mL 12  . megestrol (MEGACE) 400 MG/10ML suspension Take 10 mLs (400 mg total) by mouth 2 (two) times daily. (Patient not taking: Reported on 03/20/2018) 240 mL 1   No current facility-administered medications for this visit.      Allergies: No Known Allergies  Past Medical History, Surgical history, Social history, and Family History discussed today and remained unchanged.  Physical Exam:  Blood pressure 133/79, pulse 79, temperature 98.4 F (36.9 C), temperature source Oral, resp. rate 18, height _0  (1.702 m), weight 132 lb 6.4 oz (60.1 kg), SpO2 99 %.    ECOG:1     General appearance: Comfortable appearing without any discomfort Head: Normocephalic without any trauma Oropharynx: Mucous membranes are moist and pink without any thrush or ulcers. Eyes: Pupils are equal and round reactive to light. Lymph nodes: No cervical, supraclavicular, inguinal or axillary lymphadenopathy.   Heart:regular rate and rhythm.  S1 and S2 without leg edema. Lung: Clear without any rhonchi or wheezes.  No dullness to percussion. Abdomin: Soft, nontender, nondistended with good bowel sounds.  No hepatosplenomegaly. Musculoskeletal: No joint deformity or effusion.  Full range of motion noted. Neurological: No deficits noted on motor, sensory and deep tendon reflex exam. Skin: No petechial rash or dryness.  Appeared  moist.        Lab Results: Lab Results  Component Value Date   WBC 2.4 (L) 04/15/2018   HGB 7.4 (L) 04/15/2018   HCT 23.1 (L) 04/15/2018   MCV 86.8 04/15/2018   PLT 134 (L) 04/15/2018     Chemistry      Component Value Date/Time   NA 142 03/26/2018 1125   NA 141 04/04/2017 1030   K 4.4 03/26/2018 1125   K 4.4  04/04/2017 1030   CL 110 03/26/2018 1125   CO2 22 03/26/2018 1125   CO2 20 (L) 04/04/2017 1030   BUN 36 (H) 03/26/2018 1125   BUN 29.2 (H) 04/04/2017 1030   CREATININE 1.06 03/26/2018 1125   CREATININE 1.7 (H) 04/04/2017 1030      Component Value Date/Time   CALCIUM 8.6 (L) 03/26/2018 1125   CALCIUM 8.8 04/04/2017 1030   ALKPHOS 71 03/26/2018 1125   ALKPHOS 101 04/04/2017 1030   AST 75 (H) 03/26/2018 1125   AST 31 04/04/2017 1030   ALT 124 (H) 03/26/2018 1125   ALT 10 04/04/2017 1030   BILITOT 0.3 03/26/2018 1125   BILITOT 0.56 04/04/2017 1030        Impression and Plan:  77 year old man with:  1.  Mantle cell lymphoma presented with lymphadenopathy and stage IIIa disease in December 2018.    Staging work-up including CT scan and a bone marrow biopsy did not show any active disease at this time and therapy has been on hold because of potential toxicity.  He has developed pancytopenia that is resolving at this time could be related to [place].  I recommended active surveillance at this time and reinstitute therapy as needed in the future.  2.    Advanced castration-sensitive prostate cancer with lymphadenopathy.  We will continue Lupron every 4 months with the last treatment given in October 2019.  3.  Anemia: He received 2 units of packed red cells and on April 15, 2018.  His counts will be repeated next week.  4.  Thrombocytopenia: His platelet counts nearly normalized at this time.  No transfusion is needed.  5.  Prognosis: His performance status remain excellent and is improving.  Aggressive therapy remains warranted despite 2 incurable malignancies.  6.  Follow-up: Will be on April 22, 2018 for repeat lab and potential transfusion.  15 minutes was spent with the patient face-to-face today.  More than 50% of time was dedicated to discussing laboratory data, differential diagnosis and coordinating plan of care.       Joel Button, MD 12/27/20191:52  PM

## 2018-04-22 ENCOUNTER — Inpatient Hospital Stay: Payer: Medicare HMO

## 2018-04-22 ENCOUNTER — Encounter (HOSPITAL_COMMUNITY): Payer: Medicare HMO

## 2018-04-22 DIAGNOSIS — D6489 Other specified anemias: Secondary | ICD-10-CM

## 2018-04-22 DIAGNOSIS — C61 Malignant neoplasm of prostate: Secondary | ICD-10-CM | POA: Diagnosis not present

## 2018-04-22 DIAGNOSIS — C8311 Mantle cell lymphoma, lymph nodes of head, face, and neck: Secondary | ICD-10-CM

## 2018-04-22 LAB — CBC WITH DIFFERENTIAL (CANCER CENTER ONLY)
Abs Immature Granulocytes: 0.32 10*3/uL — ABNORMAL HIGH (ref 0.00–0.07)
BASOS ABS: 0 10*3/uL (ref 0.0–0.1)
Basophils Relative: 0 %
Eosinophils Absolute: 0 10*3/uL (ref 0.0–0.5)
Eosinophils Relative: 1 %
HCT: 31 % — ABNORMAL LOW (ref 39.0–52.0)
Hemoglobin: 9.8 g/dL — ABNORMAL LOW (ref 13.0–17.0)
Immature Granulocytes: 8 %
Lymphocytes Relative: 12 %
Lymphs Abs: 0.5 10*3/uL — ABNORMAL LOW (ref 0.7–4.0)
MCH: 27.9 pg (ref 26.0–34.0)
MCHC: 31.6 g/dL (ref 30.0–36.0)
MCV: 88.3 fL (ref 80.0–100.0)
Monocytes Absolute: 0.6 10*3/uL (ref 0.1–1.0)
Monocytes Relative: 16 %
NRBC: 0 % (ref 0.0–0.2)
Neutro Abs: 2.7 10*3/uL (ref 1.7–7.7)
Neutrophils Relative %: 63 %
Platelet Count: 220 10*3/uL (ref 150–400)
RBC: 3.51 MIL/uL — ABNORMAL LOW (ref 4.22–5.81)
RDW: 14.9 % (ref 11.5–15.5)
WBC Count: 4.1 10*3/uL (ref 4.0–10.5)

## 2018-04-22 LAB — SAMPLE TO BLOOD BANK

## 2018-04-25 ENCOUNTER — Telehealth: Payer: Self-pay

## 2018-04-25 NOTE — Telephone Encounter (Signed)
Per 12/27 no los 

## 2018-04-28 ENCOUNTER — Inpatient Hospital Stay: Payer: Medicare HMO | Attending: Oncology

## 2018-04-28 DIAGNOSIS — Z9221 Personal history of antineoplastic chemotherapy: Secondary | ICD-10-CM | POA: Insufficient documentation

## 2018-04-28 DIAGNOSIS — C8310 Mantle cell lymphoma, unspecified site: Secondary | ICD-10-CM | POA: Insufficient documentation

## 2018-04-28 DIAGNOSIS — D696 Thrombocytopenia, unspecified: Secondary | ICD-10-CM | POA: Diagnosis not present

## 2018-04-28 DIAGNOSIS — Z79899 Other long term (current) drug therapy: Secondary | ICD-10-CM | POA: Insufficient documentation

## 2018-04-28 DIAGNOSIS — C61 Malignant neoplasm of prostate: Secondary | ICD-10-CM | POA: Insufficient documentation

## 2018-04-28 DIAGNOSIS — D6489 Other specified anemias: Secondary | ICD-10-CM

## 2018-04-28 DIAGNOSIS — C8311 Mantle cell lymphoma, lymph nodes of head, face, and neck: Secondary | ICD-10-CM

## 2018-04-28 DIAGNOSIS — Z923 Personal history of irradiation: Secondary | ICD-10-CM | POA: Diagnosis not present

## 2018-04-28 LAB — CBC WITH DIFFERENTIAL (CANCER CENTER ONLY)
Abs Immature Granulocytes: 0.42 10*3/uL — ABNORMAL HIGH (ref 0.00–0.07)
Basophils Absolute: 0 10*3/uL (ref 0.0–0.1)
Basophils Relative: 1 %
EOS PCT: 1 %
Eosinophils Absolute: 0 10*3/uL (ref 0.0–0.5)
HCT: 29.1 % — ABNORMAL LOW (ref 39.0–52.0)
Hemoglobin: 9.2 g/dL — ABNORMAL LOW (ref 13.0–17.0)
Immature Granulocytes: 10 %
Lymphocytes Relative: 13 %
Lymphs Abs: 0.5 10*3/uL — ABNORMAL LOW (ref 0.7–4.0)
MCH: 28 pg (ref 26.0–34.0)
MCHC: 31.6 g/dL (ref 30.0–36.0)
MCV: 88.4 fL (ref 80.0–100.0)
MONO ABS: 0.5 10*3/uL (ref 0.1–1.0)
MONOS PCT: 13 %
Neutro Abs: 2.7 10*3/uL (ref 1.7–7.7)
Neutrophils Relative %: 62 %
Platelet Count: 176 10*3/uL (ref 150–400)
RBC: 3.29 MIL/uL — ABNORMAL LOW (ref 4.22–5.81)
RDW: 14.9 % (ref 11.5–15.5)
WBC Count: 4.3 10*3/uL (ref 4.0–10.5)
nRBC: 0.5 % — ABNORMAL HIGH (ref 0.0–0.2)

## 2018-04-28 LAB — SAMPLE TO BLOOD BANK

## 2018-04-29 ENCOUNTER — Encounter (HOSPITAL_COMMUNITY): Payer: Medicare HMO

## 2018-05-05 ENCOUNTER — Inpatient Hospital Stay (HOSPITAL_BASED_OUTPATIENT_CLINIC_OR_DEPARTMENT_OTHER): Payer: Medicare HMO | Admitting: Oncology

## 2018-05-05 ENCOUNTER — Inpatient Hospital Stay: Payer: Medicare HMO

## 2018-05-05 ENCOUNTER — Encounter: Payer: Self-pay | Admitting: Oncology

## 2018-05-05 ENCOUNTER — Telehealth: Payer: Self-pay

## 2018-05-05 ENCOUNTER — Ambulatory Visit: Payer: Medicare HMO | Admitting: Oncology

## 2018-05-05 ENCOUNTER — Other Ambulatory Visit: Payer: Medicare HMO

## 2018-05-05 VITALS — BP 117/83 | HR 117 | Temp 100.0°F | Resp 17 | Wt 125.2 lb

## 2018-05-05 DIAGNOSIS — D696 Thrombocytopenia, unspecified: Secondary | ICD-10-CM

## 2018-05-05 DIAGNOSIS — Z923 Personal history of irradiation: Secondary | ICD-10-CM | POA: Diagnosis not present

## 2018-05-05 DIAGNOSIS — C8310 Mantle cell lymphoma, unspecified site: Secondary | ICD-10-CM | POA: Diagnosis not present

## 2018-05-05 DIAGNOSIS — C61 Malignant neoplasm of prostate: Secondary | ICD-10-CM

## 2018-05-05 DIAGNOSIS — Z9221 Personal history of antineoplastic chemotherapy: Secondary | ICD-10-CM

## 2018-05-05 DIAGNOSIS — D6489 Other specified anemias: Secondary | ICD-10-CM

## 2018-05-05 DIAGNOSIS — Z79899 Other long term (current) drug therapy: Secondary | ICD-10-CM

## 2018-05-05 DIAGNOSIS — C8311 Mantle cell lymphoma, lymph nodes of head, face, and neck: Secondary | ICD-10-CM

## 2018-05-05 LAB — SAMPLE TO BLOOD BANK

## 2018-05-05 LAB — CBC WITH DIFFERENTIAL (CANCER CENTER ONLY)
Abs Immature Granulocytes: 0.38 10*3/uL — ABNORMAL HIGH (ref 0.00–0.07)
Basophils Absolute: 0 10*3/uL (ref 0.0–0.1)
Basophils Relative: 1 %
Eosinophils Absolute: 0 10*3/uL (ref 0.0–0.5)
Eosinophils Relative: 1 %
HCT: 26.8 % — ABNORMAL LOW (ref 39.0–52.0)
Hemoglobin: 8.6 g/dL — ABNORMAL LOW (ref 13.0–17.0)
Immature Granulocytes: 9 %
Lymphocytes Relative: 14 %
Lymphs Abs: 0.6 10*3/uL — ABNORMAL LOW (ref 0.7–4.0)
MCH: 27.9 pg (ref 26.0–34.0)
MCHC: 32.1 g/dL (ref 30.0–36.0)
MCV: 87 fL (ref 80.0–100.0)
Monocytes Absolute: 0.6 10*3/uL (ref 0.1–1.0)
Monocytes Relative: 13 %
Neutro Abs: 2.8 10*3/uL (ref 1.7–7.7)
Neutrophils Relative %: 62 %
Platelet Count: 104 10*3/uL — ABNORMAL LOW (ref 150–400)
RBC: 3.08 MIL/uL — ABNORMAL LOW (ref 4.22–5.81)
RDW: 14.1 % (ref 11.5–15.5)
WBC Count: 4.4 10*3/uL (ref 4.0–10.5)
nRBC: 1.4 % — ABNORMAL HIGH (ref 0.0–0.2)

## 2018-05-05 NOTE — Telephone Encounter (Signed)
Printed avs and calender of upcoming appointment. Per 1/13 los 

## 2018-05-05 NOTE — Progress Notes (Signed)
No need for scheduled blood transfusion for Hgb 8.6 per Dr. Alen Blew and Maudie Mercury RN made aware.

## 2018-05-05 NOTE — Progress Notes (Signed)
Hematology and Oncology Follow Up Visit  Joel [name] [id number] 1941-02-26 78 y.o. 05/05/2018 10:30 AM [name], MDSanford, [name], MD   Principle Diagnosis: 78 year old man with:  1.  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    2.  Stage IIIA Mantle cell lymphoma diagnosed in 2018.  He presented with cervical and abdominal adenopathy.  Prior Therapy:   He is status post excisional biopsy of a right neck mass completed on 03/27/2017 with the pathology confirming mantle cell lymphoma.  He is status post palliative radiation therapy to his cervical adenopathy completed in January 2019.   Imbruvica 560 mg daily in December 2018.  Therapy has been on hold because of pancytopenia since October 2019.   Current therapy:   Lupron 30 mg once every 4 months since January 2019.  Last injection given on February 18, 2018.   Interim History: Joel [name] presents today for a repeat evaluation.  Since the last visit, he reports no major changes in his health.  He has been feeling reasonably well and for the most part recovered from her recent illness until April 28, 2018.  At the time his hemoglobin and all his counts nearly normalized.  In the last few days, he reports slight decline in his overall performance status.  It is unclear of any clear-cut etiology.  He reports some slight cough and low-grade temperature but no chills or sweats.  His appetite has declined again and lost few pounds.  He denies any diarrhea or palpable adenopathy.  He does report some mild weakness but no falls.  He denies any bone pain or arthralgias.    He does not report any headaches, blurry vision, syncope or seizures.  He denies any confusion or lethargy.  He does not report any fevers, chills, sweats.   He does not report any chest pain, palpitation, orthopnea or leg edema.  He does not report any cough, wheezing or hemoptysis.  He does not report any nausea,  vomiting or distention.   He denies any constipation or diarrhea.  He does not report any frequency urgency or hesitancy.   He does not report any myalgias.  He does not report any skin rashes or lesions.  He denies any bleeding or clotting tendency.  He denies any changes in his mood.  Remaining review of systems is negative.  Medications: I have reviewed the patient's current medications.  Current Outpatient Medications  Medication Sig Dispense Refill  . feeding supplement, ENSURE ENLIVE, (ENSURE ENLIVE) LIQD Take 237 mLs by mouth 2 (two) times daily between meals. 237 mL 12  . megestrol (MEGACE) 400 MG/10ML suspension Take 10 mLs (400 mg total) by mouth 2 (two) times daily. (Patient not taking: Reported on 03/20/2018) 240 mL 1   No current facility-administered medications for this visit.      Allergies: No Known Allergies  Past Medical History, Surgical history, Social history, and Family History updated without any changes.  Physical Exam:  Blood pressure 117/83, pulse (!) 117, temperature 100 F (37.8 C), temperature source Oral, resp. rate 17, weight 125 lb 3 oz (56.8 kg), SpO2 100 %.     ECOG:1     General appearance: Alert, awake without any distress. Head: Atraumatic without abnormalities Oropharynx: Without any thrush or ulcers. Eyes: No scleral icterus. Lymph nodes: No lymphadenopathy noted in the cervical, supraclavicular, or axillary nodes Heart:regular rate and rhythm, without any murmurs or gallops.   Lung: Clear to auscultation without  any rhonchi, wheezes or dullness to percussion. Abdomin: Soft, nontender without any shifting dullness or ascites. Musculoskeletal: No clubbing or cyanosis. Neurological: No motor or sensory deficits. Skin: No rashes or lesions. Psychiatric: Mood and affect appeared normal.       Lab Results: Lab Results  Component Value Date   WBC 4.3 04/28/2018   HGB 9.2 (L) 04/28/2018   HCT 29.1 (L) 04/28/2018   MCV 88.4  04/28/2018   PLT 176 04/28/2018     Chemistry      Component Value Date/Time   NA 142 03/26/2018 1125   NA 141 04/04/2017 1030   K 4.4 03/26/2018 1125   K 4.4 04/04/2017 1030   CL 110 03/26/2018 1125   CO2 22 03/26/2018 1125   CO2 20 (L) 04/04/2017 1030   BUN 36 (H) 03/26/2018 1125   BUN 29.2 (H) 04/04/2017 1030   CREATININE 1.06 03/26/2018 1125   CREATININE 1.7 (H) 04/04/2017 1030      Component Value Date/Time   CALCIUM 8.6 (L) 03/26/2018 1125   CALCIUM 8.8 04/04/2017 1030   ALKPHOS 71 03/26/2018 1125   ALKPHOS 101 04/04/2017 1030   AST 75 (H) 03/26/2018 1125   AST 31 04/04/2017 1030   ALT 124 (H) 03/26/2018 1125   ALT 10 04/04/2017 1030   BILITOT 0.3 03/26/2018 1125   BILITOT 0.56 04/04/2017 1030        Impression and Plan:  78 year old man with:  1.  Stage IIIA Mantle cell lymphoma diagnosed in December 2018 after presenting with cervical adenopathy in the setting of prostate cancer.  He is currently off therapy at this time after completing a course of ibrutinib with complete response.  Therapy discontinued because of pancytopenia.  Risks and benefits of continuing this approach was discussed today.  For the time being we will continue to hold therapy given his overall stable disease.  The plan is to repeat imaging studies in the next 3 months.  2.    Castration-sensitive prostate cancer with lymphadenopathy.  He continues to receive Lupron every 4 months.  Next injection will be scheduled in February 2020.  3.  Anemia: Last transfusion was given on April 15, 2018 with hemoglobin continues to be stable as of April 28, 2018.  His anemia might be related to marrow marrow toxicity from ibrutinib without any evidence of lymphoma in his bone marrow biopsy.  No transfusion is needed at this time with a hemoglobin of 8.6.  4.  Thrombocytopenia: Platelet count fully recovered at this time.  Platelet count today remains over 100 without any active bleeding.  5.   Prognosis: Therapy remains palliative in all fronts.  He has 2 incurable malignancies at this time.  6.  Weakness and failure to thrive: Unclear etiology at this time.  Could be related to his malignancy and cancer treatment.  I recommended supportive management at this time continued observation.  I recommended continuing to use Megace to boost his appetite and nutritional supplements to boost his nutritional intake.  7.  Follow-up: Will be in every 2 to monitor his counts.  25 minutes was spent with the patient face-to-face today.  More than 50% of time was dedicated to reviewing his disease status, and options and coordinating plan of care.      Zola Button, MD 1/13/202010:30 AM

## 2018-05-16 ENCOUNTER — Other Ambulatory Visit: Payer: Self-pay

## 2018-05-16 ENCOUNTER — Encounter (HOSPITAL_COMMUNITY): Payer: Self-pay | Admitting: *Deleted

## 2018-05-16 ENCOUNTER — Inpatient Hospital Stay: Payer: Medicare HMO

## 2018-05-16 ENCOUNTER — Emergency Department (HOSPITAL_COMMUNITY): Payer: Medicare HMO

## 2018-05-16 ENCOUNTER — Inpatient Hospital Stay (HOSPITAL_COMMUNITY)
Admission: EM | Admit: 2018-05-16 | Discharge: 2018-05-27 | DRG: 377 | Disposition: A | Discharge status: Home Health | Payer: Medicare HMO | Attending: Internal Medicine | Admitting: Internal Medicine

## 2018-05-16 DIAGNOSIS — K31811 Angiodysplasia of stomach and duodenum with bleeding: Principal | ICD-10-CM | POA: Diagnosis present

## 2018-05-16 DIAGNOSIS — R509 Fever, unspecified: Secondary | ICD-10-CM | POA: Diagnosis not present

## 2018-05-16 DIAGNOSIS — R680 Hypothermia, not associated with low environmental temperature: Secondary | ICD-10-CM | POA: Diagnosis present

## 2018-05-16 DIAGNOSIS — E43 Unspecified severe protein-calorie malnutrition: Secondary | ICD-10-CM

## 2018-05-16 DIAGNOSIS — Z8249 Family history of ischemic heart disease and other diseases of the circulatory system: Secondary | ICD-10-CM

## 2018-05-16 DIAGNOSIS — C8311 Mantle cell lymphoma, lymph nodes of head, face, and neck: Secondary | ICD-10-CM | POA: Diagnosis present

## 2018-05-16 DIAGNOSIS — D696 Thrombocytopenia, unspecified: Secondary | ICD-10-CM

## 2018-05-16 DIAGNOSIS — D61818 Other pancytopenia: Secondary | ICD-10-CM | POA: Diagnosis not present

## 2018-05-16 DIAGNOSIS — Z801 Family history of malignant neoplasm of trachea, bronchus and lung: Secondary | ICD-10-CM

## 2018-05-16 DIAGNOSIS — Z923 Personal history of irradiation: Secondary | ICD-10-CM

## 2018-05-16 DIAGNOSIS — Z8701 Personal history of pneumonia (recurrent): Secondary | ICD-10-CM

## 2018-05-16 DIAGNOSIS — N390 Urinary tract infection, site not specified: Secondary | ICD-10-CM | POA: Diagnosis not present

## 2018-05-16 DIAGNOSIS — D62 Acute posthemorrhagic anemia: Secondary | ICD-10-CM | POA: Diagnosis present

## 2018-05-16 DIAGNOSIS — D6489 Other specified anemias: Secondary | ICD-10-CM

## 2018-05-16 DIAGNOSIS — C61 Malignant neoplasm of prostate: Secondary | ICD-10-CM

## 2018-05-16 DIAGNOSIS — B961 Klebsiella pneumoniae [K. pneumoniae] as the cause of diseases classified elsewhere: Secondary | ICD-10-CM | POA: Diagnosis not present

## 2018-05-16 DIAGNOSIS — Z87891 Personal history of nicotine dependence: Secondary | ICD-10-CM

## 2018-05-16 DIAGNOSIS — Z9079 Acquired absence of other genital organ(s): Secondary | ICD-10-CM

## 2018-05-16 DIAGNOSIS — K2941 Chronic atrophic gastritis with bleeding: Secondary | ICD-10-CM | POA: Diagnosis present

## 2018-05-16 DIAGNOSIS — K921 Melena: Secondary | ICD-10-CM

## 2018-05-16 DIAGNOSIS — D649 Anemia, unspecified: Secondary | ICD-10-CM | POA: Diagnosis not present

## 2018-05-16 DIAGNOSIS — T68XXXA Hypothermia, initial encounter: Secondary | ICD-10-CM

## 2018-05-16 DIAGNOSIS — I509 Heart failure, unspecified: Secondary | ICD-10-CM

## 2018-05-16 DIAGNOSIS — K2981 Duodenitis with bleeding: Secondary | ICD-10-CM | POA: Diagnosis present

## 2018-05-16 DIAGNOSIS — B9689 Other specified bacterial agents as the cause of diseases classified elsewhere: Secondary | ICD-10-CM | POA: Diagnosis present

## 2018-05-16 DIAGNOSIS — I10 Essential (primary) hypertension: Secondary | ICD-10-CM | POA: Diagnosis present

## 2018-05-16 DIAGNOSIS — R64 Cachexia: Secondary | ICD-10-CM | POA: Diagnosis present

## 2018-05-16 DIAGNOSIS — E876 Hypokalemia: Secondary | ICD-10-CM | POA: Diagnosis not present

## 2018-05-16 DIAGNOSIS — K2990 Gastroduodenitis, unspecified, without bleeding: Secondary | ICD-10-CM

## 2018-05-16 DIAGNOSIS — K31819 Angiodysplasia of stomach and duodenum without bleeding: Secondary | ICD-10-CM

## 2018-05-16 DIAGNOSIS — J150 Pneumonia due to Klebsiella pneumoniae: Secondary | ICD-10-CM | POA: Diagnosis present

## 2018-05-16 DIAGNOSIS — Z681 Body mass index (BMI) 19 or less, adult: Secondary | ICD-10-CM

## 2018-05-16 DIAGNOSIS — K2970 Gastritis, unspecified, without bleeding: Secondary | ICD-10-CM

## 2018-05-16 LAB — CBC
HCT: 10.3 % — ABNORMAL LOW (ref 39.0–52.0)
Hemoglobin: 3.1 g/dL — CL (ref 13.0–17.0)
MCH: 27.2 pg (ref 26.0–34.0)
MCHC: 30.1 g/dL (ref 30.0–36.0)
MCV: 90.4 fL (ref 80.0–100.0)
Platelets: 56 10*3/uL — ABNORMAL LOW (ref 150–400)
RBC: 1.14 MIL/uL — AB (ref 4.22–5.81)
RDW: 14.8 % (ref 11.5–15.5)
WBC: 4.2 10*3/uL (ref 4.0–10.5)
nRBC: 6.7 % — ABNORMAL HIGH (ref 0.0–0.2)

## 2018-05-16 LAB — CBC WITH DIFFERENTIAL (CANCER CENTER ONLY)
Abs Immature Granulocytes: 0.26 10*3/uL — ABNORMAL HIGH (ref 0.00–0.07)
Basophils Absolute: 0 10*3/uL (ref 0.0–0.1)
Basophils Relative: 0 %
EOS ABS: 0 10*3/uL (ref 0.0–0.5)
Eosinophils Relative: 1 %
HEMATOCRIT: 9.2 % — AB (ref 39.0–52.0)
Hemoglobin: 2.8 g/dL — CL (ref 13.0–17.0)
Immature Granulocytes: 10 %
Lymphocytes Relative: 30 %
Lymphs Abs: 0.8 10*3/uL (ref 0.7–4.0)
MCH: 27.7 pg (ref 26.0–34.0)
MCHC: 30.4 g/dL (ref 30.0–36.0)
MCV: 91.1 fL (ref 80.0–100.0)
MONO ABS: 0.4 10*3/uL (ref 0.1–1.0)
Monocytes Relative: 13 %
Neutro Abs: 1.2 10*3/uL — ABNORMAL LOW (ref 1.7–7.7)
Neutrophils Relative %: 46 %
Platelet Count: 52 10*3/uL — ABNORMAL LOW (ref 150–400)
RBC: 1.01 MIL/uL — ABNORMAL LOW (ref 4.22–5.81)
RDW: 15 % (ref 11.5–15.5)
WBC Count: 2.7 10*3/uL — ABNORMAL LOW (ref 4.0–10.5)
nRBC: 6.3 % — ABNORMAL HIGH (ref 0.0–0.2)

## 2018-05-16 LAB — PREPARE RBC (CROSSMATCH)

## 2018-05-16 LAB — CMP (CANCER CENTER ONLY)
ALT: 15 U/L (ref 0–44)
ANION GAP: 19 — AB (ref 5–15)
AST: 17 U/L (ref 15–41)
Albumin: 2.1 g/dL — ABNORMAL LOW (ref 3.5–5.0)
Alkaline Phosphatase: 60 U/L (ref 38–126)
BUN: 60 mg/dL — ABNORMAL HIGH (ref 8–23)
CO2: 18 mmol/L — ABNORMAL LOW (ref 22–32)
Calcium: 8.7 mg/dL — ABNORMAL LOW (ref 8.9–10.3)
Chloride: 107 mmol/L (ref 98–111)
Creatinine: 1.32 mg/dL — ABNORMAL HIGH (ref 0.61–1.24)
GFR, Est AFR Am: >60 mL/min (ref >60)
GFR, Estimated: 52 mL/min — ABNORMAL LOW (ref >60)
Glucose, Bld: 291 mg/dL — ABNORMAL HIGH (ref 70–99)
Potassium: 4.2 mmol/L (ref 3.5–5.1)
Sodium: 144 mmol/L (ref 135–145)
TOTAL PROTEIN: 5.4 g/dL — AB (ref 6.5–8.1)
Total Bilirubin: 0.4 mg/dL (ref 0.3–1.2)

## 2018-05-16 LAB — SAMPLE TO BLOOD BANK

## 2018-05-16 LAB — GLUCOSE, CAPILLARY: Glucose-Capillary: 96 mg/dL (ref 70–99)

## 2018-05-16 LAB — MRSA PCR SCREENING: MRSA by PCR: NEGATIVE

## 2018-05-16 MED ORDER — PANTOPRAZOLE SODIUM 40 MG IV SOLR
40.0000 mg | Freq: Once | INTRAVENOUS | Status: AC
  Administered 2018-05-16: 40 mg via INTRAVENOUS
  Filled 2018-05-16: qty 40

## 2018-05-16 MED ORDER — ONDANSETRON HCL 4 MG PO TABS: 4.0000 mg | ORAL_TABLET | Freq: Four times a day (QID) | ORAL | Status: DC | PRN

## 2018-05-16 MED ORDER — ACETAMINOPHEN 325 MG PO TABS
650.0000 mg | ORAL_TABLET | Freq: Four times a day (QID) | ORAL | Status: DC | PRN
  Administered 2018-05-22 – 2018-05-26 (×6): 650 mg via ORAL
  Filled 2018-05-16 (×6): qty 2

## 2018-05-16 MED ORDER — ACETAMINOPHEN 650 MG RE SUPP: 650.0000 mg | Freq: Four times a day (QID) | RECTAL | Status: DC | PRN

## 2018-05-16 MED ORDER — SODIUM CHLORIDE 0.9 % IV SOLN: 10.0000 mL/h | Freq: Once | INTRAVENOUS | Status: DC

## 2018-05-16 MED ORDER — SODIUM CHLORIDE 0.9% IV SOLUTION: Freq: Once | INTRAVENOUS | Status: DC

## 2018-05-16 MED ORDER — ORAL CARE MOUTH RINSE
15.0000 mL | Freq: Two times a day (BID) | OROMUCOSAL | Status: DC
  Administered 2018-05-16 – 2018-05-27 (×15): 15 mL via OROMUCOSAL

## 2018-05-16 MED ORDER — ONDANSETRON HCL 4 MG/2ML IJ SOLN
4.0000 mg | Freq: Four times a day (QID) | INTRAMUSCULAR | Status: DC | PRN
  Filled 2018-05-16: qty 2

## 2018-05-16 NOTE — ED Notes (Signed)
ED TO INPATIENT HANDOFF REPORT  Name/Age/Gender Joel [name] 78 y.o. male  Code Status Code Status History    Date Active Date Inactive Code Status Order ID Comments User Context   03/20/2018 0200 03/21/2018 1951 Full Code [id number]  Ivor Costa, MD ED   07/28/2015 2119 08/01/2015 1831 Full Code [id number]  Ivor Costa, MD ED   07/08/2015 0053 07/13/2015 2315 Full Code [id number]  Ivor Costa, MD ED    Advance Directive Documentation     Most Recent Value  Type of Advance Directive  Healthcare Power of Attorney, Living will  Pre-existing out of facility DNR order (yellow form or pink MOST form)  -  "MOST" Form in Place?  -      Home/SNF/Other Home  Chief Complaint low HbB  Level of Care/Admitting Diagnosis ED Disposition    ED Disposition Condition [place]: [place] [[id number]]  Level of Care: Stepdown [14]  Admit to SDU based on following criteria: Severe physiological/psychological symptoms:  Any diagnosis requiring assessment & intervention at least every 4 hours on an ongoing basis to obtain desired patient outcomes including stability and rehabilitation  Diagnosis: Gastrointestinal hemorrhage with melena [[id number]]  Admitting Physician: Reubin Milan [[id number]]  Attending Physician: Reubin Milan [[id number]]  PT Class (Do Not Modify): Observation [104]  PT Acc Code (Do Not Modify): Observation [10022]       Medical History Past Medical History:  Diagnosis Date  . Anemia   . Aneurysm ([place])    in 1987, brain aneurysm  . Bilateral hydronephrosis   . History of radiation therapy 04/17/17- 05/07/17   Right neck treated to 32.5 Gy with 13 fx of 2.5 Gy  . mantle cell lymphoma dx'd 03/2017  . Mass of right side of neck   . Pneumonia    hx of   . Prostate CA ([place]) dx'd 2017  . Urinary retention     Allergies No Known Allergies  IV Location/Drains/Wounds Patient Lines/Drains/Airways Status   Active Line/Drains/Airways     Name:   Placement date:   Placement time:   Site:   Days:   Peripheral IV 05/16/18 Left Antecubital   05/16/18    1331    Antecubital   less than 1   External Urinary Catheter   03/20/18    0542    -   57   Incision (Closed) 07/30/15 Perineum Other (Comment)   07/30/15    0842     1021   Incision (Closed) 03/27/17 Neck Right   03/27/17    0933     415          Labs/Imaging Results for orders placed or performed during the hospital encounter of 05/16/18 (from the past 48 hour(s))  Type and screen [place]     Status: None (Preliminary result)   Collection Time: 05/16/18 11:31 AM  Result Value Ref Range   ABO/RH(D) O POS    Antibody Screen NEG    Sample Expiration 05/19/2018    Unit Number [id number]    Blood Component Type RED CELLS,LR    Unit division 00    Status of Unit ISSUED    Transfusion Status OK TO TRANSFUSE    Crossmatch Result      Compatible Performed at [facility], [place] [address], [place], [place] [address]    Unit Number [id number]    Blood Component Type RED CELLS,LR    Unit division 00  Status of Unit ALLOCATED    Transfusion Status OK TO TRANSFUSE    Crossmatch Result Compatible    Unit Number [id number]    Blood Component Type RED CELLS,LR    Unit division 00    Status of Unit ALLOCATED    Transfusion Status OK TO TRANSFUSE    Crossmatch Result Compatible    Unit Number [id number]    Blood Component Type RED CELLS,LR    Unit division 00    Status of Unit ALLOCATED    Transfusion Status OK TO TRANSFUSE    Crossmatch Result Compatible   CBC     Status: Abnormal   Collection Time: 05/16/18  1:10 PM  Result Value Ref Range   WBC 4.2 4.0 - 10.5 K/uL   RBC 1.14 (L) 4.22 - 5.81 MIL/uL   Hemoglobin 3.1 (LL) 13.0 - 17.0 g/dL    Comment: This critical result has verified and been called to [facility]. RN by Sarita Bottom on 01 24 2020 at 1339, and has been read back. CRITICAL RESULT VERIFIED   HCT  10.3 (L) 39.0 - 52.0 %   MCV 90.4 80.0 - 100.0 fL   MCH 27.2 26.0 - 34.0 pg   MCHC 30.1 30.0 - 36.0 g/dL   RDW 14.8 11.5 - 15.5 %   Platelets 56 (L) 150 - 400 K/uL    Comment: Immature Platelet Fraction may be clinically indicated, consider ordering this additional test [id number]    nRBC 6.7 (H) 0.0 - 0.2 %    Comment: Performed at [facility], [place] [address], [place], [place] [address]  Prepare RBC     Status: None   Collection Time: 05/16/18  1:22 PM  Result Value Ref Range   Order Confirmation      ORDER PROCESSED BY BLOOD BANK Performed at [facility] [address] [place], [place] [address]    Dg Chest Port 1 View  Result Date: 05/16/2018 CLINICAL DATA:  Low hemoglobin. Lymphoma. Nausea vomiting. Frequent transfusions. EXAM: PORTABLE CHEST 1 VIEW COMPARISON:  Chest x-ray 03/20/2018, 07/07/2015.  PET-CT 04/25/2017. FINDINGS: Mediastinum hilar structures normal. Left perihilar atelectasis. No focal alveolar infiltrate. Stable mild bilateral pleural thickening consistent scarring. No pneumothorax. Cardiomegaly with normal pulmonary vascularity. Degenerative change thoracic spine. IMPRESSION: 1.  Left perihilar atelectasis. 2.  Cardiomegaly.  No pulmonary venous congestion. Electronically Signed   By: Marcello Moores  Register   On: 05/16/2018 14:41   None  Pending Labs Unresulted Labs (From admission, onward)    Start     Ordered   05/16/18 1452  Occult blood card to lab, stool RN will collect (When patient has a BM)  Once,   STAT    Question:  Specimen to be collected by?  Answer:  RN will collect  Comment:  When patient has a BM   05/16/18 1451          Vitals/Pain Today's Vitals   05/16/18 1349 05/16/18 1410 05/16/18 1500 05/16/18 1600  BP: 97/60 (!) 109/57 117/69 121/77  Pulse: 92 93    Resp: 19 18    Temp: 97.6 F (36.4 C) (!) 97.5 F (36.4 C)    TempSrc: Oral Oral    SpO2: 100% 100%      Isolation Precautions No active  isolations  Medications Medications  0.9 %  sodium chloride infusion (has no administration in time range)  pantoprazole (PROTONIX) injection 40 mg (has no administration in time range)    Mobility walks with device

## 2018-05-16 NOTE — ED Notes (Signed)
Coming from cancer center, states history of prostate cancer-states Hgb of 2.8

## 2018-05-16 NOTE — H&P (Signed)
History and Physical    Elzia [name] UUV:[id number] DOB: 1940-07-24 DOA: 05/16/2018  PCP: [name], MD   Patient coming from: Cancer center.  I have personally briefly reviewed patient's old medical records in [place]  Chief Complaint: Severe anemia.  HPI: Joel [name] is a 78 y.o. male with medical history significant of anemia, pancytopenia, history of bilateral hydronephrosis, history of brain aneurysm in 1987, history of mantle cell lymphoma, history of mass of the right side of the neck, history of pneumonia, history of urinary retention who is sent from the cancer center for further evaluation and treatment after his hemoglobin level was found to be 2.9 g/dL.  The patient states that he has been feeling weak and fatigue lately.  He has been sleeping a lot more.  He had an episode of melena recently.  He denies abdominal pain, nausea, emesis, constipation, diarrhea or hematochezia.  No dysuria, frequency or hematuria.  He denies fever, headache, chills, sore throat, productive cough, wheezing, hemoptysis, chest pain, palpitations, dizziness, diaphoresis, PND, or orthopnea.  He occasionally gets lower extremity pitting edema.  He denies polyuria, polydipsia, polyphagia or blurred vision.  No dysuria, frequency or hematuria.    ED Course: Initial vital signs are pulse 83, respirations 19, blood pressure 120/79 mmHg and O2 sat 100% on room air.  He was started on PRBC transfusion.  GI was consulted.  His white count was 4.2, hemoglobin 3.1 g/dL (8.6 g/dL on 05/05/2018) and platelets 56.  Sodium 144, potassium 4.2, chloride 107 and CO2 18 mmol/L.  BUN was 60, creatinine 1.32, calcium 8.7 and glucose was 291 mg/dL.  Hepatic functions show an albumin of 2.1 and a total protein of 5.4 g/dL, transaminases and bilirubin are within normal limits. His chest radiograph shows left perihilar atelectasis, cardiomegaly but no pulmonary venous congestion.  Review of Systems: As per HPI  otherwise 10 point review of systems negative.   Past Medical History:  Diagnosis Date  . Anemia   . Aneurysm ([place])    in 1987, brain aneurysm  . Bilateral hydronephrosis   . History of radiation therapy 04/17/17- 05/07/17   Right neck treated to 32.5 Gy with 13 fx of 2.5 Gy  . mantle cell lymphoma dx'd 03/2017  . Mass of right side of neck   . Pneumonia    hx of   . Prostate CA ([place]) dx'd 2017  . Urinary retention     Past Surgical History:  Procedure Laterality Date  . MASS BIOPSY Right 03/27/2017   Procedure: OPEN NECK MASS BIOPSY OF THE RIGHT NECK;  Surgeon: Leta Baptist, MD;  Location: [facility];  Service: ENT;  Laterality: Right;  . PROSTATE BIOPSY N/A 07/30/2015   Procedure: PROSTATE BIOPSY;  Surgeon: Irine Seal, MD;  Location: [facility];  Service: Urology;  Laterality: N/A;  . TRANSURETHRAL RESECTION OF PROSTATE N/A 07/30/2015   Procedure: TRANSURETHRAL RESECTION OF THE PROSTATE (TURP);  Surgeon: Irine Seal, MD;  Location: [facility];  Service: Urology;  Laterality: N/A;     reports that he quit smoking about 33 years ago. He has quit using smokeless tobacco.  His smokeless tobacco use included chew. He reports that he does not drink alcohol or use drugs.  No Known Allergies  Family History  Problem Relation Age of Onset  . Dementia Mother   . Heart disease Father   . Lung cancer Brother   . Lupus Sister    Prior to Admission medications   Medication Sig Start Date  End Date Taking? Authorizing Provider  feeding supplement, ENSURE ENLIVE, (ENSURE ENLIVE) LIQD Take 237 mLs by mouth 2 (two) times daily between meals. 03/22/18  Yes Elodia Florence., MD  megestrol (MEGACE) 400 MG/10ML suspension Take 10 mLs (400 mg total) by mouth 2 (two) times daily. Patient not taking: Reported on 05/05/2018 03/10/18   Wyatt Portela, MD    Physical Exam: Vitals:   05/16/18 1500 05/16/18 1600 05/16/18 1630 05/16/18 1700  BP: 117/69 121/77 129/77 120/79  Pulse:    83  Resp:    19  Temp:        TempSrc:      SpO2:    100%    Constitutional: Looks chronically ill and cachectic, but otherwise in NAD, calm, comfortable Eyes: PERRL, lids and conjunctivae very pale. ENMT: Mucous membranes are moist. Posterior pharynx clear of any exudate or lesions. Neck: normal, supple, no masses, no thyromegaly Respiratory: Clear to auscultation bilaterally, no wheezing, no crackles. Normal respiratory effort. No accessory muscle use.  Cardiovascular: Regular rate and rhythm, no murmurs / rubs / gallops. No extremity edema. 2+ pedal pulses. No carotid bruits.  Abdomen: Soft, no tenderness, no masses palpated. No hepatosplenomegaly. Bowel sounds positive.  Musculoskeletal: no clubbing / cyanosis. Good ROM, no contractures. Normal muscle tone.  Skin: Skin is pale.  Otherwise no significant rashes, lesions, ulcers. No induration on limited dermatological examination. Neurologic: CN 2-12 grossly intact. Sensation intact, DTR normal. Strength 5/5 in all 4.  Psychiatric: Normal judgment and insight. Alert and oriented x 3. Normal mood.   Labs on Admission: I have personally reviewed following labs and imaging studies  CBC: Recent Labs  Lab 05/16/18 1131 05/16/18 1310  WBC 2.7* 4.2  NEUTROABS 1.2*  --   HGB 2.8* 3.1*  HCT 9.2* 10.3*  MCV 91.1 90.4  PLT 52* 56*   Basic Metabolic Panel: Recent Labs  Lab 05/16/18 1131  NA 144  K 4.2  CL 107  CO2 18*  GLUCOSE 291*  BUN 60*  CREATININE 1.32*  CALCIUM 8.7*   GFR: Estimated Creatinine Clearance: 38.8 mL/min (A) (by C-G formula based on SCr of 1.32 mg/dL (H)). Liver Function Tests: Recent Labs  Lab 05/16/18 1131  AST 17  ALT 15  ALKPHOS 60  BILITOT 0.4  PROT 5.4*  ALBUMIN 2.1*   No results for input(s): LIPASE, AMYLASE in the last 168 hours. No results for input(s): AMMONIA in the last 168 hours. Coagulation Profile: No results for input(s): INR, PROTIME in the last 168 hours. Cardiac Enzymes: No results for input(s): CKTOTAL,  CKMB, CKMBINDEX, TROPONINI in the last 168 hours. BNP (last 3 results) No results for input(s): PROBNP in the last 8760 hours. HbA1C: No results for input(s): HGBA1C in the last 72 hours. CBG: No results for input(s): GLUCAP in the last 168 hours. Lipid Profile: No results for input(s): CHOL, HDL, LDLCALC, TRIG, CHOLHDL, LDLDIRECT in the last 72 hours. Thyroid Function Tests: No results for input(s): TSH, T4TOTAL, FREET4, T3FREE, THYROIDAB in the last 72 hours. Anemia Panel: No results for input(s): VITAMINB12, FOLATE, FERRITIN, TIBC, IRON, RETICCTPCT in the last 72 hours. Urine analysis:    Component Value Date/Time   COLORURINE YELLOW 07/07/2015 1916   APPEARANCEUR CLEAR 07/07/2015 1916   LABSPEC 1.010 07/07/2015 1916   PHURINE 5.5 07/07/2015 1916   GLUCOSEU NEGATIVE 07/07/2015 1916   HGBUR SMALL (A) 07/07/2015 1916   BILIRUBINUR NEGATIVE 07/07/2015 1916   BILIRUBINUR negative 07/05/2015 [place] 07/07/2015 1916  PROTEINUR NEGATIVE 07/07/2015 1916   UROBILINOGEN 0.2 07/05/2015 1801   NITRITE NEGATIVE 07/07/2015 1916   LEUKOCYTESUR NEGATIVE 07/07/2015 1916    Radiological Exams on Admission: Dg Chest Port 1 View  Result Date: 05/16/2018 CLINICAL DATA:  Low hemoglobin. Lymphoma. Nausea vomiting. Frequent transfusions. EXAM: PORTABLE CHEST 1 VIEW COMPARISON:  Chest x-ray 03/20/2018, 07/07/2015.  PET-CT 04/25/2017. FINDINGS: Mediastinum hilar structures normal. Left perihilar atelectasis. No focal alveolar infiltrate. Stable mild bilateral pleural thickening consistent scarring. No pneumothorax. Cardiomegaly with normal pulmonary vascularity. Degenerative change thoracic spine. IMPRESSION: 1.  Left perihilar atelectasis. 2.  Cardiomegaly.  No pulmonary venous congestion. Electronically Signed   By: Marcello Moores  Register   On: 05/16/2018 14:41    EKG: Independently reviewed.    Assessment/Plan Principal Problem:   Gastrointestinal hemorrhage with melena Admit to  stepdown/inpatient. Keep n.p.o. for now. Continue supplemental oxygen. Protonix 40 mg IVP every 12 hours. Transfuse 4 units of PRBC. Follow-up hematocrit and hemoglobin. GI evaluation.  Active Problems:   Symptomatic anemia Transfuse 4 units of PRBC. Follow-up hematocrit and hemoglobin.    Pancytopenia (HCC) Monitor CBC. Follow-up with oncology as scheduled.    Protein-calorie malnutrition, severe (HCC) Continue protein drink supplementations.    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    Prostate cancer [facility]) Continue treatment per oncology team.     DVT prophylaxis: SCDs. Code Status: Full code. Family Communication: His wife and daughter were present in the ED room. Disposition Plan: Admit for PRBC transfusion and GI evaluation. Consults called: Gastroenterology was consulted by the EDP. Admission status: Inpatient/stepdown.   Reubin Milan MD [facility]  05/16/2018, 5:13 PM   This document was prepared using Dragon voice recognition software and may contain some unintended transcription errors.

## 2018-05-16 NOTE — ED Provider Notes (Signed)
[facility] DEPT Provider Note   CSN: [id number] Arrival date & time: 05/16/18  1257     History   Chief Complaint Chief Complaint  Patient presents with  . Abnormal Lab    HPI Joel [name] is a 78 y.o. male with past medical history of prostate cancer, lymphoma, hypertension, who presents today for evaluation of anemia.  He had an appointment at the cancer center this morning and labs were drawn.  He reports that over the past few days he has been generally feeling weak, with poor appetite which is been gradually worsening.  He did report that today he had dark tarry sticky bowel movement which was partially formed.  He denies any abdominal pain.    On the way here from the cancer center he started feeling short of breath and nauseous.  He reportedly dry heaves a few times however was not productive.  He was placed on 2 L of oxygen with shortness of breath, and has had a cough since then.  HPI  Past Medical History:  Diagnosis Date  . Anemia   . Aneurysm ([place])    in 1987, brain aneurysm  . Bilateral hydronephrosis   . History of radiation therapy 04/17/17- 05/07/17   Right neck treated to 32.5 Gy with 13 fx of 2.5 Gy  . mantle cell lymphoma dx'd 03/2017  . Mass of right side of neck   . Pneumonia    hx of   . Prostate CA ([place]) dx'd 2017  . Urinary retention     Patient Active Problem List   Diagnosis Date Noted  . Syncope 03/20/2018  . Hypotension 03/20/2018  . Pancytopenia ([place]) 03/20/2018  . Symptomatic anemia 03/20/2018  . Stroke ([place]) 04/22/2017  . Prostate cancer ([place]) 04/12/2017  . 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 Bladder outlet obstruction 08/01/2015  . Anemia 07/29/2015  . Elevated serum creatinine   . Elevated prostate specific antigen (PSA) 07/28/2015  . Acute renal failure superimposed on stage 3 chronic kidney disease ([place]) 07/28/2015  . Urinary retention 07/28/2015  . AKI  (acute kidney injury) ([place]) 07/08/2015  . Normocytic anemia 07/08/2015  . Hyperkalemia 07/08/2015  . Elevated blood pressure 07/08/2015  . Renal failure 07/08/2015  . Diarrhea 07/08/2015  . Protein-calorie malnutrition, severe ([place]) 07/08/2015  . Bilateral leg edema 07/08/2015    Past Surgical History:  Procedure Laterality Date  . MASS BIOPSY Right 03/27/2017   Procedure: OPEN NECK MASS BIOPSY OF THE RIGHT NECK;  Surgeon: Leta Baptist, MD;  Location: [facility];  Service: ENT;  Laterality: Right;  . PROSTATE BIOPSY N/A 07/30/2015   Procedure: PROSTATE BIOPSY;  Surgeon: Irine Seal, MD;  Location: [facility];  Service: Urology;  Laterality: N/A;  . TRANSURETHRAL RESECTION OF PROSTATE N/A 07/30/2015   Procedure: TRANSURETHRAL RESECTION OF THE PROSTATE (TURP);  Surgeon: Irine Seal, MD;  Location: [facility];  Service: Urology;  Laterality: N/A;        Home Medications    Prior to Admission medications   Medication Sig Start Date End Date Taking? Authorizing Provider  feeding supplement, ENSURE ENLIVE, (ENSURE ENLIVE) LIQD Take 237 mLs by mouth 2 (two) times daily between meals. 03/22/18  Yes Elodia Florence., MD  megestrol (MEGACE) 400 MG/10ML suspension Take 10 mLs (400 mg total) by mouth 2 (two) times daily. Patient not taking: Reported on 05/05/2018 03/10/18   Wyatt Portela, MD    Family History Family  History  Problem Relation Age of Onset  . Dementia Mother   . Heart disease Father   . Lung cancer Brother   . Lupus Sister     Social History Social History   Tobacco Use  . Smoking status: Former Smoker    Last attempt to quit: 04/28/1985    Years since quitting: 33.0  . Smokeless tobacco: Former Systems developer    Types: Chew  . Tobacco comment: quit 30-40 yrs. aog  Substance Use Topics  . Alcohol use: No    Alcohol/week: 0.0 standard drinks  . Drug use: No     Allergies   Patient has no known allergies.   Review of Systems Review of Systems  Constitutional: Positive for appetite  change and fatigue. Negative for chills and fever.  HENT: Negative for congestion.   Respiratory: Positive for cough. Negative for shortness of breath.   Cardiovascular: Negative for chest pain.  Gastrointestinal: Positive for nausea. Negative for abdominal pain, diarrhea and vomiting.  Genitourinary: Negative for dysuria.  Musculoskeletal: Negative for back pain.  Skin: Positive for pallor.  Neurological: Positive for weakness. Negative for headaches.  Psychiatric/Behavioral: Negative for confusion.  All other systems reviewed and are negative.    Physical Exam Updated Vital Signs BP (!) 109/57 (BP Location: Right Arm)   Pulse 93   Temp (!) 97.5 F (36.4 C) (Oral)   Resp 18   SpO2 100%   Physical Exam Vitals signs and nursing note reviewed.  Constitutional:      Appearance: He is underweight.     Comments: Chronically ill-appearing  HENT:     Head: Normocephalic and atraumatic.     Mouth/Throat:     Comments: Lips and oral mucosa is pale.  Neck:     Musculoskeletal: Neck supple.  Cardiovascular:     Rate and Rhythm: Normal rate and regular rhythm.     Heart sounds: No murmur.  Pulmonary:     Effort: Pulmonary effort is normal. No respiratory distress.     Breath sounds: Normal breath sounds.  Abdominal:     Palpations: Abdomen is soft.     Tenderness: There is no abdominal tenderness. There is no guarding or rebound.  Skin:    General: Skin is warm and dry.     Coloration: Skin is pale.     Comments: Lips, fingers are very pale.    Neurological:     Mental Status: He is alert.      ED Treatments / Results  Labs (all labs ordered are listed, but only abnormal results are displayed) Labs Reviewed  CBC - Abnormal; Notable for the following components:      Result Value   RBC 1.14 (*)    Hemoglobin 3.1 (*)    HCT 10.3 (*)    Platelets 56 (*)    nRBC 6.7 (*)    All other components within normal limits  OCCULT BLOOD X 1 CARD TO LAB, STOOL  PREPARE RBC  (CROSSMATCH)  TYPE AND SCREEN    EKG None  Radiology Dg Chest Port 1 View  Result Date: 05/16/2018 CLINICAL DATA:  Low hemoglobin. Lymphoma. Nausea vomiting. Frequent transfusions. EXAM: PORTABLE CHEST 1 VIEW COMPARISON:  Chest x-ray 03/20/2018, 07/07/2015.  PET-CT 04/25/2017. FINDINGS: Mediastinum hilar structures normal. Left perihilar atelectasis. No focal alveolar infiltrate. Stable mild bilateral pleural thickening consistent scarring. No pneumothorax. Cardiomegaly with normal pulmonary vascularity. Degenerative change thoracic spine. IMPRESSION: 1.  Left perihilar atelectasis. 2.  Cardiomegaly.  No pulmonary venous congestion. Electronically  Signed   ByMarcello Moores  Register   On: 05/16/2018 14:41    Procedures .Critical Care Performed by: Lorin Glass, PA-C Authorized by: Lorin Glass, PA-C   Critical care provider statement:    Critical care time (minutes):  45   Critical care time was exclusive of:  Separately billable procedures and treating other patients   Critical care was necessary to treat or prevent imminent or life-threatening deterioration of the following conditions:  Shock and circulatory failure   Critical care was time spent personally by me on the following activities:  Discussions with consultants, evaluation of patient's response to treatment, examination of patient, ordering and performing treatments and interventions, ordering and review of laboratory studies, ordering and review of radiographic studies, pulse oximetry, re-evaluation of patient's condition, obtaining history from patient or surrogate and review of old charts   (including critical care time)  Medications Ordered in ED Medications  0.9 %  sodium chloride infusion (has no administration in time range)     Initial Impression / Assessment and Plan / ED Course  I have reviewed the triage vital signs and the nursing notes.  Pertinent labs & imaging results that were available during  my care of the patient were reviewed by me and considered in my medical decision making (see chart for details).  Clinical Course as of May 17 2335  Fri May 16, 2018  1400 Hemoglobin(!!): 3.1 [EH]  1515 Spoke with Dr. Olevia Bowens who requests GI be involved.  Will consult GI.    [EH]  1640 Spoke with GI who will see patient tomorrow morning.    [EH]    Clinical Course User Index [EH] Lorin Glass, PA-C   Patient presents today from the cancer center for evaluation of anemia.  He had labs drawn there showing a hemoglobin of 2.8.  He reports having dark tarry sticky bowel movements with the first one today.  Repeat CBC ordered showing hemoglobin of 3.1.  His platelets are low at 56, baseline is around 200.  he denies any abdominal pain. nRBC is elevated consistent with response to anemia.     Rectal exam was not performed given wbc of 2.7.  His glucose is elevated at 291, with a creatinine of 1.32, about his baseline.  His anion gap is elevated at 19, suspect that this is related to anemia.   Type and screen was ordered, I ordered 2 units transfused with keeping 2 units ahead.  Patient was hemodynamically stable, not tachycardic.    CXR was ordered as patient reported cough, which did not show any acute findings.    This patient was seen as a shared visit with Dr. Venora Maples.    I spoke with Hospitalist Dr. Olevia Bowens who requested I consult GI.  Patient reports he has not been seen by GI before.  GI consult placed. I spoke with GI who will see the patient in consultation.   Final Clinical Impressions(s) / ED Diagnoses   Final diagnoses:  Symptomatic anemia  Thrombocytopenia [facility])    ED Discharge Orders    None       Ollen Gross 05/16/18 2337    Jola Schmidt, MD 05/18/18 [phone]

## 2018-05-16 NOTE — Consult Note (Addendum)
Consultation  Referring Provider:     [facility] Primary Care Physician:  [name], MD Primary Gastroenterologist:        Althia Forts Reason for Consultation:     Anemia            HPI:   Shivaan Tierno is a 78 y.o. male with history of prostate cancer, mantle cell lymphoma, right neck mass status post resection, palliative radiation and chemotherapy.  Last dose of chemo therapy October 2019, on hold due to pancytopenia. Labs this morning showed hemoglobin 2.8, WBC 2.7 and platelet counts 52 significantly lower compared to last week.  On January 13 hemoglobin 8.6, WBC 4.4 and platelet 104. He noticed dark stool about once or twice in the past 2 weeks.  Denies any abdominal pain, nausea, vomiting, rectal bleeding or blood in stool.  No history of NSAIDs or alcohol use. He was more lethargic in the past 2 weeks, was sleeping more with decreased energy.  In the ER vitals stable, heart rate 83, blood pressure 120/79, O2 sat 100% on room air.  Started PRBC transfusion.   Past Medical History:  Diagnosis Date  . Anemia   . Aneurysm ([place])    in 1987, brain aneurysm  . Bilateral hydronephrosis   . History of radiation therapy 04/17/17- 05/07/17   Right neck treated to 32.5 Gy with 13 fx of 2.5 Gy  . mantle cell lymphoma dx'd 03/2017  . Mass of right side of neck   . Pneumonia    hx of   . Prostate CA ([place]) dx'd 2017  . Urinary retention     Past Surgical History:  Procedure Laterality Date  . MASS BIOPSY Right 03/27/2017   Procedure: OPEN NECK MASS BIOPSY OF THE RIGHT NECK;  Surgeon: Leta Baptist, MD;  Location: [facility];  Service: ENT;  Laterality: Right;  . PROSTATE BIOPSY N/A 07/30/2015   Procedure: PROSTATE BIOPSY;  Surgeon: Irine Seal, MD;  [facility];  Service: Urology;  Laterality: N/A;  . TRANSURETHRAL RESECTION OF PROSTATE N/A 07/30/2015   Procedure: TRANSURETHRAL RESECTION OF THE PROSTATE (TURP);  Surgeon: Irine Seal, MD;  [facility];  Service: Urology;  Laterality:  N/A;    Family History  Problem Relation Age of Onset  . Dementia Mother   . Heart disease Father   . Lung cancer Brother   . Lupus Sister      Social History   Tobacco Use  . Smoking status: Former Smoker    Last attempt to quit: 04/28/1985    Years since quitting: 33.0  . Smokeless tobacco: Former Systems developer    Types: Chew  . Tobacco comment: quit 30-40 yrs. aog  Substance Use Topics  . Alcohol use: No    Alcohol/week: 0.0 standard drinks  . Drug use: No    Prior to Admission medications   Medication Sig Start Date End Date Taking? Authorizing Provider  feeding supplement, ENSURE ENLIVE, (ENSURE ENLIVE) LIQD Take 237 mLs by mouth 2 (two) times daily between meals. 03/22/18  Yes Elodia Florence., MD  megestrol (MEGACE) 400 MG/10ML suspension Take 10 mLs (400 mg total) by mouth 2 (two) times daily. Patient not taking: Reported on 05/05/2018 03/10/18   Wyatt Portela, MD    Current Facility-Administered Medications  Medication Dose Route Frequency Provider Last Rate Last Dose  . 0.9 %  sodium chloride infusion (Manually program via Guardrails IV Fluids)   Intravenous Once Reubin Milan, MD      .  0.9 %  sodium chloride infusion  10 mL/hr Intravenous Once Reubin Milan, MD      . acetaminophen (TYLENOL) tablet 650 mg  650 mg Oral Q6H PRN Reubin Milan, MD       Or  . acetaminophen (TYLENOL) suppository 650 mg  650 mg Rectal Q6H PRN Reubin Milan, MD      . ondansetron [facility]) tablet 4 mg  4 mg Oral Q6H PRN Reubin Milan, MD       Or  . ondansetron [facility]) injection 4 mg  4 mg Intravenous Q6H PRN Reubin Milan, MD        Allergies as of 05/16/2018  . (No Known Allergies)     Review of Systems:    This is positive for those things mentioned in the HPI. All other review of systems are negative.       Physical Exam:  Vital signs in last 24 hours: Temp:  [97.5 F (36.4 C)-98.2 F (36.8 C)] 98.2 F (36.8 C) (01/24 1802) Pulse  Rate:  [67-93] 67 (01/24 1802) Resp:  [18-20] 20 (01/24 1802) BP: (97-150)/(57-85) 134/75 (01/24 1802) SpO2:  [96 %-100 %] 96 % (01/24 1802) Weight:  [52.6 kg] 52.6 kg (01/24 1722) Last BM Date: (PTA\)  General:  Cachectic appearing, no acute distress Eyes:  anicteric, pale appearing. Lungs: Clear to auscultation bilaterally. Heart:  S1S2, no rubs, murmurs, gallops. Abdomen:  soft, non-tender, no hepatosplenomegaly, hernia, or mass and BS+.  Extremities:   no edema Neuro:  A&O x 3.  Psych:  appropriate mood and  Affect.   Data Reviewed:   LAB RESULTS: Recent Labs    05/16/18 1131 05/16/18 1310  WBC 2.7* 4.2  HGB 2.8* 3.1*  HCT 9.2* 10.3*  PLT 52* 56*   BMET Recent Labs    05/16/18 1131  NA 144  K 4.2  CL 107  CO2 18*  GLUCOSE 291*  BUN 60*  CREATININE 1.32*  CALCIUM 8.7*   LFT Recent Labs    05/16/18 1131  PROT 5.4*  ALBUMIN 2.1*  AST 17  ALT 15  ALKPHOS 60  BILITOT 0.4   PT/INR No results for input(s): LABPROT, INR in the last 72 hours.  STUDIES: Dg Chest Port 1 View  Result Date: 05/16/2018 CLINICAL DATA:  Low hemoglobin. Lymphoma. Nausea vomiting. Frequent transfusions. EXAM: PORTABLE CHEST 1 VIEW COMPARISON:  Chest x-ray 03/20/2018, 07/07/2015.  PET-CT 04/25/2017. FINDINGS: Mediastinum hilar structures normal. Left perihilar atelectasis. No focal alveolar infiltrate. Stable mild bilateral pleural thickening consistent scarring. No pneumothorax. Cardiomegaly with normal pulmonary vascularity. Degenerative change thoracic spine. IMPRESSION: 1.  Left perihilar atelectasis. 2.  Cardiomegaly.  No pulmonary venous congestion. Electronically Signed   By: Marcello Moores  Register   On: 05/16/2018 14:41     PREVIOUS ENDOSCOPIES:            None    Impression / Plan:   78 year old male with history of brain aneurysm, prostate cancer, bilateral hydronephrosis, right neck mass mantle cell lymphoma status post surgical resection, palliative radiation and  chemotherapy. chemotherapy on hold since October 2019 due to pancytopenia Bone marrow biopsy March 31, 2018 showed mantle cell lymphoma involvement and marked fibrosis.  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 Hemoglobin 2.8, and though patient gives history of dark stool on couple occasions, not adequate to account for 7 or 8 g drop in hemoglobin in the past 10 days.  He would be  having significant bloody stool/GI hemorrhage with hematochezia if anemia is all due to GI blood loss.  Please consult hematology/oncology No plan for endoscopic intervention at this point Advance diet as tolerated Monitor hemoglobin and transfuse as needed  Dr. Benson Norway is covering this weekend, will not plan to round but will be available if have any questions     K. Denzil Magnuson , MD [phone]

## 2018-05-16 NOTE — ED Notes (Signed)
Bed: WA09 Expected date:  Expected time:  Means of arrival:  Comments: Hold-transfer from cancer center

## 2018-05-16 NOTE — ED Notes (Signed)
Date and time results received: 05/16/18 1:40 PM (use smartphrase ".now" to insert current time)  Test: HGB 3.1 Critical Value:   Name of Provider Notified: Kaitlin RN   Orders Received? Or Actions Taken?:

## 2018-05-16 NOTE — ED Triage Notes (Signed)
Pt is coming from the cancer center with a reported HgB of 2.8. Pt reportedly has lymphoma. Pt is not currently getting active chemo and is not a hospice patient. Pt has N/V at this time.  Pt reportedly gets transfused frequently.  Pt is pale and feels weak at this time.

## 2018-05-17 ENCOUNTER — Observation Stay (HOSPITAL_COMMUNITY): Payer: Medicare HMO

## 2018-05-17 ENCOUNTER — Inpatient Hospital Stay: Payer: Medicare HMO

## 2018-05-17 DIAGNOSIS — C8311 Mantle cell lymphoma, lymph nodes of head, face, and neck: Secondary | ICD-10-CM | POA: Diagnosis not present

## 2018-05-17 DIAGNOSIS — D649 Anemia, unspecified: Secondary | ICD-10-CM | POA: Diagnosis not present

## 2018-05-17 DIAGNOSIS — T68XXXD Hypothermia, subsequent encounter: Secondary | ICD-10-CM | POA: Diagnosis not present

## 2018-05-17 DIAGNOSIS — D61818 Other pancytopenia: Secondary | ICD-10-CM

## 2018-05-17 DIAGNOSIS — T68XXXA Hypothermia, initial encounter: Secondary | ICD-10-CM

## 2018-05-17 DIAGNOSIS — D696 Thrombocytopenia, unspecified: Secondary | ICD-10-CM

## 2018-05-17 DIAGNOSIS — K921 Melena: Secondary | ICD-10-CM | POA: Diagnosis not present

## 2018-05-17 LAB — CBC WITH DIFFERENTIAL/PLATELET
ABS IMMATURE GRANULOCYTES: 0.12 10*3/uL — AB (ref 0.00–0.07)
Abs Immature Granulocytes: 0.13 10*3/uL — ABNORMAL HIGH (ref 0.00–0.07)
Basophils Absolute: 0 10*3/uL (ref 0.0–0.1)
Basophils Absolute: 0 10*3/uL (ref 0.0–0.1)
Basophils Relative: 1 %
Basophils Relative: 1 %
Eosinophils Absolute: 0 10*3/uL (ref 0.0–0.5)
Eosinophils Absolute: 0 10*3/uL (ref 0.0–0.5)
Eosinophils Relative: 1 %
Eosinophils Relative: 0 %
HCT: 26.3 % — ABNORMAL LOW (ref 39.0–52.0)
HEMATOCRIT: 25.6 % — AB (ref 39.0–52.0)
Hemoglobin: 8.4 g/dL — ABNORMAL LOW (ref 13.0–17.0)
Hemoglobin: 8.7 g/dL — ABNORMAL LOW (ref 13.0–17.0)
IMMATURE GRANULOCYTES: 8 %
Immature Granulocytes: 8 %
Lymphocytes Relative: 26 %
Lymphocytes Relative: 23 %
Lymphs Abs: 0.4 10*3/uL — ABNORMAL LOW (ref 0.7–4.0)
Lymphs Abs: 0.4 10*3/uL — ABNORMAL LOW (ref 0.7–4.0)
MCH: 28.2 pg (ref 26.0–34.0)
MCH: 28.2 pg (ref 26.0–34.0)
MCHC: 32.8 g/dL (ref 30.0–36.0)
MCHC: 33.1 g/dL (ref 30.0–36.0)
MCV: 85.9 fL (ref 80.0–100.0)
MCV: 85.1 fL (ref 80.0–100.0)
Monocytes Absolute: 0.2 10*3/uL (ref 0.1–1.0)
Monocytes Absolute: 0.2 10*3/uL (ref 0.1–1.0)
Monocytes Relative: 10 %
Monocytes Relative: 10 %
Neutro Abs: 0.8 10*3/uL — ABNORMAL LOW (ref 1.7–7.7)
Neutro Abs: 0.9 10*3/uL — ABNORMAL LOW (ref 1.7–7.7)
Neutrophils Relative %: 54 %
Neutrophils Relative %: 58 %
PLATELETS: 30 10*3/uL — AB (ref 150–400)
Platelets: 33 10*3/uL — ABNORMAL LOW (ref 150–400)
RBC: 2.98 MIL/uL — ABNORMAL LOW (ref 4.22–5.81)
RBC: 3.09 MIL/uL — ABNORMAL LOW (ref 4.22–5.81)
RDW: 14.2 % (ref 11.5–15.5)
RDW: 14.7 % (ref 11.5–15.5)
WBC: 1.5 10*3/uL — ABNORMAL LOW (ref 4.0–10.5)
WBC: 1.6 10*3/uL — ABNORMAL LOW (ref 4.0–10.5)
nRBC: 7.4 % — ABNORMAL HIGH (ref 0.0–0.2)
nRBC: 9 % — ABNORMAL HIGH (ref 0.0–0.2)

## 2018-05-17 LAB — URINALYSIS, ROUTINE W REFLEX MICROSCOPIC
BILIRUBIN URINE: NEGATIVE
Glucose, UA: NEGATIVE mg/dL
Hgb urine dipstick: NEGATIVE
Ketones, ur: NEGATIVE mg/dL
NITRITE: NEGATIVE
Protein, ur: NEGATIVE mg/dL
SPECIFIC GRAVITY, URINE: 1.013 (ref 1.005–1.030)
pH: 6 (ref 5.0–8.0)

## 2018-05-17 LAB — PROSTATE-SPECIFIC AG, SERUM (LABCORP): PROSTATE SPECIFIC AG, SERUM: 8.1 ng/mL — AB (ref 0.0–4.0)

## 2018-05-17 LAB — RETICULOCYTES
Immature Retic Fract: 23.3 % — ABNORMAL HIGH (ref 2.3–15.9)
RBC.: 3.01 MIL/uL — ABNORMAL LOW (ref 4.22–5.81)
Retic Count, Absolute: 28.6 10*3/uL (ref 19.0–186.0)
Retic Ct Pct: 1 % (ref 0.4–3.1)

## 2018-05-17 LAB — COMPREHENSIVE METABOLIC PANEL
ALT: 17 U/L (ref 0–44)
AST: 25 U/L (ref 15–41)
Albumin: 2.4 g/dL — ABNORMAL LOW (ref 3.5–5.0)
Alkaline Phosphatase: 52 U/L (ref 38–126)
Anion gap: 10 (ref 5–15)
BUN: 61 mg/dL — ABNORMAL HIGH (ref 8–23)
CO2: 21 mmol/L — ABNORMAL LOW (ref 22–32)
Calcium: 8.1 mg/dL — ABNORMAL LOW (ref 8.9–10.3)
Chloride: 114 mmol/L — ABNORMAL HIGH (ref 98–111)
Creatinine, Ser: 1.19 mg/dL (ref 0.61–1.24)
GFR calc Af Amer: >60 mL/min (ref >60)
GFR calc non Af Amer: 59 mL/min — ABNORMAL LOW (ref >60)
Glucose, Bld: 110 mg/dL — ABNORMAL HIGH (ref 70–99)
Potassium: 3.9 mmol/L (ref 3.5–5.1)
Sodium: 145 mmol/L (ref 135–145)
Total Bilirubin: 0.7 mg/dL (ref 0.3–1.2)
Total Protein: 5.1 g/dL — ABNORMAL LOW (ref 6.5–8.1)

## 2018-05-17 LAB — TSH: TSH: 1.99 u[IU]/mL (ref 0.350–4.500)

## 2018-05-17 LAB — DIRECT ANTIGLOBULIN TEST (NOT AT ARMC)
DAT, IgG: NEGATIVE
DAT, complement: NEGATIVE

## 2018-05-17 LAB — LACTATE DEHYDROGENASE: LDH: 695 U/L — ABNORMAL HIGH (ref 98–192)

## 2018-05-17 LAB — SAVE SMEAR(SSMR), FOR PROVIDER SLIDE REVIEW

## 2018-05-17 MED ORDER — PANTOPRAZOLE SODIUM 40 MG PO TBEC
40.0000 mg | DELAYED_RELEASE_TABLET | Freq: Two times a day (BID) | ORAL | Status: DC
  Administered 2018-05-17 – 2018-05-19 (×6): 40 mg via ORAL
  Filled 2018-05-17 (×6): qty 1

## 2018-05-17 MED ORDER — ENSURE ENLIVE PO LIQD
237.0000 mL | Freq: Two times a day (BID) | ORAL | Status: DC
  Administered 2018-05-17 – 2018-05-27 (×17): 237 mL via ORAL

## 2018-05-17 NOTE — Progress Notes (Signed)
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  Patient noted to have a hemoglobin of 3.1 in the ED patient did endorse black stool 3 days prior to admission however none since then.  Patient also noted to be severely pancytopenic.  Patient transfused 4 units of packed red blood cells.  GI consulted.   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  Patient does endorse dark stool about 3 days prior to admission however none since.  Patient noted to have a hemoglobin of 8.6 on 05/05/2018.  Initial concern for acute GI bleed however patient denies any ongoing GI bleed apart from 1 dark stool 3 days prior to admission.  Patient transfused 4 units packed red blood cells.  He had 8.4 this morning.  Patient with some symptomatic improvement.  Patient seen in consultation by gastroenterology who feel patient's anemia/pancytopenia likely secondary to fibrosis and involvement of bone marrow with mantle cell lymphoma and recommending hematology oncology consultation.  Anemia panel was not  obtained on admission.  Patient noted to have a normocytic anemia.  LFTs within normal limits.  Bilirubin within normal limits.  Check LDH, haptoglobin, peripheral smear,DAT, reticulocyte count, PSA.  Continue PPI.  Consult with hematology/oncology for further evaluation and management.  GI was following.  2.  Severe pancytopenia Concern likely secondary to bone marrow invasion/fibrosis from mantle cell lymphoma versus secondary to marrow toxicity from ibrutinib.  Check LDH, haptoglobin, peripheral smear, reticulocyte count, PSA, DAT.  Consult with hematology/oncology for further evaluation and management.  Follow.  3.  Castration sensitive prostate cancer with lymphadenopathy Patient receiving Lupron injections every 4 months.  Next injection scheduled for February 2020.  Per oncology.  4.  Stage III mantle cell lymphoma diagnosed in December 2018 Per patient's oncologist note from 05/05/2018 patient currently off therapy at this time after completing a course of ibrutinib with complete response.  Treatment discontinued secondary to pancytopenia.  Outpatient follow-up with oncology.  5.  Severe protein calorie malnutrition Secondary to problems #3 and 4.  Place on Ensure.  Nutritional supplementation.  6.  Hypothermia Questionable etiology.  Chest x-ray on admission was negative for any acute infiltrate.  Repeat chest x-ray this morning unremarkable.  Check a UA with cultures and sensitivities.  Check blood cultures x2.  Repeat temperature at 98.3.  Follow.   DVT prophylaxis: SCDs Code Status: Full Family Communication: Updated patient.  No family at bedside. Disposition Plan: Remain in stepdown unit.   Consultants:   Oncology/hematology pending  Gastroenterology: Dr. Silverio Decamp 05/16/2018  Procedures:   CXR, 05/16/2018,05/17/2018  Transfuse 4 units packed red blood cells 05/16/2018-05/17/2018  Antimicrobials:   None  Subjective: Patient sitting up in bed.  States he feels better  than on admission.  States shortness of breath improved.  Denies any chest pain.  Denies any nausea or vomiting.  Denies any overt bleeding.  Patient states 3 days prior to admission had a dark stool however none since.  Objective: Vitals:   05/17/18 0700 05/17/18 0735 05/17/18 0800 05/17/18 0930  BP: 120/70  132/72   Pulse: 74  73   Resp: 13  14   Temp:  (!) 97.5 F (36.4 C)  98.3 F (36.8 C)  TempSrc:  Oral  Oral  SpO2: 100%  98%   Weight:      Height:        Intake/Output Summary (Last 24 hours) at 05/17/2018 1012 Last data filed at 05/17/2018 0500 Gross per 24 hour  Intake 1575 ml  Output 800 ml  Net 775 ml   Filed Weights   05/16/18 1722  Weight: 52.6 kg    Examination:  General exam: Appears calm and comfortable  Respiratory system: Clear to auscultation. Respiratory effort normal. Cardiovascular system: S1 & S2 heard, RRR. No JVD, murmurs, rubs, gallops or clicks. No pedal edema. Gastrointestinal system: Abdomen is nondistended, soft and nontender. No organomegaly or masses felt. Normal bowel sounds heard. Central nervous system: Alert and oriented. No focal neurological deficits. Extremities: Symmetric 5 x 5 power. Skin: No rashes, lesions or ulcers Psychiatry: Judgement and insight appear normal. Mood & affect appropriate.     Data Reviewed: I have personally reviewed following labs and imaging studies  CBC: Recent Labs  Lab 05/16/18 1131 05/16/18 1310 05/17/18 0237  WBC 2.7* 4.2 1.5*  NEUTROABS 1.2*  --  0.8*  HGB 2.8* 3.1* 8.4*  HCT 9.2* 10.3* 25.6*  MCV 91.1 90.4 85.9  PLT 52* 56* 33*   Basic Metabolic Panel: Recent Labs  Lab 05/16/18 1131 05/17/18 0237  NA 144 145  K 4.2 3.9  CL 107 114*  CO2 18* 21*  GLUCOSE 291* 110*  BUN 60* 61*  CREATININE 1.32* 1.19  CALCIUM 8.7* 8.1*   GFR: Estimated Creatinine Clearance: 39.9 mL/min (by C-G formula based on SCr of 1.19 mg/dL). Liver Function Tests: Recent Labs  Lab 05/16/18 1131  05/17/18 0237  AST 17 25  ALT 15 17  ALKPHOS 60 52  BILITOT 0.4 0.7  PROT 5.4* 5.1*  ALBUMIN 2.1* 2.4*   No results for input(s): LIPASE, AMYLASE in the last 168 hours. No results for input(s): AMMONIA in the last 168 hours. Coagulation Profile: No results for input(s): INR, PROTIME in the last 168 hours. Cardiac Enzymes: No results for input(s): CKTOTAL, CKMB, CKMBINDEX, TROPONINI in the last 168 hours. BNP (last 3 results) No results for input(s): PROBNP in the last 8760 hours. HbA1C: No results for input(s): HGBA1C in the last 72 hours. CBG: Recent Labs  Lab 05/16/18 1807  GLUCAP 96   Lipid Profile: No results for input(s): CHOL, HDL, LDLCALC, TRIG, CHOLHDL, LDLDIRECT in the last 72 hours. Thyroid Function Tests: No results for input(s): TSH, T4TOTAL, FREET4, T3FREE, THYROIDAB in the last 72 hours. Anemia Panel: Recent Labs    05/17/18 0913  RETICCTPCT 1.0   Sepsis Labs: No results for input(s): PROCALCITON, LATICACIDVEN in the last 168 hours.  Recent Results (from the past 240 hour(s))  MRSA PCR Screening     Status: None   Collection Time: 05/16/18  5:35 PM  Result Value Ref Range Status   MRSA by PCR NEGATIVE NEGATIVE Final  Comment:        The GeneXpert MRSA Assay (FDA approved for NASAL specimens only), is one component of a comprehensive MRSA colonization surveillance program. It is not intended to diagnose MRSA infection nor to guide or monitor treatment for MRSA infections. Performed at [facility], [place] [address], [place], [place] [address]          Radiology Studies: Dg Chest [facility] 1 View  Result Date: 05/17/2018 CLINICAL DATA:  History of prostate cancer, status post resection of a RIGHT neck mass with palliative radiation chemotherapy. Increased lethargy for the past 2 weeks. EXAM: PORTABLE CHEST 1 VIEW COMPARISON:  Chest x-rays dated 05/16/2018 and 03/20/2018. FINDINGS: Heart size and mediastinal contours are grossly  stable in size and configuration. Stable mild atelectasis within the LEFT perihilar lung. No new lung findings. No pleural effusion or pneumothorax seen. Osseous structures about the chest are unremarkable. IMPRESSION: No active disease.  No evidence of pneumonia or pulmonary edema. Electronically Signed   By: Franki Cabot M.D.   On: 05/17/2018 09:04   Dg Chest Port 1 View  Result Date: 05/16/2018 CLINICAL DATA:  Low hemoglobin. Lymphoma. Nausea vomiting. Frequent transfusions. EXAM: PORTABLE CHEST 1 VIEW COMPARISON:  Chest x-ray 03/20/2018, 07/07/2015.  PET-CT 04/25/2017. FINDINGS: Mediastinum hilar structures normal. Left perihilar atelectasis. No focal alveolar infiltrate. Stable mild bilateral pleural thickening consistent scarring. No pneumothorax. Cardiomegaly with normal pulmonary vascularity. Degenerative change thoracic spine. IMPRESSION: 1.  Left perihilar atelectasis. 2.  Cardiomegaly.  No pulmonary venous congestion. Electronically Signed   By: Marcello Moores  Register   On: 05/16/2018 14:41        Scheduled Meds: . sodium chloride   Intravenous Once  . mouth rinse  15 mL Mouth Rinse BID  . pantoprazole  40 mg Oral BID AC   Continuous Infusions: . sodium chloride       LOS: 0 days    Time spent: 40 mins    Irine Seal, MD [facility]  If 7PM-7AM, please contact night-coverage [email or web address] Password [email or web address] 05/17/2018, 10:12 AM

## 2018-05-17 NOTE — Progress Notes (Signed)
HEMATOLOGY-ONCOLOGY PROGRESS NOTE  SUBJECTIVE: Extremely severe anemia with a hemoglobin of 2.8 status post 4 units of PRBC.  Pancytopenia thought to be related to ibrutinib therapy, held ibrutinib since October 2019.  Joel [name] is a 78 year old with a history of metastatic prostate cancer under anti-androgen therapy as well as history of mantle cell lymphoma diagnosed in 2018 status post radiation and ibrutinib.  He was in complete response from the mantle cell lymphoma standpoint.  Ibrutinib was held because of pancytopenia in October 2019 by Dr. Alen Blew.  He had required 2 units of PRBC about 3 to 4 weeks ago.  He came in with severe anemia with a hemoglobin of 2.8 and was immediately transfused 4 units of PRBC.  We are consulted to assess the cause of the pancytopenia and guide the treatment plan.  He had mild dark-colored stool over the past week or so.  GI felt it is unlikely to be the cause of his severe anemia and a hematology work-up was recommended.  OBJECTIVE: REVIEW OF SYSTEMS:   Constitutional: Frail individual without any fevers Eyes: Denies blurriness of vision Ears, nose, mouth, throat, and face: Denies sore throat Respiratory: Shortness of breath to minimal exertion has improved after blood transfusion Cardiovascular: Denies palpitation, chest discomfort Gastrointestinal: Melena Skin: Denies abnormal skin rashes Lymphatics: Denies new lymphadenopathy or easy bruising Neurological: Generalized weaknesses Behavioral/Psych: Mood is stable, no new changes  Extremities: No lower extremity edema All other systems were reviewed with the patient and are negative.  I have reviewed the past medical history, past surgical history, social history and family history with the patient and they are unchanged from previous note.   PHYSICAL EXAMINATION: ECOG PERFORMANCE STATUS: 3 - Symptomatic, >50% confined to bed  Vitals:   05/17/18 0800 05/17/18 0930  BP: 132/72   Pulse: 73   Resp:  14   Temp:  98.3 F (36.8 C)  SpO2: 98%    Filed Weights   05/16/18 1722  Weight: 115 lb 15.4 oz (52.6 kg)    GENERAL:alert, no distress and comfortable SKIN: skin color, texture, turgor are normal, no rashes or significant lesions EYES: normal, Conjunctiva are pink and non-injected, sclera clear OROPHARYNX:no exudate, no erythema and lips, buccal mucosa, and tongue normal  NECK: supple, thyroid normal size, non-tender, without nodularity LYMPH:  no palpable lymphadenopathy in the cervical, axillary or inguinal LUNGS: clear to auscultation and percussion with normal breathing effort HEART: regular rate & rhythm and no murmurs and no lower extremity edema ABDOMEN:abdomen soft, non-tender and normal bowel sounds Musculoskeletal:no cyanosis of digits and no clubbing  NEURO: alert & oriented x 3 with fluent speech, no focal motor/sensory deficits  LABORATORY DATA:  I have reviewed the data as listed CMP Latest Ref Rng & Units 05/17/2018 05/16/2018 03/26/2018  Glucose 70 - 99 mg/dL 110(H) 291(H) 112(H)  BUN 8 - 23 mg/dL 61(H) 60(H) 36(H)  Creatinine 0.61 - 1.24 mg/dL 1.19 1.32(H) 1.06  Sodium 135 - 145 mmol/L 145 144 142  Potassium 3.5 - 5.1 mmol/L 3.9 4.2 4.4  Chloride 98 - 111 mmol/L 114(H) 107 110  CO2 22 - 32 mmol/L 21(L) 18(L) 22  Calcium 8.9 - 10.3 mg/dL 8.1(L) 8.7(L) 8.6(L)  Total Protein 6.5 - 8.1 g/dL 5.1(L) 5.4(L) 5.9(L)  Total Bilirubin 0.3 - 1.2 mg/dL 0.7 0.4 0.3  Alkaline Phos 38 - 126 U/L 52 60 71  AST 15 - 41 U/L 25 17 75(H)  ALT 0 - 44 U/L 17 15 124(H)    Lab  Results  Component Value Date   WBC 1.5 (L) 05/17/2018   HGB 8.4 (L) 05/17/2018   HCT 25.6 (L) 05/17/2018   MCV 85.9 05/17/2018   PLT 33 (L) 05/17/2018   NEUTROABS 0.8 (L) 05/17/2018    ASSESSMENT AND PLAN: 1.  Severe anemia: After 4 units of PRBC his hemoglobin is up to 8.4. There is no benefit to doing iron studies because of the transfusion. I reviewed his peripheral smear I do not see any  schistocytes although I could be looking at transfuse blood.  There is scarcity of white blood cells and slight left shift.  Also scarcity of platelets.  Lab review: LDH 695 Reticulocyte: 1%, immature reticulocyte fraction 23.3% PSA is pending DAT: Pending  2.  Neutropenia: ANC 0.8 3.  Thrombocytopenia: Platelets 33 Part of the problem is significant transfusion which may have impacted the platelet count. We will continue to watch and monitor these numbers. Since he does not have any fever there is no need of antibiotics.  I discussed with the patient that he will need a bone marrow biopsy for further evaluation. I will request interventional radiology to do the bone marrow on Monday. Differential diagnosis is either lymphoma versus prostate cancer infiltration of bone marrow. Continue supportive care and call us if there are any additional needs or concerns.   Dr. Alen Blew will follow the patient from Monday onwards.

## 2018-05-18 DIAGNOSIS — K921 Melena: Secondary | ICD-10-CM | POA: Diagnosis not present

## 2018-05-18 DIAGNOSIS — D649 Anemia, unspecified: Secondary | ICD-10-CM | POA: Diagnosis not present

## 2018-05-18 DIAGNOSIS — T68XXXD Hypothermia, subsequent encounter: Secondary | ICD-10-CM | POA: Diagnosis not present

## 2018-05-18 DIAGNOSIS — N390 Urinary tract infection, site not specified: Secondary | ICD-10-CM

## 2018-05-18 LAB — COMPREHENSIVE METABOLIC PANEL
ALT: 12 U/L (ref 0–44)
AST: 24 U/L (ref 15–41)
Albumin: 2.3 g/dL — ABNORMAL LOW (ref 3.5–5.0)
Alkaline Phosphatase: 50 U/L (ref 38–126)
Anion gap: 8 (ref 5–15)
BUN: 44 mg/dL — ABNORMAL HIGH (ref 8–23)
CHLORIDE: 112 mmol/L — AB (ref 98–111)
CO2: 21 mmol/L — ABNORMAL LOW (ref 22–32)
Calcium: 8 mg/dL — ABNORMAL LOW (ref 8.9–10.3)
Creatinine, Ser: 1.17 mg/dL (ref 0.61–1.24)
GFR calc Af Amer: >60 mL/min (ref >60)
Glucose, Bld: 128 mg/dL — ABNORMAL HIGH (ref 70–99)
Potassium: 3.7 mmol/L (ref 3.5–5.1)
Sodium: 141 mmol/L (ref 135–145)
Total Bilirubin: 0.8 mg/dL (ref 0.3–1.2)
Total Protein: 5.2 g/dL — ABNORMAL LOW (ref 6.5–8.1)

## 2018-05-18 LAB — HAPTOGLOBIN: Haptoglobin: 351 mg/dL (ref 34–355)

## 2018-05-18 LAB — PSA, TOTAL AND FREE
PSA, Free Pct: 4.8 %
PSA, Free: 0.39 ng/mL
Prostate Specific Ag, Serum: 8.1 ng/mL — ABNORMAL HIGH (ref 0.0–4.0)

## 2018-05-18 MED ORDER — TBO-FILGRASTIM 480 MCG/0.8ML ~~LOC~~ SOSY
480.0000 ug | PREFILLED_SYRINGE | Freq: Every day | SUBCUTANEOUS | Status: DC
  Administered 2018-05-18 – 2018-05-19 (×2): 480 ug via SUBCUTANEOUS
  Filled 2018-05-18 (×3): qty 0.8

## 2018-05-18 MED ORDER — SODIUM CHLORIDE 0.9 % IV SOLN
1.0000 g | INTRAVENOUS | Status: DC
  Administered 2018-05-18 – 2018-05-20 (×3): 1 g via INTRAVENOUS
  Filled 2018-05-18 (×2): qty 1
  Filled 2018-05-18: qty 10

## 2018-05-18 NOTE — Consult Note (Signed)
Chief Complaint: Patient was seen in consultation today for pancytopenia.  Referring Physician(s): Nicholas Lose  Supervising Physician: Aletta Edouard  Patient Status: [facility]  History of Present Illness: Joel [name] is a 78 y.o. male with a past medical history of pneumonia, prostate cancer, urinary retention, bilateral hydronephrosis, mantle cell lymphoma, and anemia. He presented to [facility] ED 05/16/2018 after being sent by the cancer center for evaluation of low hgb (2.9 g/dL). He was found to have symptomatic anemia secondary to GI bleed and was admitted for further evaluation/management. Upon workup, he was also found to have pancytopenia. Oncology was consulted. He has hx pancytopenia in 01/2018 thought to be secondary to ibrutinib, which was held at that time.  IR requested by Dr. Lindi Adie for possible image-guided bone marrow aspiration/biopsy. Patient awake and alert sitting in bed eating lunch with no complaints at this time. Denies fever, chills, chest pain, dyspnea, abdominal pain, or headache.   Past Medical History:  Diagnosis Date  . Anemia   . Aneurysm ([place])    in 1987, brain aneurysm  . Bilateral hydronephrosis   . History of radiation therapy 04/17/17- 05/07/17   Right neck treated to 32.5 Gy with 13 fx of 2.5 Gy  . mantle cell lymphoma dx'd 03/2017  . Mass of right side of neck   . Pneumonia    hx of   . Prostate CA ([place]) dx'd 2017  . Urinary retention     Past Surgical History:  Procedure Laterality Date  . MASS BIOPSY Right 03/27/2017   Procedure: OPEN NECK MASS BIOPSY OF THE RIGHT NECK;  Surgeon: Leta Baptist, MD;  Location: [facility];  Service: ENT;  Laterality: Right;  . PROSTATE BIOPSY N/A 07/30/2015   Procedure: PROSTATE BIOPSY;  Surgeon: Irine Seal, MD;  Location: [facility];  Service: Urology;  Laterality: N/A;  . TRANSURETHRAL RESECTION OF PROSTATE N/A 07/30/2015   Procedure: TRANSURETHRAL RESECTION OF THE PROSTATE (TURP);  Surgeon: Irine Seal, MD;   Location: [facility];  Service: Urology;  Laterality: N/A;    Allergies: Patient has no known allergies.  Medications: Prior to Admission medications   Medication Sig Start Date End Date Taking? Authorizing Provider  feeding supplement, ENSURE ENLIVE, (ENSURE ENLIVE) LIQD Take 237 mLs by mouth 2 (two) times daily between meals. 03/22/18  Yes Elodia Florence., MD  megestrol (MEGACE) 400 MG/10ML suspension Take 10 mLs (400 mg total) by mouth 2 (two) times daily. Patient not taking: Reported on 05/05/2018 03/10/18   Wyatt Portela, MD     Family History  Problem Relation Age of Onset  . Dementia Mother   . Heart disease Father   . Lung cancer Brother   . Lupus Sister     Social History   Socioeconomic History  . Marital status: Married    Spouse name: Not on file  . Number of children: Not on file  . Years of education: Not on file  . Highest education level: Not on file  Occupational History  . Not on file  Social Needs  . Financial resource strain: Not on file  . Food insecurity:    Worry: Not on file    Inability: Not on file  . Transportation needs:    Medical: Not on file    Non-medical: Not on file  Tobacco Use  . Smoking status: Former Smoker    Last attempt to quit: 04/28/1985    Years since quitting: 33.0  . Smokeless tobacco: Former Systems developer  Types: Chew  . Tobacco comment: quit 30-40 yrs. aog  Substance and Sexual Activity  . Alcohol use: No    Alcohol/week: 0.0 standard drinks  . Drug use: No  . Sexual activity: Not on file  Lifestyle  . Physical activity:    Days per week: Not on file    Minutes per session: Not on file  . Stress: Not on file  Relationships  . Social connections:    Talks on phone: Not on file    Gets together: Not on file    Attends religious service: Not on file    Active member of club or organization: Not on file    Attends meetings of clubs or organizations: Not on file    Relationship status: Not on file  Other Topics  Concern  . Not on file  Social History Narrative  . Not on file     Review of Systems: A 12 point ROS discussed and pertinent positives are indicated in the HPI above.  All other systems are negative.  Review of Systems  Constitutional: Negative for chills and fever.  Respiratory: Negative for shortness of breath and wheezing.   Cardiovascular: Negative for chest pain and palpitations.  Gastrointestinal: Negative for abdominal pain.  Neurological: Negative for headaches.  Psychiatric/Behavioral: Negative for behavioral problems and confusion.    Vital Signs: BP 139/79   Pulse 94   Temp 98.3 F (36.8 C) (Oral)   Resp 20   Ht 5\' 7"  (1.702 m)   Wt 115 lb 15.4 oz (52.6 kg)   SpO2 99%   BMI 18.16 kg/m   Physical Exam Vitals signs and nursing note reviewed.  Constitutional:      General: He is not in acute distress.    Appearance: Normal appearance.  Cardiovascular:     Rate and Rhythm: Normal rate and regular rhythm.     Heart sounds: Normal heart sounds. No murmur.  Pulmonary:     Effort: Pulmonary effort is normal. No respiratory distress.     Breath sounds: Normal breath sounds. No wheezing.  Skin:    General: Skin is warm and dry.  Neurological:     Mental Status: He is alert and oriented to person, place, and time.  Psychiatric:        Mood and Affect: Mood normal.        Behavior: Behavior normal.        Thought Content: Thought content normal.        Judgment: Judgment normal.      MD Evaluation Airway: WNL Heart: WNL Abdomen: WNL Chest/ Lungs: WNL ASA  Classification: 3 Mallampati/Airway Score: Two   Imaging: Dg Chest Port 1 View  Result Date: 05/17/2018 CLINICAL DATA:  History of prostate cancer, status post resection of a RIGHT neck mass with palliative radiation chemotherapy. Increased lethargy for the past 2 weeks. EXAM: PORTABLE CHEST 1 VIEW COMPARISON:  Chest x-rays dated 05/16/2018 and 03/20/2018. FINDINGS: Heart size and mediastinal  contours are grossly stable in size and configuration. Stable mild atelectasis within the LEFT perihilar lung. No new lung findings. No pleural effusion or pneumothorax seen. Osseous structures about the chest are unremarkable. IMPRESSION: No active disease.  No evidence of pneumonia or pulmonary edema. Electronically Signed   By: Franki Cabot M.D.   On: 05/17/2018 09:04   Dg Chest Port 1 View  Result Date: 05/16/2018 CLINICAL DATA:  Low hemoglobin. Lymphoma. Nausea vomiting. Frequent transfusions. EXAM: PORTABLE CHEST 1 VIEW COMPARISON:  Chest x-ray 03/20/2018,  07/07/2015.  PET-CT 04/25/2017. FINDINGS: Mediastinum hilar structures normal. Left perihilar atelectasis. No focal alveolar infiltrate. Stable mild bilateral pleural thickening consistent scarring. No pneumothorax. Cardiomegaly with normal pulmonary vascularity. Degenerative change thoracic spine. IMPRESSION: 1.  Left perihilar atelectasis. 2.  Cardiomegaly.  No pulmonary venous congestion. Electronically Signed   By: Marcello Moores  Register   On: 05/16/2018 14:41    Labs:  CBC: Recent Labs    05/16/18 1310 05/17/18 0237 05/17/18 1620 05/18/18 0317  WBC 4.2 1.5* 1.6* 1.5*  HGB 3.1* 8.4* 8.7* 8.4*  HCT 10.3* 25.6* 26.3* 26.0*  PLT 56* 33* 30* 28*    COAGS: Recent Labs    03/31/18 0919  INR 1.14    BMP: Recent Labs    03/26/18 1125 05/16/18 1131 05/17/18 0237 05/18/18 0317  NA 142 144 145 141  K 4.4 4.2 3.9 3.7  CL 110 107 114* 112*  CO2 22 18* 21* 21*  GLUCOSE 112* 291* 110* 128*  BUN 36* 60* 61* 44*  CALCIUM 8.6* 8.7* 8.1* 8.0*  CREATININE 1.06 1.32* 1.19 1.17  GFRNONAA >60 52* 59* >60  GFRAA >60 >60 >60 >60    LIVER FUNCTION TESTS: Recent Labs    03/26/18 1125 05/16/18 1131 05/17/18 0237 05/18/18 0317  BILITOT 0.3 0.4 0.7 0.8  AST 75* 17 25 24   ALT 124* 15 17 12   ALKPHOS 71 60 52 50  PROT 5.9* 5.4* 5.1* 5.2*  ALBUMIN 2.5* 2.1* 2.4* 2.3*    TUMOR MARKERS: No results for input(s): AFPTM, CEA, CA199,  CHROMGRNA in the last 8760 hours.  Assessment and Plan:  Pancytopenia. Plan for image-guided bone marrow aspiration/biopsy tentatively for 05/19/2018 pending scheduling with Dr. Pascal Lux. Patient will be NPO at midnight the night prior to procedure. Afebrile. He does not take blood thinners. INR pending.  Risks and benefits discussed with the patient including, but not limited to bleeding, infection, damage to adjacent structures or low yield requiring additional tests. All of the patient's questions were answered, patient is agreeable to proceed. Consent signed and in chart.   Thank you for this interesting consult.  I greatly enjoyed meeting Joel [name] and look forward to participating in their care.  A copy of this report was sent to the requesting provider on this date.  Electronically Signed: Earley Abide, PA-C 05/18/2018, 1:57 PM   I spent a total of 40 Minutes in face to face in clinical consultation, greater than 50% of which was counseling/coordinating care for pancytopenia.

## 2018-05-18 NOTE — Progress Notes (Signed)
RN notified by phlebotomist that patient is refusing labs in preparation for planned biopsy tomorrow.  RN discussed with patient and patient states "I don't want it."  Pt. Daughter is in the room and states "he's recently had two we don't want another."  RN contacted Dr. Lindi Adie updated MD on patients refusal, RN directed to discontinue order for biopsy.

## 2018-05-18 NOTE — Progress Notes (Signed)
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  Patient noted to have a hemoglobin of 3.1 in the ED patient did endorse black stool 3 days prior to admission however none since then.  Patient also noted to be severely pancytopenic.  Patient transfused 4 units of packed red blood cells.  GI consulted.   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  Patient did endorse dark stool about 3 days prior to admission.Patient noted to have a hemoglobin of 8.6 on 05/05/2018.  Initial concern for acute GI bleed however patient denies any ongoing GI bleed apart from 1 dark stool 3 days prior to admission.  Patient transfused 4 units packed red blood cells.  He had 8.4 this morning.  Patient with some symptomatic improvement.  Patient noted to have a large melanotic stool last night at 6 PM on 05/17/2018.  Patient seen in consultation by gastroenterology who feel patient's anemia/pancytopenia likely secondary to fibrosis and involvement of  bone marrow with mantle cell lymphoma and recommending hematology oncology consultation.  Anemia panel was not obtained on admission.  Patient noted to have a normocytic anemia.  LFTs within normal limits.  Bilirubin within normal limits.  LDH of 695.  Peripheral smear per hematology was negative for schistocytes however patient recently transfused prior to peripheral smear being obtained.  Reticulocyte count was 1%, immature reticulocyte fraction was 23.3%.  PSA of 8.1.  Continue PPI twice daily.  Patient seen in consultation by hematology/oncology who are recommending bone marrow biopsy for further evaluation.  Hematology/oncology following.  GI following.  2.  Severe pancytopenia Concern likely secondary to bone marrow invasion/fibrosis from mantle cell lymphoma versus secondary to marrow toxicity from ibrutinib.  Patient has been seen by oncology peripheral smear which was reviewed per oncologist was negative for schistocytes outpatient was recently transfused.  LDH of 695.  Reticulocyte was 1%, immature reticulocyte fraction was 23.3%.  PSA of 8.1.  DAT, IgG was negative, DAT complement negative.  Patient neutropenic with an ANC of 0.9.  Platelet count at 28,000.  Hemoglobin at 8.4 posttransfusion.  Will place on granix until [facility] is greater than 1.  Place on neutropenic precautions.  Hematology/oncology following.  3.  Castration sensitive prostate cancer with lymphadenopathy Patient receiving Lupron injections every 4 months.  Next injection scheduled for February 2020.  Per oncology.  4.  Stage III mantle cell lymphoma diagnosed in December 2018 Per patient's oncologist note from 05/05/2018 patient currently off therapy at this time after completing a course of ibrutinib with complete response.  Treatment discontinued secondary to pancytopenia.  Oncology following.   5.  Severe protein calorie malnutrition Secondary to problems #3 and 4.  Continue Ensure.  Nutritional supplementation.  6.   Hypothermia Questionable etiology.  Chest x-ray on admission and on 05/17/2018 unremarkable.  Urinalysis with large leukocytes, nitrite negative, 21-50 WBCs.  Urine cultures pending.  Blood cultures pending.  Due to patient's pancytopenia and neutropenia will place empirically on IV Rocephin to treat UTI empirically pending urine cultures.  Hypothermia improved.  Follow.  7.  Probable UTI Urinalysis concerning for possible UTI.  Patient pancytopenic and also neutropenic with an ANC of 0.9.  Urine cultures pending.  Due to immunocompromised state we will treat empirically with IV Rocephin pending urine cultures.  Follow.   DVT prophylaxis: SCDs Code Status: Full Family Communication: Updated patient.  No family at bedside. Disposition Plan: Remain in stepdown unit.   Consultants:   Oncology/hematology: Dr. Lindi Adie 05/17/2018  Gastroenterology: Dr. Silverio Decamp 05/16/2018  Procedures:   CXR, 05/16/2018,05/17/2018  Transfused 4 units packed red blood cells 05/16/2018-05/17/2018  Antimicrobials:   IV Rocephin 05/18/2018   Subjective: Patient laying in bed getting cleaned up.  Patient noted to have a large melanotic stool last night at 6 PM.  Patient denies any further bowel movements since last night at 6 PM.  Feeling better.  Denies any chest pain.  Denies any shortness of breath.  No nausea or vomiting.  Tolerating oral intake.   Objective: Vitals:   05/18/18 0600 05/18/18 0700 05/18/18 0800 05/18/18 0900  BP: (!) 158/80 (!) 141/95 136/80 131/73  Pulse: 71 85 72 84  Resp: 16 18 18    Temp:   98.4 F (36.9 C)   TempSrc:   Oral   SpO2: 99% 99% 99% 100%  Weight:      Height:        Intake/Output Summary (Last 24 hours) at 05/18/2018 0910 Last data filed at 05/18/2018 0900 Gross per 24 hour  Intake 557.77 ml  Output 1200 ml  Net -642.23 ml   Filed Weights   05/16/18 1722  Weight: 52.6 kg    Examination:  General exam: NAD.  Cachectic.  Frail.  Chronically  ill-appearing. Respiratory system: CTAB.  No wheezes, no crackles, no rhonchi.  Normal respiratory effort.  Speaking in full sentences.   Cardiovascular system: Regular rate rhythm no murmurs rubs or gallops.  No JVD.  No lower extremity edema.   Gastrointestinal system: Abdomen is soft, nontender, nondistended, positive bowel sounds.  No rebound.  No guarding.  Central nervous system: Alert and oriented. No focal neurological deficits. Extremities: Symmetric 5 x 5 power. Skin: No rashes, lesions or ulcers Psychiatry: Judgement and insight appear normal. Mood & affect appropriate.     Data Reviewed: I have personally reviewed following labs and imaging studies  CBC: Recent Labs  Lab 05/16/18 1131 05/16/18 1310 05/17/18 0237 05/17/18 1620 05/18/18 0317  WBC 2.7* 4.2 1.5* 1.6* 1.5*  NEUTROABS 1.2*  --  0.8* 0.9* 0.9*  HGB 2.8* 3.1* 8.4* 8.7* 8.4*  HCT 9.2* 10.3* 25.6* 26.3* 26.0*  MCV 91.1 90.4 85.9 85.1 85.5  PLT 52* 56* 33* 30* 28*   Basic Metabolic Panel: Recent Labs  Lab 05/16/18 1131 05/17/18 0237 05/18/18 0317  NA 144 145 141  K 4.2 3.9 3.7  CL 107 114* 112*  CO2 18* 21* 21*  GLUCOSE 291* 110* 128*  BUN 60* 61* 44*  CREATININE 1.32* 1.19 1.17  CALCIUM 8.7* 8.1* 8.0*   GFR: Estimated Creatinine Clearance: 40.6 mL/min (by C-G formula  based on SCr of 1.17 mg/dL). Liver Function Tests: Recent Labs  Lab 05/16/18 1131 05/17/18 0237 05/18/18 0317  AST 17 25 24   ALT 15 17 12   ALKPHOS 60 52 50  BILITOT 0.4 0.7 0.8  PROT 5.4* 5.1* 5.2*  ALBUMIN 2.1* 2.4* 2.3*   No results for input(s): LIPASE, AMYLASE in the last 168 hours. No results for input(s): AMMONIA in the last 168 hours. Coagulation Profile: No results for input(s): INR, PROTIME in the last 168 hours. Cardiac Enzymes: No results for input(s): CKTOTAL, CKMB, CKMBINDEX, TROPONINI in the last 168 hours. BNP (last 3 results) No results for input(s): PROBNP in the last 8760 hours. HbA1C: No results for  input(s): HGBA1C in the last 72 hours. CBG: Recent Labs  Lab 05/16/18 1807  GLUCAP 96   Lipid Profile: No results for input(s): CHOL, HDL, LDLCALC, TRIG, CHOLHDL, LDLDIRECT in the last 72 hours. Thyroid Function Tests: Recent Labs    05/17/18 0913  TSH 1.990   Anemia Panel: Recent Labs    05/17/18 0913  RETICCTPCT 1.0   Sepsis Labs: No results for input(s): PROCALCITON, LATICACIDVEN in the last 168 hours.  Recent Results (from the past 240 hour(s))  MRSA PCR Screening     Status: None   Collection Time: 05/16/18  5:35 PM  Result Value Ref Range Status   MRSA by PCR NEGATIVE NEGATIVE Final    Comment:        The GeneXpert MRSA Assay (FDA approved for NASAL specimens only), is one component of a comprehensive MRSA colonization surveillance program. It is not intended to diagnose MRSA infection nor to guide or monitor treatment for MRSA infections. Performed at [facility], [place] [address], [place], [place] [address]          Radiology Studies: Dg Chest [facility] 1 View  Result Date: 05/17/2018 CLINICAL DATA:  History of prostate cancer, status post resection of a RIGHT neck mass with palliative radiation chemotherapy. Increased lethargy for the past 2 weeks. EXAM: PORTABLE CHEST 1 VIEW COMPARISON:  Chest x-rays dated 05/16/2018 and 03/20/2018. FINDINGS: Heart size and mediastinal contours are grossly stable in size and configuration. Stable mild atelectasis within the LEFT perihilar lung. No new lung findings. No pleural effusion or pneumothorax seen. Osseous structures about the chest are unremarkable. IMPRESSION: No active disease.  No evidence of pneumonia or pulmonary edema. Electronically Signed   By: Franki Cabot M.D.   On: 05/17/2018 09:04   Dg Chest Port 1 View  Result Date: 05/16/2018 CLINICAL DATA:  Low hemoglobin. Lymphoma. Nausea vomiting. Frequent transfusions. EXAM: PORTABLE CHEST 1 VIEW COMPARISON:  Chest x-ray 03/20/2018, 07/07/2015.   PET-CT 04/25/2017. FINDINGS: Mediastinum hilar structures normal. Left perihilar atelectasis. No focal alveolar infiltrate. Stable mild bilateral pleural thickening consistent scarring. No pneumothorax. Cardiomegaly with normal pulmonary vascularity. Degenerative change thoracic spine. IMPRESSION: 1.  Left perihilar atelectasis. 2.  Cardiomegaly.  No pulmonary venous congestion. Electronically Signed   By: Marcello Moores  Register   On: 05/16/2018 14:41        Scheduled Meds: . sodium chloride   Intravenous Once  . feeding supplement (ENSURE ENLIVE)  237 mL Oral BID BM  . mouth rinse  15 mL Mouth Rinse BID  . pantoprazole  40 mg Oral BID AC  . Tbo-filgastrim (GRANIX) SQ  480 mcg Subcutaneous q1800   Continuous Infusions: . sodium chloride    . cefTRIAXone (ROCEPHIN)  IV 200 mL/hr at 05/18/18 0900     LOS: 0 days  Time spent: 40 mins    Irine Seal, MD [facility]  If 7PM-7AM, please contact night-coverage [email or web address] Password [email or web address] 05/18/2018, 9:10 AM

## 2018-05-18 NOTE — Progress Notes (Signed)
Initial Nutrition Assessment  DOCUMENTATION CODES:   Severe malnutrition in context of chronic illness, Underweight  INTERVENTION:   Continue Ensure Enlive po BID, each supplement provides 350 kcal and 20 grams of protein  NUTRITION DIAGNOSIS:   Severe Malnutrition related to chronic illness, cancer and cancer related treatments as evidenced by percent weight loss, energy intake < or equal to 75% for > or equal to 1 month, moderate fat depletion, severe muscle depletion.  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   Patient reports not eating well recently d/t increased lethargy and nausea. Pt currently not receiving any treatment, last cancer treatment was put on hold in October 2019. Pt is currently tolerating diet and is drinking Ensure supplements with no issue. Per oncology note, pt to have bone marrow biopsy tentatively 1/27.   Per weight records, pt has lost 37 lb since September 2019 (24% wt loss x 4.5 months, significant for time frame).   Labs reviewed. Medications reviewed.  NUTRITION - FOCUSED PHYSICAL EXAM:    Most Recent Value  Orbital Region  Unable to assess  Upper Arm Region  Severe depletion  Thoracic and Lumbar Region  Unable to assess  Buccal Region  Moderate depletion  Temple Region  Severe depletion  Clavicle Bone Region  Severe depletion  Clavicle and Acromion Bone Region  Severe depletion   Scapular Bone Region  Unable to assess  Dorsal Hand  Moderate depletion  Patellar Region  Unable to assess  Anterior Thigh Region  Unable to assess  Posterior Calf Region  Unable to assess  Edema (RD Assessment)  None       Diet Order:   Diet Order            DIET SOFT Room service appropriate? Yes; Fluid consistency: Thin  Diet effective now              EDUCATION NEEDS:   No education needs have been identified at this time  Skin:  Skin Assessment: Reviewed RN Assessment  Last BM:  1/25  Height:   Ht Readings from Last 1 Encounters:  05/16/18 5' 7"  (1.702 m)    Weight:   Wt Readings from Last 1 Encounters:  05/16/18 52.6 kg    Ideal Body Weight:  67.2 kg  BMI:  Body mass index is 18.16 kg/m.  Estimated Nutritional Needs:   Kcal:  1900-2100  Protein:  95-105g  Fluid:  2L/day   Clayton Bibles, MS, RD, LDN [place] [facility] Pager: [phone] After Hours Pager: [phone]

## 2018-05-19 ENCOUNTER — Telehealth: Payer: Self-pay

## 2018-05-19 DIAGNOSIS — D696 Thrombocytopenia, unspecified: Secondary | ICD-10-CM | POA: Diagnosis present

## 2018-05-19 DIAGNOSIS — Z9079 Acquired absence of other genital organ(s): Secondary | ICD-10-CM | POA: Diagnosis not present

## 2018-05-19 DIAGNOSIS — R680 Hypothermia, not associated with low environmental temperature: Secondary | ICD-10-CM | POA: Diagnosis present

## 2018-05-19 DIAGNOSIS — R509 Fever, unspecified: Secondary | ICD-10-CM | POA: Diagnosis not present

## 2018-05-19 DIAGNOSIS — K2970 Gastritis, unspecified, without bleeding: Secondary | ICD-10-CM | POA: Diagnosis not present

## 2018-05-19 DIAGNOSIS — Z8701 Personal history of pneumonia (recurrent): Secondary | ICD-10-CM | POA: Diagnosis not present

## 2018-05-19 DIAGNOSIS — K921 Melena: Secondary | ICD-10-CM

## 2018-05-19 DIAGNOSIS — K2941 Chronic atrophic gastritis with bleeding: Secondary | ICD-10-CM | POA: Diagnosis present

## 2018-05-19 DIAGNOSIS — K31811 Angiodysplasia of stomach and duodenum with bleeding: Secondary | ICD-10-CM | POA: Diagnosis present

## 2018-05-19 DIAGNOSIS — K2990 Gastroduodenitis, unspecified, without bleeding: Secondary | ICD-10-CM | POA: Diagnosis not present

## 2018-05-19 DIAGNOSIS — D61818 Other pancytopenia: Secondary | ICD-10-CM | POA: Diagnosis present

## 2018-05-19 DIAGNOSIS — K2981 Duodenitis with bleeding: Secondary | ICD-10-CM | POA: Diagnosis present

## 2018-05-19 DIAGNOSIS — B961 Klebsiella pneumoniae [K. pneumoniae] as the cause of diseases classified elsewhere: Secondary | ICD-10-CM

## 2018-05-19 DIAGNOSIS — I10 Essential (primary) hypertension: Secondary | ICD-10-CM | POA: Diagnosis present

## 2018-05-19 DIAGNOSIS — Z923 Personal history of irradiation: Secondary | ICD-10-CM | POA: Diagnosis not present

## 2018-05-19 DIAGNOSIS — D62 Acute posthemorrhagic anemia: Secondary | ICD-10-CM | POA: Diagnosis present

## 2018-05-19 DIAGNOSIS — J150 Pneumonia due to Klebsiella pneumoniae: Secondary | ICD-10-CM | POA: Diagnosis present

## 2018-05-19 DIAGNOSIS — N390 Urinary tract infection, site not specified: Secondary | ICD-10-CM | POA: Diagnosis present

## 2018-05-19 DIAGNOSIS — Z8249 Family history of ischemic heart disease and other diseases of the circulatory system: Secondary | ICD-10-CM | POA: Diagnosis not present

## 2018-05-19 DIAGNOSIS — R64 Cachexia: Secondary | ICD-10-CM | POA: Diagnosis present

## 2018-05-19 DIAGNOSIS — Z681 Body mass index (BMI) 19 or less, adult: Secondary | ICD-10-CM | POA: Diagnosis not present

## 2018-05-19 DIAGNOSIS — D649 Anemia, unspecified: Secondary | ICD-10-CM | POA: Diagnosis not present

## 2018-05-19 DIAGNOSIS — T68XXXD Hypothermia, subsequent encounter: Secondary | ICD-10-CM | POA: Diagnosis not present

## 2018-05-19 DIAGNOSIS — Z87891 Personal history of nicotine dependence: Secondary | ICD-10-CM | POA: Diagnosis not present

## 2018-05-19 DIAGNOSIS — C61 Malignant neoplasm of prostate: Secondary | ICD-10-CM | POA: Diagnosis present

## 2018-05-19 DIAGNOSIS — E43 Unspecified severe protein-calorie malnutrition: Secondary | ICD-10-CM | POA: Diagnosis present

## 2018-05-19 DIAGNOSIS — K31819 Angiodysplasia of stomach and duodenum without bleeding: Secondary | ICD-10-CM | POA: Diagnosis not present

## 2018-05-19 DIAGNOSIS — C8310 Mantle cell lymphoma, unspecified site: Secondary | ICD-10-CM | POA: Diagnosis not present

## 2018-05-19 DIAGNOSIS — B9689 Other specified bacterial agents as the cause of diseases classified elsewhere: Secondary | ICD-10-CM | POA: Diagnosis present

## 2018-05-19 DIAGNOSIS — Z801 Family history of malignant neoplasm of trachea, bronchus and lung: Secondary | ICD-10-CM | POA: Diagnosis not present

## 2018-05-19 DIAGNOSIS — C8311 Mantle cell lymphoma, lymph nodes of head, face, and neck: Secondary | ICD-10-CM | POA: Diagnosis present

## 2018-05-19 DIAGNOSIS — E876 Hypokalemia: Secondary | ICD-10-CM | POA: Diagnosis not present

## 2018-05-19 LAB — CBC WITH DIFFERENTIAL/PLATELET
Abs Immature Granulocytes: 0.13 10*3/uL — ABNORMAL HIGH (ref 0.00–0.07)
Abs Immature Granulocytes: 0.19 10*3/uL — ABNORMAL HIGH (ref 0.00–0.07)
BASOS ABS: 0 10*3/uL (ref 0.0–0.1)
Basophils Absolute: 0 10*3/uL (ref 0.0–0.1)
Basophils Relative: 1 %
Basophils Relative: 1 %
EOS PCT: 1 %
Eosinophils Absolute: 0 10*3/uL (ref 0.0–0.5)
Eosinophils Absolute: 0 10*3/uL (ref 0.0–0.5)
Eosinophils Relative: 0 %
HCT: 26 % — ABNORMAL LOW (ref 39.0–52.0)
HCT: 26.6 % — ABNORMAL LOW (ref 39.0–52.0)
Hemoglobin: 8.4 g/dL — ABNORMAL LOW (ref 13.0–17.0)
Hemoglobin: 8.6 g/dL — ABNORMAL LOW (ref 13.0–17.0)
IMMATURE GRANULOCYTES: 11 %
Immature Granulocytes: 9 %
Lymphocytes Relative: 23 %
Lymphocytes Relative: 22 %
Lymphs Abs: 0.4 10*3/uL — ABNORMAL LOW (ref 0.7–4.0)
Lymphs Abs: 0.4 10*3/uL — ABNORMAL LOW (ref 0.7–4.0)
MCH: 27.6 pg (ref 26.0–34.0)
MCH: 27.8 pg (ref 26.0–34.0)
MCHC: 32.3 g/dL (ref 30.0–36.0)
MCHC: 32.3 g/dL (ref 30.0–36.0)
MCV: 85.5 fL (ref 80.0–100.0)
MCV: 86.1 fL (ref 80.0–100.0)
Monocytes Absolute: 0.1 10*3/uL (ref 0.1–1.0)
Monocytes Absolute: 0.2 10*3/uL (ref 0.1–1.0)
Monocytes Relative: 8 %
Monocytes Relative: 10 %
NEUTROS ABS: 0.9 10*3/uL — AB (ref 1.7–7.7)
NEUTROS PCT: 56 %
Neutro Abs: 0.9 10*3/uL — ABNORMAL LOW (ref 1.7–7.7)
Neutrophils Relative %: 58 %
Platelets: 28 10*3/uL — CL (ref 150–400)
Platelets: 24 10*3/uL — CL (ref 150–400)
RBC: 3.04 MIL/uL — ABNORMAL LOW (ref 4.22–5.81)
RBC: 3.09 MIL/uL — ABNORMAL LOW (ref 4.22–5.81)
RDW: 14.7 % (ref 11.5–15.5)
RDW: 14.9 % (ref 11.5–15.5)
WBC: 1.5 10*3/uL — ABNORMAL LOW (ref 4.0–10.5)
WBC: 1.7 10*3/uL — ABNORMAL LOW (ref 4.0–10.5)
nRBC: 8.7 % — ABNORMAL HIGH (ref 0.0–0.2)
nRBC: 6.5 % — ABNORMAL HIGH (ref 0.0–0.2)

## 2018-05-19 LAB — COMPREHENSIVE METABOLIC PANEL
ALT: 15 U/L (ref 0–44)
ANION GAP: 8 (ref 5–15)
AST: 17 U/L (ref 15–41)
Albumin: 2.3 g/dL — ABNORMAL LOW (ref 3.5–5.0)
Alkaline Phosphatase: 46 U/L (ref 38–126)
BUN: 33 mg/dL — ABNORMAL HIGH (ref 8–23)
CO2: 23 mmol/L (ref 22–32)
Calcium: 8.4 mg/dL — ABNORMAL LOW (ref 8.9–10.3)
Chloride: 113 mmol/L — ABNORMAL HIGH (ref 98–111)
Creatinine, Ser: 1.22 mg/dL (ref 0.61–1.24)
GFR calc Af Amer: >60 mL/min (ref >60)
GFR calc non Af Amer: 58 mL/min — ABNORMAL LOW (ref >60)
Glucose, Bld: 114 mg/dL — ABNORMAL HIGH (ref 70–99)
Potassium: 3.3 mmol/L — ABNORMAL LOW (ref 3.5–5.1)
Sodium: 144 mmol/L (ref 135–145)
Total Bilirubin: 0.5 mg/dL (ref 0.3–1.2)
Total Protein: 5.3 g/dL — ABNORMAL LOW (ref 6.5–8.1)

## 2018-05-19 MED ORDER — POTASSIUM CHLORIDE CRYS ER 20 MEQ PO TBCR
40.0000 meq | EXTENDED_RELEASE_TABLET | Freq: Once | ORAL | Status: AC
  Administered 2018-05-19: 40 meq via ORAL
  Filled 2018-05-19: qty 2

## 2018-05-19 MED ORDER — SENNOSIDES-DOCUSATE SODIUM 8.6-50 MG PO TABS
1.0000 | ORAL_TABLET | Freq: Two times a day (BID) | ORAL | Status: DC
  Administered 2018-05-19 – 2018-05-27 (×6): 1 via ORAL
  Filled 2018-05-19 (×11): qty 1

## 2018-05-19 NOTE — Progress Notes (Signed)
     [facility] Progress Note   Chief Complaint:   anemia    SUBJECTIVE:    feels fine. Normal colored stools. Just transferred from ICU.    ASSESSMENT AND PLAN:   1. 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 He has been getting palliative radiation. Off chemo due to pancytopenia.   2. Profound anemia. Admitted from ED 05/16/18 with profound anemia , Hgb 2.8 g , down from 8.6 mid Jan in absence of NSAID use. Had black stool x 2 days just prior to admission.  Hem/Onc evaluated, recommended BM bx to check for Mantle Cell lymphoma involvement with bone marrow. Biopsey was supposed to be done today but patient refused pre procedure labs so procedure cancelled.  -normal colored stools now. Hgb stable at 8.6 post 4 u pRBC since admission.  -in absence of ongoing bleeding we are not planning endoscopic evaluation. If overt bleeding again then will likely proceed with EGD but may require platelet tranfusion -BID PPI   OBJECTIVE:     Vital signs in last 24 hours: Temp:  [97.6 F (36.4 C)-99.3 F (37.4 C)] 97.6 F (36.4 C) (01/27 0800) Pulse Rate:  [70-97] 77 (01/27 0900) Resp:  [10-23] 17 (01/27 0900) BP: (119-185)/(70-106) 139/84 (01/27 0900) SpO2:  [98 %-100 %] 99 % (01/27 0900) Last BM Date: (PTA\) General:   Alert, thin male in NAD EENT:  Normal hearing, non icteric sclera, conjunctive pink.  Heart:  Regular rate and rhythm.  No lower extremity edema   Pulm: Normal respiratory effort Abdomen:  Soft, nondistended, nontender.  Normal bowel sounds, no masses felt.       Neurologic:  Alert and  oriented x4;  grossly normal neurologically. Psych:  Pleasant, cooperative.  Normal mood and affect.   Intake/Output from previous day: 01/26 0701 - 01/27 0700 In: 340.1 [P.O.:240; IV Piggyback:100.1] Out: 500 [Urine:500] Intake/Output this shift: Total I/O In: 340 [P.O.:240; IV Piggyback:100] Out: 250 [Urine:250]  Lab  Results: Recent Labs    05/17/18 1620 05/18/18 0317 05/19/18 0259  WBC 1.6* 1.5* 1.7*  HGB 8.7* 8.4* 8.6*  HCT 26.3* 26.0* 26.6*  PLT 30* 28* 24*   BMET Recent Labs    05/17/18 0237 05/18/18 0317 05/19/18 0259  NA 145 141 144  K 3.9 3.7 3.3*  CL 114* 112* 113*  CO2 21* 21* 23  GLUCOSE 110* 128* 114*  BUN 61* 44* 33*  CREATININE 1.19 1.17 1.22  CALCIUM 8.1* 8.0* 8.4*   LFT Recent Labs    05/19/18 0259  PROT 5.3*  ALBUMIN 2.3*  AST 17  ALT 15  ALKPHOS 8  BILITOT 0.5    Principal Problem:   Gastrointestinal hemorrhage with melena Active Problems:   Protein-calorie malnutrition, severe (HCC)   Mantle cell lymphoma of lymph nodes of head, face, and neck (HCC)   Prostate cancer (HCC)   Pancytopenia (HCC)   Symptomatic anemia   Hypothermia   Thrombocytopenia (HCC)   UTI due to Klebsiella species     LOS: 0 days   Tye Savoy ,NP 05/19/2018, 1:26 PM

## 2018-05-19 NOTE — Progress Notes (Signed)
IP PROGRESS NOTE  Subjective:   Events noted in the last few days.  [name] was hospitalized with severe anemia and reported melanotic stool.  Hemoglobin was 2.8 and received 4 units of packed red cell transfusion with hemoglobin improving to 8.6 at this time.  He is no longer reporting any bleeding issues at this time.  He denies any hematochezia, hemoptysis or hematemesis.  Still weak but but much improved.  Objective:  Vital signs in last 24 hours: Temp:  [98.1 F (36.7 C)-99.3 F (37.4 C)] 98.1 F (36.7 C) (01/27 0347) Pulse Rate:  [72-97] 73 (01/27 0600) Resp:  [12-23] 13 (01/27 0600) BP: (110-185)/(64-106) 119/70 (01/27 0600) SpO2:  [98 %-100 %] 99 % (01/27 0600) Weight change:  Last BM Date: (PTA\)  Intake/Output from previous day: 01/26 0701 - 01/27 0700 In: 340.1 [P.O.:240; IV Piggyback:100.1] Out: 500 [Urine:500] General: Alert, awake without distress. Head: Normocephalic atraumatic. Mouth: mucous membranes moist, pharynx normal without lesions Eyes: No scleral icterus.  Pupils are equal and round reactive to light. Resp: clear to auscultation bilaterally without rhonchi or wheezes or dullness to percussion. Cardio: regular rate and rhythm, S1, S2 normal, no murmur, click, rub or gallop GI: soft, non-tender; bowel sounds normal; no masses,  no organomegaly Musculoskeletal: No joint deformity or effusion. Neurological: No motor, sensory deficits.  Intact deep tendon reflexes. Skin: No rashes or lesions.    Lab Results: Recent Labs    05/18/18 0317 05/19/18 0259  WBC 1.5* 1.7*  HGB 8.4* 8.6*  HCT 26.0* 26.6*  PLT 28* 24*    BMET Recent Labs    05/18/18 0317 05/19/18 0259  NA 141 144  K 3.7 3.3*  CL 112* 113*  CO2 21* 23  GLUCOSE 128* 114*  BUN 44* 33*  CREATININE 1.17 1.22  CALCIUM 8.0* 8.4*    Studies/Results: Dg Chest Port 1 View  Result Date: 05/17/2018 CLINICAL DATA:  History of prostate cancer, status post resection of a RIGHT neck  mass with palliative radiation chemotherapy. Increased lethargy for the past 2 weeks. EXAM: PORTABLE CHEST 1 VIEW COMPARISON:  Chest x-rays dated 05/16/2018 and 03/20/2018. FINDINGS: Heart size and mediastinal contours are grossly stable in size and configuration. Stable mild atelectasis within the LEFT perihilar lung. No new lung findings. No pleural effusion or pneumothorax seen. Osseous structures about the chest are unremarkable. IMPRESSION: No active disease.  No evidence of pneumonia or pulmonary edema. Electronically Signed   By: Franki Cabot M.D.   On: 05/17/2018 09:04    Medications: I have reviewed the patient's current medications.  Assessment/Plan:  78 year old man with:  1. Mantle cell lymphoma diagnosed in December 2018 after presenting with cervical adenopathy   He has been on ibrutinib in the past with therapy discontinued because of pancytopenia and radiographic response by imaging studies.  His bone marrow biopsy in December 2019 showed an element of mantle cell lymphoma but currently not on active treatment.  His treatment will be deferred for the time being given his severe pancytopenia.   2.  Pancytopenia: Etiology likely related to mantle cell lymphoma infiltration of the bone marrow and element of fibrosis as noted by his bone marrow biopsy in December 2019.  I recommend continued supportive care at this time.  I see little value to repeating a bone marrow biopsy that was done less than 2 months ago.  We will hold off on a bone marrow biopsy at this time.  No need for platelet transfusion at this time.  2.    Prostate cancer: His PSA is up to 8.  Bone marrow biopsy in December 2019 did not show any evidence of prostate cancer infiltration in the bone marrow.  3. Anemia:  His hemoglobin is adequate and does not require any further transfusion.  His hemoglobin drop is still puzzling but could be an element of GI bleeding on top of his marrow failure.    4.   Prognosis: Overall poor and fortunately.  He has 2 incurable malignancy associated with bone marrow failure related to his lymphoma and lymphoma treatment.   We will continue to follow with you while hospitalized.  25 minutes was spent with the patient face-to-face today.  More than 50% of time was dedicated to discussing laboratory data, indication for bone marrow biopsy and answering questions regarding future plan of care.     LOS: 0 days   Zola Button 05/19/2018, 7:45 AM

## 2018-05-19 NOTE — Progress Notes (Signed)
Physical Therapy note- Patient will benefit from HHPT.[place] Pager [phone] Office [phone]

## 2018-05-19 NOTE — Evaluation (Signed)
Occupational Therapy Evaluation Patient Details Name: Joel [name] MRN: [id number] DOB: August 11, 1940 Today's Date: 05/19/2018    History of Present Illness Pt is a 78 y.o. M with significant PMH of stroke, mantle cell lymphoma, prostate CA, anemia, CKD-4, who presents with critical Hgb 2.9. and generalized weakness.    Clinical Impression   Pt admitted with above diagnoses, with generalized weakness and decreased activity tolerance limiting ability to engage in functional BADL at desired level of ind. Daughter and wife present for evaluation. Pt presenting with some decreased cognition in the areas of recall of recent events, family frequently correcting pt responses. Pt shares he has a shower chair, but it does not fit in the tub. Offered education to pt and family on how shower chair can appropriately be put in tub. Pt able to complete functional mobility to BR without DME at min guard A, but noted pt reaching for external support on wall, bed, etc. Pt is currently min guard for mobility and t/fs from bed, but requiring min A to t/f from toilet when not using grab bars. Recommend pt use BSC over toilet to increase ind in toilet t/f in the home. At this time recommend HHOT to increase functional engagement and safety in BADL in the home environment. Will continue to follow acutely.      Follow Up Recommendations  Home health OT    Equipment Recommendations  3 in 1 bedside commode    Recommendations for Other Services       Precautions / Restrictions Precautions Precautions: Fall Restrictions Weight Bearing Restrictions: No      Mobility Bed Mobility Overal bed mobility: Needs Assistance Bed Mobility: Supine to Sit     Supine to sit: Min guard;HOB elevated        Transfers Overall transfer level: Needs assistance Equipment used: Rolling walker (2 wheeled) Transfers: Sit to/from Stand Sit to Stand: Min guard         General transfer comment: close guard for safety     Balance Overall balance assessment: Needs assistance Sitting-balance support: No upper extremity supported Sitting balance-Leahy Scale: Good     Standing balance support: During functional activity;No upper extremity supported Standing balance-Leahy Scale: Fair Standing balance comment: reaching for external objects in environment without DME                           ADL either performed or assessed with clinical judgement   ADL Overall ADL's : Needs assistance/impaired Eating/Feeding: Set up;Sitting   Grooming: Min guard;Standing;Wash/dry hands   Upper Body Bathing: Minimal assistance;Sitting   Lower Body Bathing: Minimal assistance;Sit to/from stand   Upper Body Dressing : Minimal assistance;Sitting   Lower Body Dressing: Minimal assistance;Sit to/from stand Lower Body Dressing Details (indicate cue type and reason): to don socks seated EOB; cues to cross legs, only able to on one side; increased time Toilet Transfer: Minimal assistance;Regular Toilet;Grab bars Toilet Transfer Details (indicate cue type and reason): seated on toilet to urinate, requiring min A for sit> stand without grab bar; min guard with heavy reliance on grab bar Toileting- Clothing Manipulation and Hygiene: Set up;Sit to/from stand   Tub/ Shower Transfer: Minimal assistance;Shower seat   Functional mobility during ADLs: Min guard General ADL Comments: pt with generalized weakness and decreased awareness of deficits; completing functional mobility with no DME but externally reaching for objects. Noted decreased activity tolerance with ADL     Vision Baseline Vision/History: No visual deficits Patient Visual  Report: No change from baseline Additional Comments: pt did report thinking the floor was wet in bathroom, difficulty with some glares     Perception     Praxis      Pertinent Vitals/Pain Pain Assessment: No/denies pain     Hand Dominance     Extremity/Trunk Assessment Upper  Extremity Assessment Upper Extremity Assessment: Generalized weakness   Lower Extremity Assessment Lower Extremity Assessment: Defer to PT evaluation       Communication Communication Communication: No difficulties   Cognition Arousal/Alertness: Awake/alert Behavior During Therapy: WFL for tasks assessed/performed Overall Cognitive Status: Impaired/Different from baseline Area of Impairment: Memory;Safety/judgement                     Memory: Decreased short-term memory   Safety/Judgement: Decreased awareness of safety;Decreased awareness of deficits     General Comments: pt with poor recall of home set up and DME without dtr and wife assist   General Comments       Exercises     Shoulder Instructions      Home Living Family/patient expects to be discharged to:: Private residence Living Arrangements: Spouse/significant other;Children(dtr) Available Help at Discharge: Family;Available 24 hours/day Type of Home: House Home Access: Stairs to enter [employer] of Steps: 3 Entrance Stairs-Rails: None Home Layout: One level     Bathroom Shower/Tub: Teacher, early years/pre: Standard     Home Equipment: Shower seat          Prior Functioning/Environment Level of Independence: Independent                 OT Problem List: Decreased strength;Decreased activity tolerance;Decreased knowledge of use of DME or AE;Impaired balance (sitting and/or standing);Decreased safety awareness      OT Treatment/Interventions: Self-care/ADL training;DME and/or AE instruction;Therapeutic activities;Balance training;Therapeutic exercise;Energy conservation;Patient/family education    OT Goals(Current goals can be found in the care plan section) Acute Rehab OT Goals Patient Stated Goal: to go home OT Goal Formulation: With patient Time For Goal Achievement: 06/02/18 Potential to Achieve Goals: Good  OT Frequency: Min 2X/week   Barriers to D/C:             Co-evaluation              AM-PAC OT "6 Clicks" Daily Activity     Outcome Measure Help from another person eating meals?: None Help from another person taking care of personal grooming?: None Help from another person toileting, which includes using toliet, bedpan, or urinal?: A Little(without grab bars) Help from another person bathing (including washing, rinsing, drying)?: A Little Help from another person to put on and taking off regular upper body clothing?: A Little Help from another person to put on and taking off regular lower body clothing?: A Little 6 Click Score: 20   End of Session Equipment Utilized During Treatment: Gait belt Nurse Communication: Mobility status  Activity Tolerance: Patient tolerated treatment well Patient left: in chair;with call bell/phone within reach;with family/visitor present  OT Visit Diagnosis: Muscle weakness (generalized) (M62.81)                Time: 2952-8413 OT Time Calculation (min): 31 min Charges:  OT General Charges $OT Visit: 1 Visit OT Evaluation $OT Eval Moderate Complexity: 1 Mod OT Treatments $Self Care/Home Management : 8-22 mins  Zenovia Jarred, MSOT, OTR/L Behavioral Health OT/ Acute Relief OT [facility] Office: [phone]   Zenovia Jarred 05/19/2018, 5:43 PM

## 2018-05-19 NOTE — Progress Notes (Addendum)
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  Patient noted to have a hemoglobin of 3.1 in the ED patient did endorse black stool 3 days prior to admission however none since then.  Patient also noted to be severely pancytopenic.  Patient transfused 4 units of packed red blood cells.  GI consulted.   Assessment & Plan:   Principal Problem:   Gastrointestinal hemorrhage with melena Active Problems:   Protein-calorie malnutrition, severe (HCC)   Mantle cell lymphoma of lymph nodes of head, face, and neck (HCC)   Prostate cancer (HCC)   Pancytopenia (HCC)   Symptomatic anemia   Hypothermia   Thrombocytopenia (HCC)   UTI due to Klebsiella species  1 symptomatic anemia versus GI hemorrhage with melena Patient presenting with fatigue, generalized weakness noted to have a severe anemia globin of 2.8 noted and a cancer center and repeat of 3.1 in the ED.  Patient did endorse dark stool about 3 days prior to admission.Patient noted to have a hemoglobin of 8.6 on 05/05/2018.  Initial concern for acute GI bleed however patient denies any ongoing GI bleed apart from 1 dark stool 3 days prior to admission.  Patient transfused 4 units packed red blood cells.  Hemoglobin this morning is 8.6.  Patient with symptomatic improvement.  Patient noted to have a large melanotic stool last night at 6 PM on 05/17/2018.  Patient seen in consultation by gastroenterology who feel patient's anemia/pancytopenia likely secondary to fibrosis  and involvement of bone marrow with mantle cell lymphoma and recommending hematology oncology consultation.  Anemia panel was not obtained on admission.  Patient noted to have a normocytic anemia.  LFTs within normal limits.  Bilirubin within normal limits.  LDH of 695.  Peripheral smear per hematology was negative for schistocytes however patient recently transfused prior to peripheral smear being obtained.  Reticulocyte count was 1%, immature reticulocyte fraction was 23.3%.  PSA of 8.1.  Continue PPI twice daily.  Patient seen in consultation by hematology/oncology who initially were recommending bone marrow biopsy however patient has been seen by his oncologist this morning, Dr. Alen Blew who is recommending to hold off on bone marrow biopsy at this time as well was done less than 2 months ago, no need for platelet transfusion at this time, supportive care. Hematology/oncology following.  GI following.  2.  Severe pancytopenia Concern likely secondary to bone marrow invasion/fibrosis from mantle cell lymphoma versus secondary to marrow toxicity from ibrutinib.  Patient has been seen by oncology peripheral smear which was reviewed per oncologist was negative for schistocytes outpatient was recently transfused.  LDH of 695.  Reticulocyte was 1%, immature reticulocyte fraction was 23.3%.  PSA of 8.1.  DAT, IgG was negative, DAT complement negative.  Patient neutropenic with an ANC of 0.9.  Platelet count at 28,000.  Hemoglobin at 8.4 posttransfusion.  Continue Granix until [place] is greater than 1.  Continue neutropenic precautions.  Hematology/oncology following.  3.  Castration sensitive prostate cancer with lymphadenopathy Patient receiving Lupron injections every 4 months.  Next injection scheduled for February 2020.  Per oncology.  4.  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  Treatment discontinued secondary to pancytopenia.  Oncology following.   5.  Severe protein calorie malnutrition Secondary to problems #3 and 4.  Continue Ensure.  Nutritional supplementation.  6.  Hypothermia Questionable etiology.  Chest x-ray on admission and on 05/17/2018 unremarkable.  Urinalysis consistent with Klebsiella pneumonia UTI.  Patient currently afebrile.  Blood cultures with no growth today. Due to patient's pancytopenia and neutropenia patient was started empirically on IV Rocephin which we will continue through today likely transition to oral antibiotics tomorrow.   7.  Klebsiella pneumonia UTI Urinalysis concerning for possible UTI.  Patient pancytopenic and also neutropenic with an ANC of 0.9.  Urine cultures consistent with Klebsiella pneumonia greater than 100,000 colonies.  Due to immunocompromised state we will treat empirically with IV Rocephin.  Continue IV antibiotics and likely transition to oral antibiotics tomorrow.  Follow.  8.  Hypokalemia Replete.   DVT prophylaxis: SCDs Code Status: Full Family Communication: Updated patient and wife and daughter at bedside. Disposition Plan: Transfer to [facility].  Likely home in the next 24 to 48 hours if counts remain stable with continued clinical improvement.   Consultants:   Oncology/hematology: Dr. Lindi Adie 05/17/2018  Gastroenterology: Dr. Silverio Decamp 05/16/2018  Procedures:   CXR, 05/16/2018,05/17/2018  Transfused 4 units packed red blood cells 05/16/2018-05/17/2018  Antimicrobials:   IV Rocephin 05/18/2018   Subjective: Patient sitting up in chair about to eat breakfast.  Denies any further bowel movements.  Denies any chest pain.  No shortness of breath.  No nausea or vomiting.  States he is feeling better.   Objective: Vitals:   05/19/18 0400 05/19/18 0500 05/19/18 0600 05/19/18 0800  BP: 138/81 (!) 141/77 119/70   Pulse: 79 75 73   Resp: 14 (!) 21 13   Temp:    97.6 F  (36.4 C)  TempSrc:    Oral  SpO2: 98% 99% 99%   Weight:      Height:        Intake/Output Summary (Last 24 hours) at 05/19/2018 1032 Last data filed at 05/18/2018 2200 Gross per 24 hour  Intake 240 ml  Output 300 ml  Net -60 ml   Filed Weights   05/16/18 1722  Weight: 52.6 kg    Examination:  General exam: NAD.  Cachectic.  Frail.  Chronically ill-appearing. Respiratory system: Lungs clear to auscultation bilaterally.  No wheezes, no crackles, no rhonchi.  Respiratory effort.  Speaking in full sentences.  Cardiovascular system: RRR no murmurs rubs or gallops no JVD.  No lower extremity edema.  Gastrointestinal system: Abdomen is nontender, nondistended, soft, positive bowel sounds.  No rebound.  No guarding.  Central nervous system: Alert and oriented. No focal neurological deficits. Extremities: Symmetric 5 x 5 power. Skin: No rashes, lesions or ulcers Psychiatry: Judgement and insight appear normal. Mood & affect appropriate.     Data Reviewed: I have personally reviewed following labs and imaging studies  CBC: Recent Labs  Lab 05/16/18 1131 05/16/18 1310 05/17/18 0237 05/17/18 1620 05/18/18 0317 05/19/18 0259  WBC 2.7* 4.2 1.5* 1.6* 1.5* 1.7*  NEUTROABS 1.2*  --  0.8* 0.9* 0.9* 0.9*  HGB 2.8* 3.1* 8.4* 8.7* 8.4* 8.6*  HCT 9.2* 10.3* 25.6* 26.3* 26.0* 26.6*  MCV 91.1 90.4 85.9 85.1 85.5 86.1  PLT 52* 56* 33* 30* 28* 24*  Basic Metabolic Panel: Recent Labs  Lab 05/16/18 1131 05/17/18 0237 05/18/18 0317 05/19/18 0259  NA 144 145 141 144  K 4.2 3.9 3.7 3.3*  CL 107 114* 112* 113*  CO2 18* 21* 21* 23  GLUCOSE 291* 110* 128* 114*  BUN 60* 61* 44* 33*  CREATININE 1.32* 1.19 1.17 1.22  CALCIUM 8.7* 8.1* 8.0* 8.4*   GFR: Estimated Creatinine Clearance: 38.9 mL/min (by C-G formula based on SCr of 1.22 mg/dL). Liver Function Tests: Recent Labs  Lab 05/16/18 1131 05/17/18 0237 05/18/18 0317 05/19/18 0259  AST 17 25 24 17   ALT 15 17 12 15   ALKPHOS 60  52 50 46  BILITOT 0.4 0.7 0.8 0.5  PROT 5.4* 5.1* 5.2* 5.3*  ALBUMIN 2.1* 2.4* 2.3* 2.3*   No results for input(s): LIPASE, AMYLASE in the last 168 hours. No results for input(s): AMMONIA in the last 168 hours. Coagulation Profile: No results for input(s): INR, PROTIME in the last 168 hours. Cardiac Enzymes: No results for input(s): CKTOTAL, CKMB, CKMBINDEX, TROPONINI in the last 168 hours. BNP (last 3 results) No results for input(s): PROBNP in the last 8760 hours. HbA1C: No results for input(s): HGBA1C in the last 72 hours. CBG: Recent Labs  Lab 05/16/18 1807  GLUCAP 96   Lipid Profile: No results for input(s): CHOL, HDL, LDLCALC, TRIG, CHOLHDL, LDLDIRECT in the last 72 hours. Thyroid Function Tests: Recent Labs    05/17/18 0913  TSH 1.990   Anemia Panel: Recent Labs    05/17/18 0913  RETICCTPCT 1.0   Sepsis Labs: No results for input(s): PROCALCITON, LATICACIDVEN in the last 168 hours.  Recent Results (from the past 240 hour(s))  MRSA PCR Screening     Status: None   Collection Time: 05/16/18  5:35 PM  Result Value Ref Range Status   MRSA by PCR NEGATIVE NEGATIVE Final    Comment:        The GeneXpert MRSA Assay (FDA approved for NASAL specimens only), is one component of a comprehensive MRSA colonization surveillance program. It is not intended to diagnose MRSA infection nor to guide or monitor treatment for MRSA infections. Performed at [facility], [place] [address], [place], [place] [address]   Culture, Urine     Status: Abnormal (Preliminary result)   Collection Time: 05/17/18  7:58 AM  Result Value Ref Range Status   Specimen Description URINE, RANDOM  Final   Special Requests NONE  Final   Culture (A)  Final    >=100,000 COLONIES/mL KLEBSIELLA PNEUMONIAE CULTURE REINCUBATED FOR BETTER GROWTH    Report Status PENDING  Incomplete   Organism ID, Bacteria KLEBSIELLA PNEUMONIAE (A)  Final      Susceptibility   Klebsiella  pneumoniae - MIC*    AMPICILLIN >=32 RESISTANT Resistant     CEFAZOLIN <=4 SENSITIVE Sensitive     CEFTRIAXONE <=1 SENSITIVE Sensitive     CIPROFLOXACIN <=0.25 SENSITIVE Sensitive     GENTAMICIN <=1 SENSITIVE Sensitive     IMIPENEM <=0.25 SENSITIVE Sensitive     NITROFURANTOIN 32 SENSITIVE Sensitive     TRIMETH/SULFA <=20 SENSITIVE Sensitive     AMPICILLIN/SULBACTAM >=32 RESISTANT Resistant     PIP/TAZO 8 SENSITIVE Sensitive     Extended ESBL Value in next row Sensitive      NEGATIVEPerformed at [facility] [address], [place], [place] [address]    * >=100,000 COLONIES/mL KLEBSIELLA PNEUMONIAE  Culture, blood (Routine X 2) w Reflex to ID Panel  Status: None (Preliminary result)   Collection Time: 05/17/18  9:13 AM  Result Value Ref Range Status   Specimen Description   Final    BLOOD RIGHT HAND Performed at [facility] [address] [place], [place] [address]    Special Requests   Final    BOTTLES DRAWN AEROBIC ONLY Blood Culture adequate volume Performed at [facility] [address] [place], [place] [address]    Culture   Final    NO GROWTH < 24 HOURS Performed at [facility] [address] [place], [place] [address]    Report Status PENDING  Incomplete  Culture, blood (Routine X 2) w Reflex to ID Panel     Status: None (Preliminary result)   Collection Time: 05/17/18  9:13 AM  Result Value Ref Range Status   Specimen Description   Final    BLOOD LEFT HAND Performed at [facility] [address] [place], [place] [address]    Special Requests   Final    BOTTLES DRAWN AEROBIC ONLY Blood Culture adequate volume Performed at [facility] [address], [place], [place] [address]    Culture   Final    NO GROWTH < 24 HOURS Performed at [facility] [address] [place],  [address]    Report Status PENDING  Incomplete         Radiology  Studies: No results found.      Scheduled Meds: . sodium chloride   Intravenous Once  . feeding supplement (ENSURE ENLIVE)  237 mL Oral BID BM  . mouth rinse  15 mL Mouth Rinse BID  . pantoprazole  40 mg Oral BID AC  . senna-docusate  1 tablet Oral BID  . Tbo-filgastrim (GRANIX) SQ  480 mcg Subcutaneous q1800   Continuous Infusions: . sodium chloride    . cefTRIAXone (ROCEPHIN)  IV 1 g (05/19/18 0840)     LOS: 0 days    Time spent: 72 mins    Irine Seal, MD [facility]  If 7PM-7AM, please contact night-coverage [email or web address] Password [facility] 05/19/2018, 10:32 AM

## 2018-05-19 NOTE — Telephone Encounter (Signed)
Received critical Hgb of 2.8 from [facility] lab. Dr. Alen Blew made aware and then contacted ED charge RN Erline Levine at 12:11 for admit, no room available in the ED at this time and informed that this RN will receive return call in 10-15 minutes with room assignment for ED transfer. Made patient and family aware of the time frame, covered patient in warm blanked, and placed on 2L O2 via [place], as he remained in the [facility] lobby at this time. Received return call from [place] at 12:41 with assignment to room 9. Patient transported to ED via wheelchair, with O2, and family, en route patient began to vomit. Report provided to [facility] in the [facility] ED.

## 2018-05-19 NOTE — Evaluation (Signed)
Physical Therapy Evaluation Patient Details Name: Joel [name] MRN: [id number] DOB: 10-21-1940 Today's Date: 05/19/2018   History of Present Illness  Pt is a 78 y.o. M with significant PMH of stroke, mantle cell lymphoma, prostate CA, anemia, CKD-4, who presents with critical Hgb 2.9. and generalized weakness.   Clinical Impression  The patient ambuated x 160 with RW. Patient tolerated well. Should progress to return home with family assisting. Pt admitted with above diagnosis. Pt currently with functional limitations due to the deficits listed below (see PT Problem List).  Pt will benefit from skilled PT to increase their independence and safety with mobility to allow discharge to the venue listed below.   l    Follow Up Recommendations  none    Equipment Recommendations  Rolling walker with 5" wheels(may not require)    Recommendations for Other Services       Precautions / Restrictions Precautions Precautions: Fall      Mobility  Bed Mobility Overal bed mobility: Needs Assistance Bed Mobility: Supine to Sit     Supine to sit: Min guard;HOB elevated     General bed mobility comments: no extra assistance required  Transfers Overall transfer level: Needs assistance Equipment used: Rolling walker (2 wheeled) Transfers: Sit to/from Stand Sit to Stand: Min guard         General transfer comment: safety  min guard  Ambulation/Gait Ambulation/Gait assistance: Min assist Gait Distance (Feet): 150 Feet Assistive device: Rolling walker (2 wheeled) Gait Pattern/deviations: Step-through pattern     General Gait Details: gait is steady with RW, Ambulated x 20' with light 1 HHA.  Stairs            Wheelchair Mobility    Modified Rankin (Stroke Patients Only)       Balance Overall balance assessment: Needs assistance   Sitting balance-Leahy Scale: Good     Standing balance support: During functional activity;No upper extremity supported;Single  extremity supported Standing balance-Leahy Scale: Fair                               Pertinent Vitals/Pain      Home Living Family/patient expects to be discharged to:: Private residence Living Arrangements: Spouse/significant other Available Help at Discharge: Family;Available 24 hours/day Type of Home: House Home Access: Stairs to enter Entrance Stairs-Rails: None   Home Layout: One level Home Equipment: None      Prior Function Level of Independence: Independent               Hand Dominance        Extremity/Trunk Assessment   Upper Extremity Assessment Upper Extremity Assessment: Defer to OT evaluation    Lower Extremity Assessment Lower Extremity Assessment: Overall WFL for tasks assessed    Cervical / Trunk Assessment Cervical / Trunk Assessment: Kyphotic  Communication   Communication: No difficulties  Cognition Arousal/Alertness: Awake/alert Behavior During Therapy: WFL for tasks assessed/performed Overall Cognitive Status: Within Functional Limits for tasks assessed                                 General Comments: a little off on date of admission      General Comments      Exercises     Assessment/Plan    PT Assessment Patient needs continued PT services  PT Problem List Decreased activity tolerance;Decreased mobility  PT Treatment Interventions DME instruction;Gait training;Functional mobility training;Therapeutic activities;Patient/family education;Stair training    PT Goals (Current goals can be found in the Care Plan section)  Acute Rehab PT Goals Patient Stated Goal: to go home PT Goal Formulation: With patient/family Time For Goal Achievement: 06/02/18 Potential to Achieve Goals: Good    Frequency Min 3X/week   Barriers to discharge        Co-evaluation               AM-PAC PT "6 Clicks" Mobility  Outcome Measure Help needed turning from your back to your side while in a flat  bed without using bedrails?: A Little Help needed moving from lying on your back to sitting on the side of a flat bed without using bedrails?: A Little Help needed moving to and from a bed to a chair (including a wheelchair)?: A Little Help needed standing up from a chair using your arms (e.g., wheelchair or bedside chair)?: A Lot Help needed to walk in hospital room?: A Lot   6 Click Score: 13    End of Session Equipment Utilized During Treatment: Gait belt Activity Tolerance: Patient tolerated treatment well Patient left: in chair;with call bell/phone within reach;with chair alarm set   PT Visit Diagnosis: Unsteadiness on feet (R26.81);Muscle weakness (generalized) (M62.81)    Time: 4825-0037 PT Time Calculation (min) (ACUTE ONLY): 24 min   Charges:   PT Evaluation $PT Eval Low Complexity: 1 Low PT Treatments $Gait Training: 8-22 mins        Tresa Endo PT Acute Rehabilitation Services Pager [phone] Office [phone]   Claretha Cooper 05/19/2018, 10:41 AM

## 2018-05-20 LAB — TYPE AND SCREEN
ABO/RH(D): O POS
Antibody Screen: NEGATIVE
UNIT DIVISION: 0
Unit division: 0
Unit division: 0
Unit division: 0
Unit division: 0
Unit division: 0

## 2018-05-20 LAB — BPAM RBC
Blood Product Expiration Date: 202002272359
Blood Product Expiration Date: 202002272359
Blood Product Expiration Date: 202002272359
Blood Product Expiration Date: 202002272359
Blood Product Expiration Date: 202002272359
Blood Product Expiration Date: 202002272359
ISSUE DATE / TIME: 202001241343
ISSUE DATE / TIME: 202001241955
ISSUE DATE / TIME: 202001242203
ISSUE DATE / TIME: 202001241741
UNIT TYPE AND RH: 5100
UNIT TYPE AND RH: 5100
Unit Type and Rh: 5100
Unit Type and Rh: 5100
Unit Type and Rh: 5100
Unit Type and Rh: 5100

## 2018-05-20 LAB — CBC WITH DIFFERENTIAL/PLATELET
Abs Immature Granulocytes: 0.43 10*3/uL — ABNORMAL HIGH (ref 0.00–0.07)
BASOS ABS: 0 10*3/uL (ref 0.0–0.1)
Basophils Relative: 0 %
Eosinophils Absolute: 0 10*3/uL (ref 0.0–0.5)
Eosinophils Relative: 0 %
HCT: 18.4 % — ABNORMAL LOW (ref 39.0–52.0)
Hemoglobin: 5.7 g/dL — CL (ref 13.0–17.0)
Immature Granulocytes: 15 %
Lymphocytes Relative: 24 %
Lymphs Abs: 0.7 10*3/uL (ref 0.7–4.0)
MCH: 27.3 pg (ref 26.0–34.0)
MCHC: 31 g/dL (ref 30.0–36.0)
MCV: 88 fL (ref 80.0–100.0)
Monocytes Absolute: 0.2 10*3/uL (ref 0.1–1.0)
Monocytes Relative: 7 %
NEUTROS ABS: 1.5 10*3/uL — AB (ref 1.7–7.7)
Neutrophils Relative %: 54 %
PLATELETS: 19 10*3/uL — AB (ref 150–400)
RBC: 2.09 MIL/uL — ABNORMAL LOW (ref 4.22–5.81)
RDW: 15 % (ref 11.5–15.5)
WBC: 2.8 10*3/uL — ABNORMAL LOW (ref 4.0–10.5)
nRBC: 2.8 % — ABNORMAL HIGH (ref 0.0–0.2)

## 2018-05-20 LAB — COMPREHENSIVE METABOLIC PANEL
ALT: 14 U/L (ref 0–44)
AST: 21 U/L (ref 15–41)
Albumin: 2 g/dL — ABNORMAL LOW (ref 3.5–5.0)
Alkaline Phosphatase: 45 U/L (ref 38–126)
Anion gap: 9 (ref 5–15)
BUN: 47 mg/dL — ABNORMAL HIGH (ref 8–23)
CO2: 21 mmol/L — AB (ref 22–32)
Calcium: 8.1 mg/dL — ABNORMAL LOW (ref 8.9–10.3)
Chloride: 112 mmol/L — ABNORMAL HIGH (ref 98–111)
Creatinine, Ser: 1.19 mg/dL (ref 0.61–1.24)
GFR calc Af Amer: >60 mL/min (ref >60)
GFR calc non Af Amer: 59 mL/min — ABNORMAL LOW (ref >60)
Glucose, Bld: 134 mg/dL — ABNORMAL HIGH (ref 70–99)
POTASSIUM: 4.4 mmol/L (ref 3.5–5.1)
SODIUM: 142 mmol/L (ref 135–145)
Total Bilirubin: 0.4 mg/dL (ref 0.3–1.2)
Total Protein: 4.5 g/dL — ABNORMAL LOW (ref 6.5–8.1)

## 2018-05-20 LAB — URINE CULTURE: Culture: >=100000 — AB

## 2018-05-20 LAB — GLUCOSE, CAPILLARY: Glucose-Capillary: 127 mg/dL — ABNORMAL HIGH (ref 70–99)

## 2018-05-20 LAB — PREPARE RBC (CROSSMATCH)

## 2018-05-20 LAB — OCCULT BLOOD X 1 CARD TO LAB, STOOL: FECAL OCCULT BLD: POSITIVE — AB

## 2018-05-20 MED ORDER — SODIUM CHLORIDE 0.9 % IV SOLN
80.0000 mg | Freq: Once | INTRAVENOUS | Status: AC
  Administered 2018-05-20: 80 mg via INTRAVENOUS
  Filled 2018-05-20: qty 80

## 2018-05-20 MED ORDER — FUROSEMIDE 10 MG/ML IJ SOLN
20.0000 mg | Freq: Once | INTRAMUSCULAR | Status: AC
  Administered 2018-05-20: 20 mg via INTRAVENOUS
  Filled 2018-05-20: qty 2

## 2018-05-20 MED ORDER — CEPHALEXIN 500 MG PO CAPS
500.0000 mg | ORAL_CAPSULE | Freq: Two times a day (BID) | ORAL | Status: DC
  Administered 2018-05-21 – 2018-05-24 (×7): 500 mg via ORAL
  Filled 2018-05-20 (×7): qty 1

## 2018-05-20 MED ORDER — SODIUM CHLORIDE 0.9% IV SOLUTION
Freq: Once | INTRAVENOUS | Status: AC
  Administered 2018-05-20: 14:00:00 via INTRAVENOUS

## 2018-05-20 MED ORDER — SODIUM CHLORIDE 0.9 % IV SOLN
8.0000 mg/h | INTRAVENOUS | Status: DC
  Administered 2018-05-20 – 2018-05-21 (×2): 8 mg/h via INTRAVENOUS
  Filled 2018-05-20 (×5): qty 80

## 2018-05-20 MED ORDER — SODIUM CHLORIDE 0.9% IV SOLUTION: Freq: Once | INTRAVENOUS | Status: DC

## 2018-05-20 MED ORDER — ACETAMINOPHEN 325 MG PO TABS
650.0000 mg | ORAL_TABLET | Freq: Once | ORAL | Status: AC
  Administered 2018-05-20: 650 mg via ORAL
  Filled 2018-05-20: qty 2

## 2018-05-20 MED ORDER — PANTOPRAZOLE SODIUM 40 MG IV SOLR: 40.0000 mg | Freq: Two times a day (BID) | INTRAVENOUS | Status: DC

## 2018-05-20 MED ORDER — DIPHENHYDRAMINE HCL 25 MG PO CAPS
25.0000 mg | ORAL_CAPSULE | Freq: Once | ORAL | Status: AC
  Administered 2018-05-20: 25 mg via ORAL
  Filled 2018-05-20: qty 1

## 2018-05-20 NOTE — Progress Notes (Addendum)
[facility] Progress Note   Chief Complaint:   GI bleeding    SUBJECTIVE:    feels okay, offers no complaints. Called by Hospitalist, patient had melena again last night.    ASSESSMENT AND PLAN:   1. 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 He has been getting palliative radiation. Off chemo due to pancytopenia.   2. Pancytopenia . Platelets 19, WBC 2. . Hem/Onc requesting BM biopsy to check for  Mantle Cell lymphoma involvement with bone marrow.  Getting Granix  3. Profound anemia / melena Hgb 2.8 g , down from 8.6 mid Jan in absence of NSAID use. Had black stool prior to admission and now with recurrent melena and concurrent drop in hgb from 8.6 to 5.7 overnight.  BUN elevated ~ 40. He is hemodynamically stable -Hospitalist is ordering platelets and 3 units of pRBC and 2 units of platelets.  -Made patient NPO as he will need EGD. The risks and benefits of EGD were discussed and the patient agrees to proceed. If platelets transfused done earlier enough we can possibly perform the EGD this afternoon. Ideally he should at least have one unit of blood completed by time of procedure.    Addendum: Patient got platelets, pRBC not yet started. Should be transfused prior to EGD. Since patient is stable, no active bleeding at present will move EGD to am.  stable. Will check platelets around 10 pm and leave standing order for platelets based on results.   Addendum I have taken an interval history, reviewed the chart, and examined the patient. I agree with the Advanced Practitioner's note and impression. Patient with concern of upper GI bleeding/melena in the setting of profound thrombocytopenia due to history of mantle cell lymphoma and prior therapy causing bone marrow fibrosis. He is receiving IV platelets and packed red cells. Target hemoglobin greater than 7 and target platelet count greater than 50,000 I have discussed  upper endoscopy with the patient and his son, who is at bedside.  Assuming that we can maintain platelet count greater than 50,000 we will plan upper endoscopy tomorrow with monitored anesthesia care to evaluate upper GI bleeding. He remains hemodynamically stable Continue PPI  OBJECTIVE:     Vital signs in last 24 hours: Temp:  [98 F (36.7 C)-98.3 F (36.8 C)] 98.1 F (36.7 C) (01/28 0539) Pulse Rate:  [79-112] 81 (01/28 0539) Resp:  [16-21] 16 (01/28 0539) BP: (123-143)/(80-95) 126/80 (01/28 0539) SpO2:  [99 %-100 %] 99 % (01/28 0539) Last BM Date: 05/20/18 General:   Alert, thin male in NAD EENT:  Normal hearing, non icteric sclera, conjunctive pink.  Heart:  Regular rate and rhythm.  No lower extremity edema   Pulm: Normal respiratory effort Abdomen:  Soft, nondistended, nontender.  Normal bowel sounds, no masses felt.       Neurologic:  Alert and  oriented x4;  grossly normal neurologically. Psych:  Pleasant, cooperative.  Normal mood and affect.   Intake/Output from previous day: 01/27 0701 - 01/28 0700 In: 340 [P.O.:240; IV Piggyback:100] Out: 400 [Urine:400] Intake/Output this shift: No intake/output data recorded.  Lab Results: Recent Labs    05/18/18 0317 05/19/18 0259 05/20/18 0513  WBC 1.5* 1.7* 2.8*  HGB 8.4* 8.6* 5.7*  HCT 26.0* 26.6* 18.4*  PLT 28* 24* 19*   BMET Recent Labs    05/18/18 0317 05/19/18 0259 05/20/18 0513  NA 141 144 142  K 3.7 3.3* 4.4  CL 112* 113* 112*  CO2 21* 23 21*  GLUCOSE 128* 114* 134*  BUN 44* 33* 47*  CREATININE 1.17 1.22 1.19  CALCIUM 8.0* 8.4* 8.1*   LFT Recent Labs    05/20/18 0513  PROT 4.5*  ALBUMIN 2.0*  AST 21  ALT 14  ALKPHOS 45  BILITOT 0.4    Principal Problem:   Gastrointestinal hemorrhage with melena Active Problems:   Protein-calorie malnutrition, severe (HCC)   Mantle cell lymphoma of lymph nodes of head, face, and neck (HCC)   Prostate cancer (HCC)   Pancytopenia (HCC)   Symptomatic  anemia   Hypothermia   Thrombocytopenia (HCC)   UTI due to Klebsiella species     LOS: 1 day   Tye Savoy ,NP 05/20/2018, 9:03 AM

## 2018-05-20 NOTE — Progress Notes (Signed)
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  Patient noted to have a hemoglobin of 3.1 in the ED patient did endorse black stool 3 days prior to admission however none since then.  Patient also noted to be severely pancytopenic.  Patient transfused 4 units of packed red blood cells.  GI consulted.   Assessment & Plan:   Principal Problem:   Gastrointestinal hemorrhage with melena Active Problems:   Protein-calorie malnutrition, severe (HCC)   Mantle cell lymphoma of lymph nodes of head, face, and neck (HCC)   Prostate cancer (HCC)   Pancytopenia (HCC)   Symptomatic anemia   Hypothermia   Thrombocytopenia (HCC)   UTI due to Klebsiella species  1 symptomatic anemia versus GI hemorrhage with melena Patient presenting with fatigue, generalized weakness noted to have a severe anemia globin of 2.8 noted and a cancer center and repeat of 3.1 in the ED.  Patient did endorse dark stool about 3 days prior to admission.Patient noted to have a hemoglobin of 8.6 on 05/05/2018.  Initial concern for acute GI bleed however patient denies any ongoing GI bleed apart from 1 dark stool 3 days prior to admission.  Patient transfused 4 units packed red blood cells.  Hemoglobin this morning is 5.7.  Patient noted to have bloody bowel movements overnight 05/19/2018...  Patient noted to have a large melanotic stool last night at 6 PM on 05/17/2018.  Patient seen in consultation by gastroenterology who feel patient's anemia/pancytopenia  likely secondary to fibrosis and involvement of bone marrow with mantle cell lymphoma and recommending hematology oncology consultation.  Anemia panel was not obtained on admission.  Patient noted to have a normocytic anemia.  LFTs within normal limits.  Bilirubin within normal limits.  LDH of 695.  Peripheral smear per hematology was negative for schistocytes however patient recently transfused prior to peripheral smear being obtained.  Reticulocyte count was 1%, immature reticulocyte fraction was 23.3%.  PSA of 8.1.  Continue PPI twice daily.  Patient seen in consultation by hematology/oncology who initially were recommending bone marrow biopsy however patient has been seen by his oncologist this morning, Dr. Alen Blew who is recommending to hold off on bone marrow biopsy at this time as well was done less than 2 months ago.  Patient noted to have some bleeding overnight.  Hemoglobin dropped to 5.7.  Platelet count at 19.  3 units of packed red blood cells ordered to be transfused.  2 packs of platelets ordered to be transfused.  Goal of platelets greater than 50,000 in order for patient to undergo an upper endoscopy.  GI reconsulted for probable endoscopy.  Hematology/oncology following.  2.  Severe pancytopenia Concern likely secondary to bone marrow invasion/fibrosis from mantle cell lymphoma versus secondary to marrow toxicity from ibrutinib.  Patient has been seen by oncology peripheral smear which was reviewed per oncologist was negative for schistocytes outpatient was recently transfused.  LDH of 695.  Reticulocyte was 1%, immature reticulocyte fraction was 23.3%.  PSA of 8.1.  DAT, IgG was negative, DAT complement negative.  Patient neutropenic with an [place] of  0.9.  Platelet count at 28,000.  Hemoglobin was 8.4 posttransfusion.  Hemoglobin down to 5.7 this morning.  3 units of packed red blood cells have been ordered for transfusion as patient noted to have bloody bowel movements overnight.  Platelet count  at 19,000 and patient ordered 2 packs of platelets to be transfused in order to keep platelets greater than 50,004 patient to undergo endoscopy.  [place] currently at 1.5.  Discontinue Granix.  Hematology/oncology following.    3.  Castration sensitive prostate cancer with lymphadenopathy Patient receiving Lupron injections every 4 months.  Next injection scheduled for February 2020.  Per oncology.  4.  Stage III mantle cell lymphoma diagnosed in December 2018 Per patient's oncologist note from 05/05/2018 patient currently off therapy at this time after completing a course of ibrutinib with complete response.  Treatment discontinued secondary to pancytopenia.  Oncology following.   5.  Severe protein calorie malnutrition Secondary to problems #3 and 4.  Continue Ensure.  Nutritional supplementation.  6.  Hypothermia Questionable etiology.  Chest x-ray on admission and on 05/17/2018 unremarkable.  Urinalysis consistent with Klebsiella pneumonia UTI.  Patient currently afebrile.  Blood cultures with no growth today. Due to patient's pancytopenia and neutropenia patient was started empirically on IV Rocephin.  Transition to oral Keflex tomorrow.  Follow.  7.  Klebsiella pneumonia UTI Urinalysis concerning for possible UTI.  Patient pancytopenic and also neutropenic with an ANC of 0.9.  Urine cultures consistent with Klebsiella pneumonia greater than 100,000 colonies.  Due to immunocompromised state we will treat empirically with IV Rocephin.  Transition from IV Rocephin to oral Keflex tomorrow to complete a 7-day course of antibiotic treatment.  8.  Hypokalemia Repleted.   DVT prophylaxis: SCDs Code Status: Full Family Communication: Updated patient and wife and daughter at bedside. Disposition Plan: Once hemoglobin is stable and per GI.    Consultants:   Oncology/hematology: Dr. Lindi Adie 05/17/2018  Gastroenterology: Dr. Silverio Decamp 05/16/2018  Procedures:   CXR, 05/16/2018,05/17/2018  Transfused  4 units packed red blood cells 05/16/2018-05/17/2018  Transfused 3 units packed red blood cells 05/20/2018  Transfused 2 packs of platelets 05/20/2018  Antimicrobials:   IV Rocephin 05/18/2018>>>>> 05/20/2018  Oral Keflex 05/21/2018   Subjective: Patient noted to have some melanotic stools overnight as well as multiple maroon-colored stools overnight.  Patient denies any chest pain or shortness of breath.    Objective: Vitals:   05/20/18 1250 05/20/18 1430 05/20/18 1435 05/20/18 1445  BP: 109/72 122/79  116/76  Pulse: 79 (!) 108  (!) 108  Resp: _0 Temp: 98.7 F (37.1 C) 98.4 F (36.9 C)  98.4 F (36.9 C)  TempSrc: Oral Oral Oral Oral  SpO2: 99% 100%  100%  Weight:      Height:        Intake/Output Summary (Last 24 hours) at 05/20/2018 1646 Last data filed at 05/20/2018 1430 Gross per 24 hour  Intake 1668 ml  Output 150 ml  Net 1518 ml   Filed Weights   05/16/18 1722  Weight: 52.6 kg    Examination:  General exam: Cachectic.  Frail.  Chronically ill-appearing.  Respiratory system: CTAB.  No wheezes, no crackles, no rhonchi.  Normal respiratory effort.  Speaking in full sentences.  Cardiovascular system: Regular rate rhythm no murmurs rubs or gallops.  No JVD.  No lower extremity edema. Gastrointestinal system: Abdomen is soft, nontender, nondistended, positive bowel sounds.  No rebound.  No guarding.  Central nervous system: Alert and oriented. No  focal neurological deficits. Extremities: Symmetric 5 x 5 power. Skin: No rashes, lesions or ulcers Psychiatry: Judgement and insight appear normal. Mood & affect appropriate.     Data Reviewed: I have personally reviewed following labs and imaging studies  CBC: Recent Labs  Lab 05/17/18 0237 05/17/18 1620 05/18/18 0317 05/19/18 0259 05/20/18 0513  WBC 1.5* 1.6* 1.5* 1.7* 2.8*  NEUTROABS 0.8* 0.9* 0.9* 0.9* 1.5*  HGB 8.4* 8.7* 8.4* 8.6* 5.7*  HCT 25.6* 26.3* 26.0* 26.6* 18.4*  MCV 85.9 85.1 85.5 86.1 88.0   PLT 33* 30* 28* 24* 19*   Basic Metabolic Panel: Recent Labs  Lab 05/16/18 1131 05/17/18 0237 05/18/18 0317 05/19/18 0259 05/20/18 0513  NA 144 145 141 144 142  K 4.2 3.9 3.7 3.3* 4.4  CL 107 114* 112* 113* 112*  CO2 18* 21* 21* 23 21*  GLUCOSE 291* 110* 128* 114* 134*  BUN 60* 61* 44* 33* 47*  CREATININE 1.32* 1.19 1.17 1.22 1.19  CALCIUM 8.7* 8.1* 8.0* 8.4* 8.1*   GFR: Estimated Creatinine Clearance: 39.9 mL/min (by C-G formula based on SCr of 1.19 mg/dL). Liver Function Tests: Recent Labs  Lab 05/16/18 1131 05/17/18 0237 05/18/18 0317 05/19/18 0259 05/20/18 0513  AST 17 25 24 17 21  ALT 15 17 12 15 14  ALKPHOS 60 52 50 46 45  BILITOT 0.4 0.7 0.8 0.5 0.4  PROT 5.4* 5.1* 5.2* 5.3* 4.5*  ALBUMIN 2.1* 2.4* 2.3* 2.3* 2.0*   No results for input(s): LIPASE, AMYLASE in the last 168 hours. No results for input(s): AMMONIA in the last 168 hours. Coagulation Profile: No results for input(s): INR, PROTIME in the last 168 hours. Cardiac Enzymes: No results for input(s): CKTOTAL, CKMB, CKMBINDEX, TROPONINI in the last 168 hours. BNP (last 3 results) No results for input(s): PROBNP in the last 8760 hours. HbA1C: No results for input(s): HGBA1C in the last 72 hours. CBG: Recent Labs  Lab 05/16/18 1807 05/20/18 0338  GLUCAP 96 127*   Lipid Profile: No results for input(s): CHOL, HDL, LDLCALC, TRIG, CHOLHDL, LDLDIRECT in the last 72 hours. Thyroid Function Tests: No results for input(s): TSH, T4TOTAL, FREET4, T3FREE, THYROIDAB in the last 72 hours. Anemia Panel: No results for input(s): VITAMINB12, FOLATE, FERRITIN, TIBC, IRON, RETICCTPCT in the last 72 hours. Sepsis Labs: No results for input(s): PROCALCITON, LATICACIDVEN in the last 168 hours.  Recent Results (from the past 240 hour(s))  MRSA PCR Screening     Status: None   Collection Time: 05/16/18  5:35 PM  Result Value Ref Range Status   MRSA by PCR NEGATIVE NEGATIVE Final    Comment:        The GeneXpert  MRSA Assay (FDA approved for NASAL specimens only), is one component of a comprehensive MRSA colonization surveillance program. It is not intended to diagnose MRSA infection nor to guide or monitor treatment for MRSA infections. Performed at [facility], [address] [place], [place] [address]   Culture, Urine     Status: Abnormal   Collection Time: 05/17/18  7:58 AM  Result Value Ref Range Status   Specimen Description   Final    URINE, RANDOM Performed at [facility], [address] [place], [place] [address]    Special Requests   Final    NONE Performed at [facility], [address] [place], [place] [address]    Culture >=100,000 COLONIES/mL KLEBSIELLA PNEUMONIAE (A)  Final   Report Status 05/20/2018 FINAL  Final     Organism ID, Bacteria KLEBSIELLA PNEUMONIAE (A)  Final      Susceptibility   Klebsiella pneumoniae - MIC*    AMPICILLIN >=32 RESISTANT Resistant     CEFAZOLIN <=4 SENSITIVE Sensitive     CEFTRIAXONE <=1 SENSITIVE Sensitive     CIPROFLOXACIN <=0.25 SENSITIVE Sensitive     GENTAMICIN <=1 SENSITIVE Sensitive     IMIPENEM <=0.25 SENSITIVE Sensitive     NITROFURANTOIN 32 SENSITIVE Sensitive     TRIMETH/SULFA <=20 SENSITIVE Sensitive     AMPICILLIN/SULBACTAM >=32 RESISTANT Resistant     PIP/TAZO 8 SENSITIVE Sensitive     Extended ESBL NEGATIVE Sensitive     * >=100,000 COLONIES/mL KLEBSIELLA PNEUMONIAE  Culture, blood (Routine X 2) w Reflex to ID Panel     Status: None (Preliminary result)   Collection Time: 05/17/18  9:13 AM  Result Value Ref Range Status   Specimen Description   Final    BLOOD RIGHT HAND Performed at [facility] [address], [place], [place] [address]    Special Requests   Final    BOTTLES DRAWN AEROBIC ONLY Blood Culture adequate volume Performed at [facility] [address] [place], [place] [address]    Culture   Final    NO  GROWTH 3 DAYS Performed at [facility], [place] [address], [place], [place] [address]    Report Status PENDING  Incomplete  Culture, blood (Routine X 2) w Reflex to ID Panel     Status: None (Preliminary result)   Collection Time: 05/17/18  9:13 AM  Result Value Ref Range Status   Specimen Description   Final    BLOOD LEFT HAND Performed at [facility] [address], [place], [place] [address]    Special Requests   Final    BOTTLES DRAWN AEROBIC ONLY Blood Culture adequate volume Performed at [facility] [address], [place], [place] [address]    Culture   Final    NO GROWTH 3 DAYS Performed at [facility], [place] [address], [place], [place] [address]    Report Status PENDING  Incomplete         Radiology Studies: No results found.      Scheduled Meds: . sodium chloride   Intravenous Once  . [START ON 05/21/2018] cephALEXin  500 mg Oral Q12H  . feeding supplement (ENSURE ENLIVE)  237 mL Oral BID BM  . furosemide  20 mg Intravenous Once  . mouth rinse  15 mL Mouth Rinse BID  . [START ON 05/23/2018] pantoprazole  40 mg Intravenous Q12H  . senna-docusate  1 tablet Oral BID   Continuous Infusions: . pantoprozole (PROTONIX) infusion       LOS: 1 day    Time spent: 40 mins    Irine Seal, MD [facility]  If 7PM-7AM, please contact night-coverage [email or web address] Password [facility] 05/20/2018, 4:46 PM

## 2018-05-20 NOTE — Anesthesia Preprocedure Evaluation (Addendum)
Anesthesia Evaluation  Patient identified by MRN, date of birth, ID band Patient awake    Reviewed: Allergy & Precautions, H&P , NPO status , Patient's Chart, lab work & pertinent test results  Airway Mallampati: II  TM Distance: >3 FB Neck ROM: Full    Dental  (+) Poor Dentition, Dental Advisory Given   Pulmonary pneumonia, resolved, former smoker,    Pulmonary exam normal breath sounds clear to auscultation       Cardiovascular negative cardio ROS Normal cardiovascular exam Rhythm:Regular Rate:Normal     Neuro/Psych CVA negative psych ROS   GI/Hepatic negative GI ROS, Neg liver ROS,   Endo/Other  negative endocrine ROS  Renal/GU Renal disease     Musculoskeletal   Abdominal   Peds  Hematology  (+) Blood dyscrasia, anemia ,   Anesthesia Other Findings   Reproductive/Obstetrics                            Anesthesia Physical Anesthesia Plan  ASA: II  Anesthesia Plan: MAC   Post-op Pain Management:    Induction: Intravenous  PONV Risk Score and Plan: 1 and Propofol infusion  Airway Management Planned: Nasal Cannula and Natural Airway  Additional Equipment: None  Intra-op Plan:   Post-operative Plan:   Informed Consent: I have reviewed the patients History and Physical, chart, labs and discussed the procedure including the risks, benefits and alternatives for the proposed anesthesia with the patient or authorized representative who has indicated his/her understanding and acceptance.     Dental advisory given  Plan Discussed with: CRNA  Anesthesia Plan Comments:        Anesthesia Quick Evaluation

## 2018-05-20 NOTE — H&P (View-Only) (Signed)
Joel [name] Progress Note   Chief Complaint:   GI bleeding    SUBJECTIVE:    feels okay, offers no complaints. Called by Hospitalist, patient had melena again last night.    ASSESSMENT AND PLAN:   1. 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 He has been getting palliative radiation. Off chemo due to pancytopenia.   2. Pancytopenia . Platelets 19, WBC 2. . Hem/Onc requesting BM biopsy to check for  Mantle Cell lymphoma involvement with bone marrow.  Getting Granix  3. Profound anemia / melena Hgb 2.8 g , down from 8.6 mid Jan in absence of NSAID use. Had black stool prior to admission and now with recurrent melena and concurrent drop in hgb from 8.6 to 5.7 overnight.  BUN elevated ~ 40. He is hemodynamically stable -Hospitalist is ordering platelets and 3 units of pRBC and 2 units of platelets.  -Made patient NPO as he will need EGD. The risks and benefits of EGD were discussed and the patient agrees to proceed. If platelets transfused done earlier enough we can possibly perform the EGD this afternoon. Ideally he should at least have one unit of blood completed by time of procedure.    Addendum: Patient got platelets, pRBC not yet started. Should be transfused prior to EGD. Since patient is stable, no active bleeding at present will move EGD to am.  stable. Will check platelets around 10 pm and leave standing order for platelets based on results.   Addendum I have taken an interval history, reviewed the chart, and examined the patient. I agree with the Advanced Practitioner's note and impression. Patient with concern of upper GI bleeding/melena in the setting of profound thrombocytopenia due to history of mantle cell lymphoma and prior therapy causing bone marrow fibrosis. He is receiving IV platelets and packed red cells. Target hemoglobin greater than 7 and target platelet count greater than 50,000 I have discussed  upper endoscopy with the patient and his son, who is at bedside.  Assuming that we can maintain platelet count greater than 50,000 we will plan upper endoscopy tomorrow with monitored anesthesia care to evaluate upper GI bleeding. He remains hemodynamically stable Continue PPI  OBJECTIVE:     Vital signs in last 24 hours: Temp:  [98 F (36.7 C)-98.3 F (36.8 C)] 98.1 F (36.7 C) (01/28 0539) Pulse Rate:  [79-112] 81 (01/28 0539) Resp:  [16-21] 16 (01/28 0539) BP: (123-143)/(80-95) 126/80 (01/28 0539) SpO2:  [99 %-100 %] 99 % (01/28 0539) Last BM Date: 05/20/18 General:   Alert, thin male in NAD EENT:  Normal hearing, non icteric sclera, conjunctive pink.  Heart:  Regular rate and rhythm.  No lower extremity edema   Pulm: Normal respiratory effort Abdomen:  Soft, nondistended, nontender.  Normal bowel sounds, no masses felt.       Neurologic:  Alert and  oriented x4;  grossly normal neurologically. Psych:  Pleasant, cooperative.  Normal mood and affect.   Intake/Output from previous day: 01/27 0701 - 01/28 0700 In: 340 [P.O.:240; IV Piggyback:100] Out: 400 [Urine:400] Intake/Output this shift: No intake/output data recorded.  Lab Results: Recent Labs    05/18/18 0317 05/19/18 0259 05/20/18 0513  WBC 1.5* 1.7* 2.8*  HGB 8.4* 8.6* 5.7*  HCT 26.0* 26.6* 18.4*  PLT 28* 24* 19*   BMET Recent Labs    05/18/18 0317 05/19/18 0259 05/20/18 0513  NA 141 144 142  K 3.7 3.3* 4.4  CL 112* 113* 112*  CO2 21* 23 21*  GLUCOSE 128* 114* 134*  BUN 44* 33* 47*  CREATININE 1.17 1.22 1.19  CALCIUM 8.0* 8.4* 8.1*   LFT Recent Labs    05/20/18 0513  PROT 4.5*  ALBUMIN 2.0*  AST 21  ALT 14  ALKPHOS 45  BILITOT 0.4    Principal Problem:   Gastrointestinal hemorrhage with melena Active Problems:   Protein-calorie malnutrition, severe (HCC)   Mantle cell lymphoma of lymph nodes of head, face, and neck (HCC)   Prostate cancer (HCC)   Pancytopenia (HCC)   Symptomatic  anemia   Hypothermia   Thrombocytopenia (HCC)   UTI due to Klebsiella species     LOS: 1 day   Tye Savoy ,NP 05/20/2018, 9:03 AM

## 2018-05-20 NOTE — Progress Notes (Signed)
Patient had a large dark red bowel movement.

## 2018-05-20 NOTE — Progress Notes (Signed)
IP PROGRESS NOTE  Subjective:     [name] reports feeling weak this morning after having 2 melanotic stools overnight.  He also noted to have bleeding per rectum.  His hemoglobin dropped from 8.6 to 5.7.  He denies any hemoptysis or hematemesis.  He denies any chest pain or palpitation.  He was able to participate in physical therapy during the day yesterday but noticed rather sharp decline overnight and today.  Objective:  Vital signs in last 24 hours: Temp:  [97.6 F (36.4 C)-98.3 F (36.8 C)] 98.1 F (36.7 C) (01/28 0539) Pulse Rate:  [75-112] 81 (01/28 0539) Resp:  [16-23] 16 (01/28 0539) BP: (123-143)/(80-95) 126/80 (01/28 0539) SpO2:  [99 %-100 %] 99 % (01/28 0539) Weight change:  Last BM Date: 05/20/18  Intake/Output from previous day: 01/27 0701 - 01/28 0700 In: 340 [P.O.:240; IV Piggyback:100] Out: 400 [Urine:400]    General appearance: Comfortable appearing without any discomfort Head: Normocephalic without any trauma Oropharynx: Mucous membranes are moist and pink without any thrush or ulcers. Eyes: Pupils are equal and round reactive to light. Lymph nodes: No cervical, supraclavicular, inguinal or axillary lymphadenopathy.   Heart:regular rate and rhythm.  S1 and S2 without leg edema. Lung: Clear without any rhonchi or wheezes.  No dullness to percussion. Abdomin: Soft, nontender, nondistended with good bowel sounds.  No hepatosplenomegaly. Musculoskeletal: No joint deformity or effusion.  Full range of motion noted. Neurological: No deficits noted on motor, sensory and deep tendon reflex exam. Skin: No petechial rash or dryness.  Appeared moist.      Lab Results: Recent Labs    05/19/18 0259 05/20/18 0513  WBC 1.7* 2.8*  HGB 8.6* 5.7*  HCT 26.6* 18.4*  PLT 24* 19*    BMET Recent Labs    05/19/18 0259 05/20/18 0513  NA 144 142  K 3.3* 4.4  CL 113* 112*  CO2 23 21*  GLUCOSE 114* 134*  BUN 33* 47*  CREATININE 1.22 1.19  CALCIUM 8.4* 8.1*     Studies/Results: No results found.  Medications: I have reviewed the patient's current medications.  Assessment/Plan:  78 year old man with:   1.  Pancytopenia: Etiology is related to lymphoma as well as likely bone marrow fibrosis associated with previous treatment and disease infiltration.  He is currently receiving supportive transfusions as needed.  His hemoglobin has dropped acutely and his platelets remains low.  I agree with that 2 units of platelet transfusion as well as 3 units of packed red cells.  Given his active bleeding, like to keep his platelets close to 50,000.  2.  Melena: This could be exacerbated by his thrombocytopenia but ruling out a upper GI source of bleed may be needed.  I would be in favor of endoscopy if it can be done after improving his platelets.  3. Mantle cell lymphoma: Therapy on hold because of pancytopenia and acute illness.    4.  Prostate cancer: He is currently on androgen deprivation therapy alone.  His PSA although rising but not a candidate for any additional therapy.  5.  Prognosis: Poor overall given his to incurable malignancy and progressive pancytopenia.   25 minutes was spent with the patient face-to-face today.  More than 50% of time was dedicated to reviewing his disease status with his daughter, discussing treatment options and outlining immediate treatment plan.    LOS: 1 day   Zola Button 05/20/2018, 7:58 AM

## 2018-05-21 ENCOUNTER — Encounter (HOSPITAL_COMMUNITY)
Admission: EM | Disposition: A | Discharge status: Home Health | Payer: Self-pay | Source: Home / Self Care | Attending: Internal Medicine

## 2018-05-21 ENCOUNTER — Encounter (HOSPITAL_COMMUNITY): Payer: Self-pay

## 2018-05-21 ENCOUNTER — Inpatient Hospital Stay (HOSPITAL_COMMUNITY): Payer: Medicare HMO | Admitting: Certified Registered Nurse Anesthetist

## 2018-05-21 DIAGNOSIS — K31819 Angiodysplasia of stomach and duodenum without bleeding: Secondary | ICD-10-CM

## 2018-05-21 DIAGNOSIS — K2970 Gastritis, unspecified, without bleeding: Secondary | ICD-10-CM

## 2018-05-21 DIAGNOSIS — K2990 Gastroduodenitis, unspecified, without bleeding: Secondary | ICD-10-CM

## 2018-05-21 HISTORY — PX: ESOPHAGOGASTRODUODENOSCOPY (EGD) WITH PROPOFOL: SHX5813

## 2018-05-21 HISTORY — PX: BIOPSY: SHX5522

## 2018-05-21 HISTORY — PX: HOT HEMOSTASIS: SHX5433

## 2018-05-21 LAB — BPAM RBC
BLOOD PRODUCT EXPIRATION DATE: 202003012359
Blood Product Expiration Date: 202002282359
Blood Product Expiration Date: 202003012359
ISSUE DATE / TIME: 202001281425
ISSUE DATE / TIME: 202001281812
ISSUE DATE / TIME: 202001282129
Unit Type and Rh: 5100
Unit Type and Rh: 5100
Unit Type and Rh: 5100

## 2018-05-21 LAB — COMPREHENSIVE METABOLIC PANEL
ALT: 14 U/L (ref 0–44)
AST: 22 U/L (ref 15–41)
Albumin: 2.3 g/dL — ABNORMAL LOW (ref 3.5–5.0)
Alkaline Phosphatase: 53 U/L (ref 38–126)
Anion gap: 8 (ref 5–15)
BUN: 38 mg/dL — ABNORMAL HIGH (ref 8–23)
CHLORIDE: 112 mmol/L — AB (ref 98–111)
CO2: 24 mmol/L (ref 22–32)
Calcium: 8.6 mg/dL — ABNORMAL LOW (ref 8.9–10.3)
Creatinine, Ser: 1.31 mg/dL — ABNORMAL HIGH (ref 0.61–1.24)
GFR calc Af Amer: >60 mL/min (ref >60)
GFR calc non Af Amer: 53 mL/min — ABNORMAL LOW (ref >60)
Glucose, Bld: 97 mg/dL (ref 70–99)
Potassium: 3.5 mmol/L (ref 3.5–5.1)
SODIUM: 144 mmol/L (ref 135–145)
Total Bilirubin: 0.7 mg/dL (ref 0.3–1.2)
Total Protein: 5.2 g/dL — ABNORMAL LOW (ref 6.5–8.1)

## 2018-05-21 LAB — BPAM PLATELET PHERESIS
Blood Product Expiration Date: 202001292359
Blood Product Expiration Date: 202001302359
ISSUE DATE / TIME: 202001280938
ISSUE DATE / TIME: 202001281112
Unit Type and Rh: 5100
Unit Type and Rh: 5100

## 2018-05-21 LAB — CBC WITH DIFFERENTIAL/PLATELET
Abs Immature Granulocytes: 0.21 10*3/uL — ABNORMAL HIGH (ref 0.00–0.07)
Basophils Absolute: 0 10*3/uL (ref 0.0–0.1)
Basophils Relative: 1 %
Eosinophils Absolute: 1.1 10*3/uL — ABNORMAL HIGH (ref 0.0–0.5)
Eosinophils Relative: 37 %
HCT: 26.8 % — ABNORMAL LOW (ref 39.0–52.0)
Hemoglobin: 8.8 g/dL — ABNORMAL LOW (ref 13.0–17.0)
Immature Granulocytes: 8 %
Lymphocytes Relative: 17 %
Lymphs Abs: 0.5 10*3/uL — ABNORMAL LOW (ref 0.7–4.0)
MCH: 28 pg (ref 26.0–34.0)
MCHC: 32.8 g/dL (ref 30.0–36.0)
MCV: 85.4 fL (ref 80.0–100.0)
MONO ABS: 0.2 10*3/uL (ref 0.1–1.0)
MONOS PCT: 7 %
Neutro Abs: 0.9 10*3/uL — ABNORMAL LOW (ref 1.7–7.7)
Neutrophils Relative %: 30 %
Platelets: 94 10*3/uL — ABNORMAL LOW (ref 150–400)
RBC: 3.14 MIL/uL — ABNORMAL LOW (ref 4.22–5.81)
RDW: 14.5 % (ref 11.5–15.5)
WBC: 2.8 10*3/uL — ABNORMAL LOW (ref 4.0–10.5)
nRBC: 3.2 % — ABNORMAL HIGH (ref 0.0–0.2)

## 2018-05-21 LAB — PREPARE PLATELET PHERESIS
UNIT DIVISION: 0
Unit division: 0

## 2018-05-21 LAB — TYPE AND SCREEN
ABO/RH(D): O POS
Antibody Screen: NEGATIVE
Unit division: 0
Unit division: 0
Unit division: 0

## 2018-05-21 SURGERY — ESOPHAGOGASTRODUODENOSCOPY (EGD) WITH PROPOFOL
Anesthesia: Monitor Anesthesia Care

## 2018-05-21 MED ORDER — PROPOFOL 500 MG/50ML IV EMUL
INTRAVENOUS | Status: DC | PRN
  Administered 2018-05-21: 125 ug/kg/min via INTRAVENOUS

## 2018-05-21 MED ORDER — LACTATED RINGERS IV SOLN
INTRAVENOUS | Status: DC | PRN
  Administered 2018-05-21: 10:00:00 via INTRAVENOUS

## 2018-05-21 MED ORDER — PROPOFOL 10 MG/ML IV BOLUS
INTRAVENOUS | Status: AC
  Filled 2018-05-21: qty 20

## 2018-05-21 MED ORDER — SODIUM CHLORIDE 0.9 % IV SOLN: INTRAVENOUS | Status: DC

## 2018-05-21 MED ORDER — PHENYLEPHRINE 40 MCG/ML (10ML) SYRINGE FOR IV PUSH (FOR BLOOD PRESSURE SUPPORT)
PREFILLED_SYRINGE | INTRAVENOUS | Status: DC | PRN
  Administered 2018-05-21: 80 ug via INTRAVENOUS
  Administered 2018-05-21: 120 ug via INTRAVENOUS
  Administered 2018-05-21: 80 ug via INTRAVENOUS
  Administered 2018-05-21: 120 ug via INTRAVENOUS

## 2018-05-21 MED ORDER — LIDOCAINE 2% (20 MG/ML) 5 ML SYRINGE
INTRAMUSCULAR | Status: DC | PRN
  Administered 2018-05-21: 50 mg via INTRAVENOUS

## 2018-05-21 MED ORDER — PROPOFOL 10 MG/ML IV BOLUS
INTRAVENOUS | Status: DC | PRN
  Administered 2018-05-21: 10 mg via INTRAVENOUS
  Administered 2018-05-21: 30 mg via INTRAVENOUS

## 2018-05-21 MED ORDER — PANTOPRAZOLE SODIUM 40 MG PO TBEC
40.0000 mg | DELAYED_RELEASE_TABLET | Freq: Every day | ORAL | Status: DC
  Administered 2018-05-21 – 2018-05-22 (×2): 40 mg via ORAL
  Filled 2018-05-21 (×3): qty 1

## 2018-05-21 SURGICAL SUPPLY — 15 items

## 2018-05-21 NOTE — Progress Notes (Signed)
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  Patient was noted to have a hemoglobin of 3.1 in the ED. Patient did endorse black stool 3 days prior to admission however none since then.  Patient also noted to be severely pancytopenic.  Patient transfused 4 units of packed red blood cells.  GI consulted.  Assessment/Plan:  Principal Problem:   Gastrointestinal hemorrhage with melena Active Problems:   Protein-calorie malnutrition, severe (HCC)   Mantle cell lymphoma of lymph nodes of head, face, and neck (HCC)   Prostate cancer (HCC)   Pancytopenia (HCC)   Symptomatic anemia   Hypothermia   Thrombocytopenia (HCC)   UTI due to Klebsiella species   Gastritis and gastroduodenitis   AVM (arteriovenous malformation) of stomach, acquired  1. Symptomatic anemia  Patient presented with fatigue, generalized weakness, was noted to have a severe anemia with hemoglobin of 2.8 at the cancer center and a repeat of 3.1 in the ED. Patient did endorse dark stool about 3 days prior to admission. Patient was transfused 4 units packed red blood cells.  Hemoglobin initially improved but later on dropped down to 5.7 again.  Patient had large melanotic stool. 3 more units of packed red blood cells  and  2 packs of platelets transfused.  GI was consulted. Patient underwent EGD today.  He was noted to have mild, non-specific gastritis and bulbar duodenitis. Biopsies taken from the stomach to  check for H. Pylori. Two small,  non-bleeding gastric AVMs were noted and treated with application of APC. Continue PPI twice daily.   2. Severe pancytopenia - Hemato-oncology consult was also obtained because anemia/ pancytopenia was suspected partly secondary to fibrosis and involvement of bone marrow with mantle cell lymphoma. Bone marrow biopsy was done less than 2 months ago. LFTs within normal limits.  Bilirubin within normal limits.  LDH of 695.  Peripheral smear per hematology was negative for schistocytes however patient was recently transfused prior to peripheral smear being obtained.  Reticulocyte count was 1%, immature reticulocyte fraction was 23.3%.  PSA of 8.1. DAT, IgG was negative, DAT complement negative.  Patient neutropenic with an ANC of 0.9.  Monitor daily labs.  3.  Castration sensitive prostate cancer with lymphadenopathy Patient has been receiving Lupron injections every 4 months.  Next injection scheduled for February 2020.  Per oncology.  4.  Stage III mantle cell lymphoma diagnosed in December 2018 Per patient's oncologist note from 05/05/2018 patient currently off therapy at this time after completing a course of ibrutinib with complete response.  Treatment discontinued secondary to pancytopenia.  Oncology following.   5.  Severe protein calorie malnutrition Secondary to problems #3 and 4.  Continue Ensure.  Nutritional supplementation.  6.   Klebsiella pneumonia UTI - Urinalysis was concerning for possible UTI.  Patient was pancytopenic and also neutropenic with an ANC of 0.9.  Due to immunocompromised state, patient was empirically started on IV Rocephin.  Urine cultures consistent with Klebsiella pneumonia greater than 100,000 colonies.  Currently remains on IV Rocephin.  Plan to switch to oral Keflex at discharge.  VTE Prophylaxis: SCD boots    Code Status: Full Code   Family Communication: daughter present at bedside.  Disposition Plan: Likely home tomorrow with home  health.  Antibiotics: Antibiotics Given (last 72 hours)    Date/Time Action Medication Dose Rate   05/19/18 0840 New Bag/Given   cefTRIAXone (ROCEPHIN) 1 g in sodium chloride 0.9 % 100 mL IVPB 1 g 200 mL/hr   05/20/18 1305 New Bag/Given   cefTRIAXone (ROCEPHIN) 1 g in sodium chloride 0.9 % 100 mL IVPB 1 g 200 mL/hr      Continuous Infusions:  . pantoprozole (PROTONIX) infusion 8 mg/hr (05/21/18 1251)    Scheduled Meds: . cephALEXin  500 mg Oral Q12H  . feeding supplement (ENSURE ENLIVE)  237 mL Oral BID BM  . mouth rinse  15 mL Mouth Rinse BID  . [START ON 05/23/2018] pantoprazole  40 mg Intravenous Q12H  . senna-docusate  1 tablet Oral BID    PRN meds: acetaminophen **OR** acetaminophen, ondansetron **OR** ondansetron (ZOFRAN) IV   Subjective: Patient was seen and examined this morning.  Patient elderly African-American male.  Cachectic.  Son and daughter at bedside.  Objective: Vitals:   05/21/18 1012 05/21/18 1022  BP: (!) 95/53 (!) 107/53  Pulse: 74 76  Resp: 16 16  Temp: 97.8 F (36.6 C)   SpO2: 100% 100%    Intake/Output Summary (Last 24 hours) at 05/21/2018 1310 Last data filed at 05/21/2018 1004 Gross per 24 hour  Intake 1912.15 ml  Output 2700 ml  Net -787.85 ml   Filed Weights   05/16/18 1722 05/21/18 0926  Weight: 52.6 kg 50 kg   Body mass index is 17.26 kg/m.   Physical Exam: GENERAL: Patient elderly African-American male, cachectic HENT: No scleral pallor or icterus. Pupils equally reactive to light. Oral mucosa is moist NECK: is supple, no palpable thyroid enlargement. CHEST: Clear to auscultation. No crackles or wheezes. Non tender on palpation. Diminished breath sounds bilaterally. CVS: S1 and S2 heard, no murmur. Regular rate and rhythm. No pericardial rub. ABDOMEN: Soft, non-tender, bowel sounds are present. No palpable hepato-splenomegaly. EXTREMITIES: No edema. CNS: Alert, awake, oriented to place and person SKIN: warm and dry without  rashes.  Data Review: I have personally reviewed the laboratory data and studies available.  Terrilee Croak, MD  [facility] 05/21/2018

## 2018-05-21 NOTE — Progress Notes (Signed)
PT Cancellation Note  Patient Details Name: Joel [name] MRN: [id number] DOB: 01-12-1941   Cancelled Treatment:    Reason Eval/Treat Not Completed: Patient at procedure or test/unavailable earlier today. Will check back another time   Claretha Cooper 05/21/2018, 5:07 PM [place] Pager [phone] Office [phone]

## 2018-05-21 NOTE — Op Note (Signed)
[facility] Patient Name: Joel [name] Procedure Date: 05/21/2018 MRN: [id number] Attending MD: Milus Banister , MD Date of Birth: [birth year] CSN: [id number] Age: 78 Admit Type: Inpatient Procedure:                Upper GI endoscopy Indications:              Severe anemia, Melena; in setting of bone marrow                            fibrosis, pancytopenia, Mantle Cell Lymphoma. Has                            received 7 units PRBs and 2 units plts in past 5                            days Providers:                Milus Banister, MD, Cleda Daub, RN, Jeanella Cara, RN, William Dalton, Technician Referring MD:              Medicines:                Monitored Anesthesia Care Complications:            No immediate complications. Estimated blood loss:                            None. Estimated Blood Loss:     Estimated blood loss: none. Procedure:                Pre-Anesthesia Assessment:                           - Prior to the procedure, a History and Physical                            was performed, and patient medications and                            allergies were reviewed. The patient's tolerance of                            previous anesthesia was also reviewed. The risks                            and benefits of the procedure and the sedation                            options and risks were discussed with the patient.                            All questions were answered, and informed consent  was obtained. Prior Anticoagulants: The patient has                            taken no previous anticoagulant or antiplatelet                            agents. ASA Grade Assessment: IV - A patient with                            severe systemic disease that is a constant threat                            to life. After reviewing the risks and benefits,                            the patient was  deemed in satisfactory condition to                            undergo the procedure.                           After obtaining informed consent, the endoscope was                            passed under direct vision. Throughout the                            procedure, the patient's blood pressure, pulse, and                            oxygen saturations were monitored continuously. The                            GIF-H190 ([id number]) Olympus gastroscope was                            introduced through the mouth, and advanced to the                            second part of duodenum. The upper GI endoscopy was                            accomplished without difficulty. The patient                            tolerated the procedure well. Scope In: Scope Out: Findings:      There was no blood in the UGI tract.      Very minimal inflammation characterized by erythema and granularity was       found in the gastric antrum, body and duodenal bulb. Biopsies were taken       from the antrum and body with a cold forceps for histology.      Two small, non-bleeding AVMs were noted along the incisure. It is not  clear if these were the source of his recent melene but to be safe I       elected to treat them with application of APC. Neither bled during       treatment.      The exam was otherwise without abnormality. Impression:               - Mild, non-specific gastritis and bulbar                            duodenitis. Biopsies taken from the stomach to                            check for H. pylori.                           - Two small, non-bleeding gastric AVMs were noted                            and treated with application of APC. Moderate Sedation:      Not Applicable - Patient had care per Anesthesia. Recommendation:           - Observe another 1-2 days, if he has recurrent                            overt bleeding then will need to consider                            colonoscopy.  He has never had one and it is not too                            uncommon for colonic bleeding to cause melena,                            especially from a very proximal colon site.                           - Will advance diet for now. Procedure Code(s):        --- Professional ---                           63785, 59, Esophagogastroduodenoscopy, flexible,                            transoral; with control of bleeding, any method                           43239, Esophagogastroduodenoscopy, flexible,                            transoral; with biopsy, single or multiple Diagnosis Code(s):        --- Professional ---                           K29.70, Gastritis, unspecified, without bleeding  D62, Acute posthemorrhagic anemia                           K92.1, Melena (includes Hematochezia) CPT copyright 2018 American Medical Association. All rights reserved. The codes documented in this report are preliminary and upon coder review may  be revised to meet current compliance requirements. Milus Banister, MD 05/21/2018 10:14:27 AM This report has been signed electronically. Number of Addenda: 0

## 2018-05-21 NOTE — Progress Notes (Signed)
Events noted.  Patient received packed red cell transfusion as well as platelet transfusion with appropriate response.  Hemoglobin improved to 8.8 and a platelet count of 94.  He did have a large dark stool noted during the day yesterday.  He is scheduled to have EGD this morning and appears that his platelets are adequate to do so.  Overall Joel [name] has been clinically stable.  I would recommend continued supportive management at this time.  No additional treatment for his malignancy is required at this time given his acute illness and possible GI bleeding.  We will continue to follow with you.

## 2018-05-21 NOTE — Transfer of Care (Signed)
Immediate Anesthesia Transfer of Care Note  Patient: Joel [name]  Procedure(s) Performed: ESOPHAGOGASTRODUODENOSCOPY (EGD) WITH PROPOFOL (N/A ) BIOPSY HOT HEMOSTASIS (ARGON PLASMA COAGULATION/BICAP) (N/A )  Patient Location: Endoscopy Unit  Anesthesia Type:MAC  Level of Consciousness: drowsy  Airway & Oxygen Therapy: Patient Spontanous Breathing and Patient connected to nasal cannula oxygen  Post-op Assessment: Report given to RN and Post -op Vital signs reviewed and stable  Post vital signs: Reviewed and stable  Last Vitals:  Vitals Value Taken Time  BP    Temp    Pulse 74 05/21/2018 10:12 AM  Resp 16 05/21/2018 10:12 AM  SpO2 100 % 05/21/2018 10:12 AM  Vitals shown include unvalidated device data.  Last Pain:  Vitals:   05/21/18 0926  TempSrc: Oral  PainSc: 0-No pain      Patients Stated Pain Goal: 0 (03/88/82 8003)  Complications: No apparent anesthesia complications

## 2018-05-21 NOTE — Anesthesia Procedure Notes (Signed)
Procedure Name: MAC Date/Time: 05/21/2018 9:48 AM Performed by: Claudia Desanctis, CRNA Pre-anesthesia Checklist: Patient identified, Emergency Drugs available, Suction available and Patient being monitored Oxygen Delivery Method: Nasal cannula

## 2018-05-21 NOTE — Interval H&P Note (Signed)
History and Physical Interval Note:  05/21/2018 9:23 AM  Joel [name]  has presented today for surgery, with the diagnosis of Melena, upper GI bleeding  The various methods of treatment have been discussed with the patient and family. After consideration of risks, benefits and other options for treatment, the patient has consented to  Procedure(s): ESOPHAGOGASTRODUODENOSCOPY (EGD) WITH PROPOFOL (N/A) as a surgical intervention .  The patient's history has been reviewed, patient examined, no change in status, stable for surgery.  I have reviewed the patient's chart and labs.  Questions were answered to the patient's satisfaction.     Milus Banister

## 2018-05-22 ENCOUNTER — Encounter (HOSPITAL_COMMUNITY): Payer: Self-pay | Admitting: Gastroenterology

## 2018-05-22 DIAGNOSIS — K2970 Gastritis, unspecified, without bleeding: Secondary | ICD-10-CM

## 2018-05-22 DIAGNOSIS — K31819 Angiodysplasia of stomach and duodenum without bleeding: Secondary | ICD-10-CM

## 2018-05-22 DIAGNOSIS — K2990 Gastroduodenitis, unspecified, without bleeding: Secondary | ICD-10-CM

## 2018-05-22 LAB — CULTURE, BLOOD (ROUTINE X 2)
CULTURE: NO GROWTH
Culture: NO GROWTH
Special Requests: ADEQUATE
Special Requests: ADEQUATE

## 2018-05-22 LAB — CBC WITH DIFFERENTIAL/PLATELET
Abs Immature Granulocytes: 0.31 10*3/uL — ABNORMAL HIGH (ref 0.00–0.07)
Basophils Absolute: 0 10*3/uL (ref 0.0–0.1)
Basophils Relative: 1 %
Eosinophils Absolute: 0 10*3/uL (ref 0.0–0.5)
Eosinophils Relative: 1 %
HCT: 27.4 % — ABNORMAL LOW (ref 39.0–52.0)
Hemoglobin: 8.9 g/dL — ABNORMAL LOW (ref 13.0–17.0)
Immature Granulocytes: 14 %
Lymphocytes Relative: 17 %
Lymphs Abs: 0.4 10*3/uL — ABNORMAL LOW (ref 0.7–4.0)
MCH: 28 pg (ref 26.0–34.0)
MCHC: 32.5 g/dL (ref 30.0–36.0)
MCV: 86.2 fL (ref 80.0–100.0)
MONOS PCT: 7 %
Monocytes Absolute: 0.2 10*3/uL (ref 0.1–1.0)
Neutro Abs: 1.3 10*3/uL — ABNORMAL LOW (ref 1.7–7.7)
Neutrophils Relative %: 60 %
Platelets: 75 10*3/uL — ABNORMAL LOW (ref 150–400)
RBC: 3.18 MIL/uL — ABNORMAL LOW (ref 4.22–5.81)
RDW: 14.7 % (ref 11.5–15.5)
WBC: 2.2 10*3/uL — ABNORMAL LOW (ref 4.0–10.5)
nRBC: 4.5 % — ABNORMAL HIGH (ref 0.0–0.2)

## 2018-05-22 MED ORDER — PANTOPRAZOLE SODIUM 40 MG PO TBEC
40.0000 mg | DELAYED_RELEASE_TABLET | Freq: Two times a day (BID) | ORAL | Status: DC
  Administered 2018-05-22 – 2018-05-27 (×10): 40 mg via ORAL
  Filled 2018-05-22 (×10): qty 1

## 2018-05-22 NOTE — Care Management Important Message (Signed)
Important Message  Patient Details  Name: Kasson Lamere MRN: [id number] Date of Birth: 1940-04-26   Medicare Important Message Given:  Yes    [name] 05/22/2018, 9:20 AMImportant Message  Patient Details  Name: Tandy Lewin MRN: [id number] Date of Birth: 12-31-1940   Medicare Important Message Given:  Yes    [name] 05/22/2018, 9:20 AM

## 2018-05-22 NOTE — Care Management (Signed)
This CM met with pt and spouse for dc planning. HHPT recommendation from PT eval shared with pt and wife. Pt states they have had [facility] in the past for home health services and do not want them again. Medicare rating list of home health providers left with wife. Wife to decide after talking with daughter this evening. CM to check back for choice. Marney Doctor RN,BSN [phone]

## 2018-05-22 NOTE — Progress Notes (Signed)
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 Patient was noted to have a hemoglobin of 3.1 in the ED. Patient did endorse black stool 3 days prior to admission however none since then. Patient also noted to be severely pancytopenic. Patient transfused 4 units of packed red blood cells initially then another 3 units.GI consulted.   Assessment & Plan:   Principal Problem:   Gastrointestinal hemorrhage with melena Active Problems:   Protein-calorie malnutrition, severe (HCC)   Mantle cell lymphoma of lymph nodes of head, face, and neck (HCC)   Prostate cancer (HCC)   Pancytopenia (HCC)   Symptomatic anemia   Hypothermia   Thrombocytopenia (HCC)   UTI due to Klebsiella species   Gastritis and gastroduodenitis   AVM (arteriovenous malformation) of stomach, acquired  Symptomatic anemia.  Acute blood loss anemia due to GI bleeding: Presented with symptomatic anemia.  Received total of 7 units of PRBC.  Underwent EGD on 05/21/2018.  Found to have mild nonspecific gastritis and bulbar duodenitis.  Patient also had nonbleeding gastric AVMs that were treated with APC.  Continue on oral PPI twice daily.  Since last 24 hours, his hemoglobin has remained stable.  As per GI recommendation will recheck hemoglobin tomorrow morning to ensure stabilization's.  If any drop, will need inpatient colonoscopy.  If remains a stable planning to go home with outpatient colonoscopy and follow-up.  Severe pancytopenia: Related to  secondary to fibrosis and involvement of bone marrow with mantle cell lymphoma.  His platelets and WBC counts are fairly stable today.  Will need close monitoring.  Stage III mantle cell lymphoma: Patient is currently off therapy.  Oncology following.  Severe protein calorie malnutrition: Continue supplement nutrition with Ensure.  Followed by dietitian.  Klebsiella pneumoniae UTI: Patient was treated with IV Rocephin.  Currently on oral Keflex.  Total 7 days of therapy is recommended.  Day 4/7.   DVT prophylaxis: SCDs. Code Status: Full code. Family Communication: Son and daughter at the bedside. Disposition Plan: Home with home health care.  Anticipate tomorrow if hemoglobin remains a stable.   Consultants:   Gastroenterology  Oncology  Procedures:   EGD, 05/21/2018 with gastritis and AVM not actively bleeding.  Antimicrobials:   Keflex day 4/7   Subjective: Patient was seen and examined.  Family were at the bedside.  He had slightly dark stool last night that cleared up later on.  Denies any nausea or vomiting.  No abdominal pain.  Objective: Vitals:   05/22/18 0307 05/22/18 0502 05/22/18 1000 05/22/18 1441  BP: (!) 146/86 (!) 137/91 114/84 135/85  Pulse: 84 80 89 80  Resp: 20 16 16 17   Temp: 98.3 F (36.8 C) 98.1 F (36.7 C) (!) 97.5 F (36.4 C) 99.4 F (37.4 C)  TempSrc: Oral Oral Oral Oral  SpO2: 98% 99% 98% 99%  Weight:      Height:        Intake/Output Summary (Last 24 hours) at 05/22/2018 1544 Last data filed at 05/22/2018 0430 Gross per 24 hour  Intake -  Output 300 ml  Net -300  ml   Filed Weights   05/16/18 1722 05/21/18 0926  Weight: 52.6 kg 50 kg    Examination:  General exam: Appears calm and comfortable  Chronically sick looking but not in any distress.  Cachectic. Respiratory system: Clear to auscultation. Respiratory effort normal. Cardiovascular system: S1 & S2 heard, RRR. No JVD, murmurs, rubs, gallops or clicks. No pedal  edema. Gastrointestinal system: Abdomen is nondistended, soft and nontender. No organomegaly or masses felt. Normal bowel sounds heard. Central nervous system: Alert and oriented. No focal neurological deficits. Extremities: Symmetric 5 x 5 power. Skin: No rashes, lesions or ulcers Psychiatry: Judgement and insight appear normal. Mood & affect appropriate.     Data Reviewed: I have personally reviewed following labs and imaging studies  CBC: Recent Labs  Lab 05/18/18 0317 05/19/18 0259 05/20/18 0513 05/21/18 0609 05/22/18 0349  WBC 1.5* 1.7* 2.8* 2.8* 2.2*  NEUTROABS 0.9* 0.9* 1.5* 0.9* 1.3*  HGB 8.4* 8.6* 5.7* 8.8* 8.9*  HCT 26.0* 26.6* 18.4* 26.8* 27.4*  MCV 85.5 86.1 88.0 85.4 86.2  PLT 28* 24* 19* 94* 75*   Basic Metabolic Panel: Recent Labs  Lab 05/17/18 0237 05/18/18 0317 05/19/18 0259 05/20/18 0513 05/21/18 0609  NA 145 141 144 142 144  K 3.9 3.7 3.3* 4.4 3.5  CL 114* 112* 113* 112* 112*  CO2 21* 21* 23 21* 24  GLUCOSE 110* 128* 114* 134* 97  BUN 61* 44* 33* 47* 38*  CREATININE 1.19 1.17 1.22 1.19 1.31*  CALCIUM 8.1* 8.0* 8.4* 8.1* 8.6*   GFR: Estimated Creatinine Clearance: 34.5 mL/min (A) (by C-G formula based on SCr of 1.31 mg/dL (H)). Liver Function Tests: Recent Labs  Lab 05/17/18 0237 05/18/18 0317 05/19/18 0259 05/20/18 0513 05/21/18 0609  AST 25 24 17 21 22   ALT 17 12 15 14 14   ALKPHOS 52 50 46 45 53  BILITOT 0.7 0.8 0.5 0.4 0.7  PROT 5.1* 5.2* 5.3* 4.5* 5.2*  ALBUMIN 2.4* 2.3* 2.3* 2.0* 2.3*   No results for input(s): LIPASE, AMYLASE in the last 168 hours. No results for input(s): AMMONIA in the last 168 hours. Coagulation Profile: No results for input(s): INR, PROTIME in the last 168 hours. Cardiac Enzymes: No results for input(s): CKTOTAL, CKMB, CKMBINDEX, TROPONINI in the last 168 hours. BNP (last 3 results) No results for input(s): PROBNP in the last 8760 hours. HbA1C: No results for input(s): HGBA1C in the last 72  hours. CBG: Recent Labs  Lab 05/16/18 1807 05/20/18 0338  GLUCAP 96 127*   Lipid Profile: No results for input(s): CHOL, HDL, LDLCALC, TRIG, CHOLHDL, LDLDIRECT in the last 72 hours. Thyroid Function Tests: No results for input(s): TSH, T4TOTAL, FREET4, T3FREE, THYROIDAB in the last 72 hours. Anemia Panel: No results for input(s): VITAMINB12, FOLATE, FERRITIN, TIBC, IRON, RETICCTPCT in the last 72 hours. Sepsis Labs: No results for input(s): PROCALCITON, LATICACIDVEN in the last 168 hours.  Recent Results (from the past 240 hour(s))  MRSA PCR Screening     Status: None   Collection Time: 05/16/18  5:35 PM  Result Value Ref Range Status   MRSA by PCR NEGATIVE NEGATIVE Final    Comment:        The GeneXpert MRSA Assay (FDA approved for NASAL specimens only), is one component of a comprehensive MRSA colonization surveillance program. It is not intended to diagnose MRSA infection nor to guide or monitor treatment for MRSA infections. Performed at [facility], [place] [address], [place], [place] [address]   Culture,  Urine     Status: Abnormal   Collection Time: 05/17/18  7:58 AM  Result Value Ref Range Status   Specimen Description   Final    URINE, RANDOM Performed at [facility] [address], [place], [place] [address]    Special Requests   Final    NONE Performed at [facility], [place] [address] [place], [place] [address]    Culture >=100,000 COLONIES/mL KLEBSIELLA PNEUMONIAE (A)  Final   Report Status 05/20/2018 FINAL  Final   Organism ID, Bacteria KLEBSIELLA PNEUMONIAE (A)  Final      Susceptibility   Klebsiella pneumoniae - MIC*    AMPICILLIN >=32 RESISTANT Resistant     CEFAZOLIN <=4 SENSITIVE Sensitive     CEFTRIAXONE <=1 SENSITIVE Sensitive     CIPROFLOXACIN <=0.25 SENSITIVE Sensitive     GENTAMICIN <=1 SENSITIVE Sensitive     IMIPENEM <=0.25 SENSITIVE Sensitive     NITROFURANTOIN 32 SENSITIVE  Sensitive     TRIMETH/SULFA <=20 SENSITIVE Sensitive     AMPICILLIN/SULBACTAM >=32 RESISTANT Resistant     PIP/TAZO 8 SENSITIVE Sensitive     Extended ESBL NEGATIVE Sensitive     * >=100,000 COLONIES/mL KLEBSIELLA PNEUMONIAE  Culture, blood (Routine X 2) w Reflex to ID Panel     Status: None   Collection Time: 05/17/18  9:13 AM  Result Value Ref Range Status   Specimen Description   Final    BLOOD RIGHT HAND Performed at [facility] [address], [place], [place] [address]    Special Requests   Final    BOTTLES DRAWN AEROBIC ONLY Blood Culture adequate volume Performed at [facility] [address], [place], [place] [address]    Culture   Final    NO GROWTH 5 DAYS Performed at [facility], [place] [address], [place], [place] [address]    Report Status 05/22/2018 FINAL  Final  Culture, blood (Routine X 2) w Reflex to ID Panel     Status: None   Collection Time: 05/17/18  9:13 AM  Result Value Ref Range Status   Specimen Description   Final    BLOOD LEFT HAND Performed at [facility] [address] [place], [place] [address]    Special Requests   Final    BOTTLES DRAWN AEROBIC ONLY Blood Culture adequate volume Performed at [facility] [address] [place], [place] [address]    Culture   Final    NO GROWTH 5 DAYS Performed at [facility], [place] [address] [place],  [address]    Report Status 05/22/2018 FINAL  Final         Radiology Studies: No results found.      Scheduled Meds: . cephALEXin  500 mg Oral Q12H  . feeding supplement (ENSURE ENLIVE)  237 mL Oral BID BM  . mouth rinse  15 mL Mouth Rinse BID  . pantoprazole  40 mg Oral BID  . senna-docusate  1 tablet Oral BID   Continuous Infusions:   LOS: 3 days    Time spent: 25 minutes     Barb Merino, MD [facility] Pager [phone]  If 7PM-7AM, please contact  night-coverage [email or web address] Password [facility] 05/22/2018, 3:44 PM

## 2018-05-22 NOTE — Anesthesia Postprocedure Evaluation (Signed)
Anesthesia Post Note  Patient: Joel [name]  Procedure(s) Performed: ESOPHAGOGASTRODUODENOSCOPY (EGD) WITH PROPOFOL (N/A ) BIOPSY HOT HEMOSTASIS (ARGON PLASMA COAGULATION/BICAP) (N/A )     Patient location during evaluation: PACU Anesthesia Type: MAC Level of consciousness: awake and alert Pain management: pain level controlled Vital Signs Assessment: post-procedure vital signs reviewed and stable Respiratory status: spontaneous breathing Cardiovascular status: stable Anesthetic complications: no    Last Vitals:  Vitals:   05/22/18 0502 05/22/18 1000  BP: (!) 137/91 114/84  Pulse: 80 89  Resp: 16 16  Temp: 36.7 C (!) 36.4 C  SpO2: 99% 98%    Last Pain:  Vitals:   05/22/18 1000  TempSrc: Oral  PainSc:                  Nolon Nations

## 2018-05-22 NOTE — Progress Notes (Signed)
Physical Therapy Treatment Patient Details Name: Joel [name] MRN: [id number] DOB: [birth year] Today's Date: 05/22/2018    History of Present Illness Pt is a 78 y.o. M with significant PMH of stroke, mantle cell lymphoma, prostate CA, anemia, CKD-4, who presents with critical Hgb 2.9. and generalized weakness.     PT Comments    Pt feeling better.  Assisted OOB to amb a great distance in hallway.  Amb with walker and without. General Gait Details: gait is steady with RW, Ambulated x 20' with light 1 HHA without.     Follow Up Recommendations  Home health PT     Equipment Recommendations  Rolling walker with 5" wheels    Recommendations for Other Services       Precautions / Restrictions Precautions Precautions: Fall Restrictions Weight Bearing Restrictions: No    Mobility  Bed Mobility Overal bed mobility: Modified Independent             General bed mobility comments: increased time  Transfers Overall transfer level: Needs assistance Equipment used: None Transfers: Sit to/from Stand;Stand Pivot Transfers Sit to Stand: Supervision Stand pivot transfers: Supervision       General transfer comment: good safety tech and use of hands to steady self  Ambulation/Gait Ambulation/Gait assistance: Min guard;Supervision Gait Distance (Feet): 280 Feet Assistive device: Rolling walker (2 wheeled);None Gait Pattern/deviations: Step-through pattern Gait velocity: decreased    General Gait Details: gait is steady with RW, Ambulated x 20' with light 1 HHA without    Stairs             Wheelchair Mobility    Modified Rankin (Stroke Patients Only)       Balance                                            Cognition Arousal/Alertness: Awake/alert Behavior During Therapy: WFL for tasks assessed/performed Overall Cognitive Status: Within Functional Limits for tasks assessed Area of Impairment: Memory;Safety/judgement                      Memory: Decreased short-term memory   Safety/Judgement: Decreased awareness of safety;Decreased awareness of deficits            Exercises General Exercises - Upper Extremity Shoulder Flexion: Both;10 reps;Theraband Theraband Level (Shoulder Flexion): Level 1 (Yellow) Shoulder ABduction: Both;10 reps;Theraband Theraband Level (Shoulder Abduction): Level 1 (Yellow) Shoulder ADduction: Both;10 reps;Theraband Theraband Level (Shoulder Adduction): Level 1 (Yellow) Shoulder Horizontal ABduction: Both;10 reps;Theraband Theraband Level (Shoulder Horizontal Abduction): Level 1 (Yellow) Shoulder Horizontal ADduction: Both;10 reps;AROM;Theraband Theraband Level (Shoulder Horizontal Adduction): Level 1 (Yellow)    General Comments        Pertinent Vitals/Pain Pain Assessment: No/denies pain    Home Living                      Prior Function            PT Goals (current goals can now be found in the care plan section) Acute Rehab PT Goals Patient Stated Goal: to go home Progress towards PT goals: Progressing toward goals    Frequency    Min 3X/week      PT Plan Current plan remains appropriate    Co-evaluation              AM-PAC PT "6 Clicks" Mobility   Outcome Measure  Help needed turning from your back to your side while in a flat bed without using bedrails?: A Little Help needed moving from lying on your back to sitting on the side of a flat bed without using bedrails?: A Little Help needed moving to and from a bed to a chair (including a wheelchair)?: A Little Help needed standing up from a chair using your arms (e.g., wheelchair or bedside chair)?: A Little Help needed to walk in hospital room?: A Little Help needed climbing 3-5 steps with a railing? : A Little 6 Click Score: 18    End of Session Equipment Utilized During Treatment: Gait belt Activity Tolerance: Patient tolerated treatment well Patient left: in bed;with call  bell/phone within reach;with family/visitor present   PT Visit Diagnosis: Unsteadiness on feet (R26.81);Muscle weakness (generalized) (M62.81)     Time: 8309-4076 PT Time Calculation (min) (ACUTE ONLY): 18 min  Charges:  $Gait Training: 8-22 mins                     Rica Koyanagi  PTA Acute  Rehabilitation Services Pager      [phone] Office      [phone]

## 2018-05-22 NOTE — Progress Notes (Signed)
     [facility] Progress Note   Chief Complaint:   Anemia / heme positive stool    SUBJECTIVE:    had a black stool yesterday after EGD, no BMs since.    ASSESSMENT AND PLAN:    1. 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 He has been getting palliative radiation. Off chemo due to pancytopenia. Heme/Onc recommended BM bx to check for Mantle Cell lymphoma involvement with bone marrow. Biopsy was supposed to be done today but patient refused pre procedure labs so procedure cancelled.  2. Profound anemia (hgb 2.8 grams) / heme positive stools / black stools prior to admission. He has required several units of blood this admission. Got another 3 units of blood 05/20/18 after another acute decline in hgb in absence of active bleeding. Hgb rose appropriately by ~ 3 grams, up to 8.9 today.  -EGD yesterday revealing of gastritis / duodenitis / two small gastric AVMs which were treated with APC. Gastric and duodenal bx pending.  -Had a black stool AFTER EGD yesterday, no BMs since. Over next couple of days if he has  active bleeding / decline in hgb then will prep for colonoscopy. Patient agreeable to this plan.    OBJECTIVE:    Vital signs in last 24 hours: Temp:  [97.8 F (36.6 C)-98.5 F (36.9 C)] 98.1 F (36.7 C) (01/30 0502) Pulse Rate:  [74-96] 80 (01/30 0502) Resp:  [16-22] 16 (01/30 0502) BP: (95-146)/(53-91) 137/91 (01/30 0502) SpO2:  [98 %-100 %] 99 % (01/30 0502) Weight:  [50 kg] 50 kg (01/29 0926) Last BM Date: 05/21/18 General:   Alert, thin malein NAD. Completed 100% of breakfast EENT:  Normal hearing, non icteric sclera, conjunctive pink.  Heart:  Regular rate and rhythm.  No lower extremity edema   Pulm: Normal respiratory effort. Abdomen:  Soft, nondistended, nontender.  Normal bowel sounds, no masses felt.       Neurologic:  Alert and  oriented x4;  grossly normal neurologically. Psych:  Pleasant,  cooperative.  Normal mood and affect.   Intake/Output from previous day: 01/29 0701 - 01/30 0700 In: 660 [P.O.:360; I.V.:300] Out: 600 [Urine:600] Intake/Output this shift: No intake/output data recorded.  Lab Results: Recent Labs    05/20/18 0513 05/21/18 0609 05/22/18 0349  WBC 2.8* 2.8* 2.2*  HGB 5.7* 8.8* 8.9*  HCT 18.4* 26.8* 27.4*  PLT 19* 94* 75*   BMET Recent Labs    05/20/18 0513 05/21/18 0609  NA 142 144  K 4.4 3.5  CL 112* 112*  CO2 21* 24  GLUCOSE 134* 97  BUN 47* 38*  CREATININE 1.19 1.31*  CALCIUM 8.1* 8.6*   LFT Recent Labs    05/21/18 0609  PROT 5.2*  ALBUMIN 2.3*  AST 22  ALT 14  ALKPHOS 44  BILITOT 0.7     Principal Problem:   Gastrointestinal hemorrhage with melena Active Problems:   Protein-calorie malnutrition, severe (HCC)   Mantle cell lymphoma of lymph nodes of head, face, and neck (HCC)   Prostate cancer (HCC)   Pancytopenia (HCC)   Symptomatic anemia   Hypothermia   Thrombocytopenia (HCC)   UTI due to Klebsiella species   Gastritis and gastroduodenitis   AVM (arteriovenous malformation) of stomach, acquired     LOS: 3 days   Tye Savoy ,NP 05/22/2018, 8:58 AM

## 2018-05-22 NOTE — Progress Notes (Addendum)
Occupational Therapy Treatment Patient Details Name: Joel [name] MRN: [id number] DOB: 08/13/1940 Today's Date: 05/22/2018    History of present illness Pt is a 78 y.o. M with significant PMH of stroke, mantle cell lymphoma, prostate CA, anemia, CKD-4, who presents with critical Hgb 2.9. and generalized weakness.    OT comments  Pt is pleasant and agreeable to working with OT today. Issued pt level 1 theraband and educated pt in Joel [name] for continued strengthening and endurance as precursor to ADL and completion of functional transfer. Pt return demonstrating with min cues throughout and taking intermittent rest breaks PRN. Pt spouse present and supportive throughout. Will continue per POC.   Follow Up Recommendations  Home health OT    Equipment Recommendations  3 in 1 bedside commode          Precautions / Restrictions Precautions Precautions: Fall Restrictions Weight Bearing Restrictions: No                                                                         ADL either performed or assessed with clinical judgement   ADL Overall ADL's : Needs assistance/impaired   Eating/Feeding Details (indicate cue type and reason): encouraged PO intake, spouse present and also doing so                                   General ADL Comments: issued pt level 1 theraband and educated in UB exercise; pt agreeable and participative                       Cognition Arousal/Alertness: Awake/alert Behavior During Therapy: WFL for tasks assessed/performed Overall Cognitive Status: Impaired/Different from baseline Area of Impairment: Memory;Safety/judgement                     Memory: Decreased short-term memory   Safety/Judgement: Decreased awareness of safety;Decreased awareness of deficits              Exercises Exercises: General Upper Extremity General Exercises - Upper Extremity Shoulder Flexion: Both;10  reps;Theraband Theraband Level (Shoulder Flexion): Level 1 (Yellow) Shoulder ABduction: Both;10 reps;Theraband Theraband Level (Shoulder Abduction): Level 1 (Yellow) Shoulder ADduction: Both;10 reps;Theraband Theraband Level (Shoulder Adduction): Level 1 (Yellow) Shoulder Horizontal ABduction: Both;10 reps;Theraband Theraband Level (Shoulder Horizontal Abduction): Level 1 (Yellow) Shoulder Horizontal ADduction: Both;10 reps;AROM;Theraband Theraband Level (Shoulder Horizontal Adduction): Level 1 (Yellow)   Shoulder Instructions       General Comments      Pertinent Vitals/ Pain       Pain Assessment: No/denies pain  Home Living                                          Prior Functioning/Environment              Frequency  Min 2X/week        Progress Toward Goals  OT Goals(current goals can now be found in the care plan section)  Progress towards OT goals: Progressing toward goals  Acute Rehab OT Goals Patient Stated  Goal: to go home OT Goal Formulation: With patient Time For Goal Achievement: 06/02/18 Potential to Achieve Goals: Good  Plan Discharge plan remains appropriate    Co-evaluation                 AM-PAC OT "6 Clicks" Daily Activity     Outcome Measure   Help from another person eating meals?: None Help from another person taking care of personal grooming?: None Help from another person toileting, which includes using toliet, bedpan, or urinal?: A Little Help from another person bathing (including washing, rinsing, drying)?: A Little Help from another person to put on and taking off regular upper body clothing?: A Little Help from another person to put on and taking off regular lower body clothing?: A Little 6 Click Score: 20    End of Session Equipment Utilized During Treatment: Other (comment)(theraband)  OT Visit Diagnosis: Muscle weakness (generalized) (M62.81)   Activity Tolerance Patient tolerated treatment  well   Patient Left with call bell/phone within reach;with family/visitor present;in bed   Nurse Communication Mobility status        Time: 7948-0165 OT Time Calculation (min): 21 min  Charges: OT General Charges $OT Visit: 1 Visit OT Treatments $Therapeutic Activity: 8-22 mins  Lou Cal, OT Supplemental Rehabilitation Services Pager [phone] Office [phone]    [name] 05/22/2018, 3:56 PM

## 2018-05-22 NOTE — Progress Notes (Signed)
IP PROGRESS NOTE  Subjective:     [name] feels okay this morning without any complaints.  He tolerated endoscopy without any issues.  He did have one dark stool after his procedure but none since.  He has eaten some food but not significantly.  He does report some fatigue but felt stronger after transfusion.  He denied chest pain or difficulty breathing.  Denied any cough or wheezing.  Objective:  Vital signs in last 24 hours: Temp:  [97.8 F (36.6 C)-98.5 F (36.9 C)] 98.1 F (36.7 C) (01/30 0502) Pulse Rate:  [74-96] 80 (01/30 0502) Resp:  [16-22] 16 (01/30 0502) BP: (95-146)/(53-91) 137/91 (01/30 0502) SpO2:  [98 %-100 %] 99 % (01/30 0502) Weight:  [110 lb 3.7 oz (50 kg)] 110 lb 3.7 oz (50 kg) (01/29 0926) Weight change:  Last BM Date: 05/21/18  Intake/Output from previous day: 01/29 0701 - 01/30 0700 In: 660 [P.O.:360; I.V.:300] Out: 600 [Urine:600]    General appearance: Alert, awake without any distress. Head: Atraumatic without abnormalities Oropharynx: Without any thrush or ulcers. Eyes: No scleral icterus. Lymph nodes: No lymphadenopathy noted in the cervical, supraclavicular, or axillary nodes Heart:regular rate and rhythm, without any murmurs or gallops.   Lung: Clear to auscultation without any rhonchi, wheezes or dullness to percussion. Abdomin: Soft, nontender without any shifting dullness or ascites. Musculoskeletal: No clubbing or cyanosis. Neurological: No motor or sensory deficits. Skin: No rashes or lesions.      Lab Results: Recent Labs    05/21/18 0609 05/22/18 0349  WBC 2.8* 2.2*  HGB 8.8* 8.9*  HCT 26.8* 27.4*  PLT 94* 75*    BMET Recent Labs    05/20/18 0513 05/21/18 0609  NA 142 144  K 4.4 3.5  CL 112* 112*  CO2 21* 24  GLUCOSE 134* 97  BUN 47* 38*  CREATININE 1.19 1.31*  CALCIUM 8.1* 8.6*    Studies/Results: No results found.  Medications: I have reviewed the patient's current  medications.  Assessment/Plan:  78 year old man with:   1.  Pancytopenia: Due to to lymphoma and lymphoma treatment.  Possible prostate cancer involvement of his bone marrow is a possibility.  His counts appear to be relatively stable at this time and does not require any transfusion.  He will require supportive transfusion in the future as needed.  2.  Melena: Status post endoscopy which showed 2 AVMs that are not bleeding.  These have been cauterized.  His hemoglobin remained stable overnight.  3.  Prognosis: Overall poor but his performance status remains adequate and additional treatment may be needed but likely will be predominantly supportive management.  4.  Disposition: I have no objections to discharge from the next 24 hours if no active bleeding or melena is noted.   15 minutes was spent with the patient face-to-face today.  More than 50% of time was dedicated to discussing his laboratory data, treatment options and arranging for follow-up upon his discharge.    LOS: 3 days   Zola Button 05/22/2018, 7:47 AM

## 2018-05-23 ENCOUNTER — Inpatient Hospital Stay (HOSPITAL_COMMUNITY): Payer: Medicare HMO | Admitting: Anesthesiology

## 2018-05-23 ENCOUNTER — Encounter (HOSPITAL_COMMUNITY): Payer: Self-pay | Admitting: Anesthesiology

## 2018-05-23 LAB — CBC WITH DIFFERENTIAL/PLATELET
Abs Immature Granulocytes: 0.18 10*3/uL — ABNORMAL HIGH (ref 0.00–0.07)
BASOS PCT: 1 %
Basophils Absolute: 0 10*3/uL (ref 0.0–0.1)
Eosinophils Absolute: 0 10*3/uL (ref 0.0–0.5)
Eosinophils Relative: 1 %
HCT: 28 % — ABNORMAL LOW (ref 39.0–52.0)
Hemoglobin: 9.1 g/dL — ABNORMAL LOW (ref 13.0–17.0)
Immature Granulocytes: 8 %
Lymphocytes Relative: 21 %
Lymphs Abs: 0.5 10*3/uL — ABNORMAL LOW (ref 0.7–4.0)
MCH: 28.6 pg (ref 26.0–34.0)
MCHC: 32.5 g/dL (ref 30.0–36.0)
MCV: 88.1 fL (ref 80.0–100.0)
Monocytes Absolute: 0.2 10*3/uL (ref 0.1–1.0)
Monocytes Relative: 11 %
NRBC: 2.7 % — AB (ref 0.0–0.2)
Neutro Abs: 1.3 10*3/uL — ABNORMAL LOW (ref 1.7–7.7)
Neutrophils Relative %: 58 %
Platelets: 60 10*3/uL — ABNORMAL LOW (ref 150–400)
RBC: 3.18 MIL/uL — AB (ref 4.22–5.81)
RDW: 14.7 % (ref 11.5–15.5)
WBC: 2.2 10*3/uL — ABNORMAL LOW (ref 4.0–10.5)

## 2018-05-23 LAB — CBC
HCT: 30.3 % — ABNORMAL LOW (ref 39.0–52.0)
HCT: 31.4 % — ABNORMAL LOW (ref 39.0–52.0)
HEMOGLOBIN: 10 g/dL — AB (ref 13.0–17.0)
Hemoglobin: 9.7 g/dL — ABNORMAL LOW (ref 13.0–17.0)
MCH: 27.8 pg (ref 26.0–34.0)
MCH: 28.1 pg (ref 26.0–34.0)
MCHC: 32 g/dL (ref 30.0–36.0)
MCHC: 31.8 g/dL (ref 30.0–36.0)
MCV: 86.8 fL (ref 80.0–100.0)
MCV: 88.2 fL (ref 80.0–100.0)
Platelets: 58 10*3/uL — ABNORMAL LOW (ref 150–400)
Platelets: 95 10*3/uL — ABNORMAL LOW (ref 150–400)
RBC: 3.49 MIL/uL — AB (ref 4.22–5.81)
RBC: 3.56 MIL/uL — ABNORMAL LOW (ref 4.22–5.81)
RDW: 14.9 % (ref 11.5–15.5)
RDW: 14.7 % (ref 11.5–15.5)
WBC: 2.3 10*3/uL — ABNORMAL LOW (ref 4.0–10.5)
WBC: 3 10*3/uL — ABNORMAL LOW (ref 4.0–10.5)
nRBC: 2.2 % — ABNORMAL HIGH (ref 0.0–0.2)
nRBC: 2 % — ABNORMAL HIGH (ref 0.0–0.2)

## 2018-05-23 LAB — GLUCOSE, CAPILLARY: Glucose-Capillary: 123 mg/dL — ABNORMAL HIGH (ref 70–99)

## 2018-05-23 MED ORDER — PEG-KCL-NACL-NASULF-NA ASC-C 100 G PO SOLR
0.5000 | Freq: Once | ORAL | Status: DC
  Filled 2018-05-23: qty 1

## 2018-05-23 MED ORDER — ACETAMINOPHEN 325 MG PO TABS
650.0000 mg | ORAL_TABLET | Freq: Once | ORAL | Status: AC
  Administered 2018-05-23: 650 mg via ORAL
  Filled 2018-05-23: qty 2

## 2018-05-23 MED ORDER — METOPROLOL TARTRATE 5 MG/5ML IV SOLN
5.0000 mg | Freq: Once | INTRAVENOUS | Status: AC
  Administered 2018-05-23: 5 mg via INTRAVENOUS
  Filled 2018-05-23: qty 5

## 2018-05-23 MED ORDER — SODIUM CHLORIDE 0.9% IV SOLUTION
Freq: Once | INTRAVENOUS | Status: AC
  Administered 2018-05-23: 12:00:00 via INTRAVENOUS

## 2018-05-23 MED ORDER — PEG-KCL-NACL-NASULF-NA ASC-C 100 G PO SOLR
0.5000 | Freq: Once | ORAL | Status: AC
  Administered 2018-05-23: 100 g via ORAL
  Filled 2018-05-23: qty 1

## 2018-05-23 MED ORDER — PEG-KCL-NACL-NASULF-NA ASC-C 100 G PO SOLR: 1.0000 | Freq: Once | ORAL | Status: DC

## 2018-05-23 NOTE — Progress Notes (Signed)
MEWS Guidelines - (patients age 76 and over)  Red - At High Risk for Deterioration Yellow - At risk for Deterioration  1. Go to room and assess patient 2. Validate data. Is this patient's baseline? If data confirmed: 3. Is this an acute change? 4. Administer prn meds/treatments as ordered. 5. Note Sepsis score 6. Review goals of care 7. Sports coach, RRT nurse and Provider. 8. Ask Provider to come to bedside.  9. Document patient condition/interventions/response. 10. Increase frequency of vital signs and focused assessments to at least q15 minutes x 4, then q30 minutes x2. - If stable, then q1h x3, then q4h x3 and then q8h or dept. routine. - If unstable, contact Provider & RRT nurse. Prepare for possible transfer. 11. Add entry in progress notes using the smart phrase ".MEWS". 1. Go to room and assess patient 2. Validate data. Is this patient's baseline? If data confirmed: 3. Is this an acute change? 4. Administer prn meds/treatments as ordered? 5. Note Sepsis score 6. Review goals of care 7. Sports coach and Provider 8. Call RRT nurse as needed. 9. Document patient condition/interventions/response. 10. Increase frequency of vital signs and focused assessments to at least q2h x2. - If stable, then q4h x2 and then q8h or dept. routine. - If unstable, contact Provider & RRT nurse. Prepare for possible transfer. 11. Add entry in progress notes using the smart phrase ".MEWS".  Green - Likely stable Lavender - Comfort Care Only  1. Continue routine/ordered monitoring.  2. Review goals of care. 1. Continue routine/ordered monitoring. 2. Review goals of care.   Patient pulse is elevated at 117 this is not baseline for patient. C/O shortness of breath earlier in the day. SpO2 was 100, no distress, added 1L of O2 for comfort. Notified Dr. Silas [place], vital sign frequency updated. Patient is not in distress and is stable.

## 2018-05-23 NOTE — Care Management (Signed)
This CM has approached pt and family 3 times for choice for home health providers. Still with no decision. This CM gave family my phone number for when home health provider has been decided. Marney Doctor RN,BSN [phone]

## 2018-05-23 NOTE — Progress Notes (Addendum)
[facility] Progress Note   Chief Complaint:   Anemia, intermittent black stool    SUBJECTIVE:    Black stool last night and again this am. The one this am is still in toilet. Stool is black, toilet water red.   Feels okay. No abdominal pain, nausea, SOB, etc...    ASSESSMENT AND PLAN:   1. 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 He has been getting palliative radiation. Off chemo due to pancytopenia.Received two units of platelet prior to EGD. Count slowly declining again going from 94 >> 75>> 60.  -in light of ongoing bleeding and need for procedure will give another unit of platelets today.   2.Persistent GI bleeding / profound anemia. Sporadic episodes of melena starting a few days prior to admission.  Another episode of melena last night and this am. I saw the BM and it consisted of black stool in red toilet water.  He has required several units of blood this admission, hgb stable overnight, actually up some to 9.1. EGD two days ago >>> gastritis, duodenitis and two small angiectasias ablated with APC. 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 No H. Pylori. -will plan for colonoscopy to evaluate for colonic lesion (especially one in proximal colon since stool black). The risks and benefits of colonoscopy with possible polypectomy were discussed and the patient agrees to proceed.  -If colonoscopy negative then consider small bowel video capsule study. -will make sure blood type / screen hasn't expired. -trend H+H Q 8 hours, transfuse prn    OBJECTIVE:     Vital signs in last 24 hours: Temp:  [98.2 F (36.8 C)-99.5 F (37.5 C)] 98.2 F (36.8 C) (01/31 0647) Pulse Rate:  [75-101] 75 (01/31 0647) Resp:  [16-20] 16 (01/31 0647) BP: (124-158)/(81-92) 158/90 (01/31 0647) SpO2:  [96 %-100 %] 100 % (01/31 0647) Last BM Date: 05/22/18 General:   Alert, male in NAD  Heart:  Regular rate and rhythm.  No  lower extremity edema   Pulm: Normal respiratory effort, lungs CTA bilaterally without wheezes or crackles. Abdomen:  Soft, mildly distended with tympany, nontender.  Normal bowel sounds, no masses felt.       Neurologic:  Alert and  oriented x4;  grossly normal neurologically. Psych:  Pleasant, cooperative.  Normal mood and affect.   Intake/Output from previous day: 01/30 0701 - 01/31 0700 In: 350 [P.O.:350] Out: -  Intake/Output this shift: No intake/output data recorded.  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

## 2018-05-23 NOTE — Progress Notes (Signed)
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 Patientwasnoted to have a hemoglobin of 3.1 in the ED. Patient did endorse black stool 3 days prior to admission however none since then. Patient also noted to be severely pancytopenic. Patient transfused 4 units of packed red blood cells initially then another 3 units.GI consulted.   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  Acute blood loss anemia due to GI bleeding: Presented with symptomatic anemia.  Received total of 7 units of PRBC.  Underwent EGD on 05/21/2018.  Found to have mild nonspecific gastritis and bulbar duodenitis.  Patient also had nonbleeding gastric AVMs that were treated with APC.  Continue on oral PPI twice daily.    For last 48 hours, patient has stable hemoglobin but continues to pass blood clots as well as fresh blood in the stool.  Noted to plan to undergo colonoscopy by GI tomorrow.    Severe pancytopenia: Related to secondary to fibrosis and involvement of bone marrow with mantle cell lymphoma.  WBC count is low  but stable at 2.2, platelet count is further down to 60 today.  1 unit of platelet transfusion planned.  Stage III mantle cell lymphoma: Patient is currently off therapy. Oncology following.  Severe protein calorie malnutrition: Continue supplement nutrition with Ensure. Followed by dietitian.  Klebsiella pneumoniae UTI: Patient was treated with IV Rocephin. Currently on oral Keflex. Total 7 days of therapy is recommended. Day 5/7.  DVT prophylaxis: SCDs. Code Status: Full code. Family Communication: Daughter at the bedside. Disposition Plan: Home with home health care.  Anticipate tomorrow if colonoscopy is normal.  Antibiotics: Antibiotics Given (last 72 hours)    Date/Time Action Medication Dose   05/21/18 2115 Given   cephALEXin (KEFLEX) capsule 500 mg 500 mg   05/22/18 0946 Given   cephALEXin (KEFLEX) capsule 500 mg 500 mg   05/22/18 2120 Given   cephALEXin (KEFLEX) capsule 500 mg 500 mg   05/23/18 1000 Given   cephALEXin (KEFLEX) capsule 500 mg 500 mg      Continuous Infusions: none   Scheduled Meds: . cephALEXin  500 mg Oral Q12H  . feeding supplement (ENSURE ENLIVE)  237 mL Oral BID BM  . mouth rinse  15 mL Mouth Rinse BID  . pantoprazole  40 mg Oral BID  . peg 3350 powder  0.5 kit Oral Once   And  . peg 3350 powder  0.5 kit Oral Once  . senna-docusate  1 tablet Oral BID    PRN meds: acetaminophen **OR** acetaminophen, ondansetron **OR** ondansetron (ZOFRAN) IV   Consultants:  GI  Procedures: EGD on 1/29  Subjective: Patient seen and examined this morning.  Pleasant, elderly African-American male.  Not in distress.  Daughter was at bedside.  Objective: Vitals:   05/23/18 1038 05/23/18 1235  BP: 134/82 140/85  Pulse: 81 82  Resp: 16 16  Temp: 99.3 F (37.4 C) 100.2 F (37.9 C)  SpO2: 99% 99%    Intake/Output Summary (Last 24 hours) at 05/23/2018 1344 Last data filed at 05/23/2018 0856 Gross per 24 hour  Intake 100 ml  Output -  Net 100  ml   Filed Weights   05/16/18 1722 05/21/18 0926  Weight: 52.6 kg 50 kg   Body mass index is 17.26 kg/m.   Physical Exam: GENERAL: Pleasant elderly African-American male HENT: No scleral pallor or icterus. Pupils equally reactive to light. Oral mucosa is moist NECK: is supple, no palpable thyroid enlargement. CHEST: Clear to auscultation. No crackles or wheezes. Non tender on palpation. Diminished breath sounds bilaterally. CVS: S1 and S2 heard, no murmur. Regular rate and rhythm. No pericardial rub. ABDOMEN: Soft, non-tender, bowel sounds are present. No palpable hepato-splenomegaly. EXTREMITIES: No edema. CNS: Alert, awake oriented x3 SKIN: warm and dry without rashes.  Data Review: I have personally reviewed the laboratory data and studies available.  Terrilee Croak, MD  [facility] 05/23/2018

## 2018-05-23 NOTE — Progress Notes (Signed)
Nutrition Follow-up  DOCUMENTATION CODES:   Severe malnutrition in context of chronic illness, Underweight  INTERVENTION:  - Continue Ensure Enlive. - Will order Magic Cup BID with meals, each supplement provides 290 kcal and 9 grams of protein. - Continue to encourage PO intakes.  - Weigh patient today.   NUTRITION DIAGNOSIS:   Severe Malnutrition related to chronic illness, cancer and cancer related treatments as evidenced by percent weight loss, energy intake < or equal to 75% for > or equal to 1 month, moderate fat depletion, severe muscle depletion. 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   Weight -2.6 kg/6 lb from 1/20-1/24; no new weight x1 week. Per chart review, patient has been eating 0-25% mainly since 1/28 with 100% completion of lunch on 1/29. Ensure Enlive ordered BID and patient has accepted 7 of 10 bottles of this supplement since RD visit on 1/26 afternoon.   Per Dr. Nena Alexander note 1/30: symptomatic anemia and acute blood loss 2/2 GIB s/p 7 units PRBC, s/p EGD 1/29 which showed mild gastritis and duodenitis, severe pancytopenia, stage 3 mantle cell lymphoma, severe PCM, UTI.   Per GI's note this AM: patient had an episode of melena last night and also this AM, plan for colonoscopy to evaluate for colonic lesion.    Medications reviewed; 40 mg oral protonix BID, 1 tablet senokot BID.  Labs reviewed; Cl: 112 mmol/l, BUN: 38 mg/dl,  creatinine: 1.31 mg/dl, Ca: 8.6 mg/dl.     Diet Order:   Diet Order            Diet NPO time specified  Diet effective midnight        Diet clear liquid Room service appropriate? Yes; Fluid consistency: Thin  Diet effective now              EDUCATION NEEDS:   No education needs have been identified at this time  Skin:  Skin Assessment: Reviewed RN Assessment  Last BM:  1/31  Height:   Ht Readings from Last 1 Encounters:  05/21/18 5\' 7"  (1.702 m)    Weight:   Wt Readings from Last 1 Encounters:  05/21/18 50 kg    Ideal Body Weight:  67.2 kg  BMI:  Body mass index is 17.26 kg/m.  Estimated Nutritional Needs:   Kcal:  1900-2100  Protein:  95-105g  Fluid:  2L/day     Jarome Matin, MS, RD, LDN, CNSC Inpatient Clinical Dietitian Pager # [phone] After hours/weekend pager # [phone]

## 2018-05-24 ENCOUNTER — Encounter (HOSPITAL_COMMUNITY)
Admission: EM | Disposition: A | Discharge status: Home Health | Payer: Self-pay | Source: Home / Self Care | Attending: Internal Medicine

## 2018-05-24 LAB — CBC
HCT: 31 % — ABNORMAL LOW (ref 39.0–52.0)
HEMATOCRIT: 25.9 % — AB (ref 39.0–52.0)
Hemoglobin: 9.7 g/dL — ABNORMAL LOW (ref 13.0–17.0)
Hemoglobin: 8.2 g/dL — ABNORMAL LOW (ref 13.0–17.0)
MCH: 27.5 pg (ref 26.0–34.0)
MCH: 28 pg (ref 26.0–34.0)
MCHC: 31.3 g/dL (ref 30.0–36.0)
MCHC: 31.7 g/dL (ref 30.0–36.0)
MCV: 87.8 fL (ref 80.0–100.0)
MCV: 88.4 fL (ref 80.0–100.0)
NRBC: 1.4 % — AB (ref 0.0–0.2)
Platelets: 74 10*3/uL — ABNORMAL LOW (ref 150–400)
Platelets: 67 10*3/uL — ABNORMAL LOW (ref 150–400)
RBC: 3.53 MIL/uL — ABNORMAL LOW (ref 4.22–5.81)
RBC: 2.93 MIL/uL — ABNORMAL LOW (ref 4.22–5.81)
RDW: 14.9 % (ref 11.5–15.5)
RDW: 14.8 % (ref 11.5–15.5)
WBC: 2.4 10*3/uL — ABNORMAL LOW (ref 4.0–10.5)
WBC: 2.2 10*3/uL — ABNORMAL LOW (ref 4.0–10.5)
nRBC: 2.1 % — ABNORMAL HIGH (ref 0.0–0.2)

## 2018-05-24 LAB — BPAM PLATELET PHERESIS
Blood Product Expiration Date: 202001312359
ISSUE DATE / TIME: 202001311223
Unit Type and Rh: 6200

## 2018-05-24 LAB — PREPARE PLATELET PHERESIS: Unit division: 0

## 2018-05-24 SURGERY — COLONOSCOPY
Anesthesia: Monitor Anesthesia Care

## 2018-05-24 NOTE — Progress Notes (Signed)
    Progress Note   Subjective  Patient feeling well this morning Had a brown stool without black/tarry appearance or visible blood Had low-grade fever from 1400-1900 yesterday, treated with Tylenol Got a unit of platelets around 11 AM yesterday Daughter at bedside   Objective  Vital signs in last 24 hours: Temp:  [98 F (36.7 C)-100.7 F (38.2 C)] 98.7 F (37.1 C) (02/01 0618) Pulse Rate:  [82-116] 86 (02/01 0618) Resp:  [16-20] 20 (02/01 0618) BP: (132-165)/(85-104) 138/94 (02/01 0618) SpO2:  [96 %-99 %] 98 % (02/01 0618) Last BM Date: 05/23/18  Gen: awake, alert, chronically ill-appearing in no distress HEENT: anicteric, op clear CV: RRR, no mrg Pulm: CTA b/l Abd: soft, NT/ND, +BS throughout Ext: no c/c/e Neuro: nonfocal   Intake/Output from previous day: 01/31 0701 - 02/01 0700 In: 323.3 [P.O.:100; Blood:223.3] Out: -  Intake/Output this shift: No intake/output data recorded.  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  Concern is been intermittent GI blood loss though in the setting of severe thrombocytopenia requiring platelet transfusion.  Upper endoscopy showed gastroduodenitis and 2 small gastric angiodysplastic lesions treated with APC  1.  Intermittent GI bleeding with melena --Plan had been for colonoscopy today but with fever prep was canceled last night --Today brown stool and very stable blood counts now x3+ days --Platelets have improved with transfusion and are 75,000 --We discussed colonoscopy but patient and daughter prefer to hold off for now particularly given brown stool/lack of further melena and  stable blood counts. --We will change diet back to regular and monitor for recurrent melena.  In this event would reconsider colonoscopy.   --Would try to keep platelets above 50,000 if possible  2.  Klebsiella pneumonia UTI --on antibiotic therapy  3.  Fever --low-grade unclear cause.  Could have been secondary to platelet transfusion.  Monitor closely     LOS: 5 days   Jerene Bears  05/24/2018, 11:52 AM

## 2018-05-24 NOTE — Progress Notes (Signed)
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 Patientwasnoted to have a hemoglobin of 3.1 in the ED. Patient did endorse black stool 3 days prior to admission however none since then. Patient was also noted to be severely pancytopenic. Patient transfused 4 units of packed red blood cellsinitially then another 3 units.GI consulted.  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 Acute blood loss anemia due to GI bleeding: Presented with symptomatic anemia. Received total of 7 units of PRBC. Underwent EGD on 05/21/2018. Found to have mild nonspecific gastritis and bulbar duodenitis. Patient also had nonbleeding gastric AVMs that were treated with APC. Continue on oral PPI twice daily. For last 72 hours, patient has stable hemoglobin but continues to pass blood clots as well as fresh blood in the stool.  Noted a plan to undergo colonoscopy by GI tomorrow.    Severe pancytopenia: Related to secondary to fibrosis and involvement of bone marrow with mantle cell lymphoma.   Labs reviewed  today.  WBC count improved to 3, platelet count improved to 95.  1 unit of platelet was given yesterday.  Fever spikes -patient had 2 episode of fever spikes up to 100.6 yesterday.  Patient currently remains on oral Keflex for UTI.  Continue to trend temperature and WBC.  Patient denies any urinary symptoms, cough or shortness of breath.  Lung sounds clear on examination.  In order to complete the study, will obtain chest x-ray.  If fever recurs, will obtain blood culture as well.  Stage III mantle cell lymphoma: Patient is currently off therapy.Oncology following.  Severe protein calorie malnutrition:Continue supplement nutrition with Ensure.Followed by dietitian.  Klebsiella pneumoniae WIO:XBDZHGD was treated with IV Rocephin. Currently on oral Keflex.Total 7 days of therapy is recommended. Day 67.  DVT prophylaxis:SCDs. Code Status:Full code. Family Communication:Daughter at the bedside. Disposition Plan:Home with home health care. Anticipate tomorrow if colonoscopy is normal.  Antibiotics: Antibiotics Given (last 72 hours)    Date/Time Action Medication Dose   05/21/18 2115 Given   cephALEXin (KEFLEX) capsule 500 mg 500 mg   05/22/18 0946 Given   cephALEXin (KEFLEX) capsule 500 mg 500 mg   05/22/18 2120 Given   cephALEXin (KEFLEX) capsule 500 mg 500 mg   05/23/18 1000 Given   cephALEXin (KEFLEX) capsule 500 mg 500 mg   05/23/18 2109 Given   cephALEXin (KEFLEX) capsule 500 mg 500 mg   05/24/18 0929 Given   cephALEXin (KEFLEX) capsule 500 mg 500 mg     Continuous Infusions: None  Scheduled Meds: . cephALEXin  500 mg Oral Q12H  . feeding supplement (ENSURE ENLIVE)  237 mL Oral BID BM  . mouth rinse  15 mL Mouth Rinse BID  .  pantoprazole  40 mg Oral BID  . peg 3350 powder  0.5 kit Oral Once  . senna-docusate  1 tablet Oral BID    PRN meds: acetaminophen **OR** acetaminophen, ondansetron **OR** ondansetron (ZOFRAN) IV   Subjective: Patient was seen and  examined this morning.  Lying down in bed.  Alert, awake, able to answer simple questions.  Not in distress.  Daughter is at bedside.  Daughter states that she did not notice any blood in last bowel movement last night.  Objective: Vitals:   05/24/18 0206 05/24/18 0618  BP:  (!) 138/94  Pulse: (!) 107 86  Resp:  20  Temp:  98.7 F (37.1 C)  SpO2: 99% 98%    Intake/Output Summary (Last 24 hours) at 05/24/2018 0952 Last data filed at 05/23/2018 1340 Gross per 24 hour  Intake 223.33 ml  Output -  Net 223.33 ml   Filed Weights   05/16/18 1722 05/21/18 0926  Weight: 52.6 kg 50 kg   Body mass index is 17.26 kg/m.   Physical Exam: GENERAL: Pleasant elderly African-American male.  Cachectic.  Not in distress HENT: No scleral pallor or icterus. Pupils equally reactive to light. Oral mucosa is moist NECK: is supple, no palpable thyroid enlargement. CHEST: Clear to auscultation. No crackles or wheezes. Non tender on palpation. Diminished breath sounds bilaterally. CVS: S1 and S2 heard, no murmur. Regular rate and rhythm. No pericardial rub. ABDOMEN: Soft, non-tender, bowel sounds are present. No palpable hepato-splenomegaly. EXTREMITIES: No edema. CNS: Alert, awake, oriented to place and person SKIN: warm and dry without rashes.  Data Review: I have personally reviewed the laboratory data and studies available.  Terrilee Croak, MD  [facility] 05/24/2018

## 2018-05-25 ENCOUNTER — Inpatient Hospital Stay (HOSPITAL_COMMUNITY): Payer: Medicare HMO

## 2018-05-25 LAB — CBC WITH DIFFERENTIAL/PLATELET
Abs Immature Granulocytes: 0.11 10*3/uL — ABNORMAL HIGH (ref 0.00–0.07)
Basophils Absolute: 0 10*3/uL (ref 0.0–0.1)
Basophils Relative: 1 %
Eosinophils Absolute: 0 10*3/uL (ref 0.0–0.5)
Eosinophils Relative: 1 %
HCT: 28.4 % — ABNORMAL LOW (ref 39.0–52.0)
Hemoglobin: 8.9 g/dL — ABNORMAL LOW (ref 13.0–17.0)
Immature Granulocytes: 7 %
Lymphocytes Relative: 18 %
Lymphs Abs: 0.3 10*3/uL — ABNORMAL LOW (ref 0.7–4.0)
MCH: 27.5 pg (ref 26.0–34.0)
MCHC: 31.3 g/dL (ref 30.0–36.0)
MCV: 87.7 fL (ref 80.0–100.0)
Monocytes Absolute: 0.1 10*3/uL (ref 0.1–1.0)
Monocytes Relative: 5 %
Neutro Abs: 1.1 10*3/uL — ABNORMAL LOW (ref 1.7–7.7)
Neutrophils Relative %: 68 %
PLATELETS: 54 10*3/uL — AB (ref 150–400)
RBC: 3.24 MIL/uL — ABNORMAL LOW (ref 4.22–5.81)
RDW: 14.7 % (ref 11.5–15.5)
WBC: 1.6 10*3/uL — ABNORMAL LOW (ref 4.0–10.5)
nRBC: 1.9 % — ABNORMAL HIGH (ref 0.0–0.2)

## 2018-05-25 LAB — BASIC METABOLIC PANEL
Anion gap: 9 (ref 5–15)
BUN: 23 mg/dL (ref 8–23)
CO2: 25 mmol/L (ref 22–32)
Calcium: 8.9 mg/dL (ref 8.9–10.3)
Chloride: 108 mmol/L (ref 98–111)
Creatinine, Ser: 1.17 mg/dL (ref 0.61–1.24)
GFR calc Af Amer: >60 mL/min (ref >60)
GFR calc non Af Amer: >60 mL/min (ref >60)
Glucose, Bld: 169 mg/dL — ABNORMAL HIGH (ref 70–99)
Potassium: 3.2 mmol/L — ABNORMAL LOW (ref 3.5–5.1)
Sodium: 142 mmol/L (ref 135–145)

## 2018-05-25 MED ORDER — PIPERACILLIN-TAZOBACTAM 3.375 G IVPB: 3.3750 g | Freq: Four times a day (QID) | INTRAVENOUS | Status: DC

## 2018-05-25 MED ORDER — PIPERACILLIN-TAZOBACTAM 3.375 G IVPB
3.3750 g | Freq: Three times a day (TID) | INTRAVENOUS | Status: DC
  Administered 2018-05-25 – 2018-05-27 (×6): 3.375 g via INTRAVENOUS
  Filled 2018-05-25 (×8): qty 50

## 2018-05-25 MED ORDER — SODIUM CHLORIDE 0.9 % IV SOLN
INTRAVENOUS | Status: DC | PRN
  Administered 2018-05-25 – 2018-05-27 (×2): 250 mL via INTRAVENOUS

## 2018-05-25 NOTE — Progress Notes (Signed)
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  Patientwasnoted to have a hemoglobin of 3.1 in the ED. Patient did endorse black stool 3 days prior to admission however none since then. Patient was also noted to be severely pancytopenic with a hemoglobin at 5.7. Patient was transfused 4 units of packed red blood cellsinitially then another 3 units.GI consulted.  Subjective: Patient was seen and examined this morning.  Elderly African-American male.  Lying in bed.  Feels better.  Denies any shortness of breath, chest pain cough, burning urination, confusion.  Spiked fever up to 100.7 twice last night as well.  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  Blood work today showed WBC count down to 1.6.  Chest x-ray obtained yesterday was negative.  Patient is currently on oral Keflex for UTI.  I will switch him to IV Zosyn for better coverage.  Blood culture obtained today.  Acute blood loss anemia due to GI bleeding:  Presented with symptomatic anemia with hemoglobin of 5.7. Received total of 7 units of PRBC. Underwent EGD on 05/21/2018. Found to have mild nonspecific gastritis and bulbar duodenitis. Patient also had nonbleeding gastric AVMs that were treated with APC. Continue on oral PPI twice daily.For last 4 days, patient has stable hemoglobin.  Severe pancytopenia: Related to secondary to fibrosis and involvement of bone marrow with mantle cell lymphoma.  Labs reviewed today.  Blood work today shows worsening WBC count as well as worsening platelets.  Monitor.   Stage III mantle cell lymphoma: Patient is currently off therapy.Oncology following.  Severe protein calorie malnutrition:Continue supplement nutrition with Ensure.Followed by dietitian.  Klebsiella pneumoniae CZY:SAYTKZS on admissi regular diet diet on.  Patient was treated with IV Rocephin and switched to oral Keflex.  Antibiotic switched to IV Zosyn because of recurrence of fever.    Mobility: Ambulates independently to the bathroom.  Encouraged to ambulate on the hallway Diet: Regular diet DVT prophylaxis:  SCDs Code Status:   Code Status: Full Code  Family Communication:  Daughter was at bedside Disposition Plan:  Ultimate plan for home with home health care.  Antibiotics: . IV Rocephin 1/27 -1/31 . Oral Keflex -1/31 -2/2 . IV Zosyn - 2/2 -continue  Infusions: None   Scheduled Meds: . feeding supplement (ENSURE ENLIVE)  237 mL Oral BID BM  . mouth rinse  15 mL Mouth Rinse BID  . pantoprazole  40 mg Oral BID  . peg 3350 powder  0.5 kit Oral Once  . senna-docusate  1 tablet Oral BID    PRN meds: acetaminophen **OR** acetaminophen, ondansetron **OR** ondansetron (ZOFRAN) IV  Consultants:  GI  Procedures:  EGD on 1/29  Objective: Vitals:   05/25/18 0500 05/25/18 1235  BP: 130/88 125/71  Pulse: 92 88  Resp: 16 16  Temp: 98.8 F (37.1 C) 98.2 F (36.8 C)  SpO2: 98% 98%    Intake/Output Summary  (Last 24 hours) at 05/25/2018 1339 Last data filed at 05/24/2018 1749 Gross per 24 hour  Intake 240 ml  Output -  Net 240 ml   Filed Weights   05/16/18 1722 05/21/18 0926  Weight: 52.6 kg 50 kg   Body mass index is 17.26 kg/m.   Physical Exam: GENERAL: Pleasant elderly African-American male.  Lying down in bed.  Not in distress HENT: No scleral pallor or icterus. Pupils equally reactive to light. Oral mucosa is moist NECK: is supple, no palpable thyroid enlargement. CHEST: Clear to auscultation. No crackles or wheezes. Non tender on palpation. Diminished breath sounds bilaterally. CVS: S1 and S2 heard, no murmur. Regular rate and rhythm. No pericardial rub. ABDOMEN: Soft, non-tender, bowel sounds are present. No palpable hepato-splenomegaly. EXTREMITIES: No edema.  No calf tenderness CNS: Unremarkable x3 SKIN: warm and dry without rashes.  Data Review: I have personally reviewed the laboratory data and studies available.  CBC Latest Ref Rng & Units 05/25/2018 05/24/2018 05/24/2018  WBC 4.0 - 10.5 K/uL 1.6(L) 2.2(L) 2.4(L)  Hemoglobin 13.0 - 17.0 g/dL 8.9(L) 8.2(L) 9.7(L)  Hematocrit 39.0 - 52.0 % 28.4(L) 25.9(L) 31.0(L)  Platelets 150 - 400 K/uL 54(L) 67(L) 74(L)   BMP Latest Ref Rng & Units 05/25/2018 05/21/2018 05/20/2018  Glucose 70 - 99 mg/dL 169(H) 97 134(H)  BUN 8 - 23 mg/dL 23 38(H) 47(H)  Creatinine 0.61 - 1.24 mg/dL 1.17 1.31(H) 1.19  BUN/Creat Ratio 10 - 24 - - -  Sodium 135 - 145 mmol/L 142 144 142  Potassium 3.5 - 5.1 mmol/L 3.2(L) 3.5 4.4  Chloride 98 - 111 mmol/L 108 112(H) 112(H)  CO2 22 - 32 mmol/L 25 24 21(L)  Calcium 8.9 - 10.3 mg/dL 8.9 8.6(L) 8.1(L)    Terrilee Croak, MD  [facility] 05/25/2018

## 2018-05-25 NOTE — Progress Notes (Signed)
     [facility] Progress Note   Chief Complaint:   Anemia, intermittent melena   SUBJECTIVE:    no complaints except for some loose stools. No further GI bleeding   ASSESSMENT AND PLAN:   1. Intermittent melena. Upper endoscopy showed gastroduodenitis and 2 small gastric angiodysplastic lesions treated with APC.  Colonoscopy cancelled due to fever. Patient hasn't had any melenic stools since 1/31.  -Holding off on colonoscopy for now, patient and daughter agree. If recurrent bleeding then happy to reevaluate patient -Would try to keep platelets above 50K -call if any questions  2. Fever, none since last night. Klebsiella p. UTI. On antibiotics.   3. Pancytopenia / bone marrow fibrosis / Stage III Mantle Cell lymphoma.   4. Loose stool, new. Maybe from bowel prep that he started a few days ago. Would have low threshold for c-diff check.    4.   OBJECTIVE:     Vital signs in last 24 hours: Temp:  [98.2 F (36.8 C)-100.7 F (38.2 C)] 98.2 F (36.8 C) (02/02 1235) Pulse Rate:  [88-96] 88 (02/02 1235) Resp:  [16] 16 (02/02 1235) BP: (125-130)/(71-88) 125/71 (02/02 1235) SpO2:  [96 %-98 %] 98 % (02/02 1235) Last BM Date: 05/24/18 General:   Alert, thin male in NAD Heart:  Regular rate and rhythm; no murmur.  No lower extremity edema   Pulm: Normal respiratory effort Abdomen:  Soft, nondistended, nontender.  Hyperactive bowel sounds.. Neurologic:  Alert and  oriented x4;  grossly normal neurologically. Psych:  Pleasant, cooperative.  Normal mood and affect.   Intake/Output from previous day: 02/01 0701 - 02/02 0700 In: 600 [P.O.:600] Out: -  Intake/Output this shift: Total I/O In: 7.2 [I.V.:0.2; IV Piggyback:7.1] Out: -   Lab Results: Recent Labs    05/24/18 0230 05/24/18 1017 05/25/18 0843  WBC 2.4* 2.2* 1.6*  HGB 9.7* 8.2* 8.9*  HCT 31.0* 25.9* 28.4*  PLT 74* 67* 54*   BMET Recent Labs    05/25/18 0843  NA 142  K 3.2*  CL 108  CO2  25  GLUCOSE 169*  BUN 23  CREATININE 1.17  CALCIUM 8.9    Dg Chest Port 1 View  Result Date: 05/25/2018 CLINICAL DATA:  CHF EXAM: PORTABLE CHEST 1 VIEW COMPARISON:  05/17/2018 FINDINGS: No focal consolidation or frank interstitial edema. Mild scarring in the medial right upper lobe, left perihilar region, and right infrahilar region. No pleural effusion or pneumothorax. The heart is top-normal in size. IMPRESSION: No evidence of acute cardiopulmonary disease. Electronically Signed   By: Julian Hy M.D.   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

## 2018-05-26 DIAGNOSIS — C8310 Mantle cell lymphoma, unspecified site: Secondary | ICD-10-CM

## 2018-05-26 LAB — BASIC METABOLIC PANEL
Anion gap: 9 (ref 5–15)
BUN: 23 mg/dL (ref 8–23)
CALCIUM: 8.5 mg/dL — AB (ref 8.9–10.3)
CO2: 25 mmol/L (ref 22–32)
Chloride: 108 mmol/L (ref 98–111)
Creatinine, Ser: 1.2 mg/dL (ref 0.61–1.24)
GFR calc Af Amer: >60 mL/min (ref >60)
GFR, EST NON AFRICAN AMERICAN: 59 mL/min — AB (ref >60)
Glucose, Bld: 117 mg/dL — ABNORMAL HIGH (ref 70–99)
Potassium: 3.5 mmol/L (ref 3.5–5.1)
Sodium: 142 mmol/L (ref 135–145)

## 2018-05-26 LAB — CBC WITH DIFFERENTIAL/PLATELET
Abs Immature Granulocytes: 0.22 10*3/uL — ABNORMAL HIGH (ref 0.00–0.07)
Basophils Absolute: 0 10*3/uL (ref 0.0–0.1)
Basophils Relative: 1 %
EOS ABS: 0 10*3/uL (ref 0.0–0.5)
Eosinophils Relative: 1 %
HCT: 25 % — ABNORMAL LOW (ref 39.0–52.0)
Hemoglobin: 7.8 g/dL — ABNORMAL LOW (ref 13.0–17.0)
Immature Granulocytes: 12 %
Lymphocytes Relative: 25 %
Lymphs Abs: 0.5 10*3/uL — ABNORMAL LOW (ref 0.7–4.0)
MCH: 28.1 pg (ref 26.0–34.0)
MCHC: 31.2 g/dL (ref 30.0–36.0)
MCV: 89.9 fL (ref 80.0–100.0)
Monocytes Absolute: 0.2 10*3/uL (ref 0.1–1.0)
Monocytes Relative: 8 %
Neutro Abs: 1 10*3/uL — ABNORMAL LOW (ref 1.7–7.7)
Neutrophils Relative %: 53 %
Platelets: 35 10*3/uL — ABNORMAL LOW (ref 150–400)
RBC: 2.78 MIL/uL — ABNORMAL LOW (ref 4.22–5.81)
RDW: 14.7 % (ref 11.5–15.5)
WBC: 1.9 10*3/uL — ABNORMAL LOW (ref 4.0–10.5)
nRBC: 3.2 % — ABNORMAL HIGH (ref 0.0–0.2)

## 2018-05-26 LAB — PREPARE RBC (CROSSMATCH)

## 2018-05-26 MED ORDER — SODIUM CHLORIDE 0.9% IV SOLUTION
Freq: Once | INTRAVENOUS | Status: AC
  Administered 2018-05-26: 14:00:00 via INTRAVENOUS

## 2018-05-26 NOTE — Progress Notes (Signed)
IP PROGRESS NOTE  Subjective:   Events noted last few days.  Joel [name] reports no major complaints at this time.  He denies any active bleeding.  He denies any hematochezia, melena or hemoptysis.  He denies any abdominal pain or mucus production.  He remains relatively weak ambulating inside the room.  Objective:  Vital signs in last 24 hours: Temp:  [98.2 F (36.8 C)-100.8 F (38.2 C)] 98.5 F (36.9 C) (02/03 0518) Pulse Rate:  [67-94] 67 (02/03 0518) Resp:  [16-19] 17 (02/03 0518) BP: (123-132)/(71-79) 123/75 (02/03 0518) SpO2:  [96 %-98 %] 98 % (02/03 0518) Weight change:  Last BM Date: 05/25/18  Intake/Output from previous day: 02/02 0701 - 02/03 0700 In: 85 [I.V.:34.5; IV Piggyback:50.5] Out: 200 [Urine:200]   General appearance: Comfortable appearing without any discomfort Head: Normocephalic without any trauma Oropharynx: Mucous membranes are moist and pink without any thrush or ulcers. Eyes: Pupils are equal and round reactive to light. Lymph nodes: No cervical, supraclavicular, inguinal or axillary lymphadenopathy.   Heart:regular rate and rhythm.  S1 and S2 without leg edema. Lung: Clear without any rhonchi or wheezes.  No dullness to percussion. Abdomin: Soft, nontender, nondistended with good bowel sounds.  No hepatosplenomegaly. Musculoskeletal: No joint deformity or effusion.  Full range of motion noted. Neurological: No deficits noted on motor, sensory and deep tendon reflex exam. Skin: No petechial rash or dryness.  Appeared moist.        Lab Results: Recent Labs    05/25/18 0843 05/26/18 0511  WBC 1.6* 1.9*  HGB 8.9* 7.8*  HCT 28.4* 25.0*  PLT 54* 35*    BMET Recent Labs    05/25/18 0843 05/26/18 0511  NA 142 142  K 3.2* 3.5  CL 108 108  CO2 25 25  GLUCOSE 169* 117*  BUN 23 23  CREATININE 1.17 1.20  CALCIUM 8.9 8.5*    Studies/Results: Dg Chest Port 1 View  Result Date: 05/25/2018 CLINICAL DATA:  CHF EXAM: PORTABLE CHEST 1 VIEW  COMPARISON:  05/17/2018 FINDINGS: No focal consolidation or frank interstitial edema. Mild scarring in the medial right upper lobe, left perihilar region, and right infrahilar region. No pleural effusion or pneumothorax. The heart is top-normal in size. IMPRESSION: No evidence of acute cardiopulmonary disease. Electronically Signed   By: Julian Hy M.D.   On: 05/25/2018 04:57    Medications: I have reviewed the patient's current medications.  Assessment/Plan:  78 year old man with:   1.  Pancytopenia: Continues to be transfusion dependent related to mantle cell lymphoma in the bone marrow as well as marrow fibrosis related to treatment.  I agree with supportive transfusion as you are doing.  His hemoglobin drifted slightly in the last 24 hours with platelet count currently at 35.  I would recommend transfusing platelets and packed red cells prior to discharge despite his GI bleeding appears to have improved.  The goal is to keep his platelets above 50 and hemoglobin above 8.   2.  Melena: No active bleeding noted at this time.  GI is following and his melena appears to have improved.  3.  Prognosis: Remains poor at this time with 2 incurable malignancies.  Any treatment is palliative at this time..  4.  Disposition: We will arrange follow-up upon his discharge to follow his counts closely.  I have no objections to discharge once cleared by primary team.   15 minutes was spent with the patient face-to-face today.  More than 50% of time was dedicated to  answering questions regarding etiology of his cytopenias, future treatment options and coordinating plan of care.    LOS: 7 days   Joel [name] 05/26/2018, 10:02 AM

## 2018-05-26 NOTE — Progress Notes (Signed)
RN began to transfuse packed RBC.15 mins post blood transfusion start time, pt's temperature rose from 99. To 100.9. The doctor was notified and RN was instructed to turn the blood down to start rate and to give pt a tylenol. Will administer tylenol and recheck temperature. Will continue to monitor.

## 2018-05-26 NOTE — Progress Notes (Signed)
Occupational Therapy Treatment Patient Details Name: Joel [name] MRN: [id number] DOB: Feb 19, 1941 Today's Date: 05/26/2018    History of present illness Pt is a 78 y.o. M with significant PMH of stroke, mantle cell lymphoma, prostate CA, anemia, CKD-4, who presents with critical Hgb 2.9. and generalized weakness.       Follow Up Recommendations  Home health OT    Equipment Recommendations  3 in 1 bedside commode    Recommendations for Other Services      Precautions / Restrictions Precautions Precautions: Fall Restrictions Weight Bearing Restrictions: No       Mobility Bed Mobility Overal bed mobility: Modified Independent Bed Mobility: Supine to Sit;Sit to Supine     Supine to sit: Modified independent (Device/Increase time);HOB elevated Sit to supine: Modified independent (Device/Increase time)   General bed mobility comments: increased time, HOB up 25*  Transfers Overall transfer level: Needs assistance Equipment used: None Transfers: Sit to/from [facility] Sit to Stand: Min assist Stand pivot transfers: Min assist       General transfer comment: VCs hand placement    Balance Overall balance assessment: Needs assistance Sitting-balance support: No upper extremity supported Sitting balance-Leahy Scale: Good     Standing balance support: During functional activity;No upper extremity supported Standing balance-Leahy Scale: Fair                             ADL either performed or assessed with clinical judgement   ADL Overall ADL's : Needs assistance/impaired                         Toilet Transfer: Minimal assistance;Comfort height toilet;Ambulation;RW   Toileting- Clothing Manipulation and Hygiene: Minimal assistance;Sit to/from stand;Cueing for sequencing;Cueing for safety       Functional mobility during ADLs: Minimal assistance;Cueing for sequencing;Cueing for safety;Rolling walker General ADL Comments:  used  level 1 theraband and re educated in UB exercise; pt agreeable and participative               Cognition Arousal/Alertness: Awake/alert Behavior During Therapy: WFL for tasks assessed/performed Overall Cognitive Status: Within Functional Limits for tasks assessed                                          Exercises General Exercises - Upper Extremity Shoulder Flexion: Both;10 reps;Theraband Theraband Level (Shoulder Flexion): Level 1 (Yellow) Shoulder ABduction: Both;10 reps;Theraband Theraband Level (Shoulder Abduction): Level 1 (Yellow) Shoulder ADduction: Both;10 reps;Theraband Theraband Level (Shoulder Adduction): Level 1 (Yellow) Shoulder Horizontal ABduction: Both;10 reps;Theraband Theraband Level (Shoulder Horizontal Abduction): Level 1 (Yellow) Shoulder Horizontal ADduction: Both;10 reps;AROM;Theraband Theraband Level (Shoulder Horizontal Adduction): Level 1 (Yellow)   Shoulder Instructions            Pertinent Vitals/ Pain       Pain Assessment: No/denies pain  Home Living                                              Frequency  Min 2X/week        Progress Toward Goals  OT Goals(current goals can now be found in the care plan section)  Progress towards OT goals: Progressing toward goals  Plan Discharge plan remains appropriate       AM-PAC OT "6 Clicks" Daily Activity     Outcome Measure   Help from another person eating meals?: None Help from another person taking care of personal grooming?: None Help from another person toileting, which includes using toliet, bedpan, or urinal?: A Little Help from another person bathing (including washing, rinsing, drying)?: A Little Help from another person to put on and taking off regular upper body clothing?: A Little Help from another person to put on and taking off regular lower body clothing?: A Little 6 Click Score: 20    End of Session Equipment Utilized  During Treatment: Other (comment);Rolling walker(theraband)  OT Visit Diagnosis: Muscle weakness (generalized) (M62.81)   Activity Tolerance Patient tolerated treatment well   Patient Left with call bell/phone within reach;with family/visitor present;in bed   Nurse Communication Mobility status        Time: 1545-1610 OT Time Calculation (min): 25 min  Charges: OT General Charges $OT Visit: 1 Visit OT Treatments $Self Care/Home Management : 8-22 mins $Therapeutic Exercise: 8-22 mins  Kari Baars, [place] [phone] Office- [phone], Edwena Felty D 05/26/2018, 4:33 PM

## 2018-05-26 NOTE — Progress Notes (Signed)
Physical Therapy Treatment Patient Details Name: Joel [name] MRN: [id number] DOB: 21-Sep-1940 Today's Date: 05/26/2018    History of Present Illness Pt is a 78 y.o. M with significant PMH of stroke, mantle cell lymphoma, prostate CA, anemia, CKD-4, who presents with critical Hgb 2.9. and generalized weakness.     PT Comments    Pt ambulated 200' with RW and close supervision, frequent verbal cues to correct flexed posture and for positioning in RW. No loss of balance with ambulation. Pt had worse posture when he attempted to walk without RW.   Follow Up Recommendations  Home health PT     Equipment Recommendations  Rolling walker with 5" wheels    Recommendations for Other Services       Precautions / Restrictions Precautions Precautions: Fall Restrictions Weight Bearing Restrictions: No    Mobility  Bed Mobility Overal bed mobility: Modified Independent Bed Mobility: Supine to Sit;Sit to Supine     Supine to sit: Modified independent (Device/Increase time);HOB elevated Sit to supine: Modified independent (Device/Increase time)   General bed mobility comments: increased time, HOB up 25*  Transfers Overall transfer level: Needs assistance Equipment used: None Transfers: Sit to/from Stand Sit to Stand: Supervision         General transfer comment: VCs hand placement  Ambulation/Gait Ambulation/Gait assistance: Min guard;Supervision Gait Distance (Feet): 200 Feet Assistive device: Rolling walker (2 wheeled) Gait Pattern/deviations: Step-through pattern;Trunk flexed Gait velocity: decreased    General Gait Details: gait is steady with RW, Frequent VCs to correct flexed posture and for positioning in RW (pt steps too far behind it)   Chief Strategy Officer    Modified Rankin (Stroke Patients Only)       Balance Overall balance assessment: Needs assistance Sitting-balance support: No upper extremity supported Sitting  balance-Leahy Scale: Good     Standing balance support: During functional activity;No upper extremity supported Standing balance-Leahy Scale: Fair Standing balance comment: reaching for external objects in environment without DME                            Cognition Arousal/Alertness: Awake/alert Behavior During Therapy: WFL for tasks assessed/performed Overall Cognitive Status: Within Functional Limits for tasks assessed                                        Exercises      General Comments        Pertinent Vitals/Pain Pain Assessment: No/denies pain    Home Living                      Prior Function            PT Goals (current goals can now be found in the care plan section) Acute Rehab PT Goals Patient Stated Goal: to go home, likes golf PT Goal Formulation: With patient/family Time For Goal Achievement: 06/02/18 Potential to Achieve Goals: Good Progress towards PT goals: Progressing toward goals    Frequency    Min 3X/week      PT Plan Current plan remains appropriate    Co-evaluation              AM-PAC PT "6 Clicks" Mobility   Outcome Measure  Help needed turning from your back to your side while in  a flat bed without using bedrails?: A Little Help needed moving from lying on your back to sitting on the side of a flat bed without using bedrails?: A Little Help needed moving to and from a bed to a chair (including a wheelchair)?: A Little Help needed standing up from a chair using your arms (e.g., wheelchair or bedside chair)?: A Little Help needed to walk in hospital room?: A Little Help needed climbing 3-5 steps with a railing? : A Little 6 Click Score: 18    End of Session Equipment Utilized During Treatment: Gait belt Activity Tolerance: Patient tolerated treatment well Patient left: in bed;with call bell/phone within reach;with family/visitor present Nurse Communication: Mobility status PT Visit  Diagnosis: Unsteadiness on feet (R26.81);Muscle weakness (generalized) (M62.81)     Time: 8921-1941 PT Time Calculation (min) (ACUTE ONLY): 28 min  Charges:  $Gait Training: 8-22 mins $Therapeutic Activity: 8-22 mins                     Blondell Reveal Kistler PT 05/26/2018  Acute Rehabilitation Services Pager [phone] Office [phone]

## 2018-05-26 NOTE — Progress Notes (Signed)
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  Patientwasnoted to have a hemoglobin of 3.1 in the ED. Patient did endorse black stool 3 days prior to admission however none since then. Patient wasalso noted to be severely pancytopenic with a hemoglobin at 5.7. Patient was transfused 7 units of PRBCs and 1 unit of platelets. His hospital course got prolonged also because of intermittent fever spikes.  Subjective: Patient was seen and examined this morning.  Pleasant elderly African-American male, thin built.  Lying down in bed.  He had an episode of fever of 100.8 last night as well.  Does not have any new symptoms.  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  Wondering if there is his warm room. Chest x-ray negative.  WBC count low secondary to bone marrow suppression.  Blood culture pending.  Continue IV Zosyn for now.  Acute blood loss anemia due to GI bleeding: Presented with symptomatic  anemia with hemoglobin of 5.7. Received total of 7 units of PRBC. Underwent EGD on 05/21/2018. Found to have mild nonspecific gastritis and bulbar duodenitis. Patient also had nonbleeding gastric AVMs that were treated with APC. Continue on oral PPI twice daily.For last4 days, patient has been slowly drifting down, hemoglobin today is low at 7.8.  Per oncology recommendation, will transfuse him 1 unit of PRBC and 1 unit of platelets today.  Thrombocytopenia -platelet level trending down, 35,000 today.  Per oncology recommendation for monitoring of platelets today.  Plan is to keep platelets over 50,000.  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  Patient continue to follow-up with oncology as an outpatient.  Stage III mantle cell lymphoma: Patient is currently off therapy.Oncology following.  Severe protein calorie malnutrition:Continue supplement nutrition with Ensure.Followed by dietitian.  Klebsiella pneumoniae PJS:RPRXYVO on admissi regular diet diet on.  Patient was treated with IV Rocephin and switched to oral Keflex.  Antibiotic switched to IV Zosyn because of recurrence of fever.    Mobility: Ambulates independently to the bathroom.  Encouraged to ambulate on the hallway Diet: Regular diet DVT prophylaxis: SCDs Code Status:  Code Status: Full Code  Family Communication: Daughter was at bedside Disposition Plan: Ultimate plan for home with home health care, likely tomorrow.  Consultants:  GI  Oncology  Procedures:  EGD on 1/29  Antimicrobials:  Antimicrobial Start date End date  . IV Rocephin  05/19/2018  05/23/2018  . Oral Keflex  05/13/2018  05/26/2018  . IV Zosyn  05/25/2018  continue   Infusions:  . sodium chloride Stopped (05/25/18 1424)  . piperacillin-tazobactam (ZOSYN)  IV 3.375 g (05/26/18 4174)    Scheduled Meds: . sodium chloride    Intravenous Once  . feeding supplement (ENSURE ENLIVE)  237 mL Oral BID BM  . mouth rinse  15 mL Mouth Rinse BID  . pantoprazole  40 mg Oral BID  . peg 3350 powder  0.5 kit Oral Once  . senna-docusate  1 tablet Oral BID    PRN meds: sodium chloride, acetaminophen **OR** acetaminophen, ondansetron **OR** ondansetron (ZOFRAN) IV   Objective: Vitals:   05/25/18 2200 05/26/18 0518  BP:  123/75  Pulse:  67  Resp:  17  Temp: 98.7 F (37.1 C) 98.5 F (36.9 C)  SpO2:  98%    Intake/Output Summary (Last 24 hours) at 05/26/2018 1053 Last data filed at 05/26/2018 0647 Gross per 24 hour  Intake 85.01 ml  Output 200 ml  Net -114.99 ml   Filed Weights   05/16/18 1722 05/21/18 0926  Weight: 52.6 kg 50 kg   Body mass index is 17.26 kg/m.   Physical Exam: GENERAL: Pleasant elderly African-American male.  Not in distress, thin built HENT: No scleral pallor or icterus. Pupils equally reactive to light. Oral mucosa is moist NECK: is supple, no palpable thyroid enlargement. CHEST: Clear to auscultation. No crackles or wheezes.  CVS: S1 and S2 heard, no murmur. Regular rate and rhythm. No pericardial rub. ABDOMEN: Soft, non-tender, bowel sounds are present. No palpable hepato-splenomegaly. EXTREMITIES: No edema. CNS: Alert, awake, oriented x3 SKIN: warm and dry without rashes.  Data Review: I have personally reviewed the laboratory data and studies available.  CBC Latest Ref Rng & Units 05/26/2018 05/25/2018 05/24/2018  WBC 4.0 - 10.5 K/uL 1.9(L) 1.6(L) 2.2(L)  Hemoglobin 13.0 - 17.0 g/dL 7.8(L) 8.9(L) 8.2(L)  Hematocrit 39.0 - 52.0 % 25.0(L) 28.4(L) 25.9(L)  Platelets 150 - 400 K/uL 35(L) 54(L) 67(L)   BMP Latest Ref Rng & Units 05/26/2018 05/25/2018 05/21/2018  Glucose 70 - 99 mg/dL 117(H) 169(H) 97  BUN 8 - 23 mg/dL 23 23 38(H)  Creatinine 0.61 - 1.24 mg/dL 1.20 1.17 1.31(H)  BUN/Creat Ratio 10 - 24 - - -  Sodium 135 - 145 mmol/L 142 142 144  Potassium 3.5 - 5.1 mmol/L 3.5 3.2(L) 3.5    Chloride 98 - 111 mmol/L 108 108 112(H)  CO2 22 - 32 mmol/L _0 Calcium 8.9 - 10.3 mg/dL 8.5(L) 8.9 8.6(L)    Terrilee Croak, MD  [facility] 05/26/2018

## 2018-05-26 NOTE — Care Management Important Message (Signed)
Important Message  Patient Details  Name: Artist Bloom MRN: [id number] Date of Birth: 1940/12/16   Medicare Important Message Given:  Yes    [name] 05/26/2018, 10:47 AMImportant Message  Patient Details  Name: Navy Belay MRN: [id number] Date of Birth: 16-Sep-1940   Medicare Important Message Given:  Yes    [name] 05/26/2018, 10:47 AM

## 2018-05-26 NOTE — Progress Notes (Signed)
Pt's temperature was rechecked after PRN tylenol was given and it returned to 99. Doctor was informed would like to continue with the transfusion.

## 2018-05-27 LAB — CBC WITH DIFFERENTIAL/PLATELET
Abs Immature Granulocytes: 0.12 10*3/uL — ABNORMAL HIGH (ref 0.00–0.07)
BASOS ABS: 0 10*3/uL (ref 0.0–0.1)
Basophils Relative: 0 %
Eosinophils Absolute: 0 10*3/uL (ref 0.0–0.5)
Eosinophils Relative: 0 %
HCT: 26.3 % — ABNORMAL LOW (ref 39.0–52.0)
Hemoglobin: 8.4 g/dL — ABNORMAL LOW (ref 13.0–17.0)
Immature Granulocytes: 7 %
Lymphocytes Relative: 28 %
Lymphs Abs: 0.5 10*3/uL — ABNORMAL LOW (ref 0.7–4.0)
MCH: 28.2 pg (ref 26.0–34.0)
MCHC: 31.9 g/dL (ref 30.0–36.0)
MCV: 88.3 fL (ref 80.0–100.0)
Monocytes Absolute: 0.2 10*3/uL (ref 0.1–1.0)
Monocytes Relative: 10 %
Neutro Abs: 1 10*3/uL — ABNORMAL LOW (ref 1.7–7.7)
Neutrophils Relative %: 55 %
Platelets: 58 10*3/uL — ABNORMAL LOW (ref 150–400)
RBC: 2.98 MIL/uL — AB (ref 4.22–5.81)
RDW: 14.5 % (ref 11.5–15.5)
WBC: 1.9 10*3/uL — AB (ref 4.0–10.5)
nRBC: 2.2 % — ABNORMAL HIGH (ref 0.0–0.2)

## 2018-05-27 LAB — PREPARE PLATELET PHERESIS: Unit division: 0

## 2018-05-27 LAB — BPAM RBC
Blood Product Expiration Date: 202003022359
ISSUE DATE / TIME: 202002031509
Unit Type and Rh: 5100

## 2018-05-27 LAB — BPAM PLATELET PHERESIS
Blood Product Expiration Date: 202002052359
ISSUE DATE / TIME: 202002032034
Unit Type and Rh: 5100

## 2018-05-27 LAB — BASIC METABOLIC PANEL
Anion gap: 10 (ref 5–15)
BUN: 23 mg/dL (ref 8–23)
CO2: 24 mmol/L (ref 22–32)
Calcium: 8.4 mg/dL — ABNORMAL LOW (ref 8.9–10.3)
Chloride: 107 mmol/L (ref 98–111)
Creatinine, Ser: 1.09 mg/dL (ref 0.61–1.24)
GFR calc Af Amer: >60 mL/min (ref >60)
GFR calc non Af Amer: >60 mL/min (ref >60)
Glucose, Bld: 123 mg/dL — ABNORMAL HIGH (ref 70–99)
Potassium: 3.7 mmol/L (ref 3.5–5.1)
Sodium: 141 mmol/L (ref 135–145)

## 2018-05-27 LAB — TYPE AND SCREEN
ABO/RH(D): O POS
Antibody Screen: NEGATIVE
Unit division: 0

## 2018-05-27 MED ORDER — SENNOSIDES-DOCUSATE SODIUM 8.6-50 MG PO TABS: 1.0000 | ORAL_TABLET | Freq: Two times a day (BID) | ORAL | 0 refills | Status: DC

## 2018-05-27 MED ORDER — PANTOPRAZOLE SODIUM 40 MG PO TBEC: 40.0000 mg | DELAYED_RELEASE_TABLET | Freq: Two times a day (BID) | ORAL | 0 refills | Status: DC

## 2018-05-27 MED ORDER — AMOXICILLIN-POT CLAVULANATE 875-125 MG PO TABS: 1.0000 | ORAL_TABLET | Freq: Two times a day (BID) | ORAL | 0 refills | Status: DC

## 2018-05-27 NOTE — Progress Notes (Signed)
Pt was given discharge instructions with no immediate questions and concerns. Pt legal guardian, his wife,and his daughter were both informed of him being discharged and reviewed the discharge instructions as well. The pt will be taken downstairs via wheelchair.

## 2018-05-27 NOTE — Care Management (Signed)
This CM spoke with wife as a follow up to dc planning. She finally decided on [facility] for home health services. RW and 3in1 also ordered. [facility] rep alerted of referral and DME needs. Marney Doctor RN,BSN [phone]

## 2018-05-27 NOTE — Discharge Summary (Signed)
Physician Discharge Summary  Joel [name] IWP:[id number] DOB: 1941-02-05 DOA: 05/16/2018  PCP: [name], MD  Admit date: 05/16/2018 Discharge date: 05/27/2018  Admitted From: Home Discharge disposition: Home with home health  Recommendations for Outpatient Follow-Up:   1. 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 Diet recommendation: Regular diet Wound care: None Code status: Full.  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  Patient did endorse black stool 3 days prior to admission however none since then. Patient wasalso noted to be severely pancytopenicwith a hemoglobin at 5.7.Patient wastransfused 8 units of PRBCs and 2 unit of platelets. His hospital course got prolonged also because of intermittent fever spikes.  Hospital Course:   Acute blood loss anemia due to GI bleeding: Presented with symptomatic anemiawith hemoglobin of 5.7. Underwent EGD on 05/21/2018. Found to have mild nonspecific gastritis and bulbar duodenitis. Patient also had nonbleeding gastric AVMs that were treated with APC. Continue  on oral PPI twice daily.Received total of 8 units of PRBC and 2 units of platelets .  Thrombocytopenia -platelet level improved to 58,000 today.   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  Patient continue to follow-up with oncology as an outpatient.  Fever-for last 3-4  days, patient has been having spikes of fever up to 100.8 and only in the evening.  No other symptoms or signs of infection. Chest x-ray negative. WBC count low secondary to bone marrow suppression.  Repeat blood culture was sent on 2/2 which did not show any growth.  Patient likes to keep his room is warm.  I wonder if temperature spike is better warm room.  However, because of immunosuppressed status, patient was presumptively started on IV Zosyn.  I will switch to oral Augmentin at discharge for the next 5 days.   Stage III mantle cell lymphoma: Patient is currently off therapy.Oncology following.  Severe protein calorie malnutrition:Continue supplement nutrition with Ensure.Followed by dietitian.  Klebsiella pneumoniae NKN:[id number] on admission.  Antibiotic treatment started.  Medical Consultants:   GI, oncology  Discharge Exam:   Vitals:   05/26/18 2104 05/26/18 2201  BP: 116/77 (!) 144/84  Pulse: 79 86  Resp:  17  Temp: 98.8 F (37.1 C) 99.6 F (37.6 C)  SpO2: 99%    Vitals:   05/26/18 2030 05/26/18 2047 05/26/18 2104 05/26/18 2201  BP: 120/80 120/80 116/77 (!) 144/84  Pulse: 85 87 79 86  Resp: (!) 22 (!) 22  17  Temp: 99.2 F (37.3 C) 99.2 F (37.3 C) 98.8 F (37.1 C) 99.6 F (37.6 C)  TempSrc: Oral Oral Oral Oral  SpO2:  99% 99%   Weight:  Height:        General exam: Appears calm and comfortable.  Propped up in bed.  Not in distress.  Ambulates independently to bathroom. Respiratory system: Clear to auscultation. Respiratory effort normal. Cardiovascular system:  S1 & S2 heard, RRR. No JVD,  rubs, gallops or clicks. No murmurs. Gastrointestinal system: Abdomen is nondistended, soft and nontender. No organomegaly or masses felt. Normal bowel sounds heard. Central nervous system: Alert and oriented. No focal neurological deficits. Extremities: No clubbing,  or cyanosis. No edema. Skin: No rashes, lesions or ulcers. Psychiatry: Judgement and insight appear normal. Mood & affect appropriate.    The results of significant diagnostics from this hospitalization (including imaging, microbiology, ancillary and laboratory) are listed below for reference.     Procedures and Diagnostic Studies:   Dg Chest Port 1 View  Result Date: 05/17/2018 CLINICAL DATA:  History of prostate cancer, status post resection of a RIGHT neck mass with palliative radiation chemotherapy. Increased lethargy for the past 2 weeks. EXAM: PORTABLE CHEST 1 VIEW COMPARISON:  Chest x-rays dated 05/16/2018 and 03/20/2018. FINDINGS: Heart size and mediastinal contours are grossly stable in size and configuration. Stable mild atelectasis within the LEFT perihilar lung. No new lung findings. No pleural effusion or pneumothorax seen. Osseous structures about the chest are unremarkable. IMPRESSION: No active disease.  No evidence of pneumonia or pulmonary edema. Electronically Signed   By: Franki Cabot M.D.   On: 05/17/2018 09:04   Dg Chest Port 1 View  Result Date: 05/16/2018 CLINICAL DATA:  Low hemoglobin. Lymphoma. Nausea vomiting. Frequent transfusions. EXAM: PORTABLE CHEST 1 VIEW COMPARISON:  Chest x-ray 03/20/2018, 07/07/2015.  PET-CT 04/25/2017. FINDINGS: Mediastinum hilar structures normal. Left perihilar atelectasis. No focal alveolar infiltrate. Stable mild bilateral pleural thickening consistent scarring. No pneumothorax. Cardiomegaly with normal pulmonary vascularity. Degenerative change thoracic spine. IMPRESSION: 1.  Left perihilar atelectasis. 2.  Cardiomegaly.  No pulmonary venous  congestion. Electronically Signed   By: Marcello Moores  Register   On: 05/16/2018 14:41     Labs:   Basic Metabolic Panel: Recent Labs  Lab 05/21/18 0609 05/25/18 0843 05/26/18 0511 05/27/18 0332  NA 144 142 142 141  K 3.5 3.2* 3.5 3.7  CL 112* 108 108 107  CO2 24 25 25 24   GLUCOSE 97 169* 117* 123*  BUN 38* 23 23 23   CREATININE 1.31* 1.17 1.20 1.09  CALCIUM 8.6* 8.9 8.5* 8.4*   GFR Estimated Creatinine Clearance: 41.4 mL/min (by C-G formula based on SCr of 1.09 mg/dL). Liver Function Tests: Recent Labs  Lab 05/21/18 0609  AST 22  ALT 14  ALKPHOS 53  BILITOT 0.7  PROT 5.2*  ALBUMIN 2.3*   No results for input(s): LIPASE, AMYLASE in the last 168 hours. No results for input(s): AMMONIA in the last 168 hours. Coagulation profile No results for input(s): INR, PROTIME in the last 168 hours.  CBC: Recent Labs  Lab 05/22/18 0349 05/23/18 0305  05/24/18 0230 05/24/18 1017 05/25/18 0843 05/26/18 0511 05/27/18 0332  WBC 2.2* 2.2*   < > 2.4* 2.2* 1.6* 1.9* 1.9*  NEUTROABS 1.3* 1.3*  --   --   --  1.1* 1.0* 1.0*  HGB 8.9* 9.1*   < > 9.7* 8.2* 8.9* 7.8* 8.4*  HCT 27.4* 28.0*   < > 31.0* 25.9* 28.4* 25.0* 26.3*  MCV 86.2 88.1   < > 87.8 88.4 87.7 89.9 88.3  PLT 75* 60*   < > 74* 67* 54* 35* 58*   < > = values in  this interval not displayed.   Cardiac Enzymes: No results for input(s): CKTOTAL, CKMB, CKMBINDEX, TROPONINI in the last 168 hours. BNP: Invalid input(s): POCBNP CBG: Recent Labs  Lab 05/23/18 1433  GLUCAP 123*   D-Dimer No results for input(s): DDIMER in the last 72 hours. Hgb A1c No results for input(s): HGBA1C in the last 72 hours. Lipid Profile No results for input(s): CHOL, HDL, LDLCALC, TRIG, CHOLHDL, LDLDIRECT in the last 72 hours. Thyroid function studies No results for input(s): TSH, T4TOTAL, T3FREE, THYROIDAB in the last 72 hours.  Invalid input(s): FREET3 Anemia work up No results for input(s): VITAMINB12, FOLATE, FERRITIN, TIBC, IRON,  RETICCTPCT in the last 72 hours. Microbiology Recent Results (from the past 240 hour(s))  Culture, blood (Routine X 2) w Reflex to ID Panel     Status: None (Preliminary result)   Collection Time: 05/25/18  8:43 AM  Result Value Ref Range Status   Specimen Description   Final    BLOOD RIGHT ANTECUBITAL Performed at [facility] [address] [place], [place] [address]    Special Requests   Final    BOTTLES DRAWN AEROBIC AND ANAEROBIC Blood Culture adequate volume Performed at [facility] [address] [place], [place] [address]    Culture   Final    NO GROWTH 2 DAYS Performed at [facility] [address], [place], [place] [address]    Report Status PENDING  Incomplete  Culture, blood (Routine X 2) w Reflex to ID Panel     Status: None (Preliminary result)   Collection Time: 05/25/18  8:47 AM  Result Value Ref Range Status   Specimen Description   Final    BLOOD LEFT HAND Performed at [facility] [address] [place], [place] [address]    Special Requests   Final    BOTTLES DRAWN AEROBIC AND ANAEROBIC Blood Culture adequate volume Performed at [facility] [address], [place], [place] [address]    Culture   Final    NO GROWTH 2 DAYS Performed at [facility] [address] [place], [place] [address]    Report Status PENDING  Incomplete     Discharge Instructions:    Allergies as of 05/27/2018   No Known Allergies     Medication List    TAKE these medications   amoxicillin-clavulanate 875-125 MG tablet Commonly known as:  AUGMENTIN Take 1 tablet by mouth every 12 (twelve) hours for 5 days.   feeding supplement (ENSURE ENLIVE) Liqd Take 237 mLs by mouth 2 (two) times daily between meals.   megestrol 400 MG/10ML suspension Commonly known as:  MEGACE Take 10 mLs (400 mg total) by mouth 2 (two) times daily.   pantoprazole 40 MG tablet Commonly known as:   PROTONIX Take 1 tablet (40 mg total) by mouth 2 (two) times daily for 30 days.   senna-docusate 8.6-50 MG tablet Commonly known as:  Senokot-S Take 1 tablet by mouth 2 (two) times daily for 30 days.            Durable Medical Equipment  (From admission, onward)         Start     Ordered   05/27/18 0950  For home use only DME 3 n 1  Once     05/27/18 0950   05/27/18 0950  For home use only DME Walker rolling  Once    Question:  Patient needs a walker to treat  with the following condition  Answer:  Weakness   05/27/18 0950         Follow-up Information    Shawnie Dapper .   Contact information: [place] [address] [phone]        [facility] Follow [facility].   Specialty:  Home Health Services Why:  For home health services Contact information: [address] [place] [address] [phone]            Time coordinating discharge: 39 minutes  Signed:  Marlowe Aschoff Quanda Pavlicek  [facility] 05/27/2018, 11:13 AM

## 2018-05-27 NOTE — Progress Notes (Signed)
Occupational Therapy Treatment Patient Details Name: Joel [name] MRN: [id number] DOB: 08-05-1940 Today's Date: 05/27/2018    History of present illness Pt is a 78 y.o. M with significant PMH of stroke, mantle cell lymphoma, prostate CA, anemia, CKD-4, who presents with critical Hgb 2.9. and generalized weakness.    OT comments  Educated wife pt will need close S- min A at home for ADL activity and getting around the home.    Follow Up Recommendations  Home health OT;Supervision/Assistance - 24 hour    Equipment Recommendations  3 in 1 bedside commode    Recommendations for Other Services      Precautions / Restrictions Precautions Precautions: Fall Restrictions Weight Bearing Restrictions: No       Mobility Bed Mobility Overal bed mobility: Modified Independent             General bed mobility comments: increased time  Transfers Overall transfer level: Needs assistance Equipment used: Rolling walker (2 wheeled) Transfers: Sit to/from [facility] Sit to Stand: Min assist Stand pivot transfers: Min assist       General transfer comment: VCs hand placement    Balance Overall balance assessment: Needs assistance Sitting-balance support: No upper extremity supported Sitting balance-Leahy Scale: Good     Standing balance support: During functional activity;No upper extremity supported Standing balance-Leahy Scale: Fair                             ADL either performed or assessed with clinical judgement   ADL   Eating/Feeding: Set up;Sitting                       Toilet Transfer: Minimal assistance;Ambulation;RW Toilet Transfer Details (indicate cue type and reason): bed to chair           General ADL Comments: Discussed ADL activity with pts wife upon DC.  Explained he would need A with ADL activity.  Pts wife stated he was I at home.  OT explained he was a bit weaker and needed A getting to chair so would need A  at home.Wife verbalized understanding               Cognition Arousal/Alertness: Awake/alert Behavior During Therapy: WFL for tasks assessed/performed Overall Cognitive Status: Within Functional Limits for tasks assessed                                                     Pertinent Vitals/ Pain       Pain Assessment: No/denies pain  Home Living Family/patient expects to be discharged to:: Private residence Living Arrangements: Spouse/significant other;Children(dtr) Available Help at Discharge: Family;Available 24 hours/day Type of Home: House Home Access: Stairs to enter [employer] of Steps: 3 Entrance Stairs-Rails: None Home Layout: One level     Bathroom Shower/Tub: Teacher, early years/pre: Standard     Home Equipment: Shower seat          Prior Functioning/Environment Level of Independence: Independent            Frequency  Min 2X/week        Progress Toward Goals  OT Goals(current goals can now be found in the care plan section)  Progress towards OT goals: Progressing toward goals  Plan Discharge plan remains appropriate       AM-PAC OT "6 Clicks" Daily Activity     Outcome Measure   Help from another person eating meals?: None Help from another person taking care of personal grooming?: None Help from another person toileting, which includes using toliet, bedpan, or urinal?: A Little Help from another person bathing (including washing, rinsing, drying)?: A Little Help from another person to put on and taking off regular upper body clothing?: A Little Help from another person to put on and taking off regular lower body clothing?: A Little 6 Click Score: 20    End of Session Equipment Utilized During Treatment: Other (comment);Rolling walker(theraband)  OT Visit Diagnosis: Muscle weakness (generalized) (M62.81)   Activity Tolerance Patient tolerated treatment well   Patient Left with call  bell/phone within reach;with family/visitor present;in bed   Nurse Communication Mobility status        Time: 1245-1300 OT Time Calculation (min): 15 min  Charges: OT General Charges $OT Visit: 1 Visit OT Treatments $Self Care/Home Management : 8-22 mins  Kari Baars, [place] [phone] Office- [place], Edwena Felty D 05/27/2018, 2:03 PM

## 2018-05-30 ENCOUNTER — Encounter (HOSPITAL_COMMUNITY): Payer: Self-pay

## 2018-05-30 ENCOUNTER — Inpatient Hospital Stay (HOSPITAL_COMMUNITY)
Admission: EM | Admit: 2018-05-30 | Discharge: 2018-06-05 | DRG: 377 | Disposition: A | Discharge status: Home Health | Payer: Medicare HMO | Attending: Internal Medicine | Admitting: Internal Medicine

## 2018-05-30 ENCOUNTER — Other Ambulatory Visit: Payer: Self-pay

## 2018-05-30 ENCOUNTER — Telehealth: Payer: Self-pay

## 2018-05-30 ENCOUNTER — Telehealth: Payer: Self-pay | Admitting: Oncology

## 2018-05-30 ENCOUNTER — Inpatient Hospital Stay: Payer: Medicare HMO | Attending: Oncology

## 2018-05-30 DIAGNOSIS — D6489 Other specified anemias: Secondary | ICD-10-CM

## 2018-05-30 DIAGNOSIS — K922 Gastrointestinal hemorrhage, unspecified: Secondary | ICD-10-CM | POA: Diagnosis not present

## 2018-05-30 DIAGNOSIS — D120 Benign neoplasm of cecum: Secondary | ICD-10-CM | POA: Diagnosis present

## 2018-05-30 DIAGNOSIS — D61818 Other pancytopenia: Secondary | ICD-10-CM | POA: Diagnosis present

## 2018-05-30 DIAGNOSIS — C8311 Mantle cell lymphoma, lymph nodes of head, face, and neck: Secondary | ICD-10-CM | POA: Diagnosis present

## 2018-05-30 DIAGNOSIS — K644 Residual hemorrhoidal skin tags: Secondary | ICD-10-CM | POA: Diagnosis present

## 2018-05-30 DIAGNOSIS — Z681 Body mass index (BMI) 19 or less, adult: Secondary | ICD-10-CM | POA: Diagnosis not present

## 2018-05-30 DIAGNOSIS — Z923 Personal history of irradiation: Secondary | ICD-10-CM | POA: Diagnosis not present

## 2018-05-30 DIAGNOSIS — N179 Acute kidney failure, unspecified: Secondary | ICD-10-CM | POA: Diagnosis present

## 2018-05-30 DIAGNOSIS — Z9079 Acquired absence of other genital organ(s): Secondary | ICD-10-CM | POA: Diagnosis not present

## 2018-05-30 DIAGNOSIS — N183 Chronic kidney disease, stage 3 (moderate): Secondary | ICD-10-CM | POA: Diagnosis present

## 2018-05-30 DIAGNOSIS — K648 Other hemorrhoids: Secondary | ICD-10-CM | POA: Diagnosis present

## 2018-05-30 DIAGNOSIS — D123 Benign neoplasm of transverse colon: Secondary | ICD-10-CM | POA: Diagnosis present

## 2018-05-30 DIAGNOSIS — K254 Chronic or unspecified gastric ulcer with hemorrhage: Secondary | ICD-10-CM | POA: Diagnosis present

## 2018-05-30 DIAGNOSIS — K5521 Angiodysplasia of colon with hemorrhage: Secondary | ICD-10-CM | POA: Diagnosis present

## 2018-05-30 DIAGNOSIS — Z9221 Personal history of antineoplastic chemotherapy: Secondary | ICD-10-CM

## 2018-05-30 DIAGNOSIS — Z87891 Personal history of nicotine dependence: Secondary | ICD-10-CM

## 2018-05-30 DIAGNOSIS — K31819 Angiodysplasia of stomach and duodenum without bleeding: Secondary | ICD-10-CM | POA: Diagnosis present

## 2018-05-30 DIAGNOSIS — Z79899 Other long term (current) drug therapy: Secondary | ICD-10-CM

## 2018-05-30 DIAGNOSIS — K921 Melena: Secondary | ICD-10-CM | POA: Diagnosis present

## 2018-05-30 DIAGNOSIS — E43 Unspecified severe protein-calorie malnutrition: Secondary | ICD-10-CM | POA: Diagnosis present

## 2018-05-30 DIAGNOSIS — Z8546 Personal history of malignant neoplasm of prostate: Secondary | ICD-10-CM | POA: Diagnosis not present

## 2018-05-30 DIAGNOSIS — Z515 Encounter for palliative care: Secondary | ICD-10-CM | POA: Diagnosis not present

## 2018-05-30 DIAGNOSIS — C8310 Mantle cell lymphoma, unspecified site: Secondary | ICD-10-CM | POA: Diagnosis not present

## 2018-05-30 DIAGNOSIS — N390 Urinary tract infection, site not specified: Secondary | ICD-10-CM | POA: Diagnosis present

## 2018-05-30 DIAGNOSIS — Z7189 Other specified counseling: Secondary | ICD-10-CM | POA: Diagnosis not present

## 2018-05-30 DIAGNOSIS — C61 Malignant neoplasm of prostate: Secondary | ICD-10-CM | POA: Diagnosis not present

## 2018-05-30 DIAGNOSIS — D649 Anemia, unspecified: Secondary | ICD-10-CM

## 2018-05-30 LAB — CBC WITH DIFFERENTIAL (CANCER CENTER ONLY)
Abs Immature Granulocytes: 0.18 10*3/uL — ABNORMAL HIGH (ref 0.00–0.07)
Basophils Absolute: 0 10*3/uL (ref 0.0–0.1)
Basophils Relative: 0 %
Eosinophils Absolute: 0 10*3/uL (ref 0.0–0.5)
Eosinophils Relative: 0 %
HCT: 14.6 % — ABNORMAL LOW (ref 39.0–52.0)
Hemoglobin: 4.6 g/dL — CL (ref 13.0–17.0)
IMMATURE GRANULOCYTES: 11 %
LYMPHS PCT: 34 %
Lymphs Abs: 0.6 10*3/uL — ABNORMAL LOW (ref 0.7–4.0)
MCH: 28 pg (ref 26.0–34.0)
MCHC: 31.5 g/dL (ref 30.0–36.0)
MCV: 89 fL (ref 80.0–100.0)
Monocytes Absolute: 0.2 10*3/uL (ref 0.1–1.0)
Monocytes Relative: 10 %
Neutro Abs: 0.7 10*3/uL — ABNORMAL LOW (ref 1.7–7.7)
Neutrophils Relative %: 45 %
Platelet Count: 26 10*3/uL — ABNORMAL LOW (ref 150–400)
RBC: 1.64 MIL/uL — ABNORMAL LOW (ref 4.22–5.81)
RDW: 14.3 % (ref 11.5–15.5)
WBC Count: 1.6 10*3/uL — ABNORMAL LOW (ref 4.0–10.5)
nRBC: 4.3 % — ABNORMAL HIGH (ref 0.0–0.2)

## 2018-05-30 LAB — BASIC METABOLIC PANEL
Anion gap: 10 (ref 5–15)
BUN: 48 mg/dL — AB (ref 8–23)
CO2: 25 mmol/L (ref 22–32)
Calcium: 9.3 mg/dL (ref 8.9–10.3)
Chloride: 108 mmol/L (ref 98–111)
Creatinine, Ser: 1.15 mg/dL (ref 0.61–1.24)
GFR calc Af Amer: >60 mL/min (ref >60)
GFR calc non Af Amer: >60 mL/min (ref >60)
Glucose, Bld: 112 mg/dL — ABNORMAL HIGH (ref 70–99)
Potassium: 4.2 mmol/L (ref 3.5–5.1)
Sodium: 143 mmol/L (ref 135–145)

## 2018-05-30 LAB — CULTURE, BLOOD (ROUTINE X 2)
Culture: NO GROWTH
Culture: NO GROWTH
Special Requests: ADEQUATE
Special Requests: ADEQUATE

## 2018-05-30 LAB — SAMPLE TO BLOOD BANK

## 2018-05-30 LAB — PREPARE RBC (CROSSMATCH)

## 2018-05-30 MED ORDER — PANTOPRAZOLE SODIUM 40 MG IV SOLR
40.0000 mg | Freq: Once | INTRAVENOUS | Status: AC
  Administered 2018-05-30: 40 mg via INTRAVENOUS
  Filled 2018-05-30: qty 40

## 2018-05-30 MED ORDER — ONDANSETRON HCL 4 MG PO TABS: 4.0000 mg | ORAL_TABLET | Freq: Four times a day (QID) | ORAL | Status: DC | PRN

## 2018-05-30 MED ORDER — SODIUM CHLORIDE 0.9 % IV SOLN
10.0000 mL/h | Freq: Once | INTRAVENOUS | Status: AC
  Administered 2018-05-30: 10 mL/h via INTRAVENOUS

## 2018-05-30 MED ORDER — PANTOPRAZOLE SODIUM 40 MG IV SOLR
INTRAVENOUS | Status: AC
  Filled 2018-05-30: qty 40

## 2018-05-30 MED ORDER — PANTOPRAZOLE SODIUM 40 MG IV SOLR
40.0000 mg | Freq: Two times a day (BID) | INTRAVENOUS | Status: DC
  Administered 2018-05-30: 40 mg via INTRAVENOUS

## 2018-05-30 MED ORDER — SODIUM CHLORIDE 0.9 % IV SOLN
INTRAVENOUS | Status: DC
  Administered 2018-05-31 – 2018-06-01 (×4): via INTRAVENOUS

## 2018-05-30 MED ORDER — ACETAMINOPHEN 650 MG RE SUPP: 650.0000 mg | Freq: Four times a day (QID) | RECTAL | Status: DC | PRN

## 2018-05-30 MED ORDER — SODIUM CHLORIDE 0.9 % IV SOLN
8.0000 mg/h | INTRAVENOUS | Status: AC
  Administered 2018-05-30 – 2018-06-02 (×5): 8 mg/h via INTRAVENOUS
  Filled 2018-05-30 (×10): qty 80

## 2018-05-30 MED ORDER — SODIUM CHLORIDE 0.9 % IV SOLN
10.0000 mL/h | Freq: Once | INTRAVENOUS | Status: AC
  Administered 2018-06-01: 10 mL/h via INTRAVENOUS

## 2018-05-30 MED ORDER — ACETAMINOPHEN 325 MG PO TABS
650.0000 mg | ORAL_TABLET | Freq: Four times a day (QID) | ORAL | Status: DC | PRN
  Administered 2018-06-05: 650 mg via ORAL
  Filled 2018-05-30: qty 2

## 2018-05-30 MED ORDER — SODIUM CHLORIDE 0.9 % IV SOLN
INTRAVENOUS | Status: DC
  Administered 2018-05-30: 18:00:00 via INTRAVENOUS

## 2018-05-30 MED ORDER — ONDANSETRON HCL 4 MG/2ML IJ SOLN: 4.0000 mg | Freq: Four times a day (QID) | INTRAMUSCULAR | Status: DC | PRN

## 2018-05-30 MED ORDER — SODIUM CHLORIDE 0.9 % IV SOLN: 80.0000 mg | Freq: Once | INTRAVENOUS | Status: DC

## 2018-05-30 NOTE — H&P (Signed)
History and Physical    Harlem Bula ACZ:[id number] DOB: August 14, 1940 DOA: 05/30/2018  PCP: [name], MD  Patient coming from: Home.  Chief Complaint: Low hemoglobin.  HPI: Joel [name] is a 78 y.o. 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  During that stay last week patient had 8 units of PRBC transfusion along with platelet transfusion.  Patient states he did well after discharge but last 2 days noticed increasing black stools becoming more weak and lethargic and family had to call him earlier than his regular appointment with his physician and found hemoglobin was around 4.6 and was referred to the ER.  Denies any chest pain abdominal pain or diarrhea.  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  Patient not taking any blood thinners NSAIDs.  ED Course: In the ER patient was started on Protonix infusion.  Transfusion and PRBC transfusion has been ordered.  Patient at this time is hemodynamically stable.  Review of Systems: As per HPI, rest all negative.   Past Medical History:  Diagnosis Date  . Anemia   . Aneurysm ([place])    in 1987, brain aneurysm  . Bilateral hydronephrosis   . History of radiation therapy 04/17/17- 05/07/17   Right neck treated to 32.5 Gy with 13 fx of 2.5 Gy  . mantle cell lymphoma dx'd 03/2017  . Mass of right side of neck   . Pneumonia    hx of   . Prostate CA ([place]) dx'd 2017  . Urinary retention     Past Surgical History:  Procedure Laterality Date  . BIOPSY  05/21/2018   Procedure: BIOPSY;  Surgeon: Milus Banister, MD;  Location: [facility];   Service: Gastroenterology;;  . ESOPHAGOGASTRODUODENOSCOPY (EGD) WITH PROPOFOL N/A 05/21/2018   Procedure: ESOPHAGOGASTRODUODENOSCOPY (EGD) WITH PROPOFOL;  Surgeon: Milus Banister, MD;  Location: [facility];  Service: Gastroenterology;  Laterality: N/A;  . HOT HEMOSTASIS N/A 05/21/2018   Procedure: HOT HEMOSTASIS (ARGON PLASMA COAGULATION/BICAP);  Surgeon: Milus Banister, MD;  Location: [facility];  Service: Gastroenterology;  Laterality: N/A;  . MASS BIOPSY Right 03/27/2017   Procedure: OPEN NECK MASS BIOPSY OF THE RIGHT NECK;  Surgeon: Leta Baptist, MD;  Location: [facility];  Service: ENT;  Laterality: Right;  . PROSTATE BIOPSY N/A 07/30/2015   Procedure: PROSTATE BIOPSY;  Surgeon: Irine Seal, MD;  Location: [facility];  Service: Urology;  Laterality: N/A;  . TRANSURETHRAL RESECTION OF PROSTATE N/A 07/30/2015   Procedure: TRANSURETHRAL RESECTION OF THE PROSTATE (TURP);  Surgeon: Irine Seal, MD;  Location: [facility];  Service: Urology;  Laterality: N/A;     reports that he quit smoking about 33 years ago. He has quit using smokeless tobacco.  His smokeless tobacco use included chew. He reports that he does not drink alcohol or use drugs.  No Known Allergies  Family History  Problem Relation Age of Onset  . Dementia Mother   . Heart disease Father   . Lung cancer Brother   . Lupus Sister     Prior to Admission medications   Medication Sig Start Date End Date Taking? Authorizing Provider  amoxicillin-clavulanate (AUGMENTIN) 875-125 MG tablet Take  1 tablet by mouth every 12 (twelve) hours for 5 days. 05/27/18 06/01/18 Yes Dahal, Marlowe Aschoff, MD  feeding supplement, ENSURE ENLIVE, (ENSURE ENLIVE) LIQD Take 237 mLs by mouth 2 (two) times daily between meals. 03/22/18  Yes Elodia Florence., MD  pantoprazole (PROTONIX) 40 MG tablet Take 1 tablet (40 mg total) by mouth 2 (two) times daily for 30 days. 05/27/18 06/26/18 Yes Dahal, Marlowe Aschoff, MD  megestrol (MEGACE) 400 MG/10ML suspension Take 10 mLs (400 mg total) by  mouth 2 (two) times daily. Patient not taking: Reported on 05/30/2018 03/10/18   Wyatt Portela, MD  senna-docusate (SENOKOT-S) 8.6-50 MG tablet Take 1 tablet by mouth 2 (two) times daily for 30 days. Patient not taking: Reported on 05/30/2018 05/27/18 06/26/18  Terrilee Croak, MD    Physical Exam: Vitals:   05/30/18 2015 05/30/18 2030 05/30/18 2045 05/30/18 2100  BP: 121/80 120/83 117/75 118/73  Pulse: 96 97 97 82  Resp: (!) 21 (!) 22 20 17   Temp:      TempSrc:      SpO2: 100% 100% 100% 100%  Weight:      Height:          Constitutional: Moderately built and poorly nourished. Vitals:   05/30/18 2015 05/30/18 2030 05/30/18 2045 05/30/18 2100  BP: 121/80 120/83 117/75 118/73  Pulse: 96 97 97 82  Resp: (!) 21 (!) 22 20 17   Temp:      TempSrc:      SpO2: 100% 100% 100% 100%  Weight:      Height:       Eyes: Anicteric mild pallor. ENMT: No discharge from the ears eyes nose or mouth. Neck: No mass felt.  No neck rigidity. Respiratory: No rhonchi or crepitations. Cardiovascular: S1-S2 heard. Abdomen: Soft nontender bowel sounds present. Musculoskeletal: No edema.  No joint effusion. Skin: No rash. Neurologic: Alert awake oriented to time place and person.  Moves all extremities. Psychiatric: Appears normal.  Normal affect.   Labs on Admission: I have personally reviewed following labs and imaging studies  CBC: Recent Labs  Lab 05/24/18 1017 05/25/18 0843 05/26/18 0511 05/27/18 0332 05/30/18 1527  WBC 2.2* 1.6* 1.9* 1.9* 1.6*  NEUTROABS  --  1.1* 1.0* 1.0* 0.7*  HGB 8.2* 8.9* 7.8* 8.4* 4.6*  HCT 25.9* 28.4* 25.0* 26.3* 14.6*  MCV 88.4 87.7 89.9 88.3 89.0  PLT 67* 54* 35* 58* 26*   Basic Metabolic Panel: Recent Labs  Lab 05/25/18 0843 05/26/18 0511 05/27/18 0332 05/30/18 1639  NA 142 142 141 143  K 3.2* 3.5 3.7 4.2  CL 108 108 107 108  CO2 25 25 24 25   GLUCOSE 169* 117* 123* 112*  BUN 23 23 23  48*  CREATININE 1.17 1.20 1.09 1.15  CALCIUM 8.9 8.5* 8.4* 9.3     GFR: Estimated Creatinine Clearance: 39.3 mL/min (by C-G formula based on SCr of 1.15 mg/dL). Liver Function Tests: No results for input(s): AST, ALT, ALKPHOS, BILITOT, PROT, ALBUMIN in the last 168 hours. No results for input(s): LIPASE, AMYLASE in the last 168 hours. No results for input(s): AMMONIA in the last 168 hours. Coagulation Profile: No results for input(s): INR, PROTIME in the last 168 hours. Cardiac Enzymes: No results for input(s): CKTOTAL, CKMB, CKMBINDEX, TROPONINI in the last 168 hours. BNP (last 3 results) No results for input(s): PROBNP in the last 8760 hours. HbA1C: No results for input(s): HGBA1C in the last 72 hours. CBG: No results for input(s): GLUCAP in the last 168 hours. Lipid  Profile: No results for input(s): CHOL, HDL, LDLCALC, TRIG, CHOLHDL, LDLDIRECT in the last 72 hours. Thyroid Function Tests: No results for input(s): TSH, T4TOTAL, FREET4, T3FREE, THYROIDAB in the last 72 hours. Anemia Panel: No results for input(s): VITAMINB12, FOLATE, FERRITIN, TIBC, IRON, RETICCTPCT in the last 72 hours. Urine analysis:    Component Value Date/Time   COLORURINE YELLOW 05/17/2018 0757   APPEARANCEUR HAZY (A) 05/17/2018 0757   LABSPEC 1.013 05/17/2018 0757   PHURINE 6.0 05/17/2018 0757   GLUCOSEU NEGATIVE 05/17/2018 0757   HGBUR NEGATIVE 05/17/2018 0757   BILIRUBINUR NEGATIVE 05/17/2018 0757   BILIRUBINUR negative 07/05/2015 1801   KETONESUR NEGATIVE 05/17/2018 0757   PROTEINUR NEGATIVE 05/17/2018 0757   UROBILINOGEN 0.2 07/05/2015 1801   NITRITE NEGATIVE 05/17/2018 0757   LEUKOCYTESUR LARGE (A) 05/17/2018 0757   Sepsis Labs: @LABRCNTIP (procalcitonin:4,lacticidven:4) ) Recent Results (from the past 240 hour(s))  Culture, blood (Routine X 2) w Reflex to ID Panel     Status: None   Collection Time: 05/25/18  8:43 AM  Result Value Ref Range Status   Specimen Description   Final    BLOOD RIGHT ANTECUBITAL Performed at [facility],  [place] [address], [place], [place] [address]    Special Requests   Final    BOTTLES DRAWN AEROBIC AND ANAEROBIC Blood Culture adequate volume Performed at [place] [address] [place], [place] [address]    Culture   Final    NO GROWTH 5 DAYS Performed at [facility], [place] [address] [place], [place] [address]    Report Status 05/30/2018 FINAL  Final  Culture, blood (Routine X 2) w Reflex to ID Panel     Status: None   Collection Time: 05/25/18  8:47 AM  Result Value Ref Range Status   Specimen Description   Final    BLOOD LEFT HAND Performed at [place] [facility] [address] [place], [place] [address]    Special Requests   Final    BOTTLES DRAWN AEROBIC AND ANAEROBIC Blood Culture adequate volume Performed at [place] [address], [place], [place] [address]    Culture   Final    NO GROWTH 5 DAYS Performed at [facility], [place] [address] [place], [place] [address]    Report Status 05/30/2018 FINAL  Final     Radiological Exams on Admission: No results found.    Assessment/Plan Principal Problem:   Acute GI bleeding Active Problems:   Pancytopenia ([place])    1. Acute GI bleeding -patient has been placed on Protonix infusion.  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  EGD also showed nonspecific gastritis and bulbar duodenitis.  Given the patient's low platelets platelet transfusion has been ordered along with PRBC transfusion.  Note that patient required 8 units of PRBC during last admission.  Follow CBC after transfusion.  Labarge he had seen patient last time please consult in the morning. 2. Pancytopenia with acute worsening of anemia -pancytopenia likely from patient's known history of mantle cell lymphoma and use of chemotherapy.  Followed by oncologist.  For acute worsening of anemia patient receiving 2 units of PRBC and also platelet transfusion.  Follow CBC.   Patient not febrile at this time. 3. Mantle cell lymphoma and prostate cancer being followed by Dr. Alen Blew oncologist.   DVT prophylaxis: SCDs. Code Status: Full code. Family Communication: Daughter and wife at the bedside. Disposition Plan: Home. Consults called: None.  Admission status: Inpatient.   Rise Patience MD [facility] Pager [phone].  If 7PM-7AM, please contact night-coverage [email or web address] Password [facility]  05/30/2018, 9:28 PM

## 2018-05-30 NOTE — ED Provider Notes (Signed)
[facility] DEPT Provider Note   CSN: [id number] Arrival date & time: 05/30/18  1612     History   Chief Complaint Chief Complaint  Patient presents with  . Abnormal Lab    HPI Joel [name] is a 78 y.o. male.  HPI   He presents for evaluation of known anemia.  This is recurrent and felt to be from gastrointestinal source.  He had routine follow-up testing done today, hemoglobin was 4.6 (drawn at 1527, today) so he was sent here for further treatment.  Patient has been feeling weak, and this is progressively worse over the last couple of days.  He is continuing to black stool.  He has decreased appetite.  There are no other active modifying factors.  Past Medical History:  Diagnosis Date  . Anemia   . Aneurysm ([place])    in 1987, brain aneurysm  . Bilateral hydronephrosis   . History of radiation therapy 04/17/17- 05/07/17   Right neck treated to 32.5 Gy with 13 fx of 2.5 Gy  . mantle cell lymphoma dx'd 03/2017  . Mass of right side of neck   . Pneumonia    hx of   . Prostate CA ([place]) dx'd 2017  . Urinary retention     Patient Active Problem List   Diagnosis Date Noted  . Acute GI bleeding 05/30/2018  . Gastritis and gastroduodenitis   . AVM (arteriovenous malformation) of stomach, acquired   . Syncope 03/20/2018  . Hypotension 03/20/2018  . Pancytopenia ([place]) 03/20/2018  . Stroke ([place]) 04/22/2017  . Prostate cancer ([place]) 04/12/2017  . Mantle cell lymphoma of lymph nodes of head, face, and neck ([place]) 04/09/2017  . Bladder outlet obstruction 08/01/2015  . Anemia 07/29/2015  . Elevated serum creatinine   . Elevated prostate specific antigen (PSA) 07/28/2015  . Acute renal failure superimposed on stage 3 chronic kidney disease ([place]) 07/28/2015  . Urinary retention 07/28/2015  . AKI (acute kidney injury) ([place]) 07/08/2015  . Normocytic anemia 07/08/2015  . Hyperkalemia 07/08/2015  . Elevated blood pressure 07/08/2015  . Renal failure  07/08/2015  . Diarrhea 07/08/2015  . Protein-calorie malnutrition, severe ([place]) 07/08/2015  . Bilateral leg edema 07/08/2015    Past Surgical History:  Procedure Laterality Date  . BIOPSY  05/21/2018   Procedure: BIOPSY;  Surgeon: Milus Banister, MD;  Location: [facility];  Service: Gastroenterology;;  . ESOPHAGOGASTRODUODENOSCOPY (EGD) WITH PROPOFOL N/A 05/21/2018   Procedure: ESOPHAGOGASTRODUODENOSCOPY (EGD) WITH PROPOFOL;  Surgeon: Milus Banister, MD;  [facility];  Service: Gastroenterology;  Laterality: N/A;  . HOT HEMOSTASIS N/A 05/21/2018   Procedure: HOT HEMOSTASIS (ARGON PLASMA COAGULATION/BICAP);  Surgeon: Milus Banister, MD;  Location: [facility];  Service: Gastroenterology;  Laterality: N/A;  . MASS BIOPSY Right 03/27/2017   Procedure: OPEN NECK MASS BIOPSY OF THE RIGHT NECK;  Surgeon: Leta Baptist, MD;  Location: [facility];  Service: ENT;  Laterality: Right;  . PROSTATE BIOPSY N/A 07/30/2015   Procedure: PROSTATE BIOPSY;  Surgeon: Irine Seal, MD;  [facility];  Service: Urology;  Laterality: N/A;  . TRANSURETHRAL RESECTION OF PROSTATE N/A 07/30/2015   Procedure: TRANSURETHRAL RESECTION OF THE PROSTATE (TURP);  Surgeon: Irine Seal, MD;  [facility];  Service: Urology;  Laterality: N/A;        Home Medications    Prior to Admission medications   Medication Sig Start Date End Date Taking? Authorizing Provider  amoxicillin-clavulanate (AUGMENTIN) 875-125 MG tablet Take 1 tablet by mouth every 12 (  twelve) hours for 5 days. 05/27/18 06/01/18 Yes Dahal, Marlowe Aschoff, MD  feeding supplement, ENSURE ENLIVE, (ENSURE ENLIVE) LIQD Take 237 mLs by mouth 2 (two) times daily between meals. 03/22/18  Yes Elodia Florence., MD  pantoprazole (PROTONIX) 40 MG tablet Take 1 tablet (40 mg total) by mouth 2 (two) times daily for 30 days. 05/27/18 06/26/18 Yes Dahal, Marlowe Aschoff, MD  megestrol (MEGACE) 400 MG/10ML suspension Take 10 mLs (400 mg total) by mouth 2 (two) times daily. Patient not  taking: Reported on 05/30/2018 03/10/18   Wyatt Portela, MD  senna-docusate (SENOKOT-S) 8.6-50 MG tablet Take 1 tablet by mouth 2 (two) times daily for 30 days. Patient not taking: Reported on 05/30/2018 05/27/18 06/26/18  Terrilee Croak, MD    Family History Family History  Problem Relation Age of Onset  . Dementia Mother   . Heart disease Father   . Lung cancer Brother   . Lupus Sister     Social History Social History   Tobacco Use  . Smoking status: Former Smoker    Last attempt to quit: 04/28/1985    Years since quitting: 33.1  . Smokeless tobacco: Former Systems developer    Types: Chew  . Tobacco comment: quit 30-40 yrs. aog  Substance Use Topics  . Alcohol use: No    Alcohol/week: 0.0 standard drinks  . Drug use: No     Allergies   Patient has no known allergies.   Review of Systems Review of Systems  All other systems reviewed and are negative.    Physical Exam Updated Vital Signs BP 120/83   Pulse 97   Temp 98 F (36.7 C) (Oral)   Resp (!) 22   Ht 5\' 7"  (1.702 m)   Wt 50 kg   SpO2 100%   BMI 17.26 kg/m   Physical Exam Vitals signs and nursing note reviewed.  Constitutional:      General: He is not in acute distress.    Appearance: He is well-developed. He is ill-appearing. He is not toxic-appearing or diaphoretic.     Comments: Frail, elderly  HENT:     Head: Normocephalic and atraumatic.     Right Ear: External ear normal.     Left Ear: External ear normal.     Mouth/Throat:     Mouth: Mucous membranes are moist.     Pharynx: No oropharyngeal exudate or posterior oropharyngeal erythema.  Eyes:     Pupils: Pupils are equal, round, and reactive to light.     Comments: Pale conjunctiva  Neck:     Musculoskeletal: Normal range of motion and neck supple.     Trachea: Phonation normal.  Cardiovascular:     Rate and Rhythm: Normal rate and regular rhythm.     Heart sounds: Normal heart sounds.  Pulmonary:     Effort: Pulmonary effort is normal. No  respiratory distress.     Breath sounds: Normal breath sounds. No stridor. No rhonchi.  Abdominal:     Palpations: Abdomen is soft.     Tenderness: There is no abdominal tenderness.  Musculoskeletal: Normal range of motion.  Skin:    General: Skin is warm and dry.  Neurological:     Mental Status: He is alert and oriented to person, place, and time.     Cranial Nerves: No cranial nerve deficit.     Sensory: No sensory deficit.     Motor: No abnormal muscle tone.     Coordination: Coordination normal.  Psychiatric:  Mood and Affect: Mood normal.        Behavior: Behavior normal.      ED Treatments / Results  Labs (all labs ordered are listed, but only abnormal results are displayed) Labs Reviewed  BASIC METABOLIC PANEL - Abnormal; Notable for the following components:      Result Value   Glucose, Bld 112 (*)    BUN 48 (*)    All other components within normal limits  POC OCCULT BLOOD, ED  PREPARE RBC (CROSSMATCH)  TYPE AND SCREEN  PREPARE PLATELET PHERESIS    EKG None  Radiology No results found.  Procedures .Critical Care Performed by: Daleen Bo, MD Authorized by: Daleen Bo, MD   Critical care provider statement:    Critical care time (minutes):  75   Critical care start time:  05/30/2018 4:30 PM   Critical care end time:  05/30/2018 8:45 PM   Critical care time was exclusive of:  Separately billable procedures and treating other patients   Critical care was time spent personally by me on the following activities:  Blood draw for specimens, development of treatment plan with patient or surrogate, discussions with consultants, evaluation of patient's response to treatment, examination of patient, obtaining history from patient or surrogate, ordering and performing treatments and interventions, ordering and review of laboratory studies, pulse oximetry, re-evaluation of patient's condition, review of old charts and ordering and review of radiographic  studies   (including critical care time)  Medications Ordered in ED Medications  0.9 %  sodium chloride infusion ( Intravenous New Bag/Given 05/30/18 1803)  0.9 %  sodium chloride infusion (has no administration in time range)  pantoprazole (PROTONIX) 80 mg in sodium chloride 0.9 % 250 mL (0.32 mg/mL) infusion (has no administration in time range)  pantoprazole (PROTONIX) injection 40 mg (has no administration in time range)  0.9 %  sodium chloride infusion (10 mL/hr Intravenous New Bag/Given 05/30/18 1703)  pantoprazole (PROTONIX) injection 40 mg (40 mg Intravenous Given 05/30/18 2006)     Initial Impression / Assessment and Plan / ED Course  I have reviewed the triage vital signs and the nursing notes.  Pertinent labs & imaging results that were available during my care of the patient were reviewed by me and considered in my medical decision making (see chart for details).  Clinical Course as of May 31 2103  Fri May 30, 2018  1944 Normal except glucose high, BUN high  Basic metabolic panel(!) [EW]  2952 Discussed with unassigned gastroenterology on-call, Dr. Therisa Doyne, accepts patient as consultant.  She will see tomorrow.  She states he cannot have endoscopy until his platelets are improved.  She recommends IV Protonix drip.  She agrees with transfusion of packed blood cells and platelets.   [EW]    Clinical Course User Index [EW] Daleen Bo, MD     Patient Vitals for the past 24 hrs:  BP Temp Temp src Pulse Resp SpO2 Height Weight  05/30/18 2030 120/83 - - 97 (!) 22 100 % - -  05/30/18 2015 121/80 - - 96 (!) 21 100 % - -  05/30/18 2000 122/77 - - 87 19 100 % - -  05/30/18 1945 124/75 - - 82 20 100 % - -  05/30/18 1930 116/72 - - 79 20 100 % - -  05/30/18 1915 117/73 - - 82 19 100 % - -  05/30/18 1900 111/72 - - 84 19 100 % - -  05/30/18 1830 107/64 - - 84 14 100 % - -  05/30/18 1815 106/64 98 F (36.7 C) Oral 87 20 99 % - -  05/30/18 1800 102/63 - - 88 (!) 22 100 % - -    05/30/18 1757 101/66 97.9 F (36.6 C) Oral 91 (!) 21 - - -  05/30/18 1745 100/63 - - 91 (!) 21 100 % - -  05/30/18 1730 95/66 - - 85 19 100 % - -  05/30/18 1715 (!) 95/59 - - 88 (!) 22 100 % - -  05/30/18 1700 94/61 - - 86 (!) 21 100 % - -  05/30/18 1645 96/65 - - 86 (!) 21 100 % - -  05/30/18 1630 100/67 98 F (36.7 C) Oral 87 18 99 % - -  05/30/18 1629 - - - - - - 5\' 7"  (1.702 m) 50 kg    7:45 PM Reevaluation with update and discussion. After initial assessment and treatment, an updated evaluation reveals no change in clinical status.  He remains comfortable and cooperative.  Patient and family member, updated on findings and plan. Daleen Bo   Medical Decision Making: GI bleed, likely upper, status post recent treatment of AVMs, at upper endoscopy, 9 days ago.  BUN elevated consistent with upper GI source.  Expect rapid bleed, with loss of about 6 g, over 3 days.  Patient had recent large-volume transfusions with both red cells and platelets.  Patient hemodynamically stable.  History of prostate cancer and lymphoma, currently receiving Lupron as only chemotherapeutic agent.  He requires hospitalization for aggressive management.  CRITICAL CARE-yes Performed by: Daleen Bo  Nursing Notes Reviewed/ Care Coordinated Applicable Imaging Reviewed Interpretation of Laboratory Data incorporated into ED treatment   8:41 PM-Consult complete with hospitalist. Patient case explained and discussed.  He agrees to admit patient for further evaluation and treatment. Call ended at 8:50 PM  Plan: Admit   Final Clinical Impressions(s) / ED Diagnoses   Final diagnoses:  Gastrointestinal bleeding, lower  Anemia, unspecified type  Pancytopenia [facility])    ED Discharge Orders    None       Daleen Bo, MD 05/30/18 2106

## 2018-05-30 NOTE — ED Notes (Signed)
Blood bank called and said 2 units of blood ready for this pt. Notified Mandy,RN

## 2018-05-30 NOTE — Telephone Encounter (Signed)
Amy RN communicated Hgb 4.6, verbal order from Dr. Alen Blew to bring to ED. Contacted charge ED RN Erline Levine and patient assigned room 14. Transported patient to the ED via wheelchair with spouse, checked into ED and provided report to [facility].

## 2018-05-30 NOTE — ED Notes (Signed)
Report given to Joanne, RN

## 2018-05-30 NOTE — Telephone Encounter (Signed)
Scheduled appt per 2/7 sch message - unable to reach patient - left message

## 2018-05-30 NOTE — ED Notes (Signed)
Bed: RX45 Expected date:  Expected time:  Means of arrival:  [facility]

## 2018-05-30 NOTE — ED Notes (Signed)
ED TO INPATIENT HANDOFF REPORT  Name/Age/Gender Joel [name] 78 y.o. male  Code Status Code Status History    Date Active Date Inactive Code Status Order ID Comments User Context   05/16/2018 1707 05/27/2018 1745 Full Code [id number]  Reubin Milan, MD ED   03/20/2018 0200 03/21/2018 1951 Full Code [id number]  Ivor Costa, MD ED   07/28/2015 2119 08/01/2015 1831 Full Code [id number]  Ivor Costa, MD ED   07/08/2015 0053 07/13/2015 2315 Full Code [id number]  Ivor Costa, MD ED    Advance Directive Documentation     Most Recent Value  Type of Advance Directive  Healthcare Power of Attorney, Living will  Pre-existing out of facility DNR order (yellow form or pink MOST form)  -  "MOST" Form in Place?  -      Home/SNF/Other Home  Chief Complaint Hemoglobin is 4.6  Level of Care/Admitting Diagnosis ED Disposition    ED Disposition Condition [place]: [place] [100102]  Level of Care: Telemetry [5]  Admit to tele based on following criteria: Monitor for Ischemic changes  Diagnosis: Acute GI bleeding [443154]  Admitting Physician: Rise Patience [phone]  Attending Physician: Rise Patience [phone]  Estimated length of stay: past midnight tomorrow  Certification:: I certify this patient will need inpatient services for at least 2 midnights  PT Class (Do Not Modify): Inpatient [101]  PT Acc Code (Do Not Modify): Private [1]       Medical History Past Medical History:  Diagnosis Date  . Anemia   . Aneurysm ([place])    in 1987, brain aneurysm  . Bilateral hydronephrosis   . History of radiation therapy 04/17/17- 05/07/17   Right neck treated to 32.5 Gy with 13 fx of 2.5 Gy  . mantle cell lymphoma dx'd 03/2017  . Mass of right side of neck   . Pneumonia    hx of   . Prostate CA ([place]) dx'd 2017  . Urinary retention     Allergies No Known Allergies  IV Location/Drains/Wounds Patient Lines/Drains/Airways Status   Active  Line/Drains/Airways    Name:   Placement date:   Placement time:   Site:   Days:   Peripheral IV 05/30/18 Left Forearm   05/30/18    1700    Forearm   less than 1   Peripheral IV 05/30/18 Right Antecubital   05/30/18    1755    Antecubital   less than 1          Labs/Imaging Results for orders placed or performed during the hospital encounter of 05/30/18 (from the past 48 hour(s))  Type and screen [place]     Status: None (Preliminary result)   Collection Time: 05/30/18  3:28 PM  Result Value Ref Range   ABO/RH(D) O POS    Antibody Screen NEG    Sample Expiration 06/02/2018    Unit Number [id number]    Blood Component Type RED CELLS,LR    Unit division 00    Status of Unit ISSUED    Transfusion Status OK TO TRANSFUSE    Crossmatch Result      Compatible Performed at [facility], [place] [address], [place], [place] [address]    Unit Number [id number]    Blood Component Type RED CELLS,LR    Unit division 00    Status of Unit ALLOCATED    Transfusion Status OK TO TRANSFUSE    Crossmatch Result Compatible  Basic metabolic panel     Status: Abnormal   Collection Time: 05/30/18  4:39 PM  Result Value Ref Range   Sodium 143 135 - 145 mmol/L   Potassium 4.2 3.5 - 5.1 mmol/L   Chloride 108 98 - 111 mmol/L   CO2 25 22 - 32 mmol/L   Glucose, Bld 112 (H) 70 - 99 mg/dL   BUN 48 (H) 8 - 23 mg/dL   Creatinine, Ser 1.15 0.61 - 1.24 mg/dL   Calcium 9.3 8.9 - 10.3 mg/dL   GFR calc non Af Amer >60 >60 mL/min   GFR calc Af Amer >60 >60 mL/min   Anion gap 10 5 - 15    Comment: Performed at [facility], [place] [address] [place], [place] [address]  Prepare RBC     Status: None   Collection Time: 05/30/18  4:41 PM  Result Value Ref Range   Order Confirmation      ORDER PROCESSED BY BLOOD BANK Performed at [facility] [address] [place], [place] [address]    No results found.  Pending  Labs Unresulted Labs (From admission, onward)    Start     Ordered   05/30/18 2026  Prepare Pheresed Platelets  (Adult Blood Adminstration - Platelets (Pheresed))  Once,   R    Question Answer Comment  Number of Apheresis Units 1 unit (6-10 packs)   Transfusion Indications Transfuse   Date and time of surgery NA      05/30/18 2026          Vitals/Pain Today's Vitals   05/30/18 2015 05/30/18 2030 05/30/18 2045 05/30/18 2100  BP: 121/80 120/83 117/75 118/73  Pulse: 96 97 97 82  Resp: (!) 21 (!) 22 20 17   Temp:      TempSrc:      SpO2: 100% 100% 100% 100%  Weight:      Height:        Isolation Precautions No active isolations  Medications Medications  0.9 %  sodium chloride infusion ( Intravenous New Bag/Given 05/30/18 1803)  0.9 %  sodium chloride infusion (has no administration in time range)  pantoprazole (PROTONIX) 80 mg in sodium chloride 0.9 % 250 mL (0.32 mg/mL) infusion (8 mg/hr Intravenous New Bag/Given 05/30/18 2114)  pantoprazole (PROTONIX) injection 40 mg (40 mg Intravenous Given 05/30/18 2109)  pantoprazole (PROTONIX) 40 MG injection (has no administration in time range)  0.9 %  sodium chloride infusion (0 mL/hr Intravenous Stopped 05/30/18 2117)  pantoprazole (PROTONIX) injection 40 mg (40 mg Intravenous Given 05/30/18 2006)    Mobility walks with person assist

## 2018-05-30 NOTE — ED Triage Notes (Signed)
Patient brought in by cancer center staff. Patient has history of lymphoma but is not getting active chemo. Patient was seen at [facility] in January for hemoglobin of 2.8 and was told by the physician "he found a couple of red spots when they put the camera down my throat and he cauterized them." Patient states he is scheduled for a colonoscopy this week. Patient wife called cancer center staff today due to the patient experiencing weakness, pallor, and lethargy similar to his symptoms during previous admission. Cancer center staff tested patient hemoglobin and found it to be 4.6. Patient alert and oriented in triage, but very weak.

## 2018-05-31 LAB — CBC
HCT: 19.9 % — ABNORMAL LOW (ref 39.0–52.0)
Hemoglobin: 6.5 g/dL — CL (ref 13.0–17.0)
MCH: 29 pg (ref 26.0–34.0)
MCHC: 32.7 g/dL (ref 30.0–36.0)
MCV: 88.8 fL (ref 80.0–100.0)
Platelets: 22 10*3/uL — CL (ref 150–400)
RBC: 2.24 MIL/uL — AB (ref 4.22–5.81)
RDW: 13.9 % (ref 11.5–15.5)
WBC: 1 10*3/uL — CL (ref 4.0–10.5)
nRBC: 0 % (ref 0.0–0.2)

## 2018-05-31 LAB — BASIC METABOLIC PANEL
Anion gap: 9 (ref 5–15)
BUN: 44 mg/dL — ABNORMAL HIGH (ref 8–23)
CO2: 22 mmol/L (ref 22–32)
Calcium: 8.3 mg/dL — ABNORMAL LOW (ref 8.9–10.3)
Chloride: 113 mmol/L — ABNORMAL HIGH (ref 98–111)
Creatinine, Ser: 1.06 mg/dL (ref 0.61–1.24)
GFR calc Af Amer: >60 mL/min (ref >60)
GFR calc non Af Amer: >60 mL/min (ref >60)
Glucose, Bld: 94 mg/dL (ref 70–99)
Potassium: 3.6 mmol/L (ref 3.5–5.1)
Sodium: 144 mmol/L (ref 135–145)

## 2018-05-31 LAB — HEPATIC FUNCTION PANEL
ALT: 18 U/L (ref 0–44)
AST: 29 U/L (ref 15–41)
Albumin: 2.1 g/dL — ABNORMAL LOW (ref 3.5–5.0)
Alkaline Phosphatase: 66 U/L (ref 38–126)
BILIRUBIN INDIRECT: 0.5 mg/dL (ref 0.3–0.9)
Bilirubin, Direct: 0.1 mg/dL (ref 0.0–0.2)
Total Bilirubin: 0.6 mg/dL (ref 0.3–1.2)
Total Protein: 4.8 g/dL — ABNORMAL LOW (ref 6.5–8.1)

## 2018-05-31 LAB — PREPARE RBC (CROSSMATCH)

## 2018-05-31 MED ORDER — SODIUM CHLORIDE 0.9% IV SOLUTION: Freq: Once | INTRAVENOUS | Status: DC

## 2018-05-31 MED ORDER — AMOXICILLIN-POT CLAVULANATE 875-125 MG PO TABS
1.0000 | ORAL_TABLET | Freq: Two times a day (BID) | ORAL | Status: AC
  Administered 2018-05-31 – 2018-06-01 (×2): 1 via ORAL
  Filled 2018-05-31 (×2): qty 1

## 2018-05-31 NOTE — Progress Notes (Addendum)
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        GI  Bleed: ed doc Discussed with unassigned gastroenterology on-call, Dr. Therisa Doyne, accepts patient as consultant.  She will see tomorrow.  She states he cannot have endoscopy until his platelets are improved.  She recommends IV Protonix drip 2.  Urinary tract infection wife stated that patient was on amoxicillin at home and was supposed to and tomorrow for 01/11/2019 and he has not had any since he came in.  So I will start him on amoxicillin Augmentin 1 twice a day for today and tomorrow 3.  Pancytopenia with thrombocytopenia patient received platelet transfusion 4.  AVM causing his GI bleed as well continue to monitor he is on n.p.o. except clear liquids 5.  Mantle cell carcinoma and prostate cancer he is being followed by oncologist Dr. Alen Blew 6.  Neutropenia white count is 1.0 his has been put on neutropenic precaution       Code Status: Full  Severity of Illness: The appropriate patient status for this patient is INPATIENT. Inpatient status is judged to be reasonable and necessary in order to provide the required intensity of service to ensure the patient's safety. The  patient's presenting symptoms, physical exam findings, and initial radiographic and laboratory data in the context of their chronic comorbidities is felt to place them at high risk for further clinical deterioration. Furthermore, it is not anticipated that the patient will be medically stable for discharge from the hospital within 2 midnights of admission. The following factors support the patient status of inpatient.   " The patient's presenting symptoms include GI bleed. " The worrisome physical exam findings include GI bleed. " The initial radiographic and laboratory data are worrisome because of severe anemia of 4.6 requiring blood transfusion. " The chronic co-morbidities include cancer.   * I certify that at the point of admission it is my clinical judgment that the patient will require inpatient hospital care spanning beyond 2 midnights from the point of admission due to high intensity of service, high risk for further deterioration and high frequency of surveillance required.*    Family Communication: Wife Joel [name] at bedside  Disposition Plan: Home when stable   Consultants: gastroenterology on-call, Dr. Therisa Doyne  Procedures:  Blood transfusion  Antimicrobials:  Augmentin  DVT prophylaxis: SCD   Objective: Vitals:   05/31/18 0205 05/31/18 0341 05/31/18 0407 05/31/18 0621  BP: 131/71 124/81 123/71 125/70  Pulse: 72 97 88 74  Resp: 20 18 18 20   Temp: 98.7 F (37.1 C) 98.6 F (37 C) 98.4 F (36.9 C) 98.2 F (36.8 C)  TempSrc:  Oral Oral Oral  SpO2:  100%  100%  Weight:  Height:        Intake/Output Summary (Last 24 hours) at 05/31/2018 1040 Last data filed at 05/31/2018 0853 Gross per 24 hour  Intake 1258.28 ml  Output -  Net 1258.28 ml   Filed Weights   05/30/18 1629 05/30/18 2200  Weight: 50 kg 54.1 kg   Body mass index is 18.67 kg/m.  Exam:  . General: 78 y.o. year-old male well developed malnourished cachectic, in no acute distress.  Alert and  oriented x3. . Cardiovascular: Regular rate and rhythm with no rubs or gallops.  No thyromegaly or JVD noted.   Marland Kitchen Respiratory: Clear to auscultation with no wheezes or rales. Good inspiratory effort. . Abdomen: Soft nontender nondistended with normal bowel sounds x4 quadrants. . Musculoskeletal: No lower extremity edema. 2/4 pulses in all 4 extremities. . Skin: No ulcerative lesions noted or rashes, . Psychiatry: Mood is appropriate for condition and setting    Data Reviewed: CBC: Recent Labs  Lab 05/25/18 0843 05/26/18 0511 05/27/18 0332 05/30/18 1527 05/31/18 0821  WBC 1.6* 1.9* 1.9* 1.6* 1.0*  NEUTROABS 1.1* 1.0* 1.0* 0.7*  --   HGB 8.9* 7.8* 8.4* 4.6* 6.5*  HCT 28.4* 25.0* 26.3* 14.6* 19.9*  MCV 87.7 89.9 88.3 89.0 88.8  PLT 54* 35* 58* 26* 22*   Basic Metabolic Panel: Recent Labs  Lab 05/25/18 0843 05/26/18 0511 05/27/18 0332 05/30/18 1639 05/31/18 0821  NA 142 142 141 143 144  K 3.2* 3.5 3.7 4.2 3.6  CL 108 108 107 108 113*  CO2 25 25 24 25 22   GLUCOSE 169* 117* 123* 112* 94  BUN 23 23 23  48* 44*  CREATININE 1.17 1.20 1.09 1.15 1.06  CALCIUM 8.9 8.5* 8.4* 9.3 8.3*   GFR: Estimated Creatinine Clearance: 46.1 mL/min (by C-G formula based on SCr of 1.06 mg/dL). Liver Function Tests: No results for input(s): AST, ALT, ALKPHOS, BILITOT, PROT, ALBUMIN in the last 168 hours. No results for input(s): LIPASE, AMYLASE in the last 168 hours. No results for input(s): AMMONIA in the last 168 hours. Coagulation Profile: No results for input(s): INR, PROTIME in the last 168 hours. Cardiac Enzymes: No results for input(s): CKTOTAL, CKMB, CKMBINDEX, TROPONINI in the last 168 hours. BNP (last 3 results) No results for input(s): PROBNP in the last 8760 hours. HbA1C: No results for input(s): HGBA1C in the last 72 hours. CBG: No results for input(s): GLUCAP in the last 168 hours. Lipid Profile: No results for input(s): CHOL, HDL, LDLCALC, TRIG, CHOLHDL, LDLDIRECT in the  last 72 hours. Thyroid Function Tests: No results for input(s): TSH, T4TOTAL, FREET4, T3FREE, THYROIDAB in the last 72 hours. Anemia Panel: No results for input(s): VITAMINB12, FOLATE, FERRITIN, TIBC, IRON, RETICCTPCT in the last 72 hours. Urine analysis:    Component Value Date/Time   COLORURINE YELLOW 05/17/2018 0757   APPEARANCEUR HAZY (A) 05/17/2018 0757   LABSPEC 1.013 05/17/2018 0757   PHURINE 6.0 05/17/2018 0757   GLUCOSEU NEGATIVE 05/17/2018 0757   HGBUR NEGATIVE 05/17/2018 0757   BILIRUBINUR NEGATIVE 05/17/2018 0757   BILIRUBINUR negative 07/05/2015 1801   KETONESUR NEGATIVE 05/17/2018 0757   PROTEINUR NEGATIVE 05/17/2018 0757   UROBILINOGEN 0.2 07/05/2015 1801   NITRITE NEGATIVE 05/17/2018 0757   LEUKOCYTESUR LARGE (A) 05/17/2018 0757   Sepsis Labs: @LABRCNTIP (procalcitonin:4,lacticidven:4)  ) Recent Results (from the past 240 hour(s))  Culture, blood (Routine X 2) w Reflex to ID Panel     Status: None   Collection Time: 05/25/18  8:43 AM  Result Value Ref Range Status  Specimen Description   Final    BLOOD RIGHT ANTECUBITAL Performed at [facility] [address] [place], [place] [address]    Special Requests   Final    BOTTLES DRAWN AEROBIC AND ANAEROBIC Blood Culture adequate volume Performed at [facility] [address], [place], [place] [address]    Culture   Final    NO GROWTH 5 DAYS Performed at [facility], [place] [address], [place], [place] [address]    Report Status 05/30/2018 FINAL  Final  Culture, blood (Routine X 2) w Reflex to ID Panel     Status: None   Collection Time: 05/25/18  8:47 AM  Result Value Ref Range Status   Specimen Description   Final    BLOOD LEFT HAND Performed at [facility] [address] [place], [place] [address]    Special Requests   Final    BOTTLES DRAWN AEROBIC AND ANAEROBIC Blood Culture adequate volume Performed at [facility] [address] [place], [place] [address]    Culture   Final    NO GROWTH 5 DAYS Performed at [facility], [place] [address], [place], [place] [address]    Report Status 05/30/2018 FINAL  Final      Studies: No results found.  Scheduled Meds: . [START ON 06/03/2018] pantoprazole  40 mg Intravenous Q12H    Continuous Infusions: . sodium chloride    . sodium chloride 75 mL/hr at 05/31/18 0146  . pantoprozole (PROTONIX) infusion 8 mg/hr (05/30/18 2114)     LOS: 1 day     Cristal Deer, MD [facility]  To reach me or the doctor on call, go to: [email or web address] Password [email or web address]  05/31/2018, 10:40 AM

## 2018-05-31 NOTE — Progress Notes (Signed)
Lab called RN regarding CBC results. RN updated MD via phone regarding lab results.

## 2018-05-31 NOTE — Consult Note (Signed)
[facility] Gastroenterology Consult  Referring Provider: No ref. provider found Primary Care Physician:  [name], MD Primary Gastroenterologist: Althia Forts  Reason for Consultation:  melena  HPI: Joel [name] is a 78 y.o. 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  Patient was diagnosed with lymphoma in 12/18 and had required radiation therapy until 1/19. He endorses fatigue, unintentional weight loss. Denies nausea, vomiting, abdominal pain, difficulty swallowing or pain on swallowing. He was supposed to get a colonoscopy during last admission however developed fever on multiple days and the procedure was canceled. The patient does not remember having a colonoscopy prior.   Past Medical History:  Diagnosis Date  . Anemia   . Aneurysm ([place])    in 1987, brain aneurysm  . Bilateral hydronephrosis   . History of radiation therapy 04/17/17- 05/07/17   Right neck treated to 32.5 Gy with 13 fx of 2.5 Gy  . mantle cell lymphoma dx'd 03/2017  . Mass of right side of neck   . Pneumonia    hx of   . Prostate CA ([place]) dx'd 2017  . Urinary retention     Past Surgical History:  Procedure Laterality Date  . BIOPSY  05/21/2018   Procedure: BIOPSY;  Surgeon: Milus Banister, MD;  Location: [facility];  Service: Gastroenterology;;  . ESOPHAGOGASTRODUODENOSCOPY (EGD) WITH PROPOFOL N/A 05/21/2018   Procedure: ESOPHAGOGASTRODUODENOSCOPY (EGD) WITH PROPOFOL;  Surgeon: Milus Banister, MD;  Location: [facility];  Service: Gastroenterology;  Laterality: N/A;  . HOT HEMOSTASIS N/A 05/21/2018   Procedure: HOT  HEMOSTASIS (ARGON PLASMA COAGULATION/BICAP);  Surgeon: Milus Banister, MD;  Location: [facility];  Service: Gastroenterology;  Laterality: N/A;  . MASS BIOPSY Right 03/27/2017   Procedure: OPEN NECK MASS BIOPSY OF THE RIGHT NECK;  Surgeon: Leta Baptist, MD;  Location: [facility];  Service: ENT;  Laterality: Right;  . PROSTATE BIOPSY N/A 07/30/2015   Procedure: PROSTATE BIOPSY;  Surgeon: Irine Seal, MD;  Location: [facility];  Service: Urology;  Laterality: N/A;  . TRANSURETHRAL RESECTION OF PROSTATE N/A 07/30/2015   Procedure: TRANSURETHRAL RESECTION OF THE PROSTATE (TURP);  Surgeon: Irine Seal, MD;  Location: [facility];  Service: Urology;  Laterality: N/A;    Prior to Admission medications   Medication Sig Start Date End Date Taking? Authorizing Provider  amoxicillin-clavulanate (AUGMENTIN) 875-125 MG tablet Take 1 tablet by mouth every 12 (twelve) hours for 5 days. 05/27/18 06/01/18 Yes Dahal, Marlowe Aschoff, MD  feeding supplement, ENSURE ENLIVE, (ENSURE ENLIVE) LIQD Take 237 mLs by mouth 2 (two) times daily between meals. 03/22/18  Yes Elodia Florence., MD  pantoprazole (PROTONIX) 40 MG tablet Take 1 tablet (40 mg total) by mouth 2 (two) times daily for 30 days. 05/27/18 06/26/18 Yes Dahal, Marlowe Aschoff, MD  megestrol (MEGACE) 400 MG/10ML suspension Take 10 mLs (400 mg total) by mouth 2 (two) times daily. Patient not taking: Reported on 05/30/2018 03/10/18   Wyatt Portela, MD  senna-docusate (SENOKOT-S) 8.6-50 MG tablet Take 1 tablet by mouth 2 (two) times daily for 30 days. Patient not taking: Reported on 05/30/2018 05/27/18 06/26/18  Terrilee Croak, MD    Current Facility-Administered Medications  Medication Dose Route Frequency Provider  Last Rate Last Dose  . 0.9 %  sodium chloride infusion  10 mL/hr Intravenous Once Rise Patience, MD      . 0.9 %  sodium chloride infusion   Intravenous Continuous Rise Patience, MD 75 mL/hr at 05/31/18 0146    . acetaminophen (TYLENOL) tablet 650 mg  650 mg Oral Q6H PRN  Rise Patience, MD       Or  . acetaminophen (TYLENOL) suppository 650 mg  650 mg Rectal Q6H PRN Rise Patience, MD      . ondansetron [facility]) tablet 4 mg  4 mg Oral Q6H PRN Rise Patience, MD       Or  . ondansetron [facility]) injection 4 mg  4 mg Intravenous Q6H PRN Rise Patience, MD      . pantoprazole (PROTONIX) 80 mg in sodium chloride 0.9 % 250 mL (0.32 mg/mL) infusion  8 mg/hr Intravenous Continuous Rise Patience, MD 25 mL/hr at 05/30/18 2114 8 mg/hr at 05/30/18 2114  . [START ON 06/03/2018] pantoprazole (PROTONIX) injection 40 mg  40 mg Intravenous Q12H Rise Patience, MD   40 mg at 05/30/18 2109    Allergies as of 05/30/2018  . (No Known Allergies)    Family History  Problem Relation Age of Onset  . Dementia Mother   . Heart disease Father   . Lung cancer Brother   . Lupus Sister     Social History   Socioeconomic History  . Marital status: Married    Spouse name: Not on file  . Number of children: Not on file  . Years of education: Not on file  . Highest education level: Not on file  Occupational History  . Not on file  Social Needs  . Financial resource strain: Not on file  . Food insecurity:    Worry: Not on file    Inability: Not on file  . Transportation needs:    Medical: Not on file    Non-medical: Not on file  Tobacco Use  . Smoking status: Former Smoker    Last attempt to quit: 04/28/1985    Years since quitting: 33.1  . Smokeless tobacco: Former Systems developer    Types: Chew  . Tobacco comment: quit 30-40 yrs. aog  Substance and Sexual Activity  . Alcohol use: No    Alcohol/week: 0.0 standard drinks  . Drug use: No  . Sexual activity: Not on file  Lifestyle  . Physical activity:    Days per week: Not on file    Minutes per session: Not on file  . Stress: Not on file  Relationships  . Social connections:    Talks on phone: Not on file    Gets together: Not on file    Attends religious service: Not on file     Active member of club or organization: Not on file    Attends meetings of clubs or organizations: Not on file    Relationship status: Not on file  . Intimate partner violence:    Fear of current or ex partner: Not on file    Emotionally abused: Not on file    Physically abused: Not on file    Forced sexual activity: Not on file  Other Topics Concern  . Not on file  Social History Narrative  . Not on file    Review of Systems: Positive for: GI: Described in detail in HPI.    Gen: anorexia, fatigue, weakness, malaise, involuntary weight loss,Denies any fever,  chills, rigors, night sweats,  and sleep disorder CV: Denies chest pain, angina, palpitations, syncope, orthopnea, PND, peripheral edema, and claudication. Resp: Denies dyspnea, cough, sputum, wheezing, coughing up blood. GU : Denies urinary burning, blood in urine, urinary frequency, urinary hesitancy, nocturnal urination, and urinary incontinence. MS: Denies joint pain or swelling.  Denies muscle weakness, cramps, atrophy.  Derm: Denies rash, itching, oral ulcerations, hives, unhealing ulcers.  Psych: Denies depression, anxiety, memory loss, suicidal ideation, hallucinations,  and confusion. Heme: Denies bruising and enlarged lymph nodes. Neuro:  Denies any headaches, dizziness, paresthesias. Endo:  Denies any problems with DM, thyroid, adrenal function.  Physical Exam: Vital signs in last 24 hours: Temp:  [97.7 F (36.5 C)-98.7 F (37.1 C)] 98.2 F (36.8 C) (02/08 0621) Pulse Rate:  [72-97] 79 (02/08 1053) Resp:  [14-22] 22 (02/08 1053) BP: (94-133)/(59-83) 133/77 (02/08 1053) SpO2:  [99 %-100 %] 100 % (02/08 1053) Weight:  [50 kg-54.1 kg] 54.1 kg (02/07 2200)    General: Frail, thinly built Head:  Normocephalic and atraumatic. Eyes: Prominent pallor Ears:  Normal auditory acuity. Nose:  No deformity, discharge,  or lesions. Mouth:  No deformity or lesions.  Oropharynx pink & moist. Neck:  Supple; no masses or  thyromegaly. Lungs:  Clear throughout to auscultation.   No wheezes, crackles, or rhonchi. No acute distress. Heart:  Regular rate and rhythm; no murmurs, clicks, rubs,  or gallops. Extremities:   Clubbing, Without edema. Neurologic:  Alert and  oriented x4;  grossly normal neurologically. Skin:  Intact without significant lesions or rashes. Psych:  Alert and cooperative. Normal mood and affect. Abdomen:  Soft, nontender and nondistended. No masses, hepatosplenomegaly or hernias noted. Normal bowel sounds, without guarding, and without rebound.         Lab Results: Recent Labs    05/30/18 1527 05/31/18 0821  WBC 1.6* 1.0*  HGB 4.6* 6.5*  HCT 14.6* 19.9*  PLT 26* 22*   BMET Recent Labs    05/30/18 1639 05/31/18 0821  NA 143 144  K 4.2 3.6  CL 108 113*  CO2 25 22  GLUCOSE 112* 94  BUN 48* 44*  CREATININE 1.15 1.06  CALCIUM 9.3 8.3*   LFT No results for input(s): PROT, ALBUMIN, AST, ALT, ALKPHOS, BILITOT, BILIDIR, IBILI in the last 72 hours. PT/INR No results for input(s): LABPROT, INR in the last 72 hours.  Studies/Results: No results found.  Impression: Melena and anemia in the setting of pancytopenia, elevated BUN/creatinine ratio suspicious for upper GI bleed could be gastric AVM versus small bowel bleed. Continued thrombocytopenia, platelet count only 22  Plan: Supportive management. I will not be able to proceed with an endoscopy/small bowel PillCam or a colonoscopy until the platelet count improves to at least above 50,000 as there is increased risk of bleeding from the procedure/instrumentation itself.  Bleeding could be related to spontaneous mucosal bleeding due to severe thrombocytopenia, versus bleeding from AVMs. Start patient on clear liquid diet Transfuse to keep platelet count above 50,000 and hemoglobin to at least above 7. WBC count only 1, may need neutropenic precautions.    LOS: 1 day   Area Therisa Doyne, MD   05/31/2018, 11:32 AM  Pager  [phone] If no answer or after 5 PM call [phone]

## 2018-05-31 NOTE — Progress Notes (Signed)
Patient had 2 units of PRBC and 1 unit of Platelets. Post Blood Transfusion labs were ordered for 2 hours post transfusion

## 2018-06-01 LAB — CBC WITH DIFFERENTIAL/PLATELET
Abs Immature Granulocytes: 0.09 10*3/uL — ABNORMAL HIGH (ref 0.00–0.07)
BASOS ABS: 0 10*3/uL (ref 0.0–0.1)
Basophils Relative: 1 %
Eosinophils Absolute: 0 10*3/uL (ref 0.0–0.5)
Eosinophils Relative: 0 %
HEMATOCRIT: 22.6 % — AB (ref 39.0–52.0)
Hemoglobin: 7.3 g/dL — ABNORMAL LOW (ref 13.0–17.0)
Immature Granulocytes: 8 %
Lymphocytes Relative: 40 %
Lymphs Abs: 0.5 10*3/uL — ABNORMAL LOW (ref 0.7–4.0)
MCH: 28.9 pg (ref 26.0–34.0)
MCHC: 32.3 g/dL (ref 30.0–36.0)
MCV: 89.3 fL (ref 80.0–100.0)
Monocytes Absolute: 0.1 10*3/uL (ref 0.1–1.0)
Monocytes Relative: 10 %
Neutro Abs: 0.5 10*3/uL — ABNORMAL LOW (ref 1.7–7.7)
Neutrophils Relative %: 41 %
Platelets: 20 10*3/uL — CL (ref 150–400)
RBC: 2.53 MIL/uL — ABNORMAL LOW (ref 4.22–5.81)
RDW: 14.4 % (ref 11.5–15.5)
WBC: 1.1 10*3/uL — CL (ref 4.0–10.5)
nRBC: 6.2 % — ABNORMAL HIGH (ref 0.0–0.2)

## 2018-06-01 LAB — PREPARE PLATELET PHERESIS: Unit division: 0

## 2018-06-01 LAB — BPAM PLATELET PHERESIS
Blood Product Expiration Date: 202002082359
ISSUE DATE / TIME: 202002080021
UNIT TYPE AND RH: 6200

## 2018-06-01 LAB — COMPREHENSIVE METABOLIC PANEL
ALT: 15 U/L (ref 0–44)
AST: 20 U/L (ref 15–41)
Albumin: 2 g/dL — ABNORMAL LOW (ref 3.5–5.0)
Alkaline Phosphatase: 59 U/L (ref 38–126)
Anion gap: 7 (ref 5–15)
BILIRUBIN TOTAL: 0.6 mg/dL (ref 0.3–1.2)
BUN: 28 mg/dL — ABNORMAL HIGH (ref 8–23)
CO2: 20 mmol/L — ABNORMAL LOW (ref 22–32)
Calcium: 8 mg/dL — ABNORMAL LOW (ref 8.9–10.3)
Chloride: 116 mmol/L — ABNORMAL HIGH (ref 98–111)
Creatinine, Ser: 1 mg/dL (ref 0.61–1.24)
GFR calc Af Amer: >60 mL/min (ref >60)
GFR calc non Af Amer: >60 mL/min (ref >60)
Glucose, Bld: 106 mg/dL — ABNORMAL HIGH (ref 70–99)
Potassium: 3.7 mmol/L (ref 3.5–5.1)
Sodium: 143 mmol/L (ref 135–145)
Total Protein: 4.4 g/dL — ABNORMAL LOW (ref 6.5–8.1)

## 2018-06-01 MED ORDER — SODIUM CHLORIDE 0.9% IV SOLUTION: Freq: Once | INTRAVENOUS | Status: DC

## 2018-06-01 MED ORDER — ENSURE ENLIVE PO LIQD
237.0000 mL | Freq: Two times a day (BID) | ORAL | Status: DC
  Administered 2018-06-01 – 2018-06-05 (×9): 237 mL via ORAL

## 2018-06-01 NOTE — Progress Notes (Addendum)
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  GI was consulted they are not going to be able to do EGD until his platelet count is improved  SUBJECTIVE,06/01/2018: Patient seen at bedside with her his daughter in the room.  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        GI  Bleed: ed doc Discussed with unassigned gastroenterology on-call, Dr. Therisa Doyne, accepts patient as consultant.  She will see tomorrow.  She states he cannot have endoscopy until his platelets are improved.  She recommends IV Protonix drip 2.  Urinary tract infection wife stated that patient was on amoxicillin at home and was supposed to and tomorrow for 01/11/2019 and he has not had any since he came in.  So I will start him on amoxicillin Augmentin 1 twice a day for today and tomorrow 3.  Pancytopenia with thrombocytopenia patient received platelet transfusion 4.  AVM causing his GI bleed as well continue to monitor he is on n.p.o. except clear liquids.  Patient is transfusion dependent.  He received a total of 3 units yesterday of packed RBC. I will order platelet transfusion today as his count went down from 22 to 20 today.  Also attempt to consult hematology  5.  Mantle cell carcinoma and prostate  cancer he is being followed by oncologist Dr. Alen Blew prognosis is poor per oncologist note of May 26, 2018 oncologist note 6.  Neutropenia white count is 1.0 his has been put on neutropenic precaution  7. thrombocytopenia I will order platelet transfusion today as his count went down from 22-20 today.  Also attempt to consult hematology.I consulted with Dr. Marin Olp the oncologist is in agreement with platelet transfusion and Dr. Alen Blew will see him tomorrow       Code Status: Full  Severity of Illness: The appropriate patient status for this patient is INPATIENT. Inpatient status is judged to be reasonable and necessary in order to provide the required intensity of service to ensure the patient's safety. The patient's presenting symptoms, physical exam findings, and initial radiographic and laboratory data in the context of their chronic comorbidities is felt to place them at high risk for further clinical deterioration. Furthermore, it is not anticipated that the patient will be medically stable for discharge from the hospital within 2 midnights of admission. The following factors support the patient status of inpatient.   " The patient's presenting symptoms include GI bleed. " The worrisome physical exam findings include GI bleed. " The initial radiographic and laboratory data are worrisome because of severe anemia of 4.6 requiring blood transfusion. " The chronic co-morbidities include cancer.   * I certify that at the point of admission it is my clinical judgment that the patient will require inpatient hospital  care spanning beyond 2 midnights from the point of admission due to high intensity of service, high risk for further deterioration and high frequency of surveillance required.*    Family Communication: Daughter Chantee at bedside  Disposition Plan: Home when stable   Consultants: gastroenterology on-call, Dr. Therisa Doyne Dr. Marin Olp oncology  Procedures:  Blood  transfusion  Antimicrobials:  Augmentin  DVT prophylaxis: SCD   Objective: Vitals:   06/01/18 0254 06/01/18 0603 06/01/18 1022 06/01/18 1425  BP: 132/88 130/66 120/81 101/71  Pulse: 86 65 86 99  Resp: 16 16  16   Temp: 98.1 F (36.7 C) 98.6 F (37 C) 98 F (36.7 C) 98.5 F (36.9 C)  TempSrc: Oral Oral Oral Oral  SpO2: 100% 99% 99% 100%  Weight:      Height:        Intake/Output Summary (Last 24 hours) at 06/01/2018 1425 Last data filed at 06/01/2018 1000 Gross per 24 hour  Intake 1992 ml  Output -  Net 1992 ml   Filed Weights   05/30/18 1629 05/30/18 2200  Weight: 50 kg 54.1 kg   Body mass index is 18.67 kg/m.  Exam:  . General: 78 y.o. year-old male well developed malnourished cachectic, in no acute distress.  Alert and oriented x3. . Cardiovascular: Regular rate and rhythm with no rubs or gallops.  No thyromegaly or JVD noted.   Marland Kitchen Respiratory: Clear to auscultation with no wheezes or rales. Good inspiratory effort. . Abdomen: Soft nontender nondistended with normal bowel sounds x4 quadrants. . Musculoskeletal: No lower extremity edema. 2/4 pulses in all 4 extremities. . Skin: No ulcerative lesions noted or rashes, . Psychiatry: Mood is appropriate for condition and setting    Data Reviewed: CBC: Recent Labs  Lab 05/26/18 0511 05/27/18 0332 05/30/18 1527 05/31/18 0821 06/01/18 0549  WBC 1.9* 1.9* 1.6* 1.0* 1.1*  NEUTROABS 1.0* 1.0* 0.7*  --  0.5*  HGB 7.8* 8.4* 4.6* 6.5* 7.3*  HCT 25.0* 26.3* 14.6* 19.9* 22.6*  MCV 89.9 88.3 89.0 88.8 89.3  PLT 35* 58* 26* 22* 20*   Basic Metabolic Panel: Recent Labs  Lab 05/26/18 0511 05/27/18 0332 05/30/18 1639 05/31/18 0821 06/01/18 0549  NA 142 141 143 144 143  K 3.5 3.7 4.2 3.6 3.7  CL 108 107 108 113* 116*  CO2 25 24 25 22  20*  GLUCOSE 117* 123* 112* 94 106*  BUN 23 23 48* 44* 28*  CREATININE 1.20 1.09 1.15 1.06 1.00  CALCIUM 8.5* 8.4* 9.3 8.3* 8.0*   GFR: Estimated Creatinine Clearance: 48.8  mL/min (by C-G formula based on SCr of 1 mg/dL). Liver Function Tests: Recent Labs  Lab 05/31/18 0816 06/01/18 0549  AST 29 20  ALT 18 15  ALKPHOS 66 59  BILITOT 0.6 0.6  PROT 4.8* 4.4*  ALBUMIN 2.1* 2.0*   No results for input(s): LIPASE, AMYLASE in the last 168 hours. No results for input(s): AMMONIA in the last 168 hours. Coagulation Profile: No results for input(s): INR, PROTIME in the last 168 hours. Cardiac Enzymes: No results for input(s): CKTOTAL, CKMB, CKMBINDEX, TROPONINI in the last 168 hours. BNP (last 3 results) No results for input(s): PROBNP in the last 8760 hours. HbA1C: No results for input(s): HGBA1C in the last 72 hours. CBG: No results for input(s): GLUCAP in the last 168 hours. Lipid Profile: No results for input(s): CHOL, HDL, LDLCALC, TRIG, CHOLHDL, LDLDIRECT in the last 72 hours. Thyroid Function Tests: No results for input(s): TSH, T4TOTAL, FREET4, T3FREE, THYROIDAB in the  last 72 hours. Anemia Panel: No results for input(s): VITAMINB12, FOLATE, FERRITIN, TIBC, IRON, RETICCTPCT in the last 72 hours. Urine analysis:    Component Value Date/Time   COLORURINE YELLOW 05/17/2018 0757   APPEARANCEUR HAZY (A) 05/17/2018 0757   LABSPEC 1.013 05/17/2018 0757   PHURINE 6.0 05/17/2018 0757   GLUCOSEU NEGATIVE 05/17/2018 0757   HGBUR NEGATIVE 05/17/2018 0757   BILIRUBINUR NEGATIVE 05/17/2018 0757   BILIRUBINUR negative 07/05/2015 1801   KETONESUR NEGATIVE 05/17/2018 0757   PROTEINUR NEGATIVE 05/17/2018 0757   UROBILINOGEN 0.2 07/05/2015 1801   NITRITE NEGATIVE 05/17/2018 0757   LEUKOCYTESUR LARGE (A) 05/17/2018 0757   Sepsis Labs: @LABRCNTIP (procalcitonin:4,lacticidven:4)  ) Recent Results (from the past 240 hour(s))  Culture, blood (Routine X 2) w Reflex to ID Panel     Status: None   Collection Time: 05/25/18  8:43 AM  Result Value Ref Range Status   Specimen Description   Final    BLOOD RIGHT ANTECUBITAL Performed at [facility], [place] [address], [place], [place] [address]    Special Requests   Final    BOTTLES DRAWN AEROBIC AND ANAEROBIC Blood Culture adequate volume Performed at [place] [address], [place], [place] [address]    Culture   Final    NO GROWTH 5 DAYS Performed at [facility], [place] [address] [place], [place] [address]    Report Status 05/30/2018 FINAL  Final  Culture, blood (Routine X 2) w Reflex to ID Panel     Status: None   Collection Time: 05/25/18  8:47 AM  Result Value Ref Range Status   Specimen Description   Final    BLOOD LEFT HAND Performed at [place] [address], [place], [place] [address]    Special Requests   Final    BOTTLES DRAWN AEROBIC AND ANAEROBIC Blood Culture adequate volume Performed at [facility] [address], [place], [place] [address]    Culture   Final    NO GROWTH 5 DAYS Performed at [facility], [place] [address], [place], [place] [address]    Report Status 05/30/2018 FINAL  Final      Studies: No results found.  Scheduled Meds: . sodium chloride   Intravenous Once  . feeding supplement (ENSURE ENLIVE)  237 mL Oral BID BM  . [START ON 06/03/2018] pantoprazole  40 mg Intravenous Q12H    Continuous Infusions: . sodium chloride    . sodium chloride 75 mL/hr at 06/01/18 0533  . pantoprozole (PROTONIX) infusion 8 mg/hr (06/01/18 0314)     LOS: 2 days     Cristal Deer, MD [facility]  To reach me or the doctor on call, go to: [email or web address] Password [email or web address]  06/01/2018, 2:25 PM

## 2018-06-01 NOTE — Progress Notes (Signed)
Initial Nutrition Assessment  DOCUMENTATION CODES:   Severe malnutrition in context of chronic illness  INTERVENTION:   Provide Ensure Enlive po BID, each supplement provides 350 kcal and 20 grams of protein  NUTRITION DIAGNOSIS:   Severe Malnutrition related to chronic illness, cancer and cancer related treatments as evidenced by percent weight loss, energy intake < or equal to 75% for > or equal to 1 month, moderate fat depletion, severe muscle depletion.  GOAL:   Patient will meet greater than or equal to 90% of their needs  MONITOR:   PO intake, Supplement acceptance, Labs, Weight trends, I & O's  REASON FOR ASSESSMENT:   Malnutrition Screening Tool   ASSESSMENT:   78 y.o. male with history of mantle cell lymphoma status post chemo and radiation and history of castration sensitive prostate cancer who was just recently discharged home after being admitted for acute GI bleeding .  Patient familiar to RD from previous admission (1/24-2/4). Pt was eating adequately initially during that admission then intakes decreased to 30-40%.   Pt was on clear liquids yesterday. Now on full liquids. Per GI, awaiting improvements in platelet count before endoscopic procedures. Will order Ensure supplements for additional kcal and protein on liquid diet.  Per weight records, pt weighed 153 lb in September 2019. Pt has lost 34 lb since Sept (22% wt loss x 5 months, significant for time frame).   Labs reviewed. Medications reviewed.  NUTRITION - FOCUSED PHYSICAL EXAM:    Most Recent Value  Orbital Region  Mild depletion  Upper Arm Region  Severe depletion  Thoracic and Lumbar Region  Unable to assess  Buccal Region  Moderate depletion  Temple Region  Severe depletion  Clavicle Bone Region  Severe depletion  Clavicle and Acromion Bone Region  Severe depletion  Scapular Bone Region  Unable to assess  Dorsal Hand  Moderate depletion  Patellar Region  Unable to assess  Anterior Thigh  Region  Unable to assess  Posterior Calf Region  Unable to assess  Edema (RD Assessment)  None       Diet Order:   Diet Order            Diet full liquid Room service appropriate? Yes; Fluid consistency: Thin  Diet effective now              EDUCATION NEEDS:   No education needs have been identified at this time  Skin:  Skin Assessment: Reviewed RN Assessment  Last BM:  2/8  Height:   Ht Readings from Last 1 Encounters:  05/30/18 5\' 7"  (1.702 m)    Weight:   Wt Readings from Last 1 Encounters:  05/30/18 54.1 kg    Ideal Body Weight:  67.2 kg  BMI:  Body mass index is 18.67 kg/m.  Estimated Nutritional Needs:   Kcal:  1900-2100  Protein:  95-105g  Fluid:  2L/day   Clayton Bibles, MS, RD, LDN [place] Dietitian Pager: [phone] After Hours Pager: [phone]

## 2018-06-01 NOTE — Progress Notes (Signed)
Subjective: The patient was seen and examined at bedside. Reports that stools are loose and brown in color now.  Denies abdominal pain.  Objective: Vital signs in last 24 hours: Temp:  [98.1 F (36.7 C)-99.2 F (37.3 C)] 98.6 F (37 C) (02/09 0603) Pulse Rate:  [65-95] 65 (02/09 0603) Resp:  [16-22] 16 (02/09 0603) BP: (110-133)/(66-88) 130/66 (02/09 0603) SpO2:  [98 %-100 %] 99 % (02/09 0603) Weight change:  Last BM Date: 05/31/18  PE: Frail, thinly built, chronically ill-appearing, hair loss GENERAL: Prominent pallor, no icterus ABDOMEN: Soft, nondistended EXTREMITIES: No deformity  Lab Results: Results for orders placed or performed during the hospital encounter of 05/30/18 (from the past 48 hour(s))  Type and screen [place]     Status: None   Collection Time: 05/30/18  3:28 PM  Result Value Ref Range   ABO/RH(D) O POS    Antibody Screen NEG    Sample Expiration 06/02/2018    Unit Number [id number]    Blood Component Type RED CELLS,LR    Unit division 00    Status of Unit ISSUED,FINAL    Transfusion Status OK TO TRANSFUSE    Crossmatch Result Compatible    Unit Number [id number]    Blood Component Type RED CELLS,LR    Unit division 00    Status of Unit ISSUED,FINAL    Transfusion Status OK TO TRANSFUSE    Crossmatch Result      Compatible Performed at [facility], [place] [address] [place], [place] [address]    Unit Number [id number]    Blood Component Type RED CELLS,LR    Unit division 00    Status of Unit ISSUED,FINAL    Transfusion Status OK TO TRANSFUSE    Crossmatch Result Compatible   Basic metabolic panel     Status: Abnormal   Collection Time: 05/30/18  4:39 PM  Result Value Ref Range   Sodium 143 135 - 145 mmol/L   Potassium 4.2 3.5 - 5.1 mmol/L   Chloride 108 98 - 111 mmol/L   CO2 25 22 - 32 mmol/L   Glucose, Bld 112 (H) 70 - 99 mg/dL   BUN 48 (H) 8 - 23 mg/dL   Creatinine, Ser 1.15 0.61 - 1.24  mg/dL   Calcium 9.3 8.9 - 10.3 mg/dL   GFR calc non Af Amer >60 >60 mL/min   GFR calc Af Amer >60 >60 mL/min   Anion gap 10 5 - 15    Comment: Performed at [facility], [place] [address], [place], [place] [address]  Prepare RBC     Status: None   Collection Time: 05/30/18  4:41 PM  Result Value Ref Range   Order Confirmation      ORDER PROCESSED BY BLOOD BANK Performed at [facility] [place] [address] [place], [place] [address]   Prepare Pheresed Platelets     Status: None   Collection Time: 05/30/18 10:41 PM  Result Value Ref Range   Unit Number [id number]    Blood Component Type PLTP LR3 PAS    Unit division 00    Status of Unit ISSUED,FINAL    Transfusion Status      OK TO TRANSFUSE Performed at [place] [address] [place],  [address]   Hepatic function panel     Status: Abnormal   Collection Time: 05/31/18  8:16 AM  Result Value Ref Range   Total Protein 4.8 (L) 6.5 - 8.1  g/dL   Albumin 2.1 (L) 3.5 - 5.0 g/dL   AST 29 15 - 41 U/L   ALT 18 0 - 44 U/L   Alkaline Phosphatase 66 38 - 126 U/L   Total Bilirubin 0.6 0.3 - 1.2 mg/dL   Bilirubin, Direct 0.1 0.0 - 0.2 mg/dL   Indirect Bilirubin 0.5 0.3 - 0.9 mg/dL    Comment: Performed at [facility], [place] [address], [place], [place] [address]  CBC     Status: Abnormal   Collection Time: 05/31/18  8:21 AM  Result Value Ref Range   WBC 1.0 (LL) 4.0 - 10.5 K/uL    Comment: RESULT REPEATED AND VERIFIED CRITICAL RESULT CALLED TO, READ BACK BY AND VERIFIED WITH: HUFF,J AT 1030 ON 020820 BY HOOKER,B    RBC 2.24 (L) 4.22 - 5.81 MIL/uL   Hemoglobin 6.5 (LL) 13.0 - 17.0 g/dL    Comment: RESULT REPEATED AND VERIFIED CRITICAL RESULT CALLED TO, READ BACK BY AND VERIFIED WITH: HUFF,J AT 1030 ON 020820 BY HOOKER,B    HCT 19.9 (L) 39.0 - 52.0 %   MCV 88.8 80.0 - 100.0 fL   MCH 29.0 26.0 - 34.0 pg   MCHC 32.7 30.0 - 36.0 g/dL   RDW 13.9 11.5  - 15.5 %   Platelets 22 (LL) 150 - 400 K/uL    Comment: RESULT REPEATED AND VERIFIED CONSISTENT WITH PREVIOUS RESULT CRITICAL RESULT CALLED TO, READ BACK BY AND VERIFIED WITH: HUFF,J AT 1030 ON 020820 BY HOOKER,B    nRBC 0.0 0.0 - 0.2 %    Comment: Performed at [facility], [place] [address], [place], [place] [address]  Basic metabolic panel     Status: Abnormal   Collection Time: 05/31/18  8:21 AM  Result Value Ref Range   Sodium 144 135 - 145 mmol/L   Potassium 3.6 3.5 - 5.1 mmol/L   Chloride 113 (H) 98 - 111 mmol/L   CO2 22 22 - 32 mmol/L   Glucose, Bld 94 70 - 99 mg/dL   BUN 44 (H) 8 - 23 mg/dL   Creatinine, Ser 1.06 0.61 - 1.24 mg/dL   Calcium 8.3 (L) 8.9 - 10.3 mg/dL   GFR calc non Af Amer >60 >60 mL/min   GFR calc Af Amer >60 >60 mL/min   Anion gap 9 5 - 15    Comment: Performed at [facility], [place] [address], [place], [place] [address]  Prepare RBC     Status: None   Collection Time: 05/31/18  6:32 PM  Result Value Ref Range   Order Confirmation      ORDER PROCESSED BY BLOOD BANK Performed at [facility], [place] [address] [place], [place] [address]   CBC with Differential/Platelet     Status: Abnormal   Collection Time: 06/01/18  5:49 AM  Result Value Ref Range   WBC 1.1 (LL) 4.0 - 10.5 K/uL    Comment: REPEATED TO VERIFY CRITICAL VALUE NOTED.  VALUE IS CONSISTENT WITH PREVIOUSLY REPORTED AND CALLED VALUE.    RBC 2.53 (L) 4.22 - 5.81 MIL/uL   Hemoglobin 7.3 (L) 13.0 - 17.0 g/dL   HCT 22.6 (L) 39.0 - 52.0 %   MCV 89.3 80.0 - 100.0 fL   MCH 28.9 26.0 - 34.0 pg   MCHC 32.3 30.0 - 36.0 g/dL   RDW 14.4 11.5 - 15.5 %   Platelets 20 (LL) 150 - 400 K/uL    Comment: REPEATED TO VERIFY Immature  Platelet Fraction may be clinically indicated, consider ordering this additional test [id number] CRITICAL VALUE NOTED.  VALUE IS CONSISTENT WITH PREVIOUSLY REPORTED AND CALLED VALUE.    nRBC 6.2 (H) 0.0 - 0.2 %    Neutrophils Relative % 41 %   Neutro Abs 0.5 (L) 1.7 - 7.7 K/uL   Lymphocytes Relative 40 %   Lymphs Abs 0.5 (L) 0.7 - 4.0 K/uL   Monocytes Relative 10 %   Monocytes Absolute 0.1 0.1 - 1.0 K/uL   Eosinophils Relative 0 %   Eosinophils Absolute 0.0 0.0 - 0.5 K/uL   Basophils Relative 1 %   Basophils Absolute 0.0 0.0 - 0.1 K/uL   WBC Morphology DOHLE BODIES     Comment: MILD LEFT SHIFT (1-5% METAS, OCC MYELO, OCC BANDS)   RBC Morphology RARE NUCLEATED RED BLOOD CELLS    Immature Granulocytes 8 %   Abs Immature Granulocytes 0.09 (H) 0.00 - 0.07 K/uL   Polychromasia PRESENT     Comment: Performed at [facility], [place] [address] [place], [place] [address]  Comprehensive metabolic panel     Status: Abnormal   Collection Time: 06/01/18  5:49 AM  Result Value Ref Range   Sodium 143 135 - 145 mmol/L   Potassium 3.7 3.5 - 5.1 mmol/L   Chloride 116 (H) 98 - 111 mmol/L   CO2 20 (L) 22 - 32 mmol/L   Glucose, Bld 106 (H) 70 - 99 mg/dL   BUN 28 (H) 8 - 23 mg/dL   Creatinine, Ser 1.00 0.61 - 1.24 mg/dL   Calcium 8.0 (L) 8.9 - 10.3 mg/dL   Total Protein 4.4 (L) 6.5 - 8.1 g/dL   Albumin 2.0 (L) 3.5 - 5.0 g/dL   AST 20 15 - 41 U/L   ALT 15 0 - 44 U/L   Alkaline Phosphatase 59 38 - 126 U/L   Total Bilirubin 0.6 0.3 - 1.2 mg/dL   GFR calc non Af Amer >60 >60 mL/min   GFR calc Af Amer >60 >60 mL/min   Anion gap 7 5 - 15    Comment: Performed at [facility], [place] [address], [place], [place] [address]    Studies/Results: No results found.  Medications: I have reviewed the patient's current medications.  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 Recommend hematology evaluation   Ronnette Juniper 06/01/2018, 9:58 AM   Pager [phone] If no answer or after 5 PM call [phone]

## 2018-06-02 ENCOUNTER — Inpatient Hospital Stay (HOSPITAL_COMMUNITY): Payer: Medicare HMO

## 2018-06-02 ENCOUNTER — Inpatient Hospital Stay: Payer: Medicare HMO

## 2018-06-02 ENCOUNTER — Inpatient Hospital Stay: Payer: Medicare HMO | Admitting: Oncology

## 2018-06-02 ENCOUNTER — Inpatient Hospital Stay: Payer: Medicare HMO | Admitting: Nutrition

## 2018-06-02 DIAGNOSIS — D61818 Other pancytopenia: Secondary | ICD-10-CM

## 2018-06-02 DIAGNOSIS — C8310 Mantle cell lymphoma, unspecified site: Secondary | ICD-10-CM

## 2018-06-02 DIAGNOSIS — C61 Malignant neoplasm of prostate: Secondary | ICD-10-CM

## 2018-06-02 DIAGNOSIS — K922 Gastrointestinal hemorrhage, unspecified: Secondary | ICD-10-CM

## 2018-06-02 LAB — CBC WITH DIFFERENTIAL/PLATELET
Abs Immature Granulocytes: 0.14 10*3/uL — ABNORMAL HIGH (ref 0.00–0.07)
Basophils Absolute: 0 10*3/uL (ref 0.0–0.1)
Basophils Relative: 0 %
Eosinophils Absolute: 0 10*3/uL (ref 0.0–0.5)
Eosinophils Relative: 1 %
HCT: 15.1 % — ABNORMAL LOW (ref 39.0–52.0)
HEMOGLOBIN: 4.9 g/dL — AB (ref 13.0–17.0)
Immature Granulocytes: 9 %
LYMPHS ABS: 0.6 10*3/uL — AB (ref 0.7–4.0)
Lymphocytes Relative: 42 %
MCH: 29 pg (ref 26.0–34.0)
MCHC: 32.5 g/dL (ref 30.0–36.0)
MCV: 89.3 fL (ref 80.0–100.0)
MONO ABS: 0.2 10*3/uL (ref 0.1–1.0)
Monocytes Relative: 16 %
Neutro Abs: 0.5 10*3/uL — ABNORMAL LOW (ref 1.7–7.7)
Neutrophils Relative %: 32 %
Platelets: 124 10*3/uL — ABNORMAL LOW (ref 150–400)
RBC: 1.69 MIL/uL — ABNORMAL LOW (ref 4.22–5.81)
RDW: 14.8 % (ref 11.5–15.5)
WBC: 1.5 10*3/uL — ABNORMAL LOW (ref 4.0–10.5)
nRBC: 4 % — ABNORMAL HIGH (ref 0.0–0.2)

## 2018-06-02 LAB — COMPREHENSIVE METABOLIC PANEL
ALK PHOS: 55 U/L (ref 38–126)
ALT: 14 U/L (ref 0–44)
AST: 18 U/L (ref 15–41)
Albumin: 2.2 g/dL — ABNORMAL LOW (ref 3.5–5.0)
Anion gap: 8 (ref 5–15)
BILIRUBIN TOTAL: 0.6 mg/dL (ref 0.3–1.2)
BUN: 28 mg/dL — ABNORMAL HIGH (ref 8–23)
CALCIUM: 8.1 mg/dL — AB (ref 8.9–10.3)
CO2: 19 mmol/L — ABNORMAL LOW (ref 22–32)
Chloride: 119 mmol/L — ABNORMAL HIGH (ref 98–111)
Creatinine, Ser: 1.07 mg/dL (ref 0.61–1.24)
GFR calc Af Amer: >60 mL/min (ref >60)
GFR calc non Af Amer: >60 mL/min (ref >60)
GLUCOSE: 105 mg/dL — AB (ref 70–99)
Potassium: 3.4 mmol/L — ABNORMAL LOW (ref 3.5–5.1)
Sodium: 146 mmol/L — ABNORMAL HIGH (ref 135–145)
Total Protein: 4.8 g/dL — ABNORMAL LOW (ref 6.5–8.1)

## 2018-06-02 LAB — CBC
HCT: 15.3 % — ABNORMAL LOW (ref 39.0–52.0)
Hemoglobin: 5.2 g/dL — CL (ref 13.0–17.0)
MCH: 28.7 pg (ref 26.0–34.0)
MCHC: 34 g/dL (ref 30.0–36.0)
MCV: 84.5 fL (ref 80.0–100.0)
Platelets: 124 10*3/uL — ABNORMAL LOW (ref 150–400)
RBC: 1.81 MIL/uL — ABNORMAL LOW (ref 4.22–5.81)
RDW: 14.5 % (ref 11.5–15.5)
WBC: 1.6 10*3/uL — ABNORMAL LOW (ref 4.0–10.5)
nRBC: 4.4 % — ABNORMAL HIGH (ref 0.0–0.2)

## 2018-06-02 LAB — PREPARE RBC (CROSSMATCH)

## 2018-06-02 MED ORDER — TECHNETIUM TC 99M-LABELED RED BLOOD CELLS IV KIT
23.0000 | PACK | Freq: Once | INTRAVENOUS | Status: AC | PRN
  Administered 2018-06-02: 23 via INTRAVENOUS

## 2018-06-02 MED ORDER — SODIUM CHLORIDE 0.9% IV SOLUTION
Freq: Once | INTRAVENOUS | Status: AC
  Administered 2018-06-02: 13:00:00 via INTRAVENOUS

## 2018-06-02 MED ORDER — PEG 3350-KCL-NA BICARB-NACL 420 G PO SOLR
4000.0000 mL | Freq: Once | ORAL | Status: AC
  Administered 2018-06-02: 4000 mL via ORAL

## 2018-06-02 NOTE — H&P (View-Only) (Signed)
Subjective: The patient was seen and examined at bedside.  He states he has had 2 bloody bowel movements overnight.  Objective: Vital signs in last 24 hours: Temp:  [97.5 F (36.4 C)-99 F (37.2 C)] 98 F (36.7 C) (02/10 0545) Pulse Rate:  [88-108] 91 (02/10 0545) Resp:  [16-20] 16 (02/10 0545) BP: (101-131)/(69-78) 131/74 (02/10 0545) SpO2:  [98 %-100 %] 100 % (02/10 0545) Weight change:  Last BM Date: 05/31/18  VW:[id number], frail, elderly GENERAL: Prominent pallor ABDOMEN: Soft, nontender EXTREMITIES: No deformity  Lab Results: Results for orders placed or performed during the hospital encounter of 05/30/18 (from the past 48 hour(s))  Prepare RBC     Status: None   Collection Time: 05/31/18  6:32 PM  Result Value Ref Range   Order Confirmation      ORDER PROCESSED BY BLOOD BANK Performed at [facility], [place] [address], [place], [place] [address]   CBC with Differential/Platelet     Status: Abnormal   Collection Time: 06/01/18  5:49 AM  Result Value Ref Range   WBC 1.1 (LL) 4.0 - 10.5 K/uL    Comment: REPEATED TO VERIFY CRITICAL VALUE NOTED.  VALUE IS CONSISTENT WITH PREVIOUSLY REPORTED AND CALLED VALUE.    RBC 2.53 (L) 4.22 - 5.81 MIL/uL   Hemoglobin 7.3 (L) 13.0 - 17.0 g/dL   HCT 22.6 (L) 39.0 - 52.0 %   MCV 89.3 80.0 - 100.0 fL   MCH 28.9 26.0 - 34.0 pg   MCHC 32.3 30.0 - 36.0 g/dL   RDW 14.4 11.5 - 15.5 %   Platelets 20 (LL) 150 - 400 K/uL    Comment: REPEATED TO VERIFY Immature Platelet Fraction may be clinically indicated, consider ordering this additional test [id number] CRITICAL VALUE NOTED.  VALUE IS CONSISTENT WITH PREVIOUSLY REPORTED AND CALLED VALUE.    nRBC 6.2 (H) 0.0 - 0.2 %   Neutrophils Relative % 41 %   Neutro Abs 0.5 (L) 1.7 - 7.7 K/uL   Lymphocytes Relative 40 %   Lymphs Abs 0.5 (L) 0.7 - 4.0 K/uL   Monocytes Relative 10 %   Monocytes Absolute 0.1 0.1 - 1.0 K/uL   Eosinophils Relative 0 %   Eosinophils Absolute 0.0  0.0 - 0.5 K/uL   Basophils Relative 1 %   Basophils Absolute 0.0 0.0 - 0.1 K/uL   WBC Morphology DOHLE BODIES     Comment: MILD LEFT SHIFT (1-5% METAS, OCC MYELO, OCC BANDS)   RBC Morphology RARE NUCLEATED RED BLOOD CELLS    Immature Granulocytes 8 %   Abs Immature Granulocytes 0.09 (H) 0.00 - 0.07 K/uL   Polychromasia PRESENT     Comment: Performed at [facility], [place] [address], [place],  [address]  Comprehensive metabolic panel     Status: Abnormal   Collection Time: 06/01/18  5:49 AM  Result Value Ref Range   Sodium 143 135 - 145 mmol/L   Potassium 3.7 3.5 - 5.1 mmol/L   Chloride 116 (H) 98 - 111 mmol/L   CO2 20 (L) 22 - 32 mmol/L   Glucose, Bld 106 (H) 70 - 99 mg/dL   BUN 28 (H) 8 - 23 mg/dL   Creatinine, Ser 1.00 0.61 - 1.24 mg/dL   Calcium 8.0 (L) 8.9 - 10.3 mg/dL   Total Protein 4.4 (L) 6.5 - 8.1 g/dL   Albumin 2.0 (L) 3.5 - 5.0 g/dL   AST 20 15 - 41 U/L   ALT 15 0 - 44  U/L   Alkaline Phosphatase 59 38 - 126 U/L   Total Bilirubin 0.6 0.3 - 1.2 mg/dL   GFR calc non Af Amer >60 >60 mL/min   GFR calc Af Amer >60 >60 mL/min   Anion gap 7 5 - 15    Comment: Performed at [facility], [place] [address] [place], [place] [address]  Prepare Pheresed Platelets     Status: None (Preliminary result)   Collection Time: 06/01/18  2:55 PM  Result Value Ref Range   Unit Number [id number]    Blood Component Type PLTPH LI2 PAS    Unit division 00    Status of Unit ISSUED,FINAL    Transfusion Status      OK TO TRANSFUSE Performed at [place] [address] [place], [place] [address]    Unit Number [id number]    Blood Component Type PLTPHER LR2    Unit division 00    Status of Unit ISSUED    Transfusion Status OK TO TRANSFUSE    Unit Number [id number]    Blood Component Type PLTPHER LR2    Unit division 00    Status of Unit ISSUED    Transfusion Status OK TO TRANSFUSE   CBC with Differential/Platelet      Status: Abnormal   Collection Time: 06/02/18  8:38 AM  Result Value Ref Range   WBC 1.5 (L) 4.0 - 10.5 K/uL   RBC 1.69 (L) 4.22 - 5.81 MIL/uL   Hemoglobin 4.9 (LL) 13.0 - 17.0 g/dL    Comment: This critical result has verified and been called to [facility], I. RN by Raelyn Ensign on 02 10 2020 at [phone], and has been read back. CRITICAL RESULT VERIFIED.   HCT 15.1 (L) 39.0 - 52.0 %   MCV 89.3 80.0 - 100.0 fL   MCH 29.0 26.0 - 34.0 pg   MCHC 32.5 30.0 - 36.0 g/dL   RDW 14.8 11.5 - 15.5 %   Platelets 124 (L) 150 - 400 K/uL    Comment: DELTA CHECK NOTED POST TRANSFUSION SPECIMEN Immature Platelet Fraction may be clinically indicated, consider ordering this additional test [id number]    nRBC 4.0 (H) 0.0 - 0.2 %   Neutrophils Relative % 32 %   Neutro Abs 0.5 (L) 1.7 - 7.7 K/uL   Lymphocytes Relative 42 %   Lymphs Abs 0.6 (L) 0.7 - 4.0 K/uL   Monocytes Relative 16 %   Monocytes Absolute 0.2 0.1 - 1.0 K/uL   Eosinophils Relative 1 %   Eosinophils Absolute 0.0 0.0 - 0.5 K/uL   Basophils Relative 0 %   Basophils Absolute 0.0 0.0 - 0.1 K/uL   WBC Morphology MILD LEFT SHIFT (1-5% METAS, OCC MYELO, OCC BANDS)    RBC Morphology RARE NRBC'S    Immature Granulocytes 9 %   Abs Immature Granulocytes 0.14 (H) 0.00 - 0.07 K/uL   Polychromasia PRESENT     Comment: Performed at [facility], [place] [address] [place], [place] [address]  Comprehensive metabolic panel     Status: Abnormal   Collection Time: 06/02/18  8:38 AM  Result Value Ref Range   Sodium 146 (H) 135 - 145 mmol/L   Potassium 3.4 (L) 3.5 - 5.1 mmol/L   Chloride 119 (H) 98 - 111 mmol/L   CO2 19 (L) 22 - 32 mmol/L   Glucose, Bld 105 (H) 70 - 99 mg/dL   BUN 28 (H) 8 - 23 mg/dL   Creatinine, Ser 1.07  0.61 - 1.24 mg/dL   Calcium 8.1 (L) 8.9 - 10.3 mg/dL   Total Protein 4.8 (L) 6.5 - 8.1 g/dL   Albumin 2.2 (L) 3.5 - 5.0 g/dL   AST 18 15 - 41 U/L   ALT 14 0 - 44 U/L   Alkaline Phosphatase 55 38 - 126 U/L    Total Bilirubin 0.6 0.3 - 1.2 mg/dL   GFR calc non Af Amer >60 >60 mL/min   GFR calc Af Amer >60 >60 mL/min   Anion gap 8 5 - 15    Comment: Performed at [facility], [place] [address], [place], [place] [address]  CBC     Status: Abnormal   Collection Time: 06/02/18 10:32 AM  Result Value Ref Range   WBC 1.6 (L) 4.0 - 10.5 K/uL   RBC 1.81 (L) 4.22 - 5.81 MIL/uL   Hemoglobin 5.2 (LL) 13.0 - 17.0 g/dL    Comment: CRITICAL VALUE NOTED.  VALUE IS CONSISTENT WITH PREVIOUSLY REPORTED AND CALLED VALUE.   HCT 15.3 (L) 39.0 - 52.0 %   MCV 84.5 80.0 - 100.0 fL   MCH 28.7 26.0 - 34.0 pg   MCHC 34.0 30.0 - 36.0 g/dL   RDW 14.5 11.5 - 15.5 %   Platelets 124 (L) 150 - 400 K/uL   nRBC 4.4 (H) 0.0 - 0.2 %    Comment: Performed at [facility], [place] [address] [place], [place] [address]  Prepare RBC     Status: None   Collection Time: 06/02/18 12:16 PM  Result Value Ref Range   Order Confirmation      ORDER PROCESSED BY BLOOD BANK Performed at [place] [address] [place], [place] [address]     Studies/Results: No results found.  Medications: I have reviewed the patient's current medications.  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 Clear liquid diet and n.p.o. postmidnight except colonic prep. Plan EGD and colonoscopy in a.m. as long as platelet count is above 50,000. Bleeding scan today   Ronnette Juniper 06/02/2018, 12:58 PM   Pager [phone] If no answer or after 5 PM call [phone]

## 2018-06-02 NOTE — Care Management Important Message (Signed)
Important Message  Patient Details  Name: Joel [name] MRN: [id number] Date of Birth: May 10, 1940   Medicare Important Message Given:  Yes    [name] 06/02/2018, 12:04 [place] Message  Patient Details  Name: Joel [name] MRN: [id number] Date of Birth: 1940-06-06   Medicare Important Message Given:  Yes    [name] 06/02/2018, 12:04 PM

## 2018-06-02 NOTE — Plan of Care (Signed)
Explained process for platelet transfusion to pt and spouse.

## 2018-06-02 NOTE — Progress Notes (Signed)
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  GI was consulted they are not going to be able to do EGD until his platelet count is improved  SUBJECTIVE, put a lot of abdominal pain last night.  Also had 3 bowel movement with last bowel movement being bright red blood per rectum. This morning does not have any nausea or vomiting no fever no chills.  No chest pain.  No further bowel movement during the day.  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 GI  Bleed:  Started on IV Protonix infusion. S/P 3 platelet transfusion as well as multiple PRBC transfusion. H&H has dropped significantly. We will provide 2 units of PRBC and recheck the CBC and transfuse for hemoglobin less than 7. Patient has stabilized from platelet perspective. We will recheck tomorrow. Patient is scheduled for EGD colonoscopy tomorrow. Appreciate GI consultation. Also recommended nuclear bleeding scan which is also currently negative.  2.  Urinary tract infection Complete antibiotic. Outpatient follow-up.  3.  Pancytopenia with thrombocytopenia patient received platelet transfusion 4.  AVM causing his GI bleed as well continue to monitor he is on n.p.o. except clear liquids.  Patient is transfusion dependent.  5 PRBC transfusion as well as 3 platelet transfusion.  5.  Mantle cell carcinoma  and prostate cancer he is being followed by oncologist Dr. Alen Blew prognosis is poor per oncologist note We will consult palliative care for goals of care conversation.  6.  Neutropenia white count is 1.0 his has been put on neutropenic precaution  7. thrombocytopenia received multiple platelet transfusion.  Will monitor.   Code Status: Full  Family Communication: family at bedside  Disposition Plan: Home when stable   Consultants: gastroenterology on-call, Dr. Therisa Doyne Dr. Marin Olp oncology  Procedures:  Blood transfusion  Antimicrobials:  Augmentin  DVT prophylaxis: SCD   Objective: Vitals:   06/02/18 1357 06/02/18 1615 06/02/18 1748 06/02/18 1814  BP: 127/73 135/76 (!) 141/93 138/88  Pulse: 84 84 92 80  Resp: 16 14 20 20   Temp: 98.7 F (37.1 C) 97.9 F (36.6 C) 98.2 F (36.8 C) 98.7 F (37.1 C)  TempSrc: Oral Oral Oral Oral  SpO2: 100% 100% 100% 100%  Weight:      Height:        Intake/Output Summary (Last 24 hours) at 06/02/2018 1930 Last data filed at 06/02/2018 1800 Gross per 24 hour  Intake 4537.99 ml  Output 325 ml  Net 4212.99 ml   Filed Weights   05/30/18 1629 05/30/18 2200  Weight: 50 kg 54.1 kg   Body mass index is 18.67 kg/m.  Exam:  . General: 78 y.o. year-old male well developed malnourished cachectic, in no acute distress.  Alert and oriented x3. . Cardiovascular: Regular rate and rhythm with no rubs or gallops.  No thyromegaly or JVD noted.   Marland Kitchen Respiratory: Clear to auscultation with no  wheezes or rales. Good inspiratory effort. . Abdomen: Soft nontender nondistended with normal bowel sounds x4 quadrants. . Musculoskeletal: No lower extremity edema. 2/4 pulses in all 4 extremities. . Skin: No ulcerative lesions noted or rashes, . Psychiatry: Mood is appropriate for condition and setting    Data Reviewed: CBC: Recent Labs  Lab 05/27/18 0332 05/30/18 1527 05/31/18 0821 06/01/18 0549 06/02/18 0838 06/02/18 1032  WBC 1.9* 1.6*  1.0* 1.1* 1.5* 1.6*  NEUTROABS 1.0* 0.7*  --  0.5* 0.5*  --   HGB 8.4* 4.6* 6.5* 7.3* 4.9* 5.2*  HCT 26.3* 14.6* 19.9* 22.6* 15.1* 15.3*  MCV 88.3 89.0 88.8 89.3 89.3 84.5  PLT 58* 26* 22* 20* 124* 096*   Basic Metabolic Panel: Recent Labs  Lab 05/27/18 0332 05/30/18 1639 05/31/18 0821 06/01/18 0549 06/02/18 0838  NA 141 143 144 143 146*  K 3.7 4.2 3.6 3.7 3.4*  CL 107 108 113* 116* 119*  CO2 24 25 22  20* 19*  GLUCOSE 123* 112* 94 106* 105*  BUN 23 48* 44* 28* 28*  CREATININE 1.09 1.15 1.06 1.00 1.07  CALCIUM 8.4* 9.3 8.3* 8.0* 8.1*   GFR: Estimated Creatinine Clearance: 45.6 mL/min (by C-G formula based on SCr of 1.07 mg/dL). Liver Function Tests: Recent Labs  Lab 05/31/18 0816 06/01/18 0549 06/02/18 0838  AST 29 20 18   ALT 18 15 14   ALKPHOS 66 59 55  BILITOT 0.6 0.6 0.6  PROT 4.8* 4.4* 4.8*  ALBUMIN 2.1* 2.0* 2.2*   No results for input(s): LIPASE, AMYLASE in the last 168 hours. No results for input(s): AMMONIA in the last 168 hours. Coagulation Profile: No results for input(s): INR, PROTIME in the last 168 hours. Cardiac Enzymes: No results for input(s): CKTOTAL, CKMB, CKMBINDEX, TROPONINI in the last 168 hours. BNP (last 3 results) No results for input(s): PROBNP in the last 8760 hours. HbA1C: No results for input(s): HGBA1C in the last 72 hours. CBG: No results for input(s): GLUCAP in the last 168 hours. Lipid Profile: No results for input(s): CHOL, HDL, LDLCALC, TRIG, CHOLHDL, LDLDIRECT in the last 72 hours. Thyroid Function Tests: No results for input(s): TSH, T4TOTAL, FREET4, T3FREE, THYROIDAB in the last 72 hours. Anemia Panel: No results for input(s): VITAMINB12, FOLATE, FERRITIN, TIBC, IRON, RETICCTPCT in the last 72 hours. Urine analysis:    Component Value Date/Time   COLORURINE YELLOW 05/17/2018 0757   APPEARANCEUR HAZY (A) 05/17/2018 0757   LABSPEC 1.013 05/17/2018 0757   PHURINE 6.0 05/17/2018 0757   GLUCOSEU NEGATIVE 05/17/2018 0757    HGBUR NEGATIVE 05/17/2018 0757   BILIRUBINUR NEGATIVE 05/17/2018 0757   BILIRUBINUR negative 07/05/2015 1801   KETONESUR NEGATIVE 05/17/2018 0757   PROTEINUR NEGATIVE 05/17/2018 0757   UROBILINOGEN 0.2 07/05/2015 1801   NITRITE NEGATIVE 05/17/2018 0757   LEUKOCYTESUR LARGE (A) 05/17/2018 0757   Sepsis Labs: @LABRCNTIP (procalcitonin:4,lacticidven:4)  ) Recent Results (from the past 240 hour(s))  Culture, blood (Routine X 2) w Reflex to ID Panel     Status: None   Collection Time: 05/25/18  8:43 AM  Result Value Ref Range Status   Specimen Description   Final    BLOOD RIGHT ANTECUBITAL Performed at [facility], [place] [address], [place], [place] [address]    Special Requests   Final    BOTTLES DRAWN AEROBIC AND ANAEROBIC Blood Culture adequate volume Performed at [place] [facility] [address], [place], [place] [address]    Culture   Final    NO GROWTH 5  DAYS Performed at [facility], [place] [address] [place], [place] [address]    Report Status 05/30/2018 FINAL  Final  Culture, blood (Routine X 2) w Reflex to ID Panel     Status: None   Collection Time: 05/25/18  8:47 AM  Result Value Ref Range Status   Specimen Description   Final    BLOOD LEFT HAND Performed at [place] [address], [place], [place] [address]    Special Requests   Final    BOTTLES DRAWN AEROBIC AND ANAEROBIC Blood Culture adequate volume Performed at [place] [address], [place], [place] [address]    Culture   Final    NO GROWTH 5 DAYS Performed at [facility], [place] [address] [place],  [address]    Report Status 05/30/2018 FINAL  Final      Studies: Nm Gi Blood Loss  Result Date: 06/02/2018 CLINICAL DATA:  Anemia with bloody stools.  Undergoing transfusion. EXAM: NUCLEAR MEDICINE GASTROINTESTINAL BLEEDING SCAN TECHNIQUE: Sequential abdominal images were obtained following intravenous  administration of Tc-32m labeled red blood cells. RADIOPHARMACEUTICALS:  23.0 mCi Tc-62m pertechnetate in-vitro labeled red cells. COMPARISON:  Abdominopelvic CT 02/18/2018. FINDINGS: Through 2 hours of imaging, no gastrointestinal bleeding is identified. There is a good red cell tag with normal blood pool activity in the liver, spleen, genitalia and vasculature. Progressive bladder activity noted. IMPRESSION: No evidence of active gastrointestinal bleeding. Electronically Signed   By: Richardean Sale M.D.   On: 06/02/2018 17:10    Scheduled Meds: . sodium chloride   Intravenous Once  . feeding supplement (ENSURE ENLIVE)  237 mL Oral BID BM  . [START ON 06/03/2018] pantoprazole  40 mg Intravenous Q12H    Continuous Infusions: . pantoprozole (PROTONIX) infusion 8 mg/hr (06/02/18 1237)     LOS: 3 days     Berle Mull, MD [facility]  To reach me or the doctor on call, go to: [email or web address]  06/02/2018, 7:30 PM

## 2018-06-02 NOTE — Progress Notes (Signed)
CRITICAL VALUE ALERT  Critical Value: Hemoglobin 4.9  Date & Time Notied:  06/02/18 at 0945am  Provider Notified: Dr. Posey Pronto   Orders Received/Actions taken: orders written

## 2018-06-02 NOTE — Progress Notes (Addendum)
Subjective: The patient was seen and examined at bedside.  He states he has had 2 bloody bowel movements overnight.  Objective: Vital signs in last 24 hours: Temp:  [97.5 F (36.4 C)-99 F (37.2 C)] 98 F (36.7 C) (02/10 0545) Pulse Rate:  [88-108] 91 (02/10 0545) Resp:  [16-20] 16 (02/10 0545) BP: (101-131)/(69-78) 131/74 (02/10 0545) SpO2:  [98 %-100 %] 100 % (02/10 0545) Weight change:  Last BM Date: 05/31/18  WU:[id number], frail, elderly GENERAL: Prominent pallor ABDOMEN: Soft, nontender EXTREMITIES: No deformity  Lab Results: Results for orders placed or performed during the hospital encounter of 05/30/18 (from the past 48 hour(s))  Prepare RBC     Status: None   Collection Time: 05/31/18  6:32 PM  Result Value Ref Range   Order Confirmation      ORDER PROCESSED BY BLOOD BANK Performed at [facility], [place] [address] [place], [place] [address]   CBC with Differential/Platelet     Status: Abnormal   Collection Time: 06/01/18  5:49 AM  Result Value Ref Range   WBC 1.1 (LL) 4.0 - 10.5 K/uL    Comment: REPEATED TO VERIFY CRITICAL VALUE NOTED.  VALUE IS CONSISTENT WITH PREVIOUSLY REPORTED AND CALLED VALUE.    RBC 2.53 (L) 4.22 - 5.81 MIL/uL   Hemoglobin 7.3 (L) 13.0 - 17.0 g/dL   HCT 22.6 (L) 39.0 - 52.0 %   MCV 89.3 80.0 - 100.0 fL   MCH 28.9 26.0 - 34.0 pg   MCHC 32.3 30.0 - 36.0 g/dL   RDW 14.4 11.5 - 15.5 %   Platelets 20 (LL) 150 - 400 K/uL    Comment: REPEATED TO VERIFY Immature Platelet Fraction may be clinically indicated, consider ordering this additional test [id number] CRITICAL VALUE NOTED.  VALUE IS CONSISTENT WITH PREVIOUSLY REPORTED AND CALLED VALUE.    nRBC 6.2 (H) 0.0 - 0.2 %   Neutrophils Relative % 41 %   Neutro Abs 0.5 (L) 1.7 - 7.7 K/uL   Lymphocytes Relative 40 %   Lymphs Abs 0.5 (L) 0.7 - 4.0 K/uL   Monocytes Relative 10 %   Monocytes Absolute 0.1 0.1 - 1.0 K/uL   Eosinophils Relative 0 %   Eosinophils Absolute 0.0  0.0 - 0.5 K/uL   Basophils Relative 1 %   Basophils Absolute 0.0 0.0 - 0.1 K/uL   WBC Morphology DOHLE BODIES     Comment: MILD LEFT SHIFT (1-5% METAS, OCC MYELO, OCC BANDS)   RBC Morphology RARE NUCLEATED RED BLOOD CELLS    Immature Granulocytes 8 %   Abs Immature Granulocytes 0.09 (H) 0.00 - 0.07 K/uL   Polychromasia PRESENT     Comment: Performed at [facility], [place] [address] [place], [place] [address]  Comprehensive metabolic panel     Status: Abnormal   Collection Time: 06/01/18  5:49 AM  Result Value Ref Range   Sodium 143 135 - 145 mmol/L   Potassium 3.7 3.5 - 5.1 mmol/L   Chloride 116 (H) 98 - 111 mmol/L   CO2 20 (L) 22 - 32 mmol/L   Glucose, Bld 106 (H) 70 - 99 mg/dL   BUN 28 (H) 8 - 23 mg/dL   Creatinine, Ser 1.00 0.61 - 1.24 mg/dL   Calcium 8.0 (L) 8.9 - 10.3 mg/dL   Total Protein 4.4 (L) 6.5 - 8.1 g/dL   Albumin 2.0 (L) 3.5 - 5.0 g/dL   AST 20 15 - 41 U/L   ALT 15 0 - 44  U/L   Alkaline Phosphatase 59 38 - 126 U/L   Total Bilirubin 0.6 0.3 - 1.2 mg/dL   GFR calc non Af Amer >60 >60 mL/min   GFR calc Af Amer >60 >60 mL/min   Anion gap 7 5 - 15    Comment: Performed at [facility], [place] [address], [place], [place] [address]  Prepare Pheresed Platelets     Status: None (Preliminary result)   Collection Time: 06/01/18  2:55 PM  Result Value Ref Range   Unit Number [id number]    Blood Component Type PLTPH LI2 PAS    Unit division 00    Status of Unit ISSUED,FINAL    Transfusion Status      OK TO TRANSFUSE Performed at [place] [address] [place], [place] [address]    Unit Number [id number]    Blood Component Type PLTPHER LR2    Unit division 00    Status of Unit ISSUED    Transfusion Status OK TO TRANSFUSE    Unit Number [id number]    Blood Component Type PLTPHER LR2    Unit division 00    Status of Unit ISSUED    Transfusion Status OK TO TRANSFUSE   CBC with Differential/Platelet      Status: Abnormal   Collection Time: 06/02/18  8:38 AM  Result Value Ref Range   WBC 1.5 (L) 4.0 - 10.5 K/uL   RBC 1.69 (L) 4.22 - 5.81 MIL/uL   Hemoglobin 4.9 (LL) 13.0 - 17.0 g/dL    Comment: This critical result has verified and been called to [facility], [facility]. RN by Raelyn Ensign on 02 10 2020 at [phone], and has been read back. CRITICAL RESULT VERIFIED.   HCT 15.1 (L) 39.0 - 52.0 %   MCV 89.3 80.0 - 100.0 fL   MCH 29.0 26.0 - 34.0 pg   MCHC 32.5 30.0 - 36.0 g/dL   RDW 14.8 11.5 - 15.5 %   Platelets 124 (L) 150 - 400 K/uL    Comment: DELTA CHECK NOTED POST TRANSFUSION SPECIMEN Immature Platelet Fraction may be clinically indicated, consider ordering this additional test [id number]    nRBC 4.0 (H) 0.0 - 0.2 %   Neutrophils Relative % 32 %   Neutro Abs 0.5 (L) 1.7 - 7.7 K/uL   Lymphocytes Relative 42 %   Lymphs Abs 0.6 (L) 0.7 - 4.0 K/uL   Monocytes Relative 16 %   Monocytes Absolute 0.2 0.1 - 1.0 K/uL   Eosinophils Relative 1 %   Eosinophils Absolute 0.0 0.0 - 0.5 K/uL   Basophils Relative 0 %   Basophils Absolute 0.0 0.0 - 0.1 K/uL   WBC Morphology MILD LEFT SHIFT (1-5% METAS, OCC MYELO, OCC BANDS)    RBC Morphology RARE NRBC'S    Immature Granulocytes 9 %   Abs Immature Granulocytes 0.14 (H) 0.00 - 0.07 K/uL   Polychromasia PRESENT     Comment: Performed at [facility], [place] [address] [place], [place] [address]  Comprehensive metabolic panel     Status: Abnormal   Collection Time: 06/02/18  8:38 AM  Result Value Ref Range   Sodium 146 (H) 135 - 145 mmol/L   Potassium 3.4 (L) 3.5 - 5.1 mmol/L   Chloride 119 (H) 98 - 111 mmol/L   CO2 19 (L) 22 - 32 mmol/L   Glucose, Bld 105 (H) 70 - 99 mg/dL   BUN 28 (H) 8 - 23 mg/dL   Creatinine, Ser 1.07  0.61 - 1.24 mg/dL   Calcium 8.1 (L) 8.9 - 10.3 mg/dL   Total Protein 4.8 (L) 6.5 - 8.1 g/dL   Albumin 2.2 (L) 3.5 - 5.0 g/dL   AST 18 15 - 41 U/L   ALT 14 0 - 44 U/L   Alkaline Phosphatase 55 38 - 126 U/L    Total Bilirubin 0.6 0.3 - 1.2 mg/dL   GFR calc non Af Amer >60 >60 mL/min   GFR calc Af Amer >60 >60 mL/min   Anion gap 8 5 - 15    Comment: Performed at [facility], [place] [address], [place], [place] [address]  CBC     Status: Abnormal   Collection Time: 06/02/18 10:32 AM  Result Value Ref Range   WBC 1.6 (L) 4.0 - 10.5 K/uL   RBC 1.81 (L) 4.22 - 5.81 MIL/uL   Hemoglobin 5.2 (LL) 13.0 - 17.0 g/dL    Comment: CRITICAL VALUE NOTED.  VALUE IS CONSISTENT WITH PREVIOUSLY REPORTED AND CALLED VALUE.   HCT 15.3 (L) 39.0 - 52.0 %   MCV 84.5 80.0 - 100.0 fL   MCH 28.7 26.0 - 34.0 pg   MCHC 34.0 30.0 - 36.0 g/dL   RDW 14.5 11.5 - 15.5 %   Platelets 124 (L) 150 - 400 K/uL   nRBC 4.4 (H) 0.0 - 0.2 %    Comment: Performed at [facility], [place] [address], [place], [place] [address]  Prepare RBC     Status: None   Collection Time: 06/02/18 12:16 PM  Result Value Ref Range   Order Confirmation      ORDER PROCESSED BY BLOOD BANK Performed at [place] [address], [place], [place] [address]     Studies/Results: No results found.  Medications: I have reviewed the patient's current medications.  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 Clear liquid diet and n.p.o. postmidnight except colonic prep. Plan EGD and colonoscopy in a.m. as long as platelet count is above 50,000. Bleeding scan today   Ronnette Juniper 06/02/2018, 12:58 PM   Pager [phone] If no answer or after 5 PM call [phone]

## 2018-06-02 NOTE — Progress Notes (Signed)
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  He was hospitalized on February 7 with severe anemia and GI bleeding.  He has been receiving blood products including packed red cells and platelet transfusion in anticipation for possible repeat endoscopy.  Clinically, he reports feeling slightly better although continues to have bleeding per rectum overnight.  He denies any fevers chills or sweats.  He denies any changes in his mentation.  Denies any abdominal pain.  Objective:  Vital signs in last 24 hours: Temp:  [97.5 F (36.4 C)-99 F (37.2 C)] 98 F (36.7 C) (02/10 0545) Pulse Rate:  [86-108] 91 (02/10 0545) Resp:  [16-20] 16 (02/10 0545) BP: (101-131)/(69-81) 131/74 (02/10 0545) SpO2:  [98 %-100 %] 100 % (02/10 0545) Weight change:  Last BM Date: 05/31/18  Intake/Output from previous day: 02/09 0701 - 02/10 0700 In: 1113.5 [P.O.:120; Blood:756.5] Out: -     General appearance: Alert, awake without any distress. Head: Atraumatic without abnormalities Oropharynx: Without any thrush or ulcers. Eyes: No scleral icterus. Lymph nodes: No lymphadenopathy noted in the cervical, supraclavicular, or axillary nodes Heart:regular rate and rhythm, without any murmurs or gallops.   Lung: Clear to auscultation without any rhonchi, wheezes or dullness to percussion. Abdomin: Soft, nontender without any shifting dullness or ascites. Musculoskeletal: No clubbing or cyanosis. Neurological: No motor or sensory deficits. Skin: No rashes or lesions. Psychiatric: Mood and affect appeared normal.         Lab Results: Recent Labs    05/31/18 0821 06/01/18 0549  WBC 1.0* 1.1*  HGB 6.5* 7.3*  HCT 19.9* 22.6*  PLT 22* 20*    BMET Recent Labs    05/31/18 0821 06/01/18 0549  NA 144 143  K 3.6 3.7  CL 113* 116*  CO2 22 20*  GLUCOSE 94 106*   BUN 44* 28*  CREATININE 1.06 1.00  CALCIUM 8.3* 8.0*    Studies/Results: No results found.  Medications: I have reviewed the patient's current medications.  Assessment/Plan:  78 year old man with:   1.  Pancytopenia: Due to mantle cell lymphoma and ibrutinib treatment.  Bone marrow biopsy showed fibrosis and currently not on any active treatment.  I agree with the current management with supportive transfusion to keep his platelet count above 50,000 given his GI bleeding and a hemoglobin of at least 8.  2.  Hematochezia: GI is following possible repeat endoscopy in the future.  3.  Prognosis: Overall poor given he has 2 incurable malignancies and ongoing bleeding.  4.  Disposition: Not ready for discharge at this time given his low counts and active bleeding.   25 minutes was spent with the patient face-to-face today.  More than 50% of time was spent in reviewing laboratory data, differential diagnosis and addressing management issues with the patient and his family.    LOS: 3 days   Zola Button 06/02/2018, 7:48 AM

## 2018-06-03 ENCOUNTER — Encounter (HOSPITAL_COMMUNITY): Payer: Self-pay | Admitting: *Deleted

## 2018-06-03 ENCOUNTER — Inpatient Hospital Stay (HOSPITAL_COMMUNITY): Payer: Medicare HMO | Admitting: Registered Nurse

## 2018-06-03 ENCOUNTER — Encounter (HOSPITAL_COMMUNITY)
Admission: EM | Disposition: A | Discharge status: Home Health | Payer: Self-pay | Source: Home / Self Care | Attending: Family Medicine

## 2018-06-03 HISTORY — PX: HOT HEMOSTASIS: SHX5433

## 2018-06-03 HISTORY — PX: POLYPECTOMY: SHX5525

## 2018-06-03 HISTORY — PX: COLONOSCOPY WITH PROPOFOL: SHX5780

## 2018-06-03 HISTORY — PX: ESOPHAGOGASTRODUODENOSCOPY (EGD) WITH PROPOFOL: SHX5813

## 2018-06-03 LAB — TYPE AND SCREEN
ABO/RH(D): O POS
Antibody Screen: NEGATIVE
Unit division: 0
Unit division: 0
Unit division: 0
Unit division: 0
Unit division: 0

## 2018-06-03 LAB — BPAM RBC
BLOOD PRODUCT EXPIRATION DATE: 202003062359
Blood Product Expiration Date: 202003052359
Blood Product Expiration Date: 202003052359
Blood Product Expiration Date: 202003062359
Blood Product Expiration Date: 202003062359
ISSUE DATE / TIME: 202002071751
ISSUE DATE / TIME: 202002080341
ISSUE DATE / TIME: 202002082240
ISSUE DATE / TIME: 202002101333
ISSUE DATE / TIME: 202002101753
UNIT TYPE AND RH: 5100
UNIT TYPE AND RH: 5100
Unit Type and Rh: 5100
Unit Type and Rh: 5100
Unit Type and Rh: 5100

## 2018-06-03 LAB — CBC
HEMATOCRIT: 26.8 % — AB (ref 39.0–52.0)
Hemoglobin: 8.3 g/dL — ABNORMAL LOW (ref 13.0–17.0)
MCH: 28.8 pg (ref 26.0–34.0)
MCHC: 31 g/dL (ref 30.0–36.0)
MCV: 93.1 fL (ref 80.0–100.0)
Platelets: 106 10*3/uL — ABNORMAL LOW (ref 150–400)
RBC: 2.88 MIL/uL — ABNORMAL LOW (ref 4.22–5.81)
RDW: 14.6 % (ref 11.5–15.5)
WBC: 1.6 10*3/uL — AB (ref 4.0–10.5)
nRBC: 6.3 % — ABNORMAL HIGH (ref 0.0–0.2)

## 2018-06-03 LAB — COMPREHENSIVE METABOLIC PANEL
ALT: 15 U/L (ref 0–44)
AST: 20 U/L (ref 15–41)
Albumin: 2.3 g/dL — ABNORMAL LOW (ref 3.5–5.0)
Alkaline Phosphatase: 59 U/L (ref 38–126)
Anion gap: 10 (ref 5–15)
BUN: 20 mg/dL (ref 8–23)
CO2: 18 mmol/L — ABNORMAL LOW (ref 22–32)
Calcium: 8.2 mg/dL — ABNORMAL LOW (ref 8.9–10.3)
Chloride: 115 mmol/L — ABNORMAL HIGH (ref 98–111)
Creatinine, Ser: 1.14 mg/dL (ref 0.61–1.24)
GFR calc Af Amer: >60 mL/min (ref >60)
GFR calc non Af Amer: >60 mL/min (ref >60)
Glucose, Bld: 82 mg/dL (ref 70–99)
Potassium: 3.5 mmol/L (ref 3.5–5.1)
SODIUM: 143 mmol/L (ref 135–145)
TOTAL PROTEIN: 4.9 g/dL — AB (ref 6.5–8.1)
Total Bilirubin: 0.8 mg/dL (ref 0.3–1.2)

## 2018-06-03 LAB — PREPARE PLATELET PHERESIS
UNIT DIVISION: 0
Unit division: 0
Unit division: 0

## 2018-06-03 LAB — BPAM PLATELET PHERESIS
Blood Product Expiration Date: 202002092359
Blood Product Expiration Date: 202002102359
Blood Product Expiration Date: 202002102359
ISSUE DATE / TIME: 202002092158
ISSUE DATE / TIME: 202002100109
ISSUE DATE / TIME: 202002100322
UNIT TYPE AND RH: 8400
Unit Type and Rh: 5100
Unit Type and Rh: 6200

## 2018-06-03 LAB — CBC WITH DIFFERENTIAL/PLATELET
ABS IMMATURE GRANULOCYTES: 0.11 10*3/uL — AB (ref 0.00–0.07)
Basophils Absolute: 0 10*3/uL (ref 0.0–0.1)
Basophils Relative: 1 %
Eosinophils Absolute: 0 10*3/uL (ref 0.0–0.5)
Eosinophils Relative: 0 %
HCT: 23.3 % — ABNORMAL LOW (ref 39.0–52.0)
Hemoglobin: 7.6 g/dL — ABNORMAL LOW (ref 13.0–17.0)
Immature Granulocytes: 8 %
Lymphocytes Relative: 42 %
Lymphs Abs: 0.6 10*3/uL — ABNORMAL LOW (ref 0.7–4.0)
MCH: 28.7 pg (ref 26.0–34.0)
MCHC: 32.6 g/dL (ref 30.0–36.0)
MCV: 87.9 fL (ref 80.0–100.0)
Monocytes Absolute: 0.2 10*3/uL (ref 0.1–1.0)
Monocytes Relative: 11 %
NEUTROS PCT: 38 %
Neutro Abs: 0.5 10*3/uL — ABNORMAL LOW (ref 1.7–7.7)
Platelets: 86 10*3/uL — ABNORMAL LOW (ref 150–400)
RBC: 2.65 MIL/uL — ABNORMAL LOW (ref 4.22–5.81)
RDW: 14.8 % (ref 11.5–15.5)
WBC: 1.3 10*3/uL — CL (ref 4.0–10.5)
nRBC: 6.8 % — ABNORMAL HIGH (ref 0.0–0.2)

## 2018-06-03 LAB — MAGNESIUM: Magnesium: 1.8 mg/dL (ref 1.7–2.4)

## 2018-06-03 SURGERY — ESOPHAGOGASTRODUODENOSCOPY (EGD) WITH PROPOFOL
Anesthesia: Monitor Anesthesia Care

## 2018-06-03 MED ORDER — LACTATED RINGERS IV SOLN
INTRAVENOUS | Status: DC | PRN
  Administered 2018-06-03: 07:00:00 via INTRAVENOUS

## 2018-06-03 MED ORDER — PROPOFOL 10 MG/ML IV BOLUS
INTRAVENOUS | Status: DC | PRN
  Administered 2018-06-03: 10 mg via INTRAVENOUS
  Administered 2018-06-03: 20 mg via INTRAVENOUS

## 2018-06-03 MED ORDER — PROPOFOL 500 MG/50ML IV EMUL
INTRAVENOUS | Status: DC | PRN
  Administered 2018-06-03: 125 ug/kg/min via INTRAVENOUS

## 2018-06-03 MED ORDER — EPHEDRINE SULFATE-NACL 50-0.9 MG/10ML-% IV SOSY
PREFILLED_SYRINGE | INTRAVENOUS | Status: DC | PRN
  Administered 2018-06-03 (×2): 5 mg via INTRAVENOUS

## 2018-06-03 MED ORDER — SODIUM CHLORIDE 0.9 % IV SOLN: INTRAVENOUS | Status: DC

## 2018-06-03 MED ORDER — PROPOFOL 10 MG/ML IV BOLUS
INTRAVENOUS | Status: AC
  Filled 2018-06-03: qty 40

## 2018-06-03 MED ORDER — PANTOPRAZOLE SODIUM 40 MG PO TBEC
40.0000 mg | DELAYED_RELEASE_TABLET | Freq: Two times a day (BID) | ORAL | Status: DC
  Administered 2018-06-03 – 2018-06-05 (×4): 40 mg via ORAL
  Filled 2018-06-03 (×3): qty 1

## 2018-06-03 SURGICAL SUPPLY — 25 items

## 2018-06-03 NOTE — Op Note (Signed)
[facility] Patient Name: Joel [name] Procedure Date: 06/03/2018 MRN: [id number] Attending MD: Ronnette Juniper , MD Date of Birth: April 12, 1941 CSN: [id number] Age: 78 Admit Type: Inpatient Procedure:                Upper GI endoscopy Indications:              Melena Providers:                Ronnette Juniper, MD, Angus Seller, Janie Billups,                            Technician, Courtney Heys [place], CRNA Referring MD:              Medicines:                Monitored Anesthesia Care Complications:            No immediate complications. Estimated blood loss:                            None. Estimated Blood Loss:     Estimated blood loss: none. Procedure:                Pre-Anesthesia Assessment:                           - Prior to the procedure, a History and Physical                            was performed, and patient medications and                            allergies were reviewed. The patient's tolerance of                            previous anesthesia was also reviewed. The risks                            and benefits of the procedure and the sedation                            options and risks were discussed with the patient.                            All questions were answered, and informed consent                            was obtained. Prior Anticoagulants: The patient has                            taken no previous anticoagulant or antiplatelet                            agents. ASA Grade Assessment: III - A patient with                            severe systemic  disease. After reviewing the risks                            and benefits, the patient was deemed in                            satisfactory condition to undergo the procedure.                           After obtaining informed consent, the endoscope was                            passed under direct vision. Throughout the                            procedure, the patient's blood pressure, pulse,  and                            oxygen saturations were monitored continuously. The                            [id number]-H190 ([id number]) Olympus gastroscope was                            introduced through the mouth, and advanced to the                            second part of duodenum. The upper GI endoscopy was                            accomplished without difficulty. The patient                            tolerated the procedure well. Scope In: Scope Out: Findings:      The examined esophagus was normal.      The Z-line was regular and was found 38 cm from the incisors.      A single 4 mm no bleeding angiodysplastic lesion was found at the       incisura. Coagulation for tissue destruction using argon plasma at 1       liter/minute and 20 watts was successful.      One non-bleeding superficial gastric ulcer with a clean ulcer base       (Forrest Class III) was found at the incisura. The lesion was 4 mm in       largest dimension. 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      The cardia and gastric fundus were normal on retroflexion.      A mild deformity was found in the first portion of the duodenum.       Significant looping encountered to advance the scope in to the second       portion of duodenum.      The duodenal bulb and second portion of the duodenum were normal. Impression:               - Normal esophagus.                           -  Z-line regular, 38 cm from the incisors.                           - A single non-bleeding angiodysplastic lesion in                            the stomach. Treated with argon plasma coagulation                            (APC).                           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                           - Duodenal deformity.                           - Normal duodenal bulb and second portion  of the                            duodenum.                           - No specimens collected. Moderate Sedation:      Patient did not receive moderate sedation for this procedure, but       instead received monitored anesthesia care. Recommendation:           - Perform a colonoscopy today. Procedure Code(s):        --- Professional ---                           [phone], Esophagogastroduodenoscopy, flexible,                            transoral; with ablation of tumor(s), polyp(s), or                            other lesion(s) (includes pre- and post-dilation                            and guide wire passage, when performed) Diagnosis Code(s):        --- Professional ---                           K31.819, Angiodysplasia of stomach and duodenum                            without bleeding  K25.9, Gastric ulcer, unspecified as acute or                            chronic, without hemorrhage or perforation                           K31.89, Other diseases of stomach and duodenum                           K92.1, Melena (includes Hematochezia) CPT copyright 2018 [employer] Association. All rights reserved. The codes documented in this report are preliminary and upon coder review may  be revised to meet current compliance requirements. Ronnette Juniper, MD 06/03/2018 9:05:33 AM This report has been signed electronically. Number of Addenda: 0

## 2018-06-03 NOTE — Transfer of Care (Signed)
Immediate Anesthesia Transfer of Care Note  Patient: Joel [name]  Procedure(s) Performed: ESOPHAGOGASTRODUODENOSCOPY (EGD) WITH PROPOFOL (N/A ) COLONOSCOPY WITH PROPOFOL (N/A ) HOT HEMOSTASIS (ARGON PLASMA COAGULATION/BICAP) (N/A )  Patient Location: PACU and Endoscopy Unit  Anesthesia Type:MAC  Level of Consciousness: awake, alert , oriented and patient cooperative  Airway & Oxygen Therapy: Patient Spontanous Breathing and Patient connected to nasal cannula oxygen  Post-op Assessment: Report given to RN and Post -op Vital signs reviewed and stable  Post vital signs: Reviewed and stable  Last Vitals:  Vitals Value Taken Time  BP    Temp    Pulse 81 06/03/2018  9:03 AM  Resp 19 06/03/2018  9:03 AM  SpO2 100 % 06/03/2018  9:03 AM  Vitals shown include unvalidated device data.  Last Pain:  Vitals:   06/03/18 0724  TempSrc: Oral  PainSc: 0-No pain         Complications: No apparent anesthesia complications

## 2018-06-03 NOTE — Anesthesia Preprocedure Evaluation (Signed)
Anesthesia Evaluation  Patient identified by MRN, date of birth, ID band Patient awake    Reviewed: Allergy & Precautions, NPO status , Patient's Chart, lab work & pertinent test results  Airway Mallampati: I  TM Distance: >3 FB Neck ROM: Full    Dental   Pulmonary former smoker,    Pulmonary exam normal        Cardiovascular Normal cardiovascular exam     Neuro/Psych    GI/Hepatic   Endo/Other    Renal/GU      Musculoskeletal   Abdominal   Peds  Hematology   Anesthesia Other Findings   Reproductive/Obstetrics                             Anesthesia Physical Anesthesia Plan  ASA: III  Anesthesia Plan: MAC   Post-op Pain Management:    Induction: Intravenous  PONV Risk Score and Plan: 1  Airway Management Planned: Simple Face Mask  Additional Equipment:   Intra-op Plan:   Post-operative Plan:   Informed Consent: I have reviewed the patients History and Physical, chart, labs and discussed the procedure including the risks, benefits and alternatives for the proposed anesthesia with the patient or authorized representative who has indicated his/her understanding and acceptance.       Plan Discussed with: CRNA and Surgeon  Anesthesia Plan Comments:         Anesthesia Quick Evaluation

## 2018-06-03 NOTE — Interval H&P Note (Signed)
History and Physical Interval Note: 77/male with pancytopenia, melena and hematochezia, history of gastric AVMS for an EGD and colonoscopy today.  06/03/2018 8:10 AM  Joel [name]  has presented today for EGD and colonoscopy with the diagnosis of Hematochezia and melena  The various methods of treatment have been discussed with the patient and family. After consideration of risks, benefits and other options for treatment, the patient has consented to  Procedure(s): ESOPHAGOGASTRODUODENOSCOPY (EGD) WITH PROPOFOL (N/A) COLONOSCOPY WITH PROPOFOL (N/A) as a surgical intervention .  The patient's history has been reviewed, patient examined, no change in status, stable for surgery.  I have reviewed the patient's chart and labs.  Questions were answered to the patient's satisfaction.     Ronnette Juniper

## 2018-06-03 NOTE — Progress Notes (Signed)
CRITICAL VALUE STICKER  CRITICAL VALUE: WBC 1.3  DATE & TIME NOTIFIED: 06/03/2018 7308  MESSENGER (representative from lab): Elmyra Ricks  MD NOTIFIED: Schorr  TIME OF NOTIFICATION: [id number]  RESPONSE: No new orders placed  Will continue to monitor the pt

## 2018-06-03 NOTE — Op Note (Signed)
[facility] Patient Name: Joel [name] Procedure Date: 06/03/2018 MRN: [id number] Attending MD: Ronnette Juniper , MD Date of Birth: 08/21/1940 CSN: [id number] Age: 78 Admit Type: Inpatient Procedure:                Colonoscopy Indications:              This is the patient's first colonoscopy,                            Hematochezia Providers:                Ronnette Juniper, MD, Angus Seller, Janie Billups,                            Technician, Courtney Heys Armistead, CRNA Referring MD:              Medicines:                Monitored Anesthesia Care Complications:            No immediate complications. Estimated blood loss:                            Minimal. Estimated Blood Loss:     Estimated blood loss was minimal. Procedure:                Pre-Anesthesia Assessment:                           - Prior to the procedure, a History and Physical                            was performed, and patient medications and                            allergies were reviewed. The patient's tolerance of                            previous anesthesia was also reviewed. The risks                            and benefits of the procedure and the sedation                            options and risks were discussed with the patient.                            All questions were answered, and informed consent                            was obtained. Prior Anticoagulants: The patient has                            taken no previous anticoagulant or antiplatelet                            agents. ASA Grade Assessment: III -  A patient with                            severe systemic disease. After reviewing the risks                            and benefits, the patient was deemed in                            satisfactory condition to undergo the procedure.                           - Prior to the procedure, a History and Physical                            was performed, and patient medications and                             allergies were reviewed. The patient's tolerance of                            previous anesthesia was also reviewed. The risks                            and benefits of the procedure and the sedation                            options and risks were discussed with the patient.                            All questions were answered, and informed consent                            was obtained. Prior Anticoagulants: The patient has                            taken no previous anticoagulant or antiplatelet                            agents. ASA Grade Assessment: III - A patient with                            severe systemic disease. After reviewing the risks                            and benefits, the patient was deemed in                            satisfactory condition to undergo the procedure.                           After obtaining informed consent, the colonoscope  was passed under direct vision. Throughout the                            procedure, the patient's blood pressure, pulse, and                            oxygen saturations were monitored continuously. The                            [id number]-[id number] ([id number] Olympus pediatric colonscope                            was introduced through the anus and advanced to the                            the cecum, identified by appendiceal orifice and                            ileocecal valve. The colonoscopy was performed                            without difficulty. The patient tolerated the                            procedure well. The quality of the bowel                            preparation was good. Scope In: 8:31:56 AM Scope Out: 8:53:40 AM Scope Withdrawal Time: 0 hours 15 minutes 7 seconds  Total Procedure Duration: 0 hours 21 minutes 44 seconds  Findings:      Hemorrhoids were found on perianal exam.      Non-bleeding internal hemorrhoids were found during retroflexion.  The       hemorrhoids were medium-sized.      Three sessile polyps were found in the transverse colon. The polyps were       7 to 8 mm in size. These polyps were removed with a hot snare. Resection       and retrieval were complete.      Two sessile polyps were found in the cecum. The polyps were 9 to 11 mm       in size. These polyps were removed with a hot snare. Resection and       retrieval were complete.      The exam was otherwise without abnormality. Impression:               - Hemorrhoids found on perianal exam.                           - Non-bleeding internal hemorrhoids.                           - Three 7 to 8 mm polyps in the transverse colon,                            removed with a hot snare. Resected and retrieved.                           -  Two 9 to 11 mm polyps in the cecum, removed with                            a hot snare. Resected and retrieved.                           - The examination was otherwise normal. Moderate Sedation:      Patient did not receive moderate sedation for this procedure, but       instead received monitored anesthesia care. Recommendation:           - Resume regular diet.                           - Continue present medications.                           - Await pathology results.                           - Repeat colonoscopy is not recommended due to age. Procedure Code(s):        --- Professional ---                           [phone], Colonoscopy, flexible; with removal of                            tumor(s), polyp(s), or other lesion(s) by snare                            technique Diagnosis Code(s):        --- Professional ---                           K64.8, Other hemorrhoids                           D12.3, Benign neoplasm of transverse colon (hepatic                            flexure or splenic flexure)                           D12.0, Benign neoplasm of cecum                           K92.1, Melena (includes Hematochezia) CPT  copyright 2018 American Medical Association. All rights reserved. The codes documented in this report are preliminary and upon coder review may  be revised to meet current compliance requirements. Ronnette Juniper, MD 06/03/2018 9:08:13 AM This report has been signed electronically. Number of Addenda: 0

## 2018-06-03 NOTE — Anesthesia Postprocedure Evaluation (Signed)
Anesthesia Post Note  Patient: Joel [name]  Procedure(s) Performed: ESOPHAGOGASTRODUODENOSCOPY (EGD) WITH PROPOFOL (N/A ) COLONOSCOPY WITH PROPOFOL (N/A ) HOT HEMOSTASIS (ARGON PLASMA COAGULATION/BICAP) (N/A ) POLYPECTOMY     Patient location during evaluation: PACU Anesthesia Type: MAC Level of consciousness: awake and alert Pain management: pain level controlled Vital Signs Assessment: post-procedure vital signs reviewed and stable Respiratory status: spontaneous breathing, nonlabored ventilation, respiratory function stable and patient connected to nasal cannula oxygen Cardiovascular status: stable and blood pressure returned to baseline Postop Assessment: no apparent nausea or vomiting Anesthetic complications: no    Last Vitals:  Vitals:   06/03/18 0910 06/03/18 0920  BP: 131/71 (!) 143/72  Pulse: 83 72  Resp: 18 15  Temp:    SpO2: 100% 100%    Last Pain:  Vitals:   06/03/18 1139  TempSrc:   PainSc: 0-No pain                 Kara Mierzejewski DAVID

## 2018-06-03 NOTE — Brief Op Note (Signed)
05/30/2018 - 06/03/2018  8:55 AM  PATIENT:  Joel [name]  78 y.o. male  PRE-OPERATIVE DIAGNOSIS:  Hematochezia and melena  POST-OPERATIVE DIAGNOSIS:  EGD - gastric AVM APC used   PROCEDURE:  Procedure(s): ESOPHAGOGASTRODUODENOSCOPY (EGD) WITH PROPOFOL (N/A) COLONOSCOPY WITH PROPOFOL (N/A) HOT HEMOSTASIS (ARGON PLASMA COAGULATION/BICAP) (N/A)  SURGEON:  Surgeon(s) and Role:    Ronnette Juniper, MD - Primary  PHYSICIAN ASSISTANT:   ASSISTANTS: Angus Seller, RN, Janie Billups, Tech  ANESTHESIA:   MAC  EBL:  None  BLOOD ADMINISTERED:none  DRAINS: none   LOCAL MEDICATIONS USED:  NONE  SPECIMEN:  Biopsy / Limited Resection  DISPOSITION OF SPECIMEN:  PATHOLOGY  COUNTS:  YES  TOURNIQUET:  * No tourniquets in log *  DICTATION: .Dragon Dictation  PLAN OF CARE: Admit to inpatient   PATIENT DISPOSITION:  PACU - hemodynamically stable.   Delay start of Pharmacological VTE agent (>24hrs) due to surgical blood loss or risk of bleeding: no

## 2018-06-03 NOTE — Progress Notes (Signed)
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  GI was consulted they are not going to be able to do EGD until his platelet count is improved  SUBJECTIVE, no acute pain.  No nausea no vomiting no fever no chills.  No further bleeding reported by patient.  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 GI  Bleed:  Started on IV Protonix infusion. S/P 3 platelet transfusion as well as multiple PRBC transfusion. H&H has dropped significantly. We will provide 2 units of PRBC and recheck the CBC and transfuse for hemoglobin less than 7. Patient has stabilized from platelet perspective. We will recheck tomorrow. When EGD colonoscopy.  Showed AVM.  Which was burned.  Monitor H&H. Appreciate GI consultation. Also recommended nuclear bleeding scan which is also currently negative.  2.  Urinary tract infection Complete antibiotic. Outpatient follow-up.  3.  Pancytopenia with thrombocytopenia patient received platelet transfusion 4.  AVM causing his GI bleed as well continue to monitor he is on n.p.o. except clear liquids.  Patient is transfusion dependent.  5 PRBC transfusion as well as 3 platelet transfusion.  5.  Mantle cell carcinoma and prostate cancer he is being followed by oncologist Dr. Alen Blew prognosis is poor per oncologist note We will consult palliative care for  goals of care conversation.  6.  Neutropenia white count is 1.0 his has been put on neutropenic precaution  7. thrombocytopenia received multiple platelet transfusion.  Will monitor.   Code Status: Full  Family Communication: family at bedside  Disposition Plan: Home when stable, likely tomorrow.   Consultants: gastroenterology on-call, Dr. Therisa Doyne Dr. Marin Olp oncology  Procedures:  Blood transfusion  Antimicrobials:  Augmentin  DVT prophylaxis: SCD   Objective: Vitals:   06/03/18 0910 06/03/18 0920 06/03/18 1245 06/03/18 1414  BP: 131/71 (!) 143/72 135/87 126/77  Pulse: 83 72 (!) 107 100  Resp: 18 15 18 20   Temp:   97.7 F (36.5 C) 98.4 F (36.9 C)  TempSrc:   Oral Oral  SpO2: 100% 100% 95% 99%  Weight:      Height:        Intake/Output Summary (Last 24 hours) at 06/03/2018 1931 Last data filed at 06/03/2018 1838 Gross per 24 hour  Intake 2261.18 ml  Output -  Net 2261.18 ml   Filed Weights   05/30/18 1629 05/30/18 2200  Weight: 50 kg 54.1 kg   Body mass index is 18.67 kg/m.  Exam:  . General: 78 y.o. year-old male well developed malnourished cachectic, in no acute distress.  Alert and oriented x3. . Cardiovascular: Regular rate and rhythm with no rubs or gallops.  No thyromegaly or JVD noted.   Marland Kitchen Respiratory: Clear to auscultation with no wheezes or rales. Good inspiratory effort. . Abdomen: Soft nontender nondistended with normal bowel sounds x4 quadrants. . Musculoskeletal: No lower extremity edema. 2/4 pulses in all 4  extremities. . Skin: No ulcerative lesions noted or rashes, . Psychiatry: Mood is appropriate for condition and setting    Data Reviewed: CBC: Recent Labs  Lab 05/30/18 1527  06/01/18 0549 06/02/18 0838 06/02/18 1032 06/02/18 2355 06/03/18 0553  WBC 1.6*   < > 1.1* 1.5* 1.6* 1.6* 1.3*  NEUTROABS 0.7*  --  0.5* 0.5*  --   --  0.5*  HGB 4.6*   < > 7.3* 4.9* 5.2* 8.3* 7.6*  HCT 14.6*   < > 22.6* 15.1* 15.3* 26.8* 23.3*    MCV 89.0   < > 89.3 89.3 84.5 93.1 87.9  PLT 26*   < > 20* 124* 124* 106* 86*   < > = values in this interval not displayed.   Basic Metabolic Panel: Recent Labs  Lab 05/30/18 1639 05/31/18 0821 06/01/18 0549 06/02/18 0838 06/03/18 0553  NA 143 144 143 146* 143  K 4.2 3.6 3.7 3.4* 3.5  CL 108 113* 116* 119* 115*  CO2 25 22 20* 19* 18*  GLUCOSE 112* 94 106* 105* 82  BUN 48* 44* 28* 28* 20  CREATININE 1.15 1.06 1.00 1.07 1.14  CALCIUM 9.3 8.3* 8.0* 8.1* 8.2*  MG  --   --   --   --  1.8   GFR: Estimated Creatinine Clearance: 42.8 mL/min (by C-G formula based on SCr of 1.14 mg/dL). Liver Function Tests: Recent Labs  Lab 05/31/18 0816 06/01/18 0549 06/02/18 0838 06/03/18 0553  AST 29 20 18 20   ALT 18 15 14 15   ALKPHOS 66 59 55 59  BILITOT 0.6 0.6 0.6 0.8  PROT 4.8* 4.4* 4.8* 4.9*  ALBUMIN 2.1* 2.0* 2.2* 2.3*   No results for input(s): LIPASE, AMYLASE in the last 168 hours. No results for input(s): AMMONIA in the last 168 hours. Coagulation Profile: No results for input(s): INR, PROTIME in the last 168 hours. Cardiac Enzymes: No results for input(s): CKTOTAL, CKMB, CKMBINDEX, TROPONINI in the last 168 hours. BNP (last 3 results) No results for input(s): PROBNP in the last 8760 hours. HbA1C: No results for input(s): HGBA1C in the last 72 hours. CBG: No results for input(s): GLUCAP in the last 168 hours. Lipid Profile: No results for input(s): CHOL, HDL, LDLCALC, TRIG, CHOLHDL, LDLDIRECT in the last 72 hours. Thyroid Function Tests: No results for input(s): TSH, T4TOTAL, FREET4, T3FREE, THYROIDAB in the last 72 hours. Anemia Panel: No results for input(s): VITAMINB12, FOLATE, FERRITIN, TIBC, IRON, RETICCTPCT in the last 72 hours. Urine analysis:    Component Value Date/Time   COLORURINE YELLOW 05/17/2018 0757   APPEARANCEUR HAZY (A) 05/17/2018 0757   LABSPEC 1.013 05/17/2018 0757   PHURINE 6.0 05/17/2018 0757   GLUCOSEU NEGATIVE 05/17/2018 0757   HGBUR  NEGATIVE 05/17/2018 0757   BILIRUBINUR NEGATIVE 05/17/2018 0757   BILIRUBINUR negative 07/05/2015 1801   KETONESUR NEGATIVE 05/17/2018 0757   PROTEINUR NEGATIVE 05/17/2018 0757   UROBILINOGEN 0.2 07/05/2015 1801   NITRITE NEGATIVE 05/17/2018 0757   LEUKOCYTESUR LARGE (A) 05/17/2018 0757   Sepsis Labs: @LABRCNTIP (procalcitonin:4,lacticidven:4)  ) Recent Results (from the past 240 hour(s))  Culture, blood (Routine X 2) w Reflex to ID Panel     Status: None   Collection Time: 05/25/18  8:43 AM  Result Value Ref Range Status   Specimen Description   Final    BLOOD RIGHT ANTECUBITAL Performed at [facility], [place] [address], [place], [place] [address]    Special Requests   Final    BOTTLES DRAWN AEROBIC AND  ANAEROBIC Blood Culture adequate volume Performed at [facility] [address], [place], [place] [address]    Culture   Final    NO GROWTH 5 DAYS Performed at [facility], [place] [address], [place], [place] [address]    Report Status 05/30/2018 FINAL  Final  Culture, blood (Routine X 2) w Reflex to ID Panel     Status: None   Collection Time: 05/25/18  8:47 AM  Result Value Ref Range Status   Specimen Description   Final    BLOOD LEFT HAND Performed at [facility] [address] [place], [place] [address]    Special Requests   Final    BOTTLES DRAWN AEROBIC AND ANAEROBIC Blood Culture adequate volume Performed at [facility] [address], [place], [place] [address]    Culture   Final    NO GROWTH 5 DAYS Performed at [facility], [place] [address], [place], [place] [address]    Report Status 05/30/2018 FINAL  Final      Studies: No results found.  Scheduled Meds: . feeding supplement (ENSURE ENLIVE)  237 mL Oral BID BM  . pantoprazole  40 mg Oral BID AC    Continuous Infusions:    LOS: 4 days     Berle Mull, MD [facility]  To reach me or the doctor  on call, go to: [email or web address]  06/03/2018, 7:31 PM

## 2018-06-03 NOTE — Op Note (Signed)
EGD and colonoscopy was performed for melena and hematochezia in the setting of pancytopenia/severe thrombocytopenia.  Findings: Normal-appearing esophagus, GE junction regular at 38 cm from incisors. A small nonbleeding AVM noted at incisura, APC performed at 20 W, 1 L/min. Prior site of APC noted with small clean-based ulceration. The cardia and fundus appeared unremarkable on retroflexion. Duodenal bulb was normal, some deformity noted at duodenal sweep, scope advanced into the second portion of the duodenum after significant looping, however, no mucosal abnormality noted.  3 transverse colon polyps removed via hot snare polypectomy, the polyps were between 7 to 8 mm in size. 2 cecal polyps removed via hot snare polypectomy, polyps were between 9 to 11 mm in size. The rest of the colon including retroflexion was unremarkable,except medium-sized hemorrhoids noted on retroflexion. Small external hemorrhoids noted during the digital rectal examination.   Recommendations: Resume regular diet. PPI twice daily. If thrombocytopenia improves, but patient has evidence of GI bleed, commend small bowel PillCam evaluation, may be done as an outpatient. Monitor H&H and transfuse as needed.  Ronnette Juniper, MD

## 2018-06-03 NOTE — Progress Notes (Signed)
Pt unable to finish bowel prep before midnight. Pt was explained multiple times about the importance of taking the bowel prep and why it was necessary for the procedure. The pt was encouraged multiple times to continue drinking the prep. Pt was advised he has until 0200 to finish the prep, per charge RN. MD will be notified so he is aware. Will continue to monitor the pt.

## 2018-06-04 ENCOUNTER — Encounter (HOSPITAL_COMMUNITY): Payer: Self-pay | Admitting: Gastroenterology

## 2018-06-04 DIAGNOSIS — K31819 Angiodysplasia of stomach and duodenum without bleeding: Secondary | ICD-10-CM

## 2018-06-04 DIAGNOSIS — Z515 Encounter for palliative care: Secondary | ICD-10-CM

## 2018-06-04 DIAGNOSIS — Z7189 Other specified counseling: Secondary | ICD-10-CM

## 2018-06-04 DIAGNOSIS — C8311 Mantle cell lymphoma, lymph nodes of head, face, and neck: Secondary | ICD-10-CM

## 2018-06-04 LAB — CBC WITH DIFFERENTIAL/PLATELET
Abs Immature Granulocytes: 0.11 10*3/uL — ABNORMAL HIGH (ref 0.00–0.07)
BASOS PCT: 1 %
Basophils Absolute: 0 10*3/uL (ref 0.0–0.1)
EOS ABS: 0 10*3/uL (ref 0.0–0.5)
Eosinophils Relative: 0 %
HCT: 24.3 % — ABNORMAL LOW (ref 39.0–52.0)
Hemoglobin: 7.8 g/dL — ABNORMAL LOW (ref 13.0–17.0)
Immature Granulocytes: 6 %
Lymphocytes Relative: 43 %
Lymphs Abs: 0.8 10*3/uL (ref 0.7–4.0)
MCH: 28.3 pg (ref 26.0–34.0)
MCHC: 32.1 g/dL (ref 30.0–36.0)
MCV: 88 fL (ref 80.0–100.0)
Monocytes Absolute: 0.2 10*3/uL (ref 0.1–1.0)
Monocytes Relative: 10 %
Neutro Abs: 0.7 10*3/uL — ABNORMAL LOW (ref 1.7–7.7)
Neutrophils Relative %: 40 %
Platelets: 78 10*3/uL — ABNORMAL LOW (ref 150–400)
RBC: 2.76 MIL/uL — ABNORMAL LOW (ref 4.22–5.81)
RDW: 15.1 % (ref 11.5–15.5)
WBC: 1.8 10*3/uL — AB (ref 4.0–10.5)
nRBC: 7.3 % — ABNORMAL HIGH (ref 0.0–0.2)

## 2018-06-04 LAB — COMPREHENSIVE METABOLIC PANEL
ALT: 14 U/L (ref 0–44)
AST: 22 U/L (ref 15–41)
Albumin: 2.3 g/dL — ABNORMAL LOW (ref 3.5–5.0)
Alkaline Phosphatase: 68 U/L (ref 38–126)
Anion gap: 9 (ref 5–15)
BUN: 18 mg/dL (ref 8–23)
CO2: 20 mmol/L — ABNORMAL LOW (ref 22–32)
Calcium: 8.5 mg/dL — ABNORMAL LOW (ref 8.9–10.3)
Chloride: 116 mmol/L — ABNORMAL HIGH (ref 98–111)
Creatinine, Ser: 1.18 mg/dL (ref 0.61–1.24)
GFR calc Af Amer: >60 mL/min (ref >60)
GFR calc non Af Amer: >60 mL/min (ref >60)
Glucose, Bld: 110 mg/dL — ABNORMAL HIGH (ref 70–99)
Potassium: 3.5 mmol/L (ref 3.5–5.1)
Sodium: 145 mmol/L (ref 135–145)
TOTAL PROTEIN: 5 g/dL — AB (ref 6.5–8.1)
Total Bilirubin: 0.5 mg/dL (ref 0.3–1.2)

## 2018-06-04 NOTE — Progress Notes (Signed)
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 : S/p IV Protonix infusion and multiple PRBC transfusions and platelet transfusion. S/p EGD and colonoscopy.  She was found to have AVMs which would explain his GI bleed. Appreciate GI recommendations at this time.  Patient's hemoglobin has stabilized at 7.8 and platelets are at 78,000.    Urinary tract infection Completed the course of antibiotic.    Mantle cell carcinoma and prostate: Cancer Dr. Alen Blew saw the patient 2 days ago suggested poor prognosis. Patient's family wants to talk to Dr Alen Blew prior to being discharged. palliative care consulted and family wants to continue with aggressive management at this time     DVT prophylaxis: S CDs Code Status: (Full code Family Communication: Daughter at bedside  disposition Plan: Possible discharge in the next 24 hours after discussing with oncology.   Consultants:   Oncology Dr. Alen Blew  Gastroenterology Dr. Therisa Doyne  Procedures: Multiple platelet and PRBC transfusion  Antimicrobials: None  Subjective: No new complaints  Objective: Vitals:   06/03/18 1414 06/03/18 2014 06/04/18 0458 06/04/18 1407  BP: 126/77 135/78 (!) 146/88 (!) 145/90  Pulse: 100 88 89 (!) 104  Resp: 20 14 16 16   Temp:  98.4 F (36.9 C) 98.4 F (36.9 C) 98.3 F (36.8 C) 99.3 F (37.4 C)  TempSrc: Oral Oral Oral Oral  SpO2: 99% 99% 98% 100%  Weight:      Height:        Intake/Output Summary (Last 24 hours) at 06/04/2018 1709 Last data filed at 06/04/2018 0600 Gross per 24 hour  Intake 780 ml  Output -  Net 780 ml   Filed Weights   05/30/18 1629 05/30/18 2200  Weight: 50 kg 54.1 kg    Examination:  General exam: Appears calm and comfortable  Respiratory system: Clear to auscultation. Respiratory effort normal. Cardiovascular system: S1 & S2 heard, RRR. No JVD, murmurs, rubs, gallops or clicks. No pedal edema. Gastrointestinal system: Abdomen is nondistended, soft and nontender. No organomegaly or masses felt. Normal bowel sounds heard. Central nervous system: Alert and oriented. No focal neurological deficits. Extremities: Symmetric 5 x 5 power. Skin: No rashes, lesions or ulcers Psychiatry: Judgement and insight appear normal. Mood & affect appropriate.     Data Reviewed: I have personally reviewed following labs and imaging studies  CBC: Recent Labs  Lab 05/30/18 1527  06/01/18 0549 06/02/18 0838 06/02/18 1032 06/02/18 2355 06/03/18 0553 06/04/18 0552  WBC 1.6*   < > 1.1* 1.5* 1.6* 1.6* 1.3* 1.8*  NEUTROABS 0.7*  --  0.5* 0.5*  --   --  0.5* 0.7*  HGB 4.6*   < > 7.3* 4.9* 5.2* 8.3* 7.6* 7.8*  HCT 14.6*   < > 22.6* 15.1* 15.3* 26.8* 23.3* 24.3*  MCV  89.0   < > 89.3 89.3 84.5 93.1 87.9 88.0  PLT 26*   < > 20* 124* 124* 106* 86* 78*   < > = values in this interval not displayed.   Basic Metabolic Panel: Recent Labs  Lab 05/31/18 0821 06/01/18 0549 06/02/18 0838 06/03/18 0553 06/04/18 0552  NA 144 143 146* 143 145  K 3.6 3.7 3.4* 3.5 3.5  CL 113* 116* 119* 115* 116*  CO2 22 20* 19* 18* 20*  GLUCOSE 94 106* 105* 82 110*  BUN 44* 28* 28* 20 18  CREATININE 1.06 1.00 1.07 1.14 1.18  CALCIUM 8.3* 8.0* 8.1* 8.2* 8.5*  MG  --   --   --  1.8  --    GFR: Estimated  Creatinine Clearance: 41.4 mL/min (by C-G formula based on SCr of 1.18 mg/dL). Liver Function Tests: Recent Labs  Lab 05/31/18 0816 06/01/18 0549 06/02/18 0838 06/03/18 0553 06/04/18 0552  AST 29 20 18 20 22   ALT 18 15 14 15 14   ALKPHOS 66 59 55 59 68  BILITOT 0.6 0.6 0.6 0.8 0.5  PROT 4.8* 4.4* 4.8* 4.9* 5.0*  ALBUMIN 2.1* 2.0* 2.2* 2.3* 2.3*   No results for input(s): LIPASE, AMYLASE in the last 168 hours. No results for input(s): AMMONIA in the last 168 hours. Coagulation Profile: No results for input(s): INR, PROTIME in the last 168 hours. Cardiac Enzymes: No results for input(s): CKTOTAL, CKMB, CKMBINDEX, TROPONINI in the last 168 hours. BNP (last 3 results) No results for input(s): PROBNP in the last 8760 hours. HbA1C: No results for input(s): HGBA1C in the last 72 hours. CBG: No results for input(s): GLUCAP in the last 168 hours. Lipid Profile: No results for input(s): CHOL, HDL, LDLCALC, TRIG, CHOLHDL, LDLDIRECT in the last 72 hours. Thyroid Function Tests: No results for input(s): TSH, T4TOTAL, FREET4, T3FREE, THYROIDAB in the last 72 hours. Anemia Panel: No results for input(s): VITAMINB12, FOLATE, FERRITIN, TIBC, IRON, RETICCTPCT in the last 72 hours. Sepsis Labs: No results for input(s): PROCALCITON, LATICACIDVEN in the last 168 hours.  No results found for this or any previous visit (from the past 240 hour(s)).       Radiology Studies: No results found.      Scheduled Meds: . feeding supplement (ENSURE ENLIVE)  237 mL Oral BID BM  . pantoprazole  40 mg Oral BID AC   Continuous Infusions:   LOS: 5 days    Time spent: 35 min    Hosie Poisson, MD [facility] Pager [phone] If 7PM-7AM, please contact night-coverage [email or web address] Password [facility] 06/04/2018, 5:09 PM

## 2018-06-04 NOTE — Consult Note (Signed)
Consultation Note Date: 06/04/2018   Patient Name: Joel [name]  DOB: Dec 08, 1940  MRN: [id number]  Age / Sex: 78 y.o., male  PCP: [name], MD Referring Physician: Hosie Poisson, MD  Reason for Consultation: Establishing goals of care  HPI/Patient Profile: 78 y.o. male  with past medical history of mantle cell lymphoma s/p chemotherapy and radiation, castration resistant prostate cancer, and recent admission for GI bleed admitted on 05/30/2018 with Acute GI bleed and pancytopenia.  He underwent EGD and colonoscopy yesterday.  Palliative care consulted for goals of care.   Clinical Assessment and Goals of Care: I met today with Joel [name].   I introduced palliative care as specialized medical care for people living with serious illness. It focuses on providing relief from the symptoms and stress of a serious illness. The goal is to improve quality of life for both the patient and the family.  Values and goals of care important to patient and family were attempted to be elicited.  He reports that the most important things to him are being at home and his family.  He is retired from being a Administrator.  Reports most of the day he just enjoys being home and having coffee by his window.  We discussed clinical course and concern that he has irreversible problem with his low platelets and continued transfusion dependence.  Concepts specific to code status and rehospitalization discussed.  We discussed difference between a aggressive medical intervention path and a palliative, comfort focused care path.   Questions and concerns addressed.   PMT will continue to support holistically.  SUMMARY OF RECOMMENDATIONS   - He is invested in plan to continue with Full Code, Full Scope treatment.  He wishes to discuss care plan further with oncology either prior to discharge or on outpatient follow-up.  Discussed  concern that aggressive interventions in event of cardiac or respiratory arrest carry low likelihood of recovery to a point where returning home and living independently per his stated wishes.  Encouraged him to continue to discuss with his family regarding his wishes moving forward in light of his incurable illness and stated desire to be in his own home.  - Reports that his wife and daughter are his surrogate decision makers.  He has not completed HCPOA paperwork and his wife is his surrogate by default.  Code Status/Advance Care Planning:  Full code  Psycho-social/Spiritual:   Desire for further Chaplaincy support:no  Additional Recommendations: Caregiving  Support/Resources  Prognosis:   Unable to determine with plan for continued aggressive interventions.  He would certainly qualify for hospice services if so desired at any point in the future if he decides to forgo continued transfusions.  Discharge Planning: Home with Home Health      Primary Diagnoses: Present on Admission: . Acute GI bleeding . Pancytopenia ([place]) . 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 Acute renal failure superimposed on stage 3 chronic kidney disease ([place]) . AVM (arteriovenous malformation) of stomach, acquired  I have reviewed the medical record, interviewed the patient and family, and examined the patient. The following aspects are pertinent.  Past Medical History:  Diagnosis Date  . Anemia   . Aneurysm ([place])    in 1987, brain aneurysm  . Bilateral hydronephrosis   . History of radiation therapy 04/17/17- 05/07/17   Right neck treated to 32.5 Gy with 13 fx of 2.5 Gy  . mantle cell lymphoma dx'd 03/2017  . Mass of right side of neck   . Pneumonia    hx of   . Prostate CA ([place]) dx'd 2017  . Urinary retention    Social History   Socioeconomic History  . Marital status: Married    Spouse name: Not on file  . Number of children: Not on file  . Years of  education: Not on file  . Highest education level: Not on file  Occupational History  . Not on file  Social Needs  . Financial resource strain: Not on file  . Food insecurity:    Worry: Not on file    Inability: Not on file  . Transportation needs:    Medical: Not on file    Non-medical: Not on file  Tobacco Use  . Smoking status: Former Smoker    Last attempt to quit: 04/28/1985    Years since quitting: 33.1  . Smokeless tobacco: Former Systems developer    Types: Chew  . Tobacco comment: quit 30-40 yrs. aog  Substance and Sexual Activity  . Alcohol use: No    Alcohol/week: 0.0 standard drinks  . Drug use: No  . Sexual activity: Not on file  Lifestyle  . Physical activity:    Days per week: Not on file    Minutes per session: Not on file  . Stress: Not on file  Relationships  . Social connections:    Talks on phone: Not on file    Gets together: Not on file    Attends religious service: Not on file    Active member of club or organization: Not on file    Attends meetings of clubs or organizations: Not on file    Relationship status: Not on file  Other Topics Concern  . Not on file  Social History Narrative  . Not on file   Family History  Problem Relation Age of Onset  . Dementia Mother   . Heart disease Father   . Lung cancer Brother   . Lupus Sister    Scheduled Meds: . feeding supplement (ENSURE ENLIVE)  237 mL Oral BID BM  . pantoprazole  40 mg Oral BID AC   Continuous Infusions: PRN Meds:.acetaminophen **OR** acetaminophen, ondansetron **OR** ondansetron (ZOFRAN) IV Medications Prior to Admission:  Prior to Admission medications   Medication Sig Start Date End Date Taking? Authorizing Provider  feeding supplement, ENSURE ENLIVE, (ENSURE ENLIVE) LIQD Take 237 mLs by mouth 2 (two) times daily between meals. 03/22/18  Yes Elodia Florence., MD  pantoprazole (PROTONIX) 40 MG tablet Take 1 tablet (40 mg total) by mouth 2 (two) times daily for 30 days. 05/27/18 06/26/18  Yes Dahal, Marlowe Aschoff, MD  megestrol (MEGACE) 400 MG/10ML suspension Take 10 mLs (400 mg total) by mouth 2 (two) times daily. Patient not taking: Reported on 05/30/2018 03/10/18   Wyatt Portela, MD  senna-docusate (SENOKOT-S) 8.6-50 MG tablet Take 1 tablet by mouth 2 (two) times daily for 30 days. Patient not taking: Reported on 05/30/2018 05/27/18 06/26/18  Terrilee Croak, MD   No Known  Allergies Review of Systems  Constitutional: Positive for activity change, appetite change and fatigue.  Neurological: Positive for weakness.  Psychiatric/Behavioral: Positive for sleep disturbance.    Physical Exam  General: Alert, awake, in no acute distress.  HEENT: No bruits, no goiter, no JVD Heart: Regular rate and rhythm. No murmur appreciated. Lungs: Good air movement, clear Abdomen: Soft, nontender, nondistended, positive bowel sounds.  Ext: No significant edema Skin: Warm and dry Neuro: Grossly intact, nonfocal.  Vital Signs: BP (!) 146/88 (BP Location: Left Arm)   Pulse 89   Temp 98.3 F (36.8 C) (Oral)   Resp 16   Ht 5' 7"  (1.702 m)   Wt 54.1 kg   SpO2 98%   BMI 18.67 kg/m  Pain Scale: 0-10   Pain Score: 0-No pain   SpO2: SpO2: 98 % O2 Device:SpO2: 98 % O2 Flow Rate: .O2 Flow Rate (L/min): 3 L/min  IO: Intake/output summary:   Intake/Output Summary (Last 24 hours) at 06/04/2018 1127 Last data filed at 06/04/2018 0600 Gross per 24 hour  Intake 1020 ml  Output -  Net 1020 ml    LBM: Last BM Date: 06/03/18 Baseline Weight: Weight: 50 kg Most recent weight: Weight: 54.1 kg     Palliative Assessment/Data:   Flowsheet Rows     Most Recent Value  Intake Tab  Referral Department  Hospitalist  Unit at Time of Referral  Med/Surg Unit  Palliative Care Primary Diagnosis  Cancer  Date Notified  06/02/18  Palliative Care Type  New Palliative care  Reason for referral  Clarify Goals of Care  Date of Admission  05/30/18  # of days IP prior to Palliative referral  3  Clinical  Assessment  Psychosocial & Spiritual Assessment  Palliative Care Outcomes      Time In: 1120 Time Out: 1230 Time Total: 70 Greater than 50%  of this time was spent counseling and coordinating care related to the above assessment and plan.  Signed by: Micheline Rough, MD   Please contact Palliative Medicine Team phone at [phone] for questions and concerns.  For individual provider: See Shea Evans

## 2018-06-05 LAB — CBC
HCT: 28.9 % — ABNORMAL LOW (ref 39.0–52.0)
Hemoglobin: 9.3 g/dL — ABNORMAL LOW (ref 13.0–17.0)
MCH: 27.6 pg (ref 26.0–34.0)
MCHC: 32.2 g/dL (ref 30.0–36.0)
MCV: 85.8 fL (ref 80.0–100.0)
NRBC: 4.8 % — AB (ref 0.0–0.2)
PLATELETS: 84 10*3/uL — AB (ref 150–400)
RBC: 3.37 MIL/uL — ABNORMAL LOW (ref 4.22–5.81)
RDW: 14.5 % (ref 11.5–15.5)
WBC: 1.9 10*3/uL — AB (ref 4.0–10.5)

## 2018-06-05 LAB — CBC WITH DIFFERENTIAL/PLATELET
ABS IMMATURE GRANULOCYTES: 0.1 10*3/uL — AB (ref 0.00–0.07)
Basophils Absolute: 0 10*3/uL (ref 0.0–0.1)
Basophils Relative: 1 %
Eosinophils Absolute: 0 10*3/uL (ref 0.0–0.5)
Eosinophils Relative: 0 %
HCT: 23 % — ABNORMAL LOW (ref 39.0–52.0)
Hemoglobin: 7.7 g/dL — ABNORMAL LOW (ref 13.0–17.0)
Immature Granulocytes: 7 %
Lymphocytes Relative: 34 %
Lymphs Abs: 0.5 10*3/uL — ABNORMAL LOW (ref 0.7–4.0)
MCH: 28 pg (ref 26.0–34.0)
MCHC: 33.5 g/dL (ref 30.0–36.0)
MCV: 83.6 fL (ref 80.0–100.0)
MONO ABS: 0.3 10*3/uL (ref 0.1–1.0)
MONOS PCT: 19 %
Neutro Abs: 0.6 10*3/uL — ABNORMAL LOW (ref 1.7–7.7)
Neutrophils Relative %: 39 %
Platelets: 51 10*3/uL — ABNORMAL LOW (ref 150–400)
RBC: 2.75 MIL/uL — ABNORMAL LOW (ref 4.22–5.81)
RDW: 14.7 % (ref 11.5–15.5)
WBC: 1.6 10*3/uL — ABNORMAL LOW (ref 4.0–10.5)
nRBC: 7.1 % — ABNORMAL HIGH (ref 0.0–0.2)

## 2018-06-05 LAB — PREPARE RBC (CROSSMATCH)

## 2018-06-05 MED ORDER — SODIUM CHLORIDE 0.9% IV SOLUTION
Freq: Once | INTRAVENOUS | Status: AC
  Administered 2018-06-05: 11:00:00 via INTRAVENOUS

## 2018-06-05 NOTE — Evaluation (Signed)
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 Latest Hbg was 7.7.   Clinical Impression   Pt admitted with the above diagnoses and presents with below problem list. Pt will benefit from continued acute OT to address the below listed deficits and maximize independence with basic ADLs prior to d/c home with spouse. PTA pt was mod I to independent with ADLs. Pt is currently min A for LB ADLs and toilet and tub shower transfers, min guard for household distance functional mobility. Pt's wife and daughter present and involved during session.      Follow Up Recommendations  Home health OT;Supervision/Assistance - 24 hour    Equipment Recommendations  None recommended by OT    Recommendations for Other Services PT consult     Precautions / Restrictions Precautions Precautions: Fall Restrictions Weight Bearing Restrictions: No      Mobility Bed Mobility               General bed mobility comments: up in chair  Transfers Overall transfer level: Needs assistance Equipment used: Rolling walker (2 wheeled) Transfers: Sit to/from Stand Sit to Stand: Min assist         General transfer comment: min A to steady during power up from recliner and to control descent into recliner. Some sequencing difficulty (vs generalized weakness?) noted.    Balance Overall balance assessment: Needs assistance Sitting-balance support: No upper extremity supported Sitting balance-Leahy Scale: Fair     Standing balance support: During functional activity;No upper extremity supported Standing balance-Leahy Scale: Fair Standing balance  comment: reaches for single extremity assist in static standing                           ADL either performed or assessed with clinical judgement   ADL Overall ADL's : Needs assistance/impaired Eating/Feeding: Set up;Sitting Eating/Feeding Details (indicate cue type and reason): family report poor intake Grooming: Min guard;Standing;Wash/dry hands   Upper Body Bathing: Minimal assistance;Sitting   Lower Body Bathing: Minimal assistance;Sit to/from stand   Upper Body Dressing : Minimal assistance;Sitting   Lower Body Dressing: Minimal assistance;Sit to/from stand   Toilet Transfer: Min guard;Minimal assistance;Ambulation;RW   Toileting- Clothing Manipulation and Hygiene: Minimal assistance;Sit to/from stand;Cueing for sequencing;Cueing for safety   Tub/ Shower Transfer: Minimal assistance;Shower seat   Functional mobility during ADLs: Min guard;Cueing for safety;Cueing for sequencing;Rolling walker General ADL Comments: Pt needing min A for sit<>stnad transfers from recliner. Family reports he has had times this admission when he was able to stand up with no assistance. Discussed energy conservation and recommend installing grab bars in shower as pt does not like to use shower seat, "it takes up too much room."     Vision         Perception     Praxis      Pertinent Vitals/Pain Pain Assessment: Faces Faces Pain Scale: Hurts a little bit Pain Location: edema in R>L hands and feet. "feels tight" Pain Descriptors / Indicators: Tightness Pain Intervention(s): Monitored during session;Repositioned;Limited activity within patient's tolerance     Hand Dominance     Extremity/Trunk Assessment Upper Extremity Assessment Upper Extremity Assessment: RUE  deficits/detail;LUE deficits/detail RUE Deficits / Details: edema in R>L hand, wrist and into forearm a bit. Able to complete full ROM. Generalized weakness.  RUE Coordination: decreased fine motor LUE Deficits /  Details: edemous hand. Able to complete full ROM. Generalized weakness.    Lower Extremity Assessment Lower Extremity Assessment: Defer to PT evaluation(edema in R>L feet )   Cervical / Trunk Assessment Cervical / Trunk Assessment: Kyphotic   Communication Communication Communication: No difficulties(low volume)   Cognition Arousal/Alertness: Awake/alert Behavior During Therapy: WFL for tasks assessed/performed Overall Cognitive Status: Impaired/Different from baseline Area of Impairment: Memory;Safety/judgement;Problem solving                     Memory: Decreased short-term memory   Safety/Judgement: Decreased awareness of safety;Decreased awareness of deficits   Problem Solving: Slow processing;Difficulty sequencing General Comments: pt with poor recall of home setup and PLOF with daughter and spouse providing correct information. Some STM deficits and sequencing difficulty noted. Spouse reports, "it's from the chemo."   General Comments  HR 120 at midpoint of household distance functional mobility    Exercises     Shoulder Instructions      Home Living Family/patient expects to be discharged to:: Private residence Living Arrangements: Spouse/significant other Available Help at Discharge: Family;Available 24 hours/day Type of Home: House Home Access: Stairs to enter [employer] of Steps: 3 Entrance Stairs-Rails: None Home Layout: One level     Bathroom Shower/Tub: Teacher, early years/pre: Standard     Home Equipment: Clinical cytogeneticist - 2 wheels          Prior Functioning/Environment Level of Independence: Needs assistance  Gait / Transfers Assistance Needed: occasional use of walker at home. family has been trying to get him to use walker more often.  ADL's / Homemaking Assistance Needed: supervision to mod I with basic ADLs            OT Problem List: Decreased activity tolerance;Decreased strength;Impaired balance  (sitting and/or standing);Decreased cognition;Decreased knowledge of use of DME or AE;Decreased knowledge of precautions;Decreased safety awareness      OT Treatment/Interventions: Self-care/ADL training;DME and/or AE instruction;Therapeutic activities;Balance training;Therapeutic exercise;Energy conservation;Patient/family education    OT Goals(Current goals can be found in the care plan section) Acute Rehab OT Goals Patient Stated Goal: to go home, likes golf OT Goal Formulation: With patient Time For Goal Achievement: 06/19/18 Potential to Achieve Goals: Good ADL Goals Pt Will Perform Grooming: with modified independence;standing Pt Will Perform Lower Body Bathing: with modified independence;sit to/from stand Pt Will Perform Lower Body Dressing: with modified independence;sit to/from stand Pt Will Transfer to Toilet: with modified independence;ambulating Pt Will Perform Toileting - Clothing Manipulation and hygiene: with modified independence;sit to/from stand Pt Will Perform Tub/Shower Transfer: with modified independence;ambulating;grab bars Pt/caregiver will Perform Home Exercise Program: Both right and left upper extremity;Increased strength;With Supervision;With written HEP provided  OT Frequency: Min 2X/week   Barriers to D/C:            Co-evaluation              AM-PAC OT "6 Clicks" Daily Activity     Outcome Measure Help from another person eating meals?: None Help from another person taking care of personal grooming?: A Little Help from another person toileting, which includes using toliet, bedpan, or urinal?: A Little Help from another person bathing (including washing, rinsing, drying)?: A Little Help from another person to put on and taking off regular upper body clothing?: A  Little Help from another person to put on and taking off regular lower body clothing?: A Little 6 Click Score: 19   End of Session Equipment Utilized During Treatment: Rolling  walker;Gait belt  Activity Tolerance: Patient tolerated treatment well Patient left: with call bell/phone within reach;with family/visitor present;with chair alarm set;in chair  OT Visit Diagnosis: Muscle weakness (generalized) (M62.81);Unsteadiness on feet (R26.81)                Time: 0930-1004 OT Time Calculation (min): 34 min Charges:  OT General Charges $OT Visit: 1 Visit OT Evaluation $OT Eval Low Complexity: 1 Low OT Treatments $Self Care/Home Management : 8-22 mins  Tyrone Schimke, OT Acute Rehabilitation Services Pager: [phone] Office: [phone]   Hortencia Pilar 06/05/2018, 11:34 AM

## 2018-06-05 NOTE — Care Management Note (Signed)
Case Management Note  Patient Details  Name: Joel [name] MRN: [id number] Date of Birth: [birth year]  Subjective/Objective:  [facility] already referreed PTA-HHC orders-HHRn/PT/aide-patient/spouse wants to continue w/[facility] rep Santiago Glad aware & following for d/c. No further CM needs.                  Action/Plan:d/c home w/HHC.   Expected Discharge Date:  (unknown)               Expected Discharge Plan:  [place]  In-House Referral:     Discharge planning Services  CM Consult  Post Acute Care Choice:  Durable Medical Equipment(rw;[facility] was referred PTA.) Choice offered to:  Patient, Spouse  DME Arranged:    DME Agency:     HH Arranged:  RN, PT, Nurse's Aide [place] Agency:  [place]  Status of Service:  Completed, signed off  If discussed at [place] of Stay Meetings, dates discussed:    Additional Comments:  Dessa Phi, RN 06/05/2018, 11:11 AM

## 2018-06-06 LAB — BPAM PLATELET PHERESIS
Blood Product Expiration Date: 202002152359
ISSUE DATE / TIME: 202002131515
Unit Type and Rh: 9500

## 2018-06-06 LAB — TYPE AND SCREEN
ABO/RH(D): O POS
Antibody Screen: NEGATIVE
Unit division: 0

## 2018-06-06 LAB — BPAM RBC
Blood Product Expiration Date: 202003092359
ISSUE DATE / TIME: 202002131133
Unit Type and Rh: 5100

## 2018-06-06 LAB — PREPARE PLATELET PHERESIS: Unit division: 0

## 2018-06-08 NOTE — Discharge Summary (Signed)
Physician Discharge Summary  Joel [name] NKN:[id number] DOB: 1941/01/28 DOA: 05/30/2018  PCP: [name], MD  Admit date: 05/30/2018 Discharge date: 06/05/2018  Admitted From: Home  Disposition:  Home.   Recommendations for Outpatient Follow-up:  1. Follow up with PCP in 1-2 weeks 2. Please obtain BMP/CBC in one week Please follow up with gastroenterology as needed.   Discharge Condition:stable.  CODE STATUS: FULL CODE.  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 Pt received multiple prbc transfusions and his H&H remains stable on discharge.   Discharge Diagnoses:  Principal Problem:   Acute GI bleeding Active Problems:   Acute renal failure superimposed on stage 3 chronic kidney disease (HCC)   Mantle cell lymphoma of lymph nodes of head, face, and neck (HCC)   Pancytopenia (HCC)   AVM (arteriovenous malformation) of stomach, acquired  Acute GI bleeding. : S/p IV Protonix infusion and multiple PRBC transfusions and platelet transfusion. S/p EGD and colonoscopy.  She was found to have AVMs which would explain his GI bleed. Appreciate GI recommendations at this time.  Patient's hemoglobin has stabilized at 7.8 and platelets are at 78,000. He received another unit of prbc and platelet transfusion prior to discharge as per oncology recommendations.     Urinary tract infection Completed the course of antibiotic.    Mantle cell carcinoma and prostate: Cancer Dr. Alen Blew saw the patient 2 days ago suggested poor prognosis. Patient's family wants to talk to Dr Alen Blew prior to being discharged. On call Oncologist Dr Maylon Peppers spoke to family prior to discharge.  palliative care consulted and family wants to continue with aggressive  management at this time   Discharge Instructions  Discharge Instructions    Diet - low sodium heart healthy   Complete by:  As directed    Diet - low sodium heart healthy   Complete by:  As directed    Discharge instructions   Complete by:  As directed    Follow up with Dr Alen Blew in one week.  Please follow up with PCP in one week.   Increase activity slowly   Complete by:  As directed      Allergies as of 06/05/2018   No Known Allergies     Medication List    STOP taking these medications   amoxicillin-clavulanate 875-125 MG tablet Commonly known as:  AUGMENTIN     TAKE these medications   feeding supplement (ENSURE ENLIVE) Liqd Take 237 mLs by mouth 2 (two) times daily between meals.   megestrol 400 MG/10ML suspension Commonly known as:  MEGACE Take 10 mLs (400 mg total) by mouth 2 (two) times daily.   pantoprazole 40 MG tablet Commonly known as:  PROTONIX Take 1 tablet (40 mg total) by mouth 2 (two) times daily for 30 days.   senna-docusate 8.6-50 MG tablet Commonly known as:  Senokot-S Take 1 tablet by mouth 2 (two) times daily for 30 days.      Follow-up Information    [facility].   Specialty:  [place] Why:  [facility] nursing/physical therapy/aide Contact information: [place] [address] [phone]        [name], MD. Schedule an appointment as soon as possible for a visit in 1 week(s).  Specialty:  Nephrology Contact information: [place] [address] [phone]        Wyatt Portela, MD. Schedule an appointment as soon as possible for a visit in 1 week(s).   Specialty:  Oncology Contact information: [place] [address] [phone]          No Known Allergies  Consultations:  Palliative care  Gastroenterology.   Oncology    Procedures/Studies: Nm Gi Blood Loss  Result Date: 06/02/2018 CLINICAL DATA:   Anemia with bloody stools.  Undergoing transfusion. EXAM: NUCLEAR MEDICINE GASTROINTESTINAL BLEEDING SCAN TECHNIQUE: Sequential abdominal images were obtained following intravenous administration of Tc-17m labeled red blood cells. RADIOPHARMACEUTICALS:  23.0 mCi Tc-61m pertechnetate in-vitro labeled red cells. COMPARISON:  Abdominopelvic CT 02/18/2018. FINDINGS: Through 2 hours of imaging, no gastrointestinal bleeding is identified. There is a good red cell tag with normal blood pool activity in the liver, spleen, genitalia and vasculature. Progressive bladder activity noted. IMPRESSION: No evidence of active gastrointestinal bleeding. Electronically Signed   By: Richardean Sale M.D.   On: 06/02/2018 17:10   Dg Chest Port 1 View  Result Date: 05/25/2018 CLINICAL DATA:  CHF EXAM: PORTABLE CHEST 1 VIEW COMPARISON:  05/17/2018 FINDINGS: No focal consolidation or frank interstitial edema. Mild scarring in the medial right upper lobe, left perihilar region, and right infrahilar region. No pleural effusion or pneumothorax. The heart is top-normal in size. IMPRESSION: No evidence of acute cardiopulmonary disease. Electronically Signed   By: Julian Hy M.D.   On: 05/25/2018 04:57   Dg Chest Port 1 View  Result Date: 05/17/2018 CLINICAL DATA:  History of prostate cancer, status post resection of a RIGHT neck mass with palliative radiation chemotherapy. Increased lethargy for the past 2 weeks. EXAM: PORTABLE CHEST 1 VIEW COMPARISON:  Chest x-rays dated 05/16/2018 and 03/20/2018. FINDINGS: Heart size and mediastinal contours are grossly stable in size and configuration. Stable mild atelectasis within the LEFT perihilar lung. No new lung findings. No pleural effusion or pneumothorax seen. Osseous structures about the chest are unremarkable. IMPRESSION: No active disease.  No evidence of pneumonia or pulmonary edema. Electronically Signed   By: Franki Cabot M.D.   On: 05/17/2018 09:04   Dg Chest Port 1  View  Result Date: 05/16/2018 CLINICAL DATA:  Low hemoglobin. Lymphoma. Nausea vomiting. Frequent transfusions. EXAM: PORTABLE CHEST 1 VIEW COMPARISON:  Chest x-ray 03/20/2018, 07/07/2015.  PET-CT 04/25/2017. FINDINGS: Mediastinum hilar structures normal. Left perihilar atelectasis. No focal alveolar infiltrate. Stable mild bilateral pleural thickening consistent scarring. No pneumothorax. Cardiomegaly with normal pulmonary vascularity. Degenerative change thoracic spine. IMPRESSION: 1.  Left perihilar atelectasis. 2.  Cardiomegaly.  No pulmonary venous congestion. Electronically Signed   By: Marcello Moores  Register   On: 05/16/2018 14:41       Subjective: No chest pain or  sob.   Discharge Exam: Vitals:   06/05/18 1535 06/05/18 1648  BP: (!) 150/86 (!) 166/92  Pulse: 86 100  Resp: 18 18  Temp: 98.2 F (36.8 C) 98.9 F (37.2 C)  SpO2: 100% 100%   Vitals:   06/05/18 1417 06/05/18 1508 06/05/18 1535 06/05/18 1648  BP: (!) 140/93 (!) 147/93 (!) 150/86 (!) 166/92  Pulse: (!) 109 91 86 100  Resp: 18 20 18 18   Temp: 99 F (37.2 C) 98.6 F (37 C) 98.2 F (36.8 C) 98.9 F (37.2 C)  TempSrc: Oral Oral Oral Oral  SpO2: 100% 99% 100% 100%  Weight:      Height:  General: Pt is alert, awake, not in acute distress Cardiovascular: RRR, S1/S2 +, no rubs, no gallops Respiratory: CTA bilaterally, no wheezing, no rhonchi Abdominal: Soft, NT, ND, bowel sounds + Extremities: no edema, no cyanosis    The results of significant diagnostics from this hospitalization (including imaging, microbiology, ancillary and laboratory) are listed below for reference.     Microbiology: No results found for this or any previous visit (from the past 240 hour(s)).   Labs: BNP (last 3 results) No results for input(s): BNP in the last 8760 hours. Basic Metabolic Panel: Recent Labs  Lab 06/02/18 0838 06/03/18 0553 06/04/18 0552  NA 146* 143 145  K 3.4* 3.5 3.5  CL 119* 115* 116*  CO2 19* 18* 20*   GLUCOSE 105* 82 110*  BUN 28* 20 18  CREATININE 1.07 1.14 1.18  CALCIUM 8.1* 8.2* 8.5*  MG  --  1.8  --    Liver Function Tests: Recent Labs  Lab 06/02/18 0838 06/03/18 0553 06/04/18 0552  AST 18 20 22   ALT 14 15 14   ALKPHOS 55 59 68  BILITOT 0.6 0.8 0.5  PROT 4.8* 4.9* 5.0*  ALBUMIN 2.2* 2.3* 2.3*   No results for input(s): LIPASE, AMYLASE in the last 168 hours. No results for input(s): AMMONIA in the last 168 hours. CBC: Recent Labs  Lab 06/02/18 0838  06/02/18 2355 06/03/18 0553 06/04/18 0552 06/05/18 0851 06/05/18 1816  WBC 1.5*   < > 1.6* 1.3* 1.8* 1.6* 1.9*  NEUTROABS 0.5*  --   --  0.5* 0.7* 0.6*  --   HGB 4.9*   < > 8.3* 7.6* 7.8* 7.7* 9.3*  HCT 15.1*   < > 26.8* 23.3* 24.3* 23.0* 28.9*  MCV 89.3   < > 93.1 87.9 88.0 83.6 85.8  PLT 124*   < > 106* 86* 78* 51* 84*   < > = values in this interval not displayed.   Cardiac Enzymes: No results for input(s): CKTOTAL, CKMB, CKMBINDEX, TROPONINI in the last 168 hours. BNP: Invalid input(s): POCBNP CBG: No results for input(s): GLUCAP in the last 168 hours. D-Dimer No results for input(s): DDIMER in the last 72 hours. Hgb A1c No results for input(s): HGBA1C in the last 72 hours. Lipid Profile No results for input(s): CHOL, HDL, LDLCALC, TRIG, CHOLHDL, LDLDIRECT in the last 72 hours. Thyroid function studies No results for input(s): TSH, T4TOTAL, T3FREE, THYROIDAB in the last 72 hours.  Invalid input(s): FREET3 Anemia work up No results for input(s): VITAMINB12, FOLATE, FERRITIN, TIBC, IRON, RETICCTPCT in the last 72 hours. Urinalysis    Component Value Date/Time   COLORURINE YELLOW 05/17/2018 0757   APPEARANCEUR HAZY (A) 05/17/2018 0757   LABSPEC 1.013 05/17/2018 0757   PHURINE 6.0 05/17/2018 [place] 05/17/2018 0757   HGBUR NEGATIVE 05/17/2018 0757   BILIRUBINUR NEGATIVE 05/17/2018 0757   BILIRUBINUR negative 07/05/2015 [place] 05/17/2018 0757   PROTEINUR NEGATIVE  05/17/2018 0757   UROBILINOGEN 0.2 07/05/2015 1801   NITRITE NEGATIVE 05/17/2018 0757   LEUKOCYTESUR LARGE (A) 05/17/2018 0757   Sepsis Labs Invalid input(s): PROCALCITONIN,  WBC,  LACTICIDVEN Microbiology No results found for this or any previous visit (from the past 240 hour(s)).   Time coordinating discharge: 34  minutes  SIGNED:   Hosie Poisson, MD  [facility] 06/08/2018, 5:22 PM Pager   If 7PM-7AM, please contact night-coverage [email or web address] Password [email or web address]

## 2018-06-10 ENCOUNTER — Encounter: Payer: Self-pay | Admitting: Diagnostic Neuroimaging

## 2018-06-10 ENCOUNTER — Telehealth: Payer: Self-pay | Admitting: Oncology

## 2018-06-10 ENCOUNTER — Ambulatory Visit: Payer: Medicare HMO | Admitting: Diagnostic Neuroimaging

## 2018-06-10 VITALS — BP 110/88 | HR 84 | Ht 67.0 in

## 2018-06-10 DIAGNOSIS — R413 Other amnesia: Secondary | ICD-10-CM

## 2018-06-10 NOTE — Telephone Encounter (Signed)
Scheduled appt per 2/18 sch message - pt is aware of appt date and time   

## 2018-06-10 NOTE — Patient Instructions (Addendum)
  MEMORY LOSS - safety / supervision issues reviewed - caregiver resources provided - no driving; caution with finances and medications

## 2018-06-10 NOTE — Progress Notes (Signed)
[facility]  PATIENT: Joel [name] DOB: Aug 26, 1940  REFERRING CLINICIAN: F Shadad HISTORY FROM: patient  REASON FOR VISIT: new consult    HISTORICAL  CHIEF COMPLAINT:  Chief Complaint  Patient presents with  . Memory changes    rm 6, New Pt, wife- Sallie MMSE 15    HISTORY OF PRESENT ILLNESS:   78 year old male here for evaluation of memory loss.  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  Patient has had complications related to underlying medical and oncology conditions over the last year.  In November 2018 after hospitalization for transfusion patient was confused at home.  This was noted by family.  He was not able to tell where the alarm system was in the home.  He was saying some other things at home that concerned the family.  The symptoms gradually improved over time.  Now patient having some mild forgetfulness but symptoms have improved.  Patient has had some problems managing his finances and now family is taking over.  Family is also helping with other issues like driving and transferring.    REVIEW OF SYSTEMS: Full 14 system review of systems performed and negative with exception of: Fevers chills fatigue anemia enlarged lymph nodes slurred speech passing out weakness restless legs memory loss confusion anxiety too much sleep.   ALLERGIES: No Known Allergies  HOME MEDICATIONS: Outpatient Medications Prior to Visit  Medication Sig Dispense Refill  . feeding supplement, ENSURE ENLIVE, (ENSURE ENLIVE) LIQD Take 237 mLs by mouth 2 (two) times daily between meals. 237 mL 12  . megestrol (MEGACE) 400 MG/10ML suspension Take 10 mLs (400 mg total) by mouth 2 (two) times daily. (Patient not taking: Reported on 05/30/2018) 240 mL 1  . pantoprazole (PROTONIX) 40 MG tablet Take 1 tablet (40 mg total) by mouth 2 (two) times daily for 30 days. (Patient not taking: Reported on 06/10/2018) 60  tablet 0  . senna-docusate (SENOKOT-S) 8.6-50 MG tablet Take 1 tablet by mouth 2 (two) times daily for 30 days. (Patient not taking: Reported on 05/30/2018) 60 tablet 0   No facility-administered medications prior to visit.     PAST MEDICAL HISTORY: Past Medical History:  Diagnosis Date  . Anemia   . Aneurysm ([place])    in 1987, brain aneurysm  . Bilateral hydronephrosis   . History of radiation therapy 04/17/17- 05/07/17   Right neck treated to 32.5 Gy with 13 fx of 2.5 Gy  . mantle cell lymphoma dx'd 03/2017  . Mass of right side of neck   . Pneumonia    hx of   . Prostate CA ([place]) dx'd 2017  . Urinary retention     PAST SURGICAL HISTORY: Past Surgical History:  Procedure Laterality Date  . BIOPSY  05/21/2018   Procedure: BIOPSY;  Surgeon: Milus Banister, MD;  Location: [facility];  Service: Gastroenterology;;  . COLONOSCOPY WITH PROPOFOL N/A 06/03/2018   Procedure: COLONOSCOPY WITH PROPOFOL;  Surgeon: Ronnette Juniper, MD;  Location: [facility];  Service: Gastroenterology;  Laterality: N/A;  . ESOPHAGOGASTRODUODENOSCOPY (EGD) WITH PROPOFOL N/A 05/21/2018   Procedure: ESOPHAGOGASTRODUODENOSCOPY (EGD) WITH PROPOFOL;  Surgeon: Milus Banister, MD;  Location: [facility];  Service: Gastroenterology;  Laterality: N/A;  . ESOPHAGOGASTRODUODENOSCOPY (EGD) WITH PROPOFOL N/A 06/03/2018   Procedure: ESOPHAGOGASTRODUODENOSCOPY (EGD) WITH PROPOFOL;  Surgeon: Ronnette Juniper, MD;  Location: [facility];  Service: Gastroenterology;  Laterality: N/A;  . HOT HEMOSTASIS N/A 05/21/2018   Procedure: HOT HEMOSTASIS (ARGON PLASMA  COAGULATION/BICAP);  Surgeon: Milus Banister, MD;  Location: [facility];  Service: Gastroenterology;  Laterality: N/A;  . HOT HEMOSTASIS N/A 06/03/2018   Procedure: HOT HEMOSTASIS (ARGON PLASMA COAGULATION/BICAP);  Surgeon: Ronnette Juniper, MD;  Location: [facility];  Service: Gastroenterology;  Laterality: N/A;  . MASS BIOPSY Right 03/27/2017   Procedure: OPEN NECK MASS BIOPSY  OF THE RIGHT NECK;  Surgeon: Leta Baptist, MD;  Location: [facility];  Service: ENT;  Laterality: Right;  . POLYPECTOMY  06/03/2018   Procedure: POLYPECTOMY;  Surgeon: Ronnette Juniper, MD;  Location: [facility];  Service: Gastroenterology;;  . PROSTATE BIOPSY N/A 07/30/2015   Procedure: PROSTATE BIOPSY;  Surgeon: Irine Seal, MD;  Location: [facility];  Service: Urology;  Laterality: N/A;  . TRANSURETHRAL RESECTION OF PROSTATE N/A 07/30/2015   Procedure: TRANSURETHRAL RESECTION OF THE PROSTATE (TURP);  Surgeon: Irine Seal, MD;  Location: [facility];  Service: Urology;  Laterality: N/A;    FAMILY HISTORY: Family History  Problem Relation Age of Onset  . Dementia Mother   . Heart disease Father   . Lung cancer Brother   . Lupus Sister     SOCIAL HISTORY: Social History   Socioeconomic History  . Marital status: Married    Spouse name: Sallie  . Number of children: 1  . Years of education: 33  . Highest education level: Associate degree: occupational, Hotel manager, or vocational program  Occupational History    Comment: retired  Scientific laboratory technician  . Financial resource strain: Not on file  . Food insecurity:    Worry: Not on file    Inability: Not on file  . Transportation needs:    Medical: Not on file    Non-medical: Not on file  Tobacco Use  . Smoking status: Former Smoker    Last attempt to quit: 04/28/1985    Years since quitting: 33.1  . Smokeless tobacco: Former Systems developer    Types: Chew  . Tobacco comment: quit 30-40 yrs. aog  Substance and Sexual Activity  . Alcohol use: No    Alcohol/week: 0.0 standard drinks  . Drug use: No  . Sexual activity: Not on file  Lifestyle  . Physical activity:    Days per week: Not on file    Minutes per session: Not on file  . Stress: Not on file  Relationships  . Social connections:    Talks on phone: Not on file    Gets together: Not on file    Attends religious service: Not on file    Active member of club or organization: Not on file    Attends meetings of  clubs or organizations: Not on file    Relationship status: Not on file  . Intimate partner violence:    Fear of current or ex partner: Not on file    Emotionally abused: Not on file    Physically abused: Not on file    Forced sexual activity: Not on file  Other Topics Concern  . Not on file  Social History Narrative   Lives with wife   Caffeine- not much     PHYSICAL EXAM  GENERAL EXAM/CONSTITUTIONAL: Vitals:  Vitals:   06/10/18 1338  BP: 110/88  Pulse: 84  Height: 5\' 7"  (1.702 m)     Body mass index is 18.67 kg/m. Wt Readings from Last 3 Encounters:  05/30/18 119 lb 3.2 oz (54.1 kg)  05/21/18 110 lb 3.7 oz (50 kg)  05/05/18 125 lb 3 oz (56.8 kg)     FRAIL APPEARING  Patient is in no distress; well developed, nourished and groomed; neck is supple  CARDIOVASCULAR:  Examination of carotid arteries is normal; no carotid bruits  Regular rate and rhythm, no murmurs  Examination of peripheral vascular system by observation and palpation is normal  EYES:  Ophthalmoscopic exam of optic discs and posterior segments is normal; no papilledema or hemorrhages  No exam data present  MUSCULOSKELETAL:  Gait, strength, tone, movements noted in Neurologic exam below  NEUROLOGIC: MENTAL STATUS:  MMSE - Mini Mental State Exam 06/10/2018  Orientation to time 0  Orientation to Place 3  Registration 3  Attention/ Calculation 0  Recall 1  Language- name 2 objects 2  Language- repeat 1  Language- follow 3 step command 3  Language- read & follow direction 1  Write a sentence 0  Copy design 0  Total score 14    awake, alert, oriented to person, place and time  recent and remote memory intact  normal attention and concentration  language fluent, comprehension intact, naming intact  fund of knowledge appropriate  CRANIAL NERVE:   2nd - no papilledema on fundoscopic exam  2nd, 3rd, 4th, 6th - pupils equal and reactive to light, visual fields full to  confrontation, extraocular muscles intact, no nystagmus  5th - facial sensation symmetric  7th - facial strength symmetric  8th - hearing intact  9th - palate elevates symmetrically, uvula midline  11th - shoulder shrug symmetric  12th - tongue protrusion midline  MOTOR:   normal bulk and tone  BUE 3-4; BLE 2-3  SENSORY:   normal and symmetric to light touch  COORDINATION:   finger-nose-finger, fine finger movements SLOW  REFLEXES:   deep tendon reflexes TRACE and symmetric  GAIT/STATION:   IN WHEEL CHAIR; CANNOT STAND UNASSISTED     DIAGNOSTIC DATA (LABS, IMAGING, TESTING) - I reviewed patient records, labs, notes, testing and imaging myself where available.  Lab Results  Component Value Date   WBC 1.9 (L) 06/05/2018   HGB 9.3 (L) 06/05/2018   HCT 28.9 (L) 06/05/2018   MCV 85.8 06/05/2018   PLT 84 (L) 06/05/2018      Component Value Date/Time   NA 145 06/04/2018 0552   NA 141 04/04/2017 1030   K 3.5 06/04/2018 0552   K 4.4 04/04/2017 1030   CL 116 (H) 06/04/2018 0552   CO2 20 (L) 06/04/2018 0552   CO2 20 (L) 04/04/2017 1030   GLUCOSE 110 (H) 06/04/2018 0552   GLUCOSE 97 04/04/2017 1030   BUN 18 06/04/2018 0552   BUN 29.2 (H) 04/04/2017 1030   CREATININE 1.18 06/04/2018 0552   CREATININE 1.32 (H) 05/16/2018 1131   CREATININE 1.7 (H) 04/04/2017 1030   CALCIUM 8.5 (L) 06/04/2018 0552   CALCIUM 8.8 04/04/2017 1030   PROT 5.0 (L) 06/04/2018 0552   PROT 7.7 04/12/2017 1423   PROT 8.0 04/04/2017 1030   ALBUMIN 2.3 (L) 06/04/2018 0552   ALBUMIN 3.2 (L) 04/04/2017 1030   AST 22 06/04/2018 0552   AST 17 05/16/2018 1131   AST 31 04/04/2017 1030   ALT 14 06/04/2018 0552   ALT 15 05/16/2018 1131   ALT 10 04/04/2017 1030   ALKPHOS 68 06/04/2018 0552   ALKPHOS 101 04/04/2017 1030   BILITOT 0.5 06/04/2018 0552   BILITOT 0.4 05/16/2018 1131   BILITOT 0.56 04/04/2017 1030   GFRNONAA >60 06/04/2018 0552   GFRNONAA 52 (L) 05/16/2018 1131   GFRNONAA 6  (L) 07/05/2015 1745   GFRAA >60 06/04/2018  [place] 05/16/2018 1131   GFRAA 7 (L) 07/05/2015 1745   Lab Results  Component Value Date   CHOL 162 07/08/2015   HDL 76 07/08/2015   LDLCALC 78 07/08/2015   TRIG 40 07/08/2015   CHOLHDL 2.1 07/08/2015   Lab Results  Component Value Date   HGBA1C 6.1 (H) 07/08/2015   Lab Results  Component Value Date   VITAMINB12 279 07/29/2015   Lab Results  Component Value Date   TSH 1.990 05/17/2018     04/20/17 MRI brain [I reviewed images myself and agree with interpretation. MODERATE VENTRICULOMEGALY. -VRP]  1. No evidence of intracranial lymphoma. 2. 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 Moderate chronic microvascular ischemic changes and moderate parenchymal volume loss of the brain.    ASSESSMENT AND PLAN  78 y.o. year old male here with mantle cell lymphoma, advanced prostate cancer, GI bleeding, here for evaluation of memory loss.  Patient has had several episodes of confusion in the setting of underlying medical issues and treatment.  Also with MMSE of 15 out of 30 and decline in ADLs.  MRI of the brain shows generalized atrophy and prior infarcts.  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  Dx:  1. Memory loss     PLAN:  MEMORY LOSS - consider MRI brain; family will let us know if they want to proceed - safety / supervision issues reviewed - caregiver resources provided - no driving; caution with finances and medications  Return for return to PCP, pending if symptoms worsen or fail to improve.    Penni Bombard, MD 5/88/3254, 9:82 PM Certified in Neurology, Neurophysiology and Neuroimaging  [facility] [address], [place], [place] [address] [phone]

## 2018-06-11 ENCOUNTER — Inpatient Hospital Stay: Payer: Medicare HMO

## 2018-06-11 ENCOUNTER — Inpatient Hospital Stay (HOSPITAL_BASED_OUTPATIENT_CLINIC_OR_DEPARTMENT_OTHER): Payer: Medicare HMO | Admitting: Oncology

## 2018-06-11 ENCOUNTER — Other Ambulatory Visit: Payer: Self-pay

## 2018-06-11 DIAGNOSIS — C8311 Mantle cell lymphoma, lymph nodes of head, face, and neck: Secondary | ICD-10-CM | POA: Diagnosis not present

## 2018-06-11 DIAGNOSIS — D6489 Other specified anemias: Secondary | ICD-10-CM

## 2018-06-11 DIAGNOSIS — D649 Anemia, unspecified: Secondary | ICD-10-CM

## 2018-06-11 DIAGNOSIS — C61 Malignant neoplasm of prostate: Secondary | ICD-10-CM | POA: Insufficient documentation

## 2018-06-11 DIAGNOSIS — Z79899 Other long term (current) drug therapy: Secondary | ICD-10-CM | POA: Insufficient documentation

## 2018-06-11 DIAGNOSIS — D696 Thrombocytopenia, unspecified: Secondary | ICD-10-CM | POA: Diagnosis not present

## 2018-06-11 DIAGNOSIS — C8310 Mantle cell lymphoma, unspecified site: Secondary | ICD-10-CM | POA: Diagnosis present

## 2018-06-11 LAB — CBC WITH DIFFERENTIAL (CANCER CENTER ONLY)
Abs Immature Granulocytes: 0.16 10*3/uL — ABNORMAL HIGH (ref 0.00–0.07)
Basophils Absolute: 0 10*3/uL (ref 0.0–0.1)
Basophils Relative: 1 %
Eosinophils Absolute: 0 10*3/uL (ref 0.0–0.5)
Eosinophils Relative: 0 %
HCT: 21 % — ABNORMAL LOW (ref 39.0–52.0)
Hemoglobin: 6.9 g/dL — CL (ref 13.0–17.0)
Immature Granulocytes: 8 %
Lymphocytes Relative: 40 %
Lymphs Abs: 0.8 10*3/uL (ref 0.7–4.0)
MCH: 27.8 pg (ref 26.0–34.0)
MCHC: 32.9 g/dL (ref 30.0–36.0)
MCV: 84.7 fL (ref 80.0–100.0)
Monocytes Absolute: 0.2 10*3/uL (ref 0.1–1.0)
Monocytes Relative: 12 %
Neutro Abs: 0.8 10*3/uL — ABNORMAL LOW (ref 1.7–7.7)
Neutrophils Relative %: 39 %
Platelet Count: 17 10*3/uL — ABNORMAL LOW (ref 150–400)
RBC: 2.48 MIL/uL — AB (ref 4.22–5.81)
RDW: 14.4 % (ref 11.5–15.5)
WBC Count: 1.9 10*3/uL — ABNORMAL LOW (ref 4.0–10.5)
nRBC: 2.1 % — ABNORMAL HIGH (ref 0.0–0.2)

## 2018-06-11 LAB — PREPARE RBC (CROSSMATCH)

## 2018-06-11 LAB — SAMPLE TO BLOOD BANK

## 2018-06-11 MED ORDER — ACETAMINOPHEN 325 MG PO TABS
650.0000 mg | ORAL_TABLET | Freq: Once | ORAL | Status: AC
  Administered 2018-06-11: 650 mg via ORAL

## 2018-06-11 MED ORDER — DIPHENHYDRAMINE HCL 25 MG PO CAPS
25.0000 mg | ORAL_CAPSULE | Freq: Once | ORAL | Status: AC
  Administered 2018-06-11: 25 mg via ORAL

## 2018-06-11 MED ORDER — ACETAMINOPHEN 325 MG PO TABS
ORAL_TABLET | ORAL | Status: AC
  Filled 2018-06-11: qty 2

## 2018-06-11 MED ORDER — DIPHENHYDRAMINE HCL 25 MG PO CAPS
ORAL_CAPSULE | ORAL | Status: AC
  Filled 2018-06-11: qty 1

## 2018-06-11 MED ORDER — SODIUM CHLORIDE 0.9% IV SOLUTION
250.0000 mL | Freq: Once | INTRAVENOUS | Status: AC
  Administered 2018-06-11: 250 mL via INTRAVENOUS
  Filled 2018-06-11: qty 250

## 2018-06-11 NOTE — Progress Notes (Signed)
Hematology and Oncology Follow Up Visit  Joel [name] [id number] 15-May-1940 78 y.o. 06/11/2018 12:08 PM [name], MDSanford, [name], MD   Principle Diagnosis: 78 year old man with:  1.  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    2.  Mantle cell lymphoma diagnosed in 2018.  He presented with stage IIIA with diffuse adenopathy. Prior Therapy:   He is status post excisional biopsy of a right neck mass completed on 03/27/2017 with the pathology confirming mantle cell lymphoma.  He is status post palliative radiation therapy to his cervical adenopathy completed in January 2019.   Imbruvica 560 mg daily in December 2018.  Therapy has been on hold because of pancytopenia since October 2019.   Current therapy:   Lupron 30 mg once every 4 months since January 2019.  Last injection given on February 18, 2018.   Interim History: Joel [name] returns today for repeat evaluation.  Since the last visit, he has required recurrent hospitalization for GI bleeding and severe pancytopenia.  During his hospitalization he did require multiple blood products including platelets and packed red cells.  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  Since his recent discharge he continues to be rather puny and weak overall.  He denies any active bleeding and all his bowel habits has been overall clear.  He denies any fevers or chills.  His appetite has been marginal.  His performance status continues to decline.   Patient denied any alteration mental status, neuropathy, confusion or dizziness.  Denies any headaches or lethargy.  Denies any night sweats, weight loss or changes in appetite.  Denied orthopnea, dyspnea on exertion or chest discomfort.  Denies shortness of breath, difficulty breathing hemoptysis or cough.  Denies any abdominal distention, nausea, early satiety or dyspepsia.  Denies any hematuria, frequency,  dysuria or nocturia.  Denies any skin irritation, dryness or rash.  Denies any ecchymosis or petechiae.  Denies any lymphadenopathy or clotting.  Denies any heat or cold intolerance.  Denies any anxiety or depression.  Remaining review of system is negative.    Medications: I have reviewed the patient's current medications.  Current Outpatient Medications  Medication Sig Dispense Refill  . feeding supplement, ENSURE ENLIVE, (ENSURE ENLIVE) LIQD Take 237 mLs by mouth 2 (two) times daily between meals. 237 mL 12  . megestrol (MEGACE) 400 MG/10ML suspension Take 10 mLs (400 mg total) by mouth 2 (two) times daily. (Patient not taking: Reported on 05/30/2018) 240 mL 1  . pantoprazole (PROTONIX) 40 MG tablet Take 1 tablet (40 mg total) by mouth 2 (two) times daily for 30 days. (Patient not taking: Reported on 06/10/2018) 60 tablet 0  . senna-docusate (SENOKOT-S) 8.6-50 MG tablet Take 1 tablet by mouth 2 (two) times daily for 30 days. (Patient not taking: Reported on 05/30/2018) 60 tablet 0   No current facility-administered medications for this visit.      Allergies: No Known Allergies  Past Medical History, Surgical history, Social history, and Family History updated without any changes.  Physical Exam:      ECOG:2      General appearance:  Frail-appearing without any distress today. Head:  No abnormalities noted.  No masses or lesions. Oropharynx: No ulcers or masses. Eyes: Pupils are equal and round reactive to light. Lymph nodes: No cervical, supraclavicular, or axillary lymphadenopathy. Heart:regular rate and rhythm, without any murmurs or gallops.    No leg edema. Lung:  Clear to auscultation in all lung fields.  No rhonchi or wheezes. Abdomin: Soft, nontender with good bowel sounds. Musculoskeletal: No deformity or effusion. Neurological: No deficits noted. Skin: No ecchymosis or petechiae. Psychiatric: Mood and affect appears appropriate.       Lab Results: Lab Results   Component Value Date   WBC 1.9 (L) 06/11/2018   HGB 6.9 (LL) 06/11/2018   HCT 21.0 (L) 06/11/2018   MCV 84.7 06/11/2018   PLT 17 (L) 06/11/2018     Chemistry      Component Value Date/Time   NA 145 06/04/2018 0552   NA 141 04/04/2017 1030   K 3.5 06/04/2018 0552   K 4.4 04/04/2017 1030   CL 116 (H) 06/04/2018 0552   CO2 20 (L) 06/04/2018 0552   CO2 20 (L) 04/04/2017 1030   BUN 18 06/04/2018 0552   BUN 29.2 (H) 04/04/2017 1030   CREATININE 1.18 06/04/2018 0552   CREATININE 1.32 (H) 05/16/2018 1131   CREATININE 1.7 (H) 04/04/2017 1030      Component Value Date/Time   CALCIUM 8.5 (L) 06/04/2018 0552   CALCIUM 8.8 04/04/2017 1030   ALKPHOS 68 06/04/2018 0552   ALKPHOS 101 04/04/2017 1030   AST 22 06/04/2018 0552   AST 17 05/16/2018 1131   AST 31 04/04/2017 1030   ALT 14 06/04/2018 0552   ALT 15 05/16/2018 1131   ALT 10 04/04/2017 1030   BILITOT 0.5 06/04/2018 0552   BILITOT 0.4 05/16/2018 1131   BILITOT 0.56 04/04/2017 1030        Impression and Plan:  78 year old man with:  1. Mantle cell lymphoma diagnosed in December 2018 remains off treatment at this time because of his continuous bone marrow failure.  He has been on ibrutinib in the past and currently not a candidate for any additional therapy given his overall decline.  2.    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  He will continue to receive Lupron every 4 months.  He will receive the next one in March 2020.  3.    Pancytopenia: Remains of unclear etiology.  His bone marrow biopsy showed fibrosis which could be secondary related to his mantle cell lymphoma and possibly ibrutinib.  Marrow fibrosis is a myeloproliferative disorder could also be a possibility.  I had a long discussion today with the patient and his wife about treatment options.  At this time, I have recommended continued supportive transfusion.  I see no definitive treatment at this time to help his counts moving  forward.  4.  Thrombocytopenia: He will receive platelet transfusion today.  The goal is to keep his platelets close to 50,000 to prevent any further GI bleed.  5.  Anemia: Related to his marrow failure as well as recent GI bleed.  He will receive 2 units of packed red cells.  6.  Prognosis: Poor with limited life expectancy.  I would like to transition to hospice care in the near future.  At this time he continues to desire full support with transfusions but I will continue to address that with him at this time.   7.  Follow-up: Will be weekly for labs and transfusion as needed.  25 minutes was spent with the patient face-to-face today.  More than 50% of time was dedicated to discussing his disease status, reviewing laboratory data and answering questions regarding future plan of care.      Zola Button, MD 2/19/202012:08 PM

## 2018-06-11 NOTE — Patient Instructions (Addendum)
Blood Transfusion, Adult, Care After This sheet gives you information about how to care for yourself after your procedure. Your doctor may also give you more specific instructions. If you have problems or questions, contact your doctor. Follow these instructions at home:   Take over-the-counter and prescription medicines only as told by your doctor.  Go back to your normal activities as told by your doctor.  Follow instructions from your doctor about how to take care of the area where an IV tube was put into your vein (insertion site). Make sure you: ? Wash your hands with soap and water before you change your bandage (dressing). If there is no soap and water, use hand sanitizer. ? Change your bandage as told by your doctor.  Check your IV insertion site every day for signs of infection. Check for: ? More redness, swelling, or pain. ? More fluid or blood. ? Warmth. ? Pus or a bad smell. Contact a doctor if:  You have more redness, swelling, or pain around the IV insertion site.  You have more fluid or blood coming from the IV insertion site.  Your IV insertion site feels warm to the touch.  You have pus or a bad smell coming from the IV insertion site.  Your pee (urine) turns pink, red, or brown.  You feel weak after doing your normal activities. Get help right away if:  You have signs of a serious allergic or body defense (immune) system reaction, including: ? Itchiness. ? Hives. ? Trouble breathing. ? Anxiety. ? Pain in your chest or lower back. ? Fever, flushing, and chills. ? Fast pulse. ? Rash. ? Watery poop (diarrhea). ? Throwing up (vomiting). ? Dark pee. ? Serious headache. ? Dizziness. ? Stiff neck. ? Yellow color in your face or the white parts of your eyes (jaundice). Summary  After a blood transfusion, return to your normal activities as told by your doctor.  Every day, check for signs of infection where the IV tube was put into your vein.  Some  signs of infection are warm skin, more redness and pain, more fluid or blood, and pus or a bad smell where the needle went in.  Contact your doctor if you feel weak or have any unusual symptoms. This information is not intended to replace advice given to you by your health care provider. Make sure you discuss any questions you have with your health care provider. Document Released: 04/30/2014 Document Revised: 12/02/2015 Document Reviewed: 12/02/2015 Elsevier Interactive Patient Education  2019 [employer]    Platelet Transfusion A platelet transfusion is a procedure in which you receive donated platelets through an IV. Platelets are tiny pieces of blood cells. When you get an injury, platelets clump together in the area to form a blood clot. This helps stop bleeding and is the beginning of the healing process. If you have too few platelets, your blood may have trouble clotting. This may cause you to bleed and bruise very easily. You may need a platelet transfusion if you have a condition that causes a low number of platelets (thrombocytopenia). A platelet transfusion may be used to stop or prevent excessive bleeding. Tell a health care provider about:  Any reactions you have had during previous transfusions.  Any allergies you have.  All medicines you are taking, including vitamins, herbs, eye drops, creams, and over-the-counter medicines.  Any blood disorders you have.  Any surgeries you have had.  Any medical conditions you have.  Whether you are pregnant or   may be pregnant. What are the risks? Generally, this is a safe procedure. However, problems may occur, including:  Fever.  Infection.  Allergic reaction to the donor platelets.  Your body's disease-fighting system (immune system) attacking the donor platelets (hemolytic reaction). This is rare.  A rare reaction that causes lung damage (transfusion-related acute lung injury). What happens before the  procedure? Medicines  Ask your health care provider about: ? Changing or stopping your regular medicines. This is especially important if you are taking diabetes medicines or blood thinners. ? Taking medicines such as aspirin and ibuprofen. These medicines can thin your blood. Do not take these medicines unless your health care provider tells you to take them. ? Taking over-the-counter medicines, vitamins, herbs, and supplements. General instructions  You will have a blood test to determine your blood type. Your blood type determines what kind of platelets you will be given.  Follow instructions from your health care provider about eating or drinking restrictions.  If you have had an allergic reaction to a transfusion in the past, you may be given medicine to help prevent a reaction.  Your temperature, blood pressure, pulse, and breathing will be monitored. What happens during the procedure?   An IV will be inserted into one of your veins.  For your safety, two health care providers will verify your identity along with the donor platelets about to be infused.  A bag of donor platelets will be connected to your IV. The platelets will flow into your bloodstream. This usually takes 30-60 minutes.  Your temperature, blood pressure, pulse, and breathing will be monitored during the transfusion. This helps detect early signs of any reaction.  You will also be monitored for other symptoms that may indicate a reaction, including chills, hives, or itching.  If you have signs of a reaction at any time, your transfusion will be stopped, and you may be given medicine to help manage the reaction.  When your transfusion is complete, your IV will be removed.  Pressure may be applied to the IV site for a few minutes to stop any bleeding.  The IV site will be covered with a bandage (dressing). The procedure may vary among health care providers and hospitals. What happens after the  procedure?  Your blood pressure, temperature, pulse, and breathing will be monitored until you leave the hospital or clinic.  You may have some bruising and soreness at your IV site. Follow these instructions at home: Medicines  Take over-the-counter and prescription medicines only as told by your health care provider.  Talk with your health care provider before you take any medicines that contain aspirin or NSAIDs. These medicines increase your risk for dangerous bleeding. General instructions  Change or remove your dressing as told by your health care provider.  Return to your normal activities as told by your health care provider. Ask your health care provider what activities are safe for you.  Do not take baths, swim, or use a hot tub until your health care provider approves. Ask your health care provider if you may take showers.  Check your IV site every day for signs of infection. Check for: ? Redness, swelling, or pain. ? Fluid or blood. If fluid or blood drains from your IV site, use your hands to press down firmly on a bandage covering the area for a minute or two. Doing this should stop the bleeding. ? Warmth. ? Pus or a bad smell.  Keep all follow-up visits as told by your   health care provider. This is important. Contact a health care provider if you have:  A headache that does not go away with medicine.  Hives, rash, or itchy skin.  Nausea or vomiting.  Unusual tiredness or weakness.  Signs of infection at your IV site. Get help right away if:  You have a fever or chills.  You urinate less often than usual.  Your urine is darker colored than normal.  You have any of the following: ? Trouble breathing. ? Pain in your back, abdomen, or chest. ? Cool, clammy skin. ? A fast heartbeat. Summary  Platelets are tiny pieces of blood cells that clump together to form a blood clot when you have an injury. If you have too few platelets, your blood may have trouble  clotting.  A platelet transfusion is a procedure in which you receive donated platelets through an IV.  A platelet transfusion may be used to stop or prevent excessive bleeding.  After the procedure, check your IV site every day for signs of infection, including redness, swelling, pain, or warmth. This information is not intended to replace advice given to you by your health care provider. Make sure you discuss any questions you have with your health care provider. Document Released: 02/04/2007 Document Revised: 05/15/2017 Document Reviewed: 05/15/2017 Elsevier Interactive Patient Education  2019 [employer] Inc.  

## 2018-06-12 ENCOUNTER — Telehealth: Payer: Self-pay | Admitting: Oncology

## 2018-06-12 ENCOUNTER — Telehealth: Payer: Self-pay

## 2018-06-12 LAB — BPAM RBC
Blood Product Expiration Date: 202003162359
Blood Product Expiration Date: 202003162359
ISSUE DATE / TIME: 202002190953
ISSUE DATE / TIME: 202002190953
Unit Type and Rh: 5100
Unit Type and Rh: 5100

## 2018-06-12 LAB — TYPE AND SCREEN
ABO/RH(D): O POS
Antibody Screen: NEGATIVE
UNIT DIVISION: 0
Unit division: 0

## 2018-06-12 NOTE — Telephone Encounter (Signed)
No los for 02/19

## 2018-06-12 NOTE — Telephone Encounter (Signed)
Received call from [facility] PT with [facility] requesting verbal order for [facility] PT 1x/week x 4 weeks, 1x EOW x  3 weeks for strengthening, endurance, fall prevention, and balance training. Verbal order provided.

## 2018-06-12 NOTE — Telephone Encounter (Signed)
Scheduled appt per 2/19 sch message - pt wife is aware of appt date and time

## 2018-06-13 LAB — PREPARE PLATELET PHERESIS: Unit division: 0

## 2018-06-13 LAB — BPAM PLATELET PHERESIS
Blood Product Expiration Date: 202002192359
ISSUE DATE / TIME: 202002191000
Unit Type and Rh: 5100

## 2018-06-16 ENCOUNTER — Inpatient Hospital Stay: Payer: Medicare HMO

## 2018-06-17 ENCOUNTER — Telehealth: Payer: Self-pay | Admitting: *Deleted

## 2018-06-17 ENCOUNTER — Telehealth: Payer: Self-pay | Admitting: Oncology

## 2018-06-17 NOTE — Telephone Encounter (Signed)
This RN has left messages at home and cell #'s to call me re: re-scheduling blood transfusion, that patient cancelled on 06/16/18. He will need to be typed and crossed and may go to symptom management to start blood transfusion.

## 2018-06-17 NOTE — Telephone Encounter (Signed)
Scheduled appt per 2/25 sch message - pt wife is aware of appt date and time

## 2018-06-18 ENCOUNTER — Other Ambulatory Visit: Payer: Self-pay

## 2018-06-18 ENCOUNTER — Inpatient Hospital Stay (HOSPITAL_COMMUNITY)
Admission: EM | Admit: 2018-06-18 | Discharge: 2018-06-20 | DRG: 377 | Disposition: A | Discharge status: Home Health | Payer: Medicare HMO | Attending: Internal Medicine | Admitting: Internal Medicine

## 2018-06-18 ENCOUNTER — Inpatient Hospital Stay: Payer: Medicare HMO

## 2018-06-18 ENCOUNTER — Ambulatory Visit (HOSPITAL_BASED_OUTPATIENT_CLINIC_OR_DEPARTMENT_OTHER): Payer: Medicare HMO | Admitting: Medical

## 2018-06-18 ENCOUNTER — Encounter (HOSPITAL_COMMUNITY): Payer: Self-pay

## 2018-06-18 DIAGNOSIS — D61818 Other pancytopenia: Secondary | ICD-10-CM | POA: Diagnosis present

## 2018-06-18 DIAGNOSIS — I959 Hypotension, unspecified: Secondary | ICD-10-CM | POA: Diagnosis present

## 2018-06-18 DIAGNOSIS — C8311 Mantle cell lymphoma, lymph nodes of head, face, and neck: Secondary | ICD-10-CM

## 2018-06-18 DIAGNOSIS — Z8546 Personal history of malignant neoplasm of prostate: Secondary | ICD-10-CM

## 2018-06-18 DIAGNOSIS — R54 Age-related physical debility: Secondary | ICD-10-CM | POA: Diagnosis present

## 2018-06-18 DIAGNOSIS — E43 Unspecified severe protein-calorie malnutrition: Secondary | ICD-10-CM | POA: Diagnosis present

## 2018-06-18 DIAGNOSIS — N179 Acute kidney failure, unspecified: Secondary | ICD-10-CM | POA: Diagnosis present

## 2018-06-18 DIAGNOSIS — Z79891 Long term (current) use of opiate analgesic: Secondary | ICD-10-CM

## 2018-06-18 DIAGNOSIS — K31819 Angiodysplasia of stomach and duodenum without bleeding: Secondary | ICD-10-CM | POA: Diagnosis present

## 2018-06-18 DIAGNOSIS — Z681 Body mass index (BMI) 19 or less, adult: Secondary | ICD-10-CM | POA: Diagnosis not present

## 2018-06-18 DIAGNOSIS — D500 Iron deficiency anemia secondary to blood loss (chronic): Secondary | ICD-10-CM | POA: Diagnosis present

## 2018-06-18 DIAGNOSIS — Z9079 Acquired absence of other genital organ(s): Secondary | ICD-10-CM

## 2018-06-18 DIAGNOSIS — I671 Cerebral aneurysm, nonruptured: Secondary | ICD-10-CM | POA: Diagnosis present

## 2018-06-18 DIAGNOSIS — Z720 Tobacco use: Secondary | ICD-10-CM | POA: Diagnosis not present

## 2018-06-18 DIAGNOSIS — Z79899 Other long term (current) drug therapy: Secondary | ICD-10-CM

## 2018-06-18 DIAGNOSIS — K2981 Duodenitis with bleeding: Principal | ICD-10-CM | POA: Diagnosis present

## 2018-06-18 DIAGNOSIS — D619 Aplastic anemia, unspecified: Secondary | ICD-10-CM | POA: Diagnosis not present

## 2018-06-18 DIAGNOSIS — T451X5A Adverse effect of antineoplastic and immunosuppressive drugs, initial encounter: Secondary | ICD-10-CM | POA: Diagnosis present

## 2018-06-18 DIAGNOSIS — Z8249 Family history of ischemic heart disease and other diseases of the circulatory system: Secondary | ICD-10-CM | POA: Diagnosis not present

## 2018-06-18 DIAGNOSIS — Z923 Personal history of irradiation: Secondary | ICD-10-CM

## 2018-06-18 DIAGNOSIS — C8310 Mantle cell lymphoma, unspecified site: Secondary | ICD-10-CM

## 2018-06-18 DIAGNOSIS — K649 Unspecified hemorrhoids: Secondary | ICD-10-CM | POA: Diagnosis present

## 2018-06-18 DIAGNOSIS — Z66 Do not resuscitate: Secondary | ICD-10-CM | POA: Diagnosis present

## 2018-06-18 DIAGNOSIS — K922 Gastrointestinal hemorrhage, unspecified: Secondary | ICD-10-CM | POA: Diagnosis present

## 2018-06-18 DIAGNOSIS — K921 Melena: Secondary | ICD-10-CM | POA: Diagnosis not present

## 2018-06-18 DIAGNOSIS — D696 Thrombocytopenia, unspecified: Secondary | ICD-10-CM | POA: Diagnosis not present

## 2018-06-18 DIAGNOSIS — E870 Hyperosmolality and hypernatremia: Secondary | ICD-10-CM | POA: Diagnosis present

## 2018-06-18 DIAGNOSIS — C61 Malignant neoplasm of prostate: Secondary | ICD-10-CM | POA: Diagnosis present

## 2018-06-18 DIAGNOSIS — D649 Anemia, unspecified: Secondary | ICD-10-CM | POA: Diagnosis not present

## 2018-06-18 DIAGNOSIS — Z832 Family history of diseases of the blood and blood-forming organs and certain disorders involving the immune mechanism: Secondary | ICD-10-CM | POA: Diagnosis not present

## 2018-06-18 DIAGNOSIS — Z8673 Personal history of transient ischemic attack (TIA), and cerebral infarction without residual deficits: Secondary | ICD-10-CM | POA: Diagnosis not present

## 2018-06-18 DIAGNOSIS — Z801 Family history of malignant neoplasm of trachea, bronchus and lung: Secondary | ICD-10-CM

## 2018-06-18 DIAGNOSIS — R590 Localized enlarged lymph nodes: Secondary | ICD-10-CM | POA: Diagnosis present

## 2018-06-18 DIAGNOSIS — D6489 Other specified anemias: Secondary | ICD-10-CM

## 2018-06-18 LAB — CBC WITH DIFFERENTIAL (CANCER CENTER ONLY)
Abs Immature Granulocytes: 0.04 10*3/uL (ref 0.00–0.07)
Basophils Absolute: 0 10*3/uL (ref 0.0–0.1)
Basophils Relative: 1 %
Eosinophils Absolute: 0 10*3/uL (ref 0.0–0.5)
Eosinophils Relative: 1 %
HEMATOCRIT: 9.4 % — AB (ref 39.0–52.0)
Hemoglobin: 2.9 g/dL — CL (ref 13.0–17.0)
Immature Granulocytes: 2 %
LYMPHS PCT: 50 %
Lymphs Abs: 1.1 10*3/uL (ref 0.7–4.0)
MCH: 27.9 pg (ref 26.0–34.0)
MCHC: 30.9 g/dL (ref 30.0–36.0)
MCV: 90.4 fL (ref 80.0–100.0)
MONOS PCT: 14 %
Monocytes Absolute: 0.3 10*3/uL (ref 0.1–1.0)
Neutro Abs: 0.7 10*3/uL — ABNORMAL LOW (ref 1.7–7.7)
Neutrophils Relative %: 32 %
Platelet Count: 14 10*3/uL — ABNORMAL LOW (ref 150–400)
RBC: 1.04 MIL/uL — ABNORMAL LOW (ref 4.22–5.81)
RDW: 15.5 % (ref 11.5–15.5)
WBC Count: 2.2 10*3/uL — ABNORMAL LOW (ref 4.0–10.5)
nRBC: 4.1 % — ABNORMAL HIGH (ref 0.0–0.2)

## 2018-06-18 LAB — COMPREHENSIVE METABOLIC PANEL
ALT: 39 U/L (ref 0–44)
ANION GAP: 11 (ref 5–15)
AST: 26 U/L (ref 15–41)
Albumin: 2.2 g/dL — ABNORMAL LOW (ref 3.5–5.0)
Alkaline Phosphatase: 228 U/L — ABNORMAL HIGH (ref 38–126)
BUN: 65 mg/dL — ABNORMAL HIGH (ref 8–23)
CO2: 23 mmol/L (ref 22–32)
Calcium: 8.4 mg/dL — ABNORMAL LOW (ref 8.9–10.3)
Chloride: 116 mmol/L — ABNORMAL HIGH (ref 98–111)
Creatinine, Ser: 1.43 mg/dL — ABNORMAL HIGH (ref 0.61–1.24)
GFR calc non Af Amer: 48 mL/min — ABNORMAL LOW (ref >60)
GFR, EST AFRICAN AMERICAN: 55 mL/min — AB (ref >60)
Glucose, Bld: 134 mg/dL — ABNORMAL HIGH (ref 70–99)
Potassium: 3.4 mmol/L — ABNORMAL LOW (ref 3.5–5.1)
Sodium: 150 mmol/L — ABNORMAL HIGH (ref 135–145)
Total Bilirubin: 1 mg/dL (ref 0.3–1.2)
Total Protein: 4.8 g/dL — ABNORMAL LOW (ref 6.5–8.1)

## 2018-06-18 LAB — CBC
HEMATOCRIT: 8.8 % — AB (ref 39.0–52.0)
Hemoglobin: 2.6 g/dL — CL (ref 13.0–17.0)
MCH: 26.8 pg (ref 26.0–34.0)
MCHC: 29.5 g/dL — ABNORMAL LOW (ref 30.0–36.0)
MCV: 90.7 fL (ref 80.0–100.0)
NRBC: 4.8 % — AB (ref 0.0–0.2)
Platelets: 17 10*3/uL — CL (ref 150–400)
RBC: 0.97 MIL/uL — ABNORMAL LOW (ref 4.22–5.81)
RDW: 15.3 % (ref 11.5–15.5)
WBC: 1.1 10*3/uL — CL (ref 4.0–10.5)

## 2018-06-18 LAB — DIFFERENTIAL
BAND NEUTROPHILS: 10 %
Basophils Absolute: 0 10*3/uL (ref 0.0–0.1)
Basophils Relative: 0 %
Blasts: 0 %
Eosinophils Absolute: 0 10*3/uL (ref 0.0–0.5)
Eosinophils Relative: 1 %
Lymphocytes Relative: 37 %
Lymphs Abs: 0.4 10*3/uL — ABNORMAL LOW (ref 0.7–4.0)
Metamyelocytes Relative: 1 %
Monocytes Absolute: 0.1 10*3/uL (ref 0.1–1.0)
Monocytes Relative: 6 %
Myelocytes: 3 %
NRBC: 5 /100{WBCs} — AB
Neutro Abs: 0.6 10*3/uL — ABNORMAL LOW (ref 1.7–7.7)
Neutrophils Relative %: 36 %
Other: 6 %
Promyelocytes Relative: 0 %

## 2018-06-18 LAB — SAMPLE TO BLOOD BANK

## 2018-06-18 LAB — PREPARE RBC (CROSSMATCH)

## 2018-06-18 LAB — PROTIME-INR
INR: 1.1 (ref 0.8–1.2)
Prothrombin Time: 13.8 seconds (ref 11.4–15.2)

## 2018-06-18 LAB — POC OCCULT BLOOD, ED: Fecal Occult Bld: POSITIVE — AB

## 2018-06-18 LAB — PHOSPHORUS: Phosphorus: 6.2 mg/dL — ABNORMAL HIGH (ref 2.5–4.6)

## 2018-06-18 LAB — MAGNESIUM: MAGNESIUM: 2.2 mg/dL (ref 1.7–2.4)

## 2018-06-18 MED ORDER — SODIUM CHLORIDE 0.9% IV SOLUTION
Freq: Once | INTRAVENOUS | Status: AC
  Administered 2018-06-18: 19:00:00 via INTRAVENOUS

## 2018-06-18 MED ORDER — PANTOPRAZOLE SODIUM 40 MG IV SOLR
40.0000 mg | Freq: Once | INTRAVENOUS | Status: AC
  Administered 2018-06-18: 40 mg via INTRAVENOUS
  Filled 2018-06-18: qty 40

## 2018-06-18 MED ORDER — ACETAMINOPHEN 325 MG PO TABS: 650.0000 mg | ORAL_TABLET | Freq: Four times a day (QID) | ORAL | Status: DC | PRN

## 2018-06-18 MED ORDER — SODIUM CHLORIDE 0.9 % IV SOLN
10.0000 mL/h | Freq: Once | INTRAVENOUS | Status: AC
  Administered 2018-06-18: 10 mL/h via INTRAVENOUS

## 2018-06-18 MED ORDER — ACETAMINOPHEN 650 MG RE SUPP: 650.0000 mg | Freq: Four times a day (QID) | RECTAL | Status: DC | PRN

## 2018-06-18 MED ORDER — DEXTROSE-NACL 5-0.45 % IV SOLN
INTRAVENOUS | Status: DC
  Administered 2018-06-18: 22:00:00 via INTRAVENOUS

## 2018-06-18 MED ORDER — SODIUM CHLORIDE 0.9 % IV SOLN
INTRAVENOUS | Status: DC
  Administered 2018-06-18: 15:00:00 via INTRAVENOUS

## 2018-06-18 MED ORDER — ONDANSETRON HCL 4 MG/2ML IJ SOLN: 4.0000 mg | Freq: Four times a day (QID) | INTRAMUSCULAR | Status: DC | PRN

## 2018-06-18 MED ORDER — SODIUM CHLORIDE 0.9% IV SOLUTION: Freq: Once | INTRAVENOUS | Status: DC

## 2018-06-18 MED ORDER — ONDANSETRON HCL 4 MG PO TABS: 4.0000 mg | ORAL_TABLET | Freq: Four times a day (QID) | ORAL | Status: DC | PRN

## 2018-06-18 MED ORDER — SODIUM CHLORIDE 0.9 % IV SOLN
8.0000 mg/h | INTRAVENOUS | Status: DC
  Administered 2018-06-18 – 2018-06-20 (×4): 8 mg/h via INTRAVENOUS
  Filled 2018-06-18 (×9): qty 80

## 2018-06-18 MED ORDER — HYDROCODONE-ACETAMINOPHEN 5-325 MG PO TABS: 1.0000 | ORAL_TABLET | ORAL | Status: DC | PRN

## 2018-06-18 MED ORDER — POTASSIUM CHLORIDE 10 MEQ/100ML IV SOLN
10.0000 meq | INTRAVENOUS | Status: AC
  Administered 2018-06-18 (×2): 10 meq via INTRAVENOUS
  Filled 2018-06-18 (×2): qty 100

## 2018-06-18 NOTE — ED Notes (Signed)
Date and time results received: 06/18/18 1759 (use smartphrase ".now" to insert current time)  Test: WBC 1.1, PLT 17, HGB 2.6. Critical Value: WBC<PLT<HGB  Name of Provider Notified: Dr Ellender Hose  Orders Received? Or Actions Taken?: reported resutls to Dr Ellender Hose

## 2018-06-18 NOTE — ED Provider Notes (Signed)
78 year old male here with profound anemia in setting of suspected GI bleed as well as underlying mild dysplastic syndrome.  Hemoglobin 2.9.  Platelets 14.  Patient has been ordered for blood and platelet transfusion.  Awaiting GI consult.  Plan to admit.  Labs pending.  Hemoglobin 2.6.  I suspect this is real.  Of note, he is also leukopenic.  He had already received platelets prior to my assessment, but I discussed immediately with the blood bank.  They are preparing stat blood, and I will have them irradiated given his leukopenia.  Sent a text to go recover the blood in transfusion is running at this time.  Patient remains awake and alert.  He will receive blood in addition to platelets and may ultimately need plasma as well.  Otherwise, I discussed the case with GI who will see the patient.  They recommend Protonix bolus and drip, which I have ordered.  Also ordered additional units of blood.  CRITICAL CARE Performed by: Evonnie Pat   Total critical care time: 35 minutes  Critical care time was exclusive of separately billable procedures and treating other patients.  Critical care was necessary to treat or prevent imminent or life-threatening deterioration.  Critical care was time spent personally by me on the following activities: development of treatment plan with patient and/or surrogate as well as nursing, discussions with consultants, evaluation of patient's response to treatment, examination of patient, obtaining history from patient or surrogate, ordering and performing treatments and interventions, ordering and review of laboratory studies, ordering and review of radiographic studies, pulse oximetry and re-evaluation of patient's condition.    Duffy Bruce, MD 06/19/18 [phone]

## 2018-06-18 NOTE — ED Notes (Signed)
ED TO INPATIENT HANDOFF REPORT  Name/Age/Gender Joel [name] 78 y.o. male  Code Status Code Status History    Date Active Date Inactive Code Status Order ID Comments User Context   05/30/2018 2126 06/05/2018 2215 Full Code [id number]  Rise Patience, MD ED   05/16/2018 1707 05/27/2018 1745 Full Code [id number]  Reubin Milan, MD ED   03/20/2018 0200 03/21/2018 1951 Full Code [id number]  Ivor Costa, MD ED   07/28/2015 2119 08/01/2015 1831 Full Code [id number]  Ivor Costa, MD ED   07/08/2015 0053 07/13/2015 2315 Full Code [id number]  Ivor Costa, MD ED    Advance Directive Documentation     Most Recent Value  Type of Advance Directive  Healthcare Power of Attorney, Living will  Pre-existing out of facility DNR order (yellow form or pink MOST form)  -  "MOST" Form in Place?  -      Home/SNF/Other Home  Chief Complaint weakness   Level of Care/Admitting Diagnosis ED Disposition    ED Disposition Condition [place]: [place] [[id number]]  Level of Care: Stepdown [14]  Admit to SDU based on following criteria: Hemodynamic compromise or significant risk of instability:  Patient requiring short term acute titration and management of vasoactive drips, and invasive monitoring (i.e., CVP and Arterial line).  Diagnosis: GI bleed [[id number]]  Admitting Physician: Toy Baker [[id number]]  Attending Physician: Toy Baker [[id number]]  Estimated length of stay: 3 - 4 days  Certification:: I certify this patient will need inpatient services for at least 2 midnights  PT Class (Do Not Modify): Inpatient [101]  PT Acc Code (Do Not Modify): Private [1]       Medical History Past Medical History:  Diagnosis Date  . Anemia   . Aneurysm ([place])    in 1987, brain aneurysm  . Bilateral hydronephrosis   . History of radiation therapy 04/17/17- 05/07/17   Right neck treated to 32.5 Gy with 13 fx of 2.5 Gy  . mantle cell lymphoma dx'd 03/2017  . Mass of  right side of neck   . Pneumonia    hx of   . Prostate CA ([place]) dx'd 2017  . Urinary retention     Allergies No Known Allergies  IV Location/Drains/Wounds Patient Lines/Drains/Airways Status   Active Line/Drains/Airways    Name:   Placement date:   Placement time:   Site:   Days:   Peripheral IV 06/18/18 Right Antecubital   06/18/18    1447    Antecubital   less than 1   Peripheral IV 06/18/18 Left Antecubital   06/18/18    1555    Antecubital   less than 1          Labs/Imaging Results for orders placed or performed during the hospital encounter of 06/18/18 (from the past 48 hour(s))  Type and screen Plain     Status: None (Preliminary result)   Collection Time: 06/18/18  2:40 PM  Result Value Ref Range   ABO/RH(D) O POS    Antibody Screen NEG    Sample Expiration 06/21/2018    Unit Number [id number]    Blood Component Type RBC, LR IRR    Unit division 00    Status of Unit ALLOCATED    Transfusion Status OK TO TRANSFUSE    Crossmatch Result Compatible    Unit Number [id number]    Blood Component Type RBC, LR IRR    Unit division 00  Status of Unit ISSUED    Transfusion Status OK TO TRANSFUSE    Crossmatch Result      Compatible Performed at [facility] [address] [place], [place] [address]   Prepare RBC     Status: None   Collection Time: 06/18/18  2:41 PM  Result Value Ref Range   Order Confirmation      ORDER PROCESSED BY BLOOD BANK Performed at [facility], [place] [address], [place], [place] [address]   Prepare Pheresed Platelets     Status: None (Preliminary result)   Collection Time: 06/18/18  2:42 PM  Result Value Ref Range   Unit Number [id number]    Blood Component Type PLTPH LI2 PAS    Unit division 00    Status of Unit ISSUED    Transfusion Status      OK TO TRANSFUSE Performed at [facility] [address], [place], [place] [address]   POC  occult blood, ED     Status: Abnormal   Collection Time: 06/18/18  2:55 PM  Result Value Ref Range   Fecal Occult Bld POSITIVE (A) NEGATIVE  Comprehensive metabolic panel     Status: Abnormal   Collection Time: 06/18/18  4:02 PM  Result Value Ref Range   Sodium 150 (H) 135 - 145 mmol/L   Potassium 3.4 (L) 3.5 - 5.1 mmol/L   Chloride 116 (H) 98 - 111 mmol/L   CO2 23 22 - 32 mmol/L   Glucose, Bld 134 (H) 70 - 99 mg/dL   BUN 65 (H) 8 - 23 mg/dL   Creatinine, Ser 1.43 (H) 0.61 - 1.24 mg/dL   Calcium 8.4 (L) 8.9 - 10.3 mg/dL   Total Protein 4.8 (L) 6.5 - 8.1 g/dL   Albumin 2.2 (L) 3.5 - 5.0 g/dL   AST 26 15 - 41 U/L   ALT 39 0 - 44 U/L   Alkaline Phosphatase 228 (H) 38 - 126 U/L   Total Bilirubin 1.0 0.3 - 1.2 mg/dL   GFR calc non Af Amer 48 (L) >60 mL/min   GFR calc Af Amer 55 (L) >60 mL/min   Anion gap 11 5 - 15    Comment: Performed at [facility], [place] [address], [place], [place] [address]  Protime-INR     Status: None   Collection Time: 06/18/18  4:02 PM  Result Value Ref Range   Prothrombin Time 13.8 11.4 - 15.2 seconds   INR 1.1 0.8 - 1.2    Comment: (NOTE) INR goal varies based on device and disease states. Performed at [facility], [place] [address] [place], [place] [address]   CBC     Status: Abnormal   Collection Time: 06/18/18  4:45 PM  Result Value Ref Range   WBC 1.1 (LL) 4.0 - 10.5 K/uL    Comment: REPEATED TO VERIFY WHITE COUNT CONFIRMED ON SMEAR THIS CRITICAL RESULT HAS VERIFIED AND BEEN CALLED TO [facility] BY NATHAN THOMPSON ON 02 26 2020 AT 1757, AND HAS BEEN READ BACK. CRITICAL RESULT VERIFIED    RBC 0.97 (L) 4.22 - 5.81 MIL/uL   Hemoglobin 2.6 (LL) 13.0 - 17.0 g/dL    Comment: REPEATED TO VERIFY THIS CRITICAL RESULT HAS VERIFIED AND BEEN CALLED TO [facility] BY NATHAN THOMPSON ON 02 26 2020 AT 5329, AND HAS BEEN READ BACK. CRITICAL RESULT VERIFIED    HCT 8.8 (L) 39.0 - 52.0 %   MCV 90.7 80.0 - 100.0 fL  MCH 26.8  26.0 - 34.0 pg   MCHC 29.5 (L) 30.0 - 36.0 g/dL   RDW 15.3 11.5 - 15.5 %   Platelets 17 (LL) 150 - 400 K/uL    Comment: REPEATED TO VERIFY PLATELET COUNT CONFIRMED BY SMEAR SPECIMEN CHECKED FOR CLOTS THIS CRITICAL RESULT HAS VERIFIED AND BEEN CALLED TO [facility] BY NATHAN THOMPSON ON 02 26 2020 AT 70, AND HAS BEEN READ BACK. CRITICAL RESULT VERIFIED    nRBC 4.8 (H) 0.0 - 0.2 %    Comment: Performed at [facility], [place] [address] [place], [place] [address]   No results found.  Pending Labs Unresulted Labs (From admission, onward)    Start     Ordered   06/18/18 2050  Magnesium  Add-on,   R     06/18/18 2049   06/18/18 2050  Phosphorus  Add-on,   R     06/18/18 2049   06/18/18 2001  Prepare RBC  (Adult Blood Administration - Red Blood Cells)  Once,   R    Question Answer Comment  # of Units 2 units   Transfusion Indications Symptomatic Anemia   If emergent release call blood bank Not emergent release   Instructions: Transfuse      06/18/18 2001   06/18/18 2001  Prepare Pheresed Platelets  (Adult Blood Administration - Platelets (Pheresed))  Once,   R    Question Answer Comment  Number of Apheresis Units 1 unit (6-10 packs)   Transfusion Indications Actively Bleeding   If emergent release call blood bank Not emergent release      06/18/18 2001   06/18/18 1805  Prepare RBC  (Adult Blood Administration - Red Blood Cells)  Once,   R    Question Answer Comment  # of Units 4 units   Transfusion Indications Actively Bleeding / GI Bleed   Special Requirements Irradiated   Number of Units to Keep Ahead 4 units ahead   If emergent release call blood bank Not emergent release      06/18/18 1807   06/18/18 1616  Differential  Once,   STAT     06/18/18 1616   06/18/18 1440  CBC WITH DIFFERENTIAL  ONCE - STAT,   STAT     06/18/18 1442   Signed and Held  Magnesium  Tomorrow morning,   R    Comments:  Call MD if <1.5    Signed and Held   Signed and Held   Phosphorus  Tomorrow morning,   R     Signed and Held   Signed and Held  TSH  Once,   R    Comments:  Cancel if already done within 1 month and notify MD    Signed and Held   Signed and Held  Comprehensive metabolic panel  Once,   R    Comments:  Cal MD for K<3.5 or >5.0    Signed and Held   Signed and Held  CBC  Once,   R    Comments:  Call for hg <8.0    Signed and Held   Signed and Held  Prealbumin  Tomorrow morning,   R     Signed and Held          Vitals/Pain Today's Vitals   06/18/18 1903 06/18/18 1949 06/18/18 2010 06/18/18 2029  BP: 94/81  131/85 114/75  Pulse: 90  74 95  Resp: 14  12 15   Temp: 97.7 F (36.5 C)   (!)  97.3 F (36.3 C)  TempSrc: Axillary   Oral  SpO2: 100%  100% 100%  Weight:      Height:      PainSc:  0-No pain 0-No pain     Isolation Precautions Protective Precautions  Medications Medications  0.9 %  sodium chloride infusion ( Intravenous New Bag/Given 06/18/18 1449)  pantoprazole (PROTONIX) 80 mg in sodium chloride 0.9 % 250 mL (0.32 mg/mL) infusion (8 mg/hr Intravenous New Bag/Given 06/18/18 2009)  0.9 %  sodium chloride infusion (Manually program via Guardrails IV Fluids) (has no administration in time range)  potassium chloride 10 mEq in 100 mL IVPB (has no administration in time range)  pantoprazole (PROTONIX) injection 40 mg (40 mg Intravenous Given 06/18/18 1556)  0.9 %  sodium chloride infusion (0 mL/hr Intravenous Stopped 06/18/18 2017)  0.9 %  sodium chloride infusion (0 mL/hr Intravenous Stopped 06/18/18 2002)  pantoprazole (PROTONIX) injection 40 mg (40 mg Intravenous Given 06/18/18 2005)  0.9 %  sodium chloride infusion (Manually program via Guardrails IV Fluids) ( Intravenous Stopped 06/18/18 2017)    Mobility non-ambulatory

## 2018-06-18 NOTE — H&P (Signed)
Dock Baccam W[facility]:[id number] DOB: 11-14-1940 DOA: 06/18/2018     PCP: [name], MD   Outpatient Specialists:     NEurology  Dr. Leta Baptist    Oncology   Dr. Alen Blew GI  Dr. Therisa Doyne  [facility] ) Urology Dr. Jeffie Pollock  Patient arrived to ER on 06/18/18 at 1414  Patient coming from: home Lives  With family   Chief Complaint:  Chief Complaint  Patient presents with  . Fatigue    HPI: Joel [name] is a 78 y.o. 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  Per notes his oncologist have recommended hospice but at this point patient was still continued to receive supportive management with repeated transfusions. Today he went to his oncologist again and was noted to be severely anemic he has been having some dark stools.  No abdominal pain no fevers or chills.  Family reports decreased PO intake   Regarding pertinent Chronic problems:    status post excisional biopsy of a right neck mass completed on 03/27/2017 with the pathology confirming mantle cell lymphoma status post palliative radiation therapy to his cervical adenopathy completed in January 2019.  Therapy has been on hold because of pancytopenia since October 2019. Bone marrow failure currently has been on supportive management with blood and platelet transfusions his last transfusion was on 11 June 2018.  At the same time he also received 2 units of packed red blood cells overall by oncologist was told he has pleural prognosis.  Family reports 2-3 days of black stools but no blood in stools. No CP no syncope at home   While in ER: Initially BP in 80's  poorly responsive Now BP improved to 113/73 To be Hemoccult positive globin down to 2.9 platelets of 14 The following Work up has been ordered so far:  Orders Placed This Encounter  Procedures  . Critical Care  . Comprehensive metabolic panel  . CBC WITH DIFFERENTIAL  . Protime-INR  . CBC  . Differential  . Diet NPO time specified  . Orthostatic Vital signs  . Place X2 Large Bore IV's  . Initiate Carrier Fluid Protocol  . Complete patient signature process for consent form  . Practitioner attestation of consent  . Complete patient signature process for consent form  . Practitioner attestation of consent  . Place X2 Large Bore IV's  . Initiate Carrier Fluid Protocol  . Practitioner attestation of consent  . Complete patient signature process for consent form  . Consult to gastroenterology  . Consult to gastroenterology  . Consult to intensivist  . Consult to hospitalist  . Protective Precautions (isolation)  . Pulse oximetry, continuous  . Pulse oximetry, continuous  . POC occult blood, ED  . Type and screen [place]  . Prepare RBC  . Prepare Pheresed Platelets  . Prepare RBC    Following Medications were ordered in ER: Medications  0.9 %  sodium chloride infusion ( Intravenous New Bag/Given 06/18/18 1449)  0.9 %  sodium chloride infusion (has no administration in time range)  pantoprazole (PROTONIX) 80 mg in sodium chloride 0.9 % 250  mL (0.32 mg/mL) infusion (has no administration in time range)  pantoprazole (PROTONIX) injection 40 mg (has no administration in time range)  0.9 %  sodium chloride infusion (Manually program via Guardrails IV Fluids) (has no administration in time range)  pantoprazole (PROTONIX) injection 40 mg (40 mg Intravenous Given 06/18/18 1556)  0.9 %  sodium chloride infusion (10 mL/hr Intravenous New Bag/Given 06/18/18 1854)    Significant initial  Findings: Abnormal Labs Reviewed  COMPREHENSIVE METABOLIC PANEL - Abnormal;  Notable for the following components:      Result Value   Sodium 150 (*)    Potassium 3.4 (*)    Chloride 116 (*)    Glucose, Bld 134 (*)    BUN 65 (*)    Creatinine, Ser 1.43 (*)    Calcium 8.4 (*)    Total Protein 4.8 (*)    Albumin 2.2 (*)    Alkaline Phosphatase 228 (*)    GFR calc non Af Amer 48 (*)    GFR calc Af Amer 55 (*)    All other components within normal limits  CBC - Abnormal; Notable for the following components:   WBC 1.1 (*)    RBC 0.97 (*)    Hemoglobin 2.6 (*)    HCT 8.8 (*)    MCHC 29.5 (*)    Platelets 17 (*)    nRBC 4.8 (*)    All other components within normal limits  POC OCCULT BLOOD, ED - Abnormal; Notable for the following components:   Fecal Occult Bld POSITIVE (*)    All other components within normal limits     Lactic Acid, Venous No results found for: LATICACIDVEN   Cr  Up from baseline see below Lab Results  Component Value Date   CREATININE 1.43 (H) 06/18/2018   CREATININE 1.18 06/04/2018   CREATININE 1.14 06/03/2018       HG/HCT  Down  from baseline see below    Component Value Date/Time   HGB 2.6 (LL) 06/18/2018 1645   HGB 2.9 (LL) 06/18/2018 1301   HGB 7.2 (L) 04/12/2017 1423   HCT 8.8 (L) 06/18/2018 1645   HCT 22.3 (L) 04/12/2017 1423     Troponin (Point of Care Test) No results for input(s): TROPIPOC in the last 72 hours.     BNP (last 3 results) No results for input(s): BNP in the last 8760 hours.  ProBNP (last 3 results) No results for input(s): PROBNP in the last 8760 hours.     UA not ordered   ECG: not ordered      ED Triage Vitals  Enc Vitals Group     BP 06/18/18 1426 (!) 87/70     Pulse Rate 06/18/18 1426 75     Resp 06/18/18 1426 16     Temp 06/18/18 1426 97.7 F (36.5 C)     Temp Source 06/18/18 1426 Oral     SpO2 06/18/18 1426 98 %     Weight 06/18/18 1513 115 lb (52.2 kg)     Height 06/18/18 1513 5\' 7"  (1.702 m)     Head Circumference --      Peak Flow --      Pain Score 06/18/18  1647 0     Pain Loc --      Pain Edu? --      Excl. in [place]? --   TMAX(24)@       Latest  Blood pressure 94/81, pulse 90, temperature 97.7 F (36.5 C), temperature source Axillary, resp.  rate 14, height 5\' 7"  (1.702 m), weight 52.2 kg, SpO2 100 %.    ER Provider Called:   Gastroenterology They Recommend admit to medicine start Protonix drip and transfuse blood Will see in AM   Hospitalist was called for admission for the upper GI bleed in the setting of severe symptomatic anemia due to bone marrow failure   Review of Systems:    Pertinent positives include:fatigue, melena, loss of appetite,   Constitutional:  No weight loss, night sweats, Fevers, chills, weight loss  HEENT:  No headaches, Difficulty swallowing,Tooth/dental problems,Sore throat,  No sneezing, itching, ear ache, nasal congestion, post nasal drip,  Cardio-vascular:  No chest pain, Orthopnea, PND, anasarca, dizziness, palpitations.no Bilateral lower extremity swelling  GI:  No heartburn, indigestion, abdominal pain, nausea, vomiting, diarrhea, change in bowel habits  blood in stool, hematemesis Resp:  no shortness of breath at rest. No dyspnea on exertion, No excess mucus, no productive cough, No non-productive cough, No coughing up of blood.No change in color of mucus.No wheezing. Skin:  no rash or lesions. No jaundice GU:  no dysuria, change in color of urine, no urgency or frequency. No straining to urinate.  No flank pain.  Musculoskeletal:  No joint pain or no joint swelling. No decreased range of motion. No back pain.  Psych:  No change in mood or affect. No depression or anxiety. No memory loss.  Neuro: no localizing neurological complaints, no tingling, no weakness, no double vision, no gait abnormality, no slurred speech, no confusion  All systems reviewed and apart from [place] all are negative  Past Medical History:   Past Medical History:  Diagnosis Date  . Anemia   . Aneurysm ([place])    in 1987,  brain aneurysm  . Bilateral hydronephrosis   . History of radiation therapy 04/17/17- 05/07/17   Right neck treated to 32.5 Gy with 13 fx of 2.5 Gy  . mantle cell lymphoma dx'd 03/2017  . Mass of right side of neck   . Pneumonia    hx of   . Prostate CA ([place]) dx'd 2017  . Urinary retention       Past Surgical History:  Procedure Laterality Date  . BIOPSY  05/21/2018   Procedure: BIOPSY;  Surgeon: Milus Banister, MD;  Location: [facility];  Service: Gastroenterology;;  . COLONOSCOPY WITH PROPOFOL N/A 06/03/2018   Procedure: COLONOSCOPY WITH PROPOFOL;  Surgeon: Ronnette Juniper, MD;  Location: [facility];  Service: Gastroenterology;  Laterality: N/A;  . ESOPHAGOGASTRODUODENOSCOPY (EGD) WITH PROPOFOL N/A 05/21/2018   Procedure: ESOPHAGOGASTRODUODENOSCOPY (EGD) WITH PROPOFOL;  Surgeon: Milus Banister, MD;  Location: [facility];  Service: Gastroenterology;  Laterality: N/A;  . ESOPHAGOGASTRODUODENOSCOPY (EGD) WITH PROPOFOL N/A 06/03/2018   Procedure: ESOPHAGOGASTRODUODENOSCOPY (EGD) WITH PROPOFOL;  Surgeon: Ronnette Juniper, MD;  Location: [facility];  Service: Gastroenterology;  Laterality: N/A;  . HOT HEMOSTASIS N/A 05/21/2018   Procedure: HOT HEMOSTASIS (ARGON PLASMA COAGULATION/BICAP);  Surgeon: Milus Banister, MD;  Location: [facility];  Service: Gastroenterology;  Laterality: N/A;  . HOT HEMOSTASIS N/A 06/03/2018   Procedure: HOT HEMOSTASIS (ARGON PLASMA COAGULATION/BICAP);  Surgeon: Ronnette Juniper, MD;  Location: [facility];  Service: Gastroenterology;  Laterality: N/A;  . MASS BIOPSY Right 03/27/2017   Procedure: OPEN NECK MASS BIOPSY OF THE RIGHT NECK;  Surgeon: Leta Baptist, MD;  Location: [facility];  Service: ENT;  Laterality: Right;  . POLYPECTOMY  06/03/2018   Procedure: POLYPECTOMY;  Surgeon: Ronnette Juniper, MD;  Location: [facility];  Service: Gastroenterology;;  .  PROSTATE BIOPSY N/A 07/30/2015   Procedure: PROSTATE BIOPSY;  Surgeon: Irine Seal, MD;  Location: [facility];  Service: Urology;   Laterality: N/A;  . TRANSURETHRAL RESECTION OF PROSTATE N/A 07/30/2015   Procedure: TRANSURETHRAL RESECTION OF THE PROSTATE (TURP);  Surgeon: Irine Seal, MD;  Location: [facility];  Service: Urology;  Laterality: N/A;    Social History:  Ambulatory      reports that he quit smoking about 33 years ago. He has quit using smokeless tobacco.  His smokeless tobacco use included chew. He reports that he does not drink alcohol or use drugs.     Family History:   Family History  Problem Relation Age of Onset  . Dementia Mother   . Heart disease Father   . Lung cancer Brother   . Lupus Sister     Allergies: No Known Allergies   Prior to Admission medications   Medication Sig Start Date End Date Taking? Authorizing Provider  acetaminophen (TYLENOL) 325 MG tablet Take 325 mg by mouth every 6 (six) hours as needed for mild pain or headache.   Yes [provider]  diphenhydrAMINE (BENADRYL) 25 mg capsule Take 25 mg by mouth every 6 (six) hours as needed for sleep.   Yes [provider]  OVER THE COUNTER MEDICATION Take 1-2 drops by mouth 2 (two) times daily. Papaya drops (to help blood platelets)   Yes [provider]  feeding supplement, ENSURE ENLIVE, (ENSURE ENLIVE) LIQD Take 237 mLs by mouth 2 (two) times daily between meals. Patient not taking: Reported on 06/18/2018 03/22/18   Elodia Florence., MD  megestrol (MEGACE) 400 MG/10ML suspension Take 10 mLs (400 mg total) by mouth 2 (two) times daily. Patient not taking: Reported on 05/30/2018 03/10/18   Wyatt Portela, MD  pantoprazole (PROTONIX) 40 MG tablet Take 1 tablet (40 mg total) by mouth 2 (two) times daily for 30 days. Patient not taking: Reported on 06/10/2018 05/27/18 06/26/18  Terrilee Croak, MD  senna-docusate (SENOKOT-S) 8.6-50 MG tablet Take 1 tablet by mouth 2 (two) times daily for 30 days. Patient not taking: Reported on 05/30/2018 05/27/18 06/26/18  Terrilee Croak, MD   Physical Exam: Blood pressure 94/81,  pulse 90, temperature 97.7 F (36.5 C), temperature source Axillary, resp. rate 14, height 5\' 7"  (1.702 m), weight 52.2 kg, SpO2 100 %. 1. General:  in No Acute distress    Chronically ill  Cachectic acutely ill pale  -appearing 2. Psychological: Alert and   Oriented 3. Head/ENT:    Dry Mucous Membranes                          Head Non traumatic, neck supple                            Poor Dentition 4. SKIN:  decreased Skin turgor,  Skin clean Dry and intact no rash 5. Heart: Regular rate and rhythm no  Murmur, no Rub or gallop 6. Lungs:  no wheezes or crackles   7. Abdomen: Soft,  non-tender, Non distended  bowel sounds present 8. Lower extremities: no clubbing, cyanosis, 2+ edema 9. Neurologically Grossly intact, moving all 4 extremities equally   10. MSK: Normal range of motion   LABS:     Recent Labs  Lab 06/18/18 1301 06/18/18 1645  WBC 2.2* 1.1*  NEUTROABS 0.7*  --   HGB 2.9* 2.6*  HCT 9.4* 8.8*  MCV 90.4 90.7  PLT 14* 17*   Basic Metabolic Panel: Recent Labs  Lab 06/18/18 1602  NA 150*  K 3.4*  CL 116*  CO2 23  GLUCOSE 134*  BUN 65*  CREATININE 1.43*  CALCIUM 8.4*      Recent Labs  Lab 06/18/18 1602  AST 26  ALT 39  ALKPHOS 228*  BILITOT 1.0  PROT 4.8*  ALBUMIN 2.2*   No results for input(s): LIPASE, AMYLASE in the last 168 hours. No results for input(s): AMMONIA in the last 168 hours.    HbA1C: No results for input(s): HGBA1C in the last 72 hours. CBG: No results for input(s): GLUCAP in the last 168 hours.    Urine analysis:    Component Value Date/Time   COLORURINE YELLOW 05/17/2018 0757   APPEARANCEUR HAZY (A) 05/17/2018 0757   LABSPEC 1.013 05/17/2018 0757   PHURINE 6.0 05/17/2018 0757   GLUCOSEU NEGATIVE 05/17/2018 0757   HGBUR NEGATIVE 05/17/2018 0757   BILIRUBINUR NEGATIVE 05/17/2018 0757   BILIRUBINUR negative 07/05/2015 1801   KETONESUR NEGATIVE 05/17/2018 0757   PROTEINUR NEGATIVE 05/17/2018 0757   UROBILINOGEN 0.2  07/05/2015 1801   NITRITE NEGATIVE 05/17/2018 0757   LEUKOCYTESUR LARGE (A) 05/17/2018 0757       Cultures:    Component Value Date/Time   SDES  05/25/2018 0847    BLOOD LEFT HAND Performed at [facility], [place] [address] [place], [place] [address]    SPECREQUEST  05/25/2018 [phone]    BOTTLES DRAWN AEROBIC AND ANAEROBIC Blood Culture adequate volume Performed at [facility] [address] [place], [place] [address]    CULT  05/25/2018 [phone]    NO GROWTH 5 DAYS Performed at [facility], [place] [address] [place], [place] [address]    REPTSTATUS 05/30/2018 FINAL 05/25/2018 0847     Radiological Exams on Admission: No results found.  Chart has been reviewed    Assessment/Plan   78 y.o. 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 GI bleed -  - [place] score  Hg <25 systolic BP <366     Melena   >1 Justifies admission and aggressive management      Modifying risk factors include:   Prior hx of GI bleed severe thrombocytopenia severe  anemia       -    AIMS 65 = Alb <3,    Mental status change, SBP <90 (0-4) TOTAL of 2            Worrisome       -   hemodynamic instability present      - Admit to stepdown given above    - ER  Provider spoke to gastroenterology  they will see patient in a.m. appreciate their consult   - serial CBC.    - Monitor for any recurrence,  evidence of hemodynamic instability or significant blood loss  - Transfuse 3 units and recheck CBC may need more units    - Establish at least 2 PIV and fluid resuscitate   - clear liquids for tonight keep nothing by mouth post  midnight,   -  administer  Protonix  drip      . AKI (acute kidney injury) ([place]) rehydrate and follow renal status  . Protein-calorie malnutrition, severe ([place]) -will benefit from nutritional consult check prealbumin and order nutritional consult  . 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 Prostate cancer ([place]) -chronic currently stable  . Hypotension-in the setting of severe symptomatic anemia currently improving after aggressive interventions that are ongoing including IV fluid and blood transfusion  . Pancytopenia ([place]) in the setting of bone marrow failure felt to be secondary to chemotherapy and worsening prognosis  . AVM (arteriovenous malformation) of stomach, acquired may be contributing to GI blood loss she GI intervention    . Thrombocytopenia ([place]) given ongoing GI bleeding will transfuse 1 pack of platelets and continue to follow transfuse as needed  Hyper Natremia in the setting of poor p.o. intake administer D5 half normal name normal saline and monitor fluid status recheck sodium to avoid rapid overcorrection Other plan as per orders.  DVT prophylaxis:  SCD    Code Status:   DNR/DNI  as per patient overall very poor prognosis patient would benefit from palliative care consult although I feel today patient's family's not ready would readdress in the coming days I had personally discussed CODE STATUS with patient and family  I had spent 20 min discussing goals of care and CODE STATUS  Family Communication:   Family  at  Bedside  plan of care was discussed with  , Daughter, Wife,  Disposition Plan:      To home once workup is complete and patient is stable                      Would benefit from PT/OT eval  prior to DC  Ordered                                       Consults called: Gastroenterology     Admission status:    inpatient     Expect 2 midnight stay secondary to severity of patient's current illness including    hemodynamic instability despite optimal treatment (  Hypotension )   Severe lab/radiological/exam abnormalities including:  Severe Anemia, thrombocytopenia   and extensive comorbidities including:      malignancy, .    That are currently affecting medical management.   I expect  patient to be hospitalized for 2 midnights requiring inpatient medical care.  Patient is at high risk for adverse outcome (such as loss of life or disability) if not treated.  Indication for inpatient stay as follows:    Hemodynamic instability despite maximal medical therapy,    inability to maintain oral hydration    Need for operative/procedural  intervention    Need for  IV fluids,blood transfusion, iv medications       Level of care     SDU tele indefinitely please discontinue once patient no longer qualifies      Joel [name] 06/19/2018, 12:15 AM    [facility]     after 2 AM please page floor coverage PA If 7AM-7PM, please contact the day team taking care of the patient using  [email or web address]

## 2018-06-18 NOTE — ED Provider Notes (Signed)
[facility] DEPT Provider Note   CSN: [id number] Arrival date & time: 06/18/18  1414    History   Chief Complaint Chief Complaint  Patient presents with  . Fatigue    HPI Rees Santistevan is a 78 y.o. male.     HPI Patient presented to the emergency room for evaluation of anemia.  Patient has a history of recurrent pancytopenia.  He also has had recurrent GI bleeding.  Last admitted to the hospital on February 7.  He had outpatient follow-up with his oncologist.  He was noted to be anemic and received outpatient transfusions.  Patient went back to the oncology office today for follow-up.  He had a CBC done and was noted to be severely anemic.  Patient continues to feel fatigued and lightheaded.  He continues to notice dark stools.  Denies any abdominal pain.  No fevers or chills. Past Medical History:  Diagnosis Date  . Anemia   . Aneurysm ([place])    in 1987, brain aneurysm  . Bilateral hydronephrosis   . History of radiation therapy 04/17/17- 05/07/17   Right neck treated to 32.5 Gy with 13 fx of 2.5 Gy  . mantle cell lymphoma dx'd 03/2017  . Mass of right side of neck   . Pneumonia    hx of   . Prostate CA ([place]) dx'd 2017  . Urinary retention     Patient Active Problem List   Diagnosis Date Noted  . Acute GI bleeding 05/30/2018  . Gastritis and gastroduodenitis   . AVM (arteriovenous malformation) of stomach, acquired   . Syncope 03/20/2018  . Hypotension 03/20/2018  . Pancytopenia ([place]) 03/20/2018  . Stroke ([place]) 04/22/2017  . Prostate cancer ([place]) 04/12/2017  . 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 Bladder outlet obstruction 08/01/2015  . Anemia 07/29/2015  . Elevated serum creatinine   . Elevated prostate specific antigen (PSA) 07/28/2015  . Acute renal failure superimposed on stage 3 chronic kidney disease ([place]) 07/28/2015  . Urinary retention 07/28/2015  . AKI (acute kidney injury) ([place])  07/08/2015  . Normocytic anemia 07/08/2015  . Hyperkalemia 07/08/2015  . Elevated blood pressure 07/08/2015  . Renal failure 07/08/2015  . Diarrhea 07/08/2015  . Protein-calorie malnutrition, severe ([place]) 07/08/2015  . Bilateral leg edema 07/08/2015    Past Surgical History:  Procedure Laterality Date  . BIOPSY  05/21/2018   Procedure: BIOPSY;  Surgeon: Milus Banister, MD;  Location: [facility];  Service: Gastroenterology;;  . COLONOSCOPY WITH PROPOFOL N/A 06/03/2018   Procedure: COLONOSCOPY WITH PROPOFOL;  Surgeon: Ronnette Juniper, MD;  Location: [facility];  Service: Gastroenterology;  Laterality: N/A;  . ESOPHAGOGASTRODUODENOSCOPY (EGD) WITH PROPOFOL N/A 05/21/2018   Procedure: ESOPHAGOGASTRODUODENOSCOPY (EGD) WITH PROPOFOL;  Surgeon: Milus Banister, MD;  [facility];  Service: Gastroenterology;  Laterality: N/A;  . ESOPHAGOGASTRODUODENOSCOPY (EGD) WITH PROPOFOL N/A 06/03/2018   Procedure: ESOPHAGOGASTRODUODENOSCOPY (EGD) WITH PROPOFOL;  Surgeon: Ronnette Juniper, MD;  [facility];  Service: Gastroenterology;  Laterality: N/A;  . HOT HEMOSTASIS N/A 05/21/2018   Procedure: HOT HEMOSTASIS (ARGON PLASMA COAGULATION/BICAP);  Surgeon: Milus Banister, MD;  Location: [facility];  Service: Gastroenterology;  Laterality: N/A;  . HOT HEMOSTASIS N/A 06/03/2018   Procedure: HOT HEMOSTASIS (ARGON PLASMA COAGULATION/BICAP);  Surgeon: Ronnette Juniper, MD;  Location: [facility];  Service: Gastroenterology;  Laterality: N/A;  . MASS BIOPSY Right 03/27/2017   Procedure: OPEN NECK MASS BIOPSY OF THE RIGHT NECK;  Surgeon: Benjamine Mola,  Su, MD;  Location: [facility];  Service: ENT;  Laterality: Right;  . POLYPECTOMY  06/03/2018   Procedure: POLYPECTOMY;  Surgeon: Ronnette Juniper, MD;  Location: [facility];  Service: Gastroenterology;;  . PROSTATE BIOPSY N/A 07/30/2015   Procedure: PROSTATE BIOPSY;  Surgeon: Irine Seal, MD;  Location: [facility];  Service: Urology;  Laterality: N/A;  . TRANSURETHRAL RESECTION  OF PROSTATE N/A 07/30/2015   Procedure: TRANSURETHRAL RESECTION OF THE PROSTATE (TURP);  Surgeon: Irine Seal, MD;  Location: [facility];  Service: Urology;  Laterality: N/A;        Home Medications    Prior to Admission medications   Medication Sig Start Date End Date Taking? Authorizing Provider  acetaminophen (TYLENOL) 325 MG tablet Take 325 mg by mouth every 6 (six) hours as needed for mild pain or headache.   Yes [provider]  diphenhydrAMINE (BENADRYL) 25 mg capsule Take 25 mg by mouth every 6 (six) hours as needed for sleep.   Yes [provider]  OVER THE COUNTER MEDICATION Take 1-2 drops by mouth 2 (two) times daily. Papaya drops (to help blood platelets)   Yes [provider]  feeding supplement, ENSURE ENLIVE, (ENSURE ENLIVE) LIQD Take 237 mLs by mouth 2 (two) times daily between meals. Patient not taking: Reported on 06/18/2018 03/22/18   Elodia Florence., MD  megestrol (MEGACE) 400 MG/10ML suspension Take 10 mLs (400 mg total) by mouth 2 (two) times daily. Patient not taking: Reported on 05/30/2018 03/10/18   Wyatt Portela, MD  pantoprazole (PROTONIX) 40 MG tablet Take 1 tablet (40 mg total) by mouth 2 (two) times daily for 30 days. Patient not taking: Reported on 06/10/2018 05/27/18 06/26/18  Terrilee Croak, MD  senna-docusate (SENOKOT-S) 8.6-50 MG tablet Take 1 tablet by mouth 2 (two) times daily for 30 days. Patient not taking: Reported on 05/30/2018 05/27/18 06/26/18  Terrilee Croak, MD    Family History Family History  Problem Relation Age of Onset  . Dementia Mother   . Heart disease Father   . Lung cancer Brother   . Lupus Sister     Social History Social History   Tobacco Use  . Smoking status: Former Smoker    Last attempt to quit: 04/28/1985    Years since quitting: 33.1  . Smokeless tobacco: Former Systems developer    Types: Chew  . Tobacco comment: quit 30-40 yrs. aog  Substance Use Topics  . Alcohol use: No    Alcohol/week: 0.0 standard drinks    . Drug use: No     Allergies   Patient has no known allergies.   Review of Systems Review of Systems  All other systems reviewed and are negative.    Physical Exam Updated Vital Signs BP (!) 87/70 (BP Location: Right Arm)   Pulse 75   Temp 97.7 F (36.5 C) (Oral)   Resp 16   Ht 1.702 m (5\' 7" )   Wt 52.2 kg   SpO2 98%   BMI 18.01 kg/m   Physical Exam Vitals signs and nursing note reviewed.  Constitutional:      General: He is not in acute distress.    Appearance: He is well-developed.     Comments: Thin, pale, underweight  HENT:     Head: Normocephalic and atraumatic.     Right Ear: External ear normal.     Left Ear: External ear normal.  Eyes:     General: No scleral icterus.       Right eye: No  discharge.        Left eye: No discharge.     Conjunctiva/sclera: Conjunctivae normal.  Neck:     Musculoskeletal: Neck supple.     Trachea: No tracheal deviation.  Cardiovascular:     Rate and Rhythm: Normal rate and regular rhythm.  Pulmonary:     Effort: Pulmonary effort is normal. No respiratory distress.     Breath sounds: Normal breath sounds. No stridor. No wheezing or rales.  Abdominal:     General: Bowel sounds are normal. There is no distension.     Palpations: Abdomen is soft.     Tenderness: There is no abdominal tenderness. There is no guarding or rebound.  Genitourinary:    Comments: Dark black stool on rectal exam Musculoskeletal:        General: No tenderness.  Skin:    General: Skin is warm and dry.     Coloration: Skin is pale.     Findings: No rash.  Neurological:     Cranial Nerves: No cranial nerve deficit (no facial droop, extraocular movements intact, no slurred speech).     Sensory: No sensory deficit.     Motor: No abnormal muscle tone or seizure activity.     Coordination: Coordination normal.      ED Treatments / Results  Labs (all labs ordered are listed, but only abnormal results are displayed) Labs Reviewed  POC OCCULT  BLOOD, ED - Abnormal; Notable for the following components:      Result Value   Fecal Occult Bld POSITIVE (*)    All other components within normal limits  COMPREHENSIVE METABOLIC PANEL  CBC WITH DIFFERENTIAL/PLATELET  PROTIME-INR  TYPE AND SCREEN  PREPARE RBC (CROSSMATCH)  PREPARE PLATELET PHERESIS  Outpatient labs reviewed.  Patient's white blood cell count was 2.2 which is similar to his baseline.  Hemoglobin was down to 2.9.  Previous was 6.9.  Platelet count was down to 14.  7 days ago it is 17.   Procedures .Critical Care Performed by: Dorie Rank, MD Authorized by: Dorie Rank, MD   Critical care provider statement:    Critical care time (minutes):  45   Critical care was time spent personally by me on the following activities:  Discussions with consultants, evaluation of patient's response to treatment, examination of patient, ordering and performing treatments and interventions, ordering and review of laboratory studies, ordering and review of radiographic studies, pulse oximetry, re-evaluation of patient's condition, obtaining history from patient or surrogate and review of old charts   (including critical care time)  Medications Ordered in ED Medications  0.9 %  sodium chloride infusion ( Intravenous New Bag/Given 06/18/18 1449)  0.9 %  sodium chloride infusion (has no administration in time range)  0.9 %  sodium chloride infusion (has no administration in time range)  pantoprazole (PROTONIX) injection 40 mg (40 mg Intravenous Given 06/18/18 1556)     Initial Impression / Assessment and Plan / ED Course  I have reviewed the triage vital signs and the nursing notes.  Pertinent labs & imaging results that were available during my care of the patient were reviewed by me and considered in my medical decision making (see chart for details).      Patient presented to the emergency room for evaluation of recurrent anemia and GI bleeding.  Patient was recently in the  hospital  for the same.  Patient was found to be profoundly anemic pancytopenic and labs are drawn at the cancer center today.  Patient  was initially noted to be hypotensive with blood pressure in the 80s.  Fortunately he was not in any significant distress.  Platelets, red blood cells have been ordered.  GI consult has been ordered.  Patient will need admitted for further treatment.  Final Clinical Impressions(s) / ED Diagnoses   Final diagnoses:  Gastrointestinal hemorrhage, unspecified gastrointestinal hemorrhage type  Pancytopenia (HCC)     Dorie Rank, MD 06/18/18 [phone]

## 2018-06-18 NOTE — ED Notes (Signed)
Bed: WA21 Expected date:  Expected time:  Means of arrival:  Comments: [place]

## 2018-06-18 NOTE — ED Triage Notes (Signed)
Patient is from the cancer center. 7 days ago Hemoglobin was 6.9 and 2 units of PRBC were given. Patient presents today with similar symptoms as before. Cancer center PA reports lethargy.   Cancer center vitals: 74/48 BP 68 HR 100% O2 sats on room air.

## 2018-06-19 ENCOUNTER — Inpatient Hospital Stay (HOSPITAL_COMMUNITY): Payer: Medicare HMO | Admitting: Certified Registered"

## 2018-06-19 ENCOUNTER — Encounter (HOSPITAL_COMMUNITY)
Admission: EM | Disposition: A | Discharge status: Home Health | Payer: Self-pay | Source: Home / Self Care | Attending: Internal Medicine

## 2018-06-19 DIAGNOSIS — C8310 Mantle cell lymphoma, unspecified site: Secondary | ICD-10-CM

## 2018-06-19 DIAGNOSIS — E870 Hyperosmolality and hypernatremia: Secondary | ICD-10-CM | POA: Diagnosis present

## 2018-06-19 DIAGNOSIS — D500 Iron deficiency anemia secondary to blood loss (chronic): Secondary | ICD-10-CM

## 2018-06-19 DIAGNOSIS — D619 Aplastic anemia, unspecified: Secondary | ICD-10-CM

## 2018-06-19 HISTORY — PX: ESOPHAGOGASTRODUODENOSCOPY (EGD) WITH PROPOFOL: SHX5813

## 2018-06-19 LAB — COMPREHENSIVE METABOLIC PANEL
ALT: 37 U/L (ref 0–44)
AST: 28 U/L (ref 15–41)
Albumin: 2.2 g/dL — ABNORMAL LOW (ref 3.5–5.0)
Alkaline Phosphatase: 221 U/L — ABNORMAL HIGH (ref 38–126)
Anion gap: 11 (ref 5–15)
BUN: 55 mg/dL — ABNORMAL HIGH (ref 8–23)
CO2: 23 mmol/L (ref 22–32)
Calcium: 8.2 mg/dL — ABNORMAL LOW (ref 8.9–10.3)
Chloride: 117 mmol/L — ABNORMAL HIGH (ref 98–111)
Creatinine, Ser: 1.43 mg/dL — ABNORMAL HIGH (ref 0.61–1.24)
GFR calc Af Amer: 55 mL/min — ABNORMAL LOW (ref >60)
GFR calc non Af Amer: 48 mL/min — ABNORMAL LOW (ref >60)
Glucose, Bld: 111 mg/dL — ABNORMAL HIGH (ref 70–99)
Potassium: 3.2 mmol/L — ABNORMAL LOW (ref 3.5–5.1)
Sodium: 151 mmol/L — ABNORMAL HIGH (ref 135–145)
Total Bilirubin: 1.5 mg/dL — ABNORMAL HIGH (ref 0.3–1.2)
Total Protein: 5 g/dL — ABNORMAL LOW (ref 6.5–8.1)

## 2018-06-19 LAB — CBC
HCT: 24.4 % — ABNORMAL LOW (ref 39.0–52.0)
HCT: 25.3 % — ABNORMAL LOW (ref 39.0–52.0)
HCT: 25.2 % — ABNORMAL LOW (ref 39.0–52.0)
HEMOGLOBIN: 8.3 g/dL — AB (ref 13.0–17.0)
Hemoglobin: 8.3 g/dL — ABNORMAL LOW (ref 13.0–17.0)
Hemoglobin: 8.3 g/dL — ABNORMAL LOW (ref 13.0–17.0)
MCH: 30 pg (ref 26.0–34.0)
MCH: 29.4 pg (ref 26.0–34.0)
MCH: 29.6 pg (ref 26.0–34.0)
MCHC: 34 g/dL (ref 30.0–36.0)
MCHC: 32.8 g/dL (ref 30.0–36.0)
MCHC: 33.5 g/dL (ref 30.0–36.0)
MCV: 88.1 fL (ref 80.0–100.0)
MCV: 89.7 fL (ref 80.0–100.0)
MCV: 90 fL (ref 80.0–100.0)
Platelets: 47 10*3/uL — ABNORMAL LOW (ref 150–400)
Platelets: 47 10*3/uL — ABNORMAL LOW (ref 150–400)
Platelets: 48 10*3/uL — ABNORMAL LOW (ref 150–400)
RBC: 2.77 MIL/uL — ABNORMAL LOW (ref 4.22–5.81)
RBC: 2.82 MIL/uL — AB (ref 4.22–5.81)
RBC: 2.81 MIL/uL — ABNORMAL LOW (ref 4.22–5.81)
RDW: 13.2 % (ref 11.5–15.5)
RDW: 13.9 % (ref 11.5–15.5)
RDW: 14 % (ref 11.5–15.5)
WBC: 1.3 10*3/uL — CL (ref 4.0–10.5)
WBC: 1.8 10*3/uL — ABNORMAL LOW (ref 4.0–10.5)
WBC: 1.4 10*3/uL — CL (ref 4.0–10.5)
nRBC: 5.3 % — ABNORMAL HIGH (ref 0.0–0.2)
nRBC: 6.1 % — ABNORMAL HIGH (ref 0.0–0.2)
nRBC: 9.4 % — ABNORMAL HIGH (ref 0.0–0.2)

## 2018-06-19 LAB — BASIC METABOLIC PANEL
ANION GAP: 9 (ref 5–15)
BUN: 52 mg/dL — ABNORMAL HIGH (ref 8–23)
CO2: 23 mmol/L (ref 22–32)
Calcium: 8.1 mg/dL — ABNORMAL LOW (ref 8.9–10.3)
Chloride: 120 mmol/L — ABNORMAL HIGH (ref 98–111)
Creatinine, Ser: 1.36 mg/dL — ABNORMAL HIGH (ref 0.61–1.24)
GFR calc Af Amer: 59 mL/min — ABNORMAL LOW (ref >60)
GFR calc non Af Amer: 51 mL/min — ABNORMAL LOW (ref >60)
Glucose, Bld: 104 mg/dL — ABNORMAL HIGH (ref 70–99)
Potassium: 3.1 mmol/L — ABNORMAL LOW (ref 3.5–5.1)
Sodium: 152 mmol/L — ABNORMAL HIGH (ref 135–145)

## 2018-06-19 LAB — PREPARE PLATELET PHERESIS: Unit division: 0

## 2018-06-19 LAB — PATHOLOGIST SMEAR REVIEW

## 2018-06-19 LAB — BPAM PLATELET PHERESIS
Blood Product Expiration Date: 202002282359
ISSUE DATE / TIME: 202002261621
Unit Type and Rh: 5100

## 2018-06-19 LAB — MRSA PCR SCREENING: MRSA by PCR: NEGATIVE

## 2018-06-19 LAB — TSH: TSH: 4.334 u[IU]/mL (ref 0.350–4.500)

## 2018-06-19 LAB — PHOSPHORUS: Phosphorus: 5.8 mg/dL — ABNORMAL HIGH (ref 2.5–4.6)

## 2018-06-19 LAB — MAGNESIUM: Magnesium: 2.1 mg/dL (ref 1.7–2.4)

## 2018-06-19 LAB — PREALBUMIN: Prealbumin: 12.7 mg/dL — ABNORMAL LOW (ref 18–38)

## 2018-06-19 SURGERY — ESOPHAGOGASTRODUODENOSCOPY (EGD) WITH PROPOFOL
Anesthesia: Monitor Anesthesia Care

## 2018-06-19 MED ORDER — PROPOFOL 500 MG/50ML IV EMUL
INTRAVENOUS | Status: DC | PRN
  Administered 2018-06-19: 75 ug/kg/min via INTRAVENOUS

## 2018-06-19 MED ORDER — PROPOFOL 10 MG/ML IV BOLUS
INTRAVENOUS | Status: AC
  Filled 2018-06-19: qty 40

## 2018-06-19 MED ORDER — GLYCOPYRROLATE 0.2 MG/ML IJ SOLN
INTRAMUSCULAR | Status: DC | PRN
  Administered 2018-06-19: 0.2 mg via INTRAVENOUS

## 2018-06-19 MED ORDER — PROPOFOL 10 MG/ML IV BOLUS
INTRAVENOUS | Status: DC | PRN
  Administered 2018-06-19: 20 mg via INTRAVENOUS

## 2018-06-19 MED ORDER — SODIUM CHLORIDE 0.9 % IV SOLN
INTRAVENOUS | Status: DC | PRN
  Administered 2018-06-19: 13:00:00 via INTRAVENOUS

## 2018-06-19 MED ORDER — CHLORHEXIDINE GLUCONATE CLOTH 2 % EX PADS
6.0000 | MEDICATED_PAD | Freq: Every day | CUTANEOUS | Status: DC
  Administered 2018-06-19 – 2018-06-20 (×2): 6 via TOPICAL

## 2018-06-19 MED ORDER — THIAMINE HCL 100 MG/ML IJ SOLN
100.0000 mg | Freq: Every day | INTRAMUSCULAR | Status: DC
  Administered 2018-06-19 – 2018-06-20 (×2): 100 mg via INTRAVENOUS
  Filled 2018-06-19 (×2): qty 2

## 2018-06-19 MED ORDER — CHLORHEXIDINE GLUCONATE CLOTH 2 % EX PADS: 6.0000 | MEDICATED_PAD | Freq: Every day | CUTANEOUS | Status: DC

## 2018-06-19 MED ORDER — SODIUM CHLORIDE 0.45 % IV SOLN
INTRAVENOUS | Status: DC
  Administered 2018-06-19 – 2018-06-20 (×4): via INTRAVENOUS

## 2018-06-19 SURGICAL SUPPLY — 15 items

## 2018-06-19 NOTE — Progress Notes (Signed)
PT Cancellation Note  Patient Details Name: Marquet Faircloth MRN: [id number] DOB: 1940-09-10   Cancelled Treatment:    Reason Eval/Treat Not Completed: Medical issues which prohibited therapy , per RN. Check back another time when stable to mobilize.   Claretha Cooper 06/19/2018, 8:48 AM  [place] Pager [phone] Office [phone]

## 2018-06-19 NOTE — Consult Note (Signed)
[facility] Consult  Referring Provider: Toy Baker, MD Primary Care Physician:  [name], MD Primary Gastroenterologist: Althia Forts  Reason for Consultation:  Severe anemia  HPI: Joel [name] is a 78 y.o. male with history of bone marrow fibrosis,mantle cell lymphoma, prostate cancer was hospitalized due to severe anemia, hemoglobin of 2.9 noted during outpatient lab(he is scheduled to get weekly labs and transfusion if needed). Patient states he has been having 1 bowel movement daily, stools are dark brown, denies seeing black stools or bloody bowel movements. He is not on a PPI or iron. He had an EGD on 06/03/2018 which showed one small nonbleeding AVM, treated with APC, and small superficial ulceration, prior site of APC, mild duodenal deformity. Colonoscopy from 06/03/2018 showed hemorrhoids and 5 tubular adenomas were removed. For the last few days patient reports worsening weakness and decreased appetite. He denies nausea, vomiting, heartburn, difficulty swallowing or pain on swallowing.    Past Medical History:  Diagnosis Date  . Anemia   . Aneurysm ([place])    in 1987, brain aneurysm  . Bilateral hydronephrosis   . History of radiation therapy 04/17/17- 05/07/17   Right neck treated to 32.5 Gy with 13 fx of 2.5 Gy  . mantle cell lymphoma dx'd 03/2017  . Mass of right side of neck   . Pneumonia    hx of   . Prostate CA ([place]) dx'd 2017  . Urinary retention     Past Surgical History:  Procedure Laterality Date  . BIOPSY  05/21/2018   Procedure: BIOPSY;  Surgeon: Milus Banister, MD;  Location: [facility];  Service: Gastroenterology;;  . COLONOSCOPY WITH PROPOFOL N/A 06/03/2018   Procedure: COLONOSCOPY WITH PROPOFOL;  Surgeon: Ronnette Juniper, MD;  [facility];  Service: Gastroenterology;  Laterality: N/A;  . ESOPHAGOGASTRODUODENOSCOPY (EGD) WITH PROPOFOL N/A 05/21/2018   Procedure: ESOPHAGOGASTRODUODENOSCOPY (EGD) WITH PROPOFOL;  Surgeon:  Milus Banister, MD;  [facility];  Service: Gastroenterology;  Laterality: N/A;  . ESOPHAGOGASTRODUODENOSCOPY (EGD) WITH PROPOFOL N/A 06/03/2018   Procedure: ESOPHAGOGASTRODUODENOSCOPY (EGD) WITH PROPOFOL;  Surgeon: Ronnette Juniper, MD;  [facility];  Service: Gastroenterology;  Laterality: N/A;  . HOT HEMOSTASIS N/A 05/21/2018   Procedure: HOT HEMOSTASIS (ARGON PLASMA COAGULATION/BICAP);  Surgeon: Milus Banister, MD;  Location: [facility];  Service: Gastroenterology;  Laterality: N/A;  . HOT HEMOSTASIS N/A 06/03/2018   Procedure: HOT HEMOSTASIS (ARGON PLASMA COAGULATION/BICAP);  Surgeon: Ronnette Juniper, MD;  Location: [facility];  Service: Gastroenterology;  Laterality: N/A;  . MASS BIOPSY Right 03/27/2017   Procedure: OPEN NECK MASS BIOPSY OF THE RIGHT NECK;  Surgeon: Leta Baptist, MD;  Location: [facility];  Service: ENT;  Laterality: Right;  . POLYPECTOMY  06/03/2018   Procedure: POLYPECTOMY;  Surgeon: Ronnette Juniper, MD;  Location: [facility];  Service: Gastroenterology;;  . PROSTATE BIOPSY N/A 07/30/2015   Procedure: PROSTATE BIOPSY;  Surgeon: Irine Seal, MD;  Location: [facility];  Service: Urology;  Laterality: N/A;  . TRANSURETHRAL RESECTION OF PROSTATE N/A 07/30/2015   Procedure: TRANSURETHRAL RESECTION OF THE PROSTATE (TURP);  Surgeon: Irine Seal, MD;  [facility]: [facility];  Service: Urology;  Laterality: N/A;    Prior to Admission medications   Medication Sig Start Date End Date Taking? Authorizing Provider  acetaminophen (TYLENOL) 325 MG tablet Take 325 mg by mouth every 6 (six) hours as needed for mild pain or headache.   Yes [provider]  diphenhydrAMINE (BENADRYL) 25 mg capsule Take 25 mg by mouth every 6 (six) hours  as needed for sleep.   Yes [provider]  OVER THE COUNTER MEDICATION Take 1-2 drops by mouth 2 (two) times daily. Papaya drops (to help blood platelets)   Yes [provider]  feeding supplement, ENSURE ENLIVE, (ENSURE ENLIVE) LIQD Take  237 mLs by mouth 2 (two) times daily between meals. Patient not taking: Reported on 06/18/2018 03/22/18   Elodia Florence., MD  megestrol (MEGACE) 400 MG/10ML suspension Take 10 mLs (400 mg total) by mouth 2 (two) times daily. Patient not taking: Reported on 05/30/2018 03/10/18   Wyatt Portela, MD  pantoprazole (PROTONIX) 40 MG tablet Take 1 tablet (40 mg total) by mouth 2 (two) times daily for 30 days. Patient not taking: Reported on 06/10/2018 05/27/18 06/26/18  Terrilee Croak, MD  senna-docusate (SENOKOT-S) 8.6-50 MG tablet Take 1 tablet by mouth 2 (two) times daily for 30 days. Patient not taking: Reported on 05/30/2018 05/27/18 06/26/18  Terrilee Croak, MD    Current Facility-Administered Medications  Medication Dose Route Frequency Provider Last Rate Last Dose  . 0.45 % sodium chloride infusion   Intravenous Continuous Georgette Shell, MD 125 mL/hr at 06/19/18 1032    . 0.9 %  sodium chloride infusion (Manually program via Guardrails IV Fluids)   Intravenous Once Toy Baker, MD      . acetaminophen (TYLENOL) tablet 650 mg  650 mg Oral Q6H PRN Doutova, Anastassia, MD       Or  . acetaminophen (TYLENOL) suppository 650 mg  650 mg Rectal Q6H PRN Doutova, Anastassia, MD      . Chlorhexidine Gluconate Cloth 2 % PADS 6 each  6 each Topical Q0600 Doutova, Anastassia, MD      . dextrose 5 %-0.45 % sodium chloride infusion   Intravenous Continuous Toy Baker, MD   Stopped at 06/19/18 1024  . HYDROcodone-acetaminophen (NORCO/VICODIN) 5-325 MG per tablet 1-2 tablet  1-2 tablet Oral Q4H PRN Toy Baker, MD      . ondansetron (ZOFRAN) tablet 4 mg  4 mg Oral Q6H PRN Doutova, Anastassia, MD       Or  . ondansetron (ZOFRAN) injection 4 mg  4 mg Intravenous Q6H PRN Doutova, Anastassia, MD      . pantoprazole (PROTONIX) 80 mg in sodium chloride 0.9 % 250 mL (0.32 mg/mL) infusion  8 mg/hr Intravenous Continuous Doutova, Anastassia, MD 25 mL/hr at 06/19/18 1024 8 mg/hr at 06/19/18  1024  . thiamine (B-1) injection 100 mg  100 mg Intravenous Daily Doutova, Anastassia, MD   100 mg at 06/19/18 1022    Allergies as of 06/18/2018  . (No Known Allergies)    Family History  Problem Relation Age of Onset  . Dementia Mother   . Heart disease Father   . Lung cancer Brother   . Lupus Sister     Social History   Socioeconomic History  . Marital status: Married    Spouse name: Sallie  . Number of children: 1  . Years of education: 75  . Highest education level: Associate degree: occupational, Hotel manager, or vocational program  Occupational History    Comment: retired  Scientific laboratory technician  . Financial resource strain: Not on file  . Food insecurity:    Worry: Not on file    Inability: Not on file  . Transportation needs:    Medical: Not on file    Non-medical: Not on file  Tobacco Use  . Smoking status: Former Smoker    Last attempt to quit: 04/28/1985  Years since quitting: 33.1  . Smokeless tobacco: Former Systems developer    Types: Chew  . Tobacco comment: quit 30-40 yrs. aog  Substance and Sexual Activity  . Alcohol use: No    Alcohol/week: 0.0 standard drinks  . Drug use: No  . Sexual activity: Not on file  Lifestyle  . Physical activity:    Days per week: Not on file    Minutes per session: Not on file  . Stress: Not on file  Relationships  . Social connections:    Talks on phone: Not on file    Gets together: Not on file    Attends religious service: Not on file    Active member of club or organization: Not on file    Attends meetings of clubs or organizations: Not on file    Relationship status: Not on file  . Intimate partner violence:    Fear of current or ex partner: Not on file    Emotionally abused: Not on file    Physically abused: Not on file    Forced sexual activity: Not on file  Other Topics Concern  . Not on file  Social History Narrative   Lives with wife   Caffeine- not much    Review of Systems: Positive for: GI: Described in detail  in HPI.    Gen: anorexia, fatigue, weakness, malaise, Denies any fever, chills, rigors, night sweats, involuntary weight loss, and sleep disorder CV: Denies chest pain, angina, palpitations, syncope, orthopnea, PND, peripheral edema, and claudication. Resp: Denies dyspnea, cough, sputum, wheezing, coughing up blood. GU : Denies urinary burning, blood in urine, urinary frequency, urinary hesitancy, nocturnal urination, and urinary incontinence. MS: Denies joint pain or swelling.  Denies muscle weakness, cramps, atrophy.  Derm: Denies rash, itching, oral ulcerations, hives, unhealing ulcers.  Psych: Denies depression, anxiety, memory loss, suicidal ideation, hallucinations,  and confusion. Heme: Denies bruising, bleeding, and enlarged lymph nodes. Neuro:  Denies any headaches, dizziness, paresthesias. Endo:  Denies any problems with DM, thyroid, adrenal function.  Physical Exam: Vital signs in last 24 hours: Temp:  [97.1 F (36.2 C)-98.1 F (36.7 C)] 97.5 F (36.4 C) (02/27 0800) Pulse Rate:  [58-102] 58 (02/27 0700) Resp:  [11-98] 15 (02/27 0200) BP: (76-164)/(48-88) 164/71 (02/27 0700) SpO2:  [98 %-100 %] 100 % (02/27 0700) Weight:  [52.2 kg-55.1 kg] 55.1 kg (02/27 0500)    General:   Frail, thinly built, chronically ill-appearing Head:  Normocephalic and atraumatic. Eyes: prominent pallor, no icterus Ears:  Normal auditory acuity. Nose:  No deformity, discharge,  or lesions. Mouth:  No deformity or lesions.  Oropharynx pink & moist. Neck:  Supple; no masses or thyromegaly. Lungs:  Clear throughout to auscultation.   No wheezes, crackles, or rhonchi. No acute distress. Heart:  Regular rate and rhythm; no murmurs, clicks, rubs,  or gallops. Extremities:  Without clubbing or edema. Neurologic:  Alert and  oriented x4;  grossly normal neurologically. Skin:  Intact without significant lesions or rashes. Psych:  Alert and cooperative. Normal mood and affect. Abdomen:  Soft, nontender  and nondistended. No masses, hepatosplenomegaly or hernias noted. Normal bowel sounds, without guarding, and without rebound.         Lab Results: Recent Labs    06/18/18 1301 06/18/18 1645 06/19/18 0823  WBC 2.2* 1.1* 1.3*  HGB 2.9* 2.6* 8.3*  HCT 9.4* 8.8* 24.4*  PLT 14* 17* 47*   BMET Recent Labs    06/18/18 1602 06/19/18 0823  NA 150* 151*  K 3.4* 3.2*  CL 116* 117*  CO2 23 23  GLUCOSE 134* 111*  BUN 65* 55*  CREATININE 1.43* 1.43*  CALCIUM 8.4* 8.2*   LFT Recent Labs    06/19/18 0823  PROT 5.0*  ALBUMIN 2.2*  AST 28  ALT 37  ALKPHOS 221*  BILITOT 1.5*   PT/INR Recent Labs    06/18/18 1602  LABPROT 13.8  INR 1.1    Studies/Results: No results found.  Impression: Severe symptomatic anemia in the setting of pancytopenia, likely due to underlying bone marrow fibrosis, mantle cell lymphoma and prostate cancer. BUN/Cr ratio elevated on admission at 65/1.43, history of small non bleeding gastric AVMs, was not on PPI at home As received 1 unit platelet transfusion and 4 units of PRBC transfusion Hemoglobin has increased to 8.3 and platelet count is 47 although he continues to be neutropenic, WBC count 1.3  Plan: Remains nothing by mouth, plan diagnostic EGD. May need small bowel PillCam if considered appropriate. Anemia likely related to underlying bone marrow fibrosis and pancytopenia Is continued on IV Protonix drip As per oncology evaluation, recommendation is for hospice upon discharge   LOS: 1 day   Ronnette Juniper, MD  06/19/2018, 11:31 AM  Pager [phone] If no answer or after 5 PM call [phone]

## 2018-06-19 NOTE — Plan of Care (Signed)
  Problem: Nutrition: Goal: Adequate nutrition will be maintained Outcome: Progressing   Problem: Pain Managment: Goal: General experience of comfort will improve Outcome: Progressing   Problem: Safety: Goal: Ability to remain free from injury will improve Outcome: Progressing   

## 2018-06-19 NOTE — Progress Notes (Signed)
PROGRESS NOTE    Joel [name]  XKG:[id number] DOB: 13-Feb-1941 DOA: 06/18/2018 PCP: [name], MD   Brief Narrative:77 y.o. 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  Per notes his oncologist have recommended hospice but at this point patient was still continued to receive supportive management with repeated transfusions. Today he went to his oncologist again and was noted to be severely anemic he has been having some dark stools.  No abdominal pain no fevers or chills.  Family reports decreased PO intake  Regarding pertinent Chronic problems:    status post excisional biopsy of a right neck mass completed on 03/27/2017 with the pathology confirming mantle cell lymphoma status post palliative radiation therapy to his cervical adenopathy completed in January 2019.  Therapy has been on hold because of pancytopenia since October 2019. Bone marrow failure currently has been on supportive management with blood and platelet transfusions his last transfusion was on 11 June 2018.  At the same time he also received 2 units of packed red blood cells overall by oncologist was told he has pleural prognosis.  Family reports 2-3 days of black stools but no blood in stools. 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  Continue Protonix drip.  Patient has history of AV malformations.  GI consult pending.  Repeat hemoglobin is 8.3.  #2 Mantle cell lymphoma with prostate cancer patient with very poor prognosis appreciate oncology input patient is DO NOT RESUSCITATE.  Will arrange for hospice follow-up on discharge.  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  #6 hyper natremia due to poor nutritional intake Change IV fluids to half-normal saline.  Hypernatremia has worsened to 151 today. Estimated body mass index is 19.03 kg/m as calculated from the following:   Height as of this encounter: 5\' 7"  (1.702 m).   Weight as of this encounter: 55.1 kg.  DVT prophylaxis: SCD Code Status: DO NOT RESUSCITATE Family Communication: Discussed with wife who was in the room Disposition Plan: Pending clinical improvement  Consultants:   Oncology  Procedures: None Antimicrobials: None  Subjective: Patient is awake alert denies any chest pain or shortness of breath  Objective: Chronically ill looking frail elderly male Vitals:   06/19/18 0605 06/19/18 0609 06/19/18 0700 06/19/18 0800  BP: 138/74  (!) 164/71   Pulse:  68 (!) 58   Resp:      Temp: (!) 97.1 F (36.2 C)   (!)  97.5 F (36.4 C)  TempSrc: Oral   Oral  SpO2:  100% 100%   Weight:      Height:        Intake/Output Summary (Last 24 hours) at 06/19/2018 0950 Last data filed at 06/19/2018 0555 Gross per 24 hour  Intake 2873.24 ml  Output 300 ml  Net 2573.24 ml   Filed Weights   06/18/18 1513 06/19/18 0500  Weight: 52.2 kg 55.1 kg    Examination: Oral mucosa dry General exam: Appears calm and comfortable  Respiratory  system: Clear to auscultation. Respiratory effort normal. Cardiovascular system: S1 & S2 heard, RRR. No JVD, murmurs, rubs, gallops or clicks. No pedal edema. Gastrointestinal system: Abdomen is nondistended, soft and nontender. No organomegaly or masses felt. Normal bowel sounds heard. Central nervous system: Alert and oriented. No focal neurological deficits. Extremities: Symmetric 5 x 5 power. Skin: No rashes, lesions or ulcers Psychiatry: Judgement and insight appear normal. Mood & affect appropriate.     Data Reviewed: I have personally reviewed following labs and imaging studies  CBC: Recent Labs  Lab 06/18/18 1301 06/18/18 1645 06/19/18 0823  WBC 2.2* 1.1* 1.3*  NEUTROABS 0.7* 0.6*  --   HGB 2.9* 2.6* 8.3*  HCT 9.4* 8.8* 24.4*  MCV 90.4 90.7 88.1  PLT 14* 17* 47*   Basic Metabolic Panel: Recent Labs  Lab 06/18/18 1602 06/18/18 2130 06/19/18 0823  NA 150*  --  151*  K 3.4*  --  3.2*  CL 116*  --  117*  CO2 23  --  23  GLUCOSE 134*  --  111*  BUN 65*  --  55*  CREATININE 1.43*  --  1.43*  CALCIUM 8.4*  --  8.2*  MG  --  2.2 2.1  PHOS  --  6.2* 5.8*   GFR: Estimated Creatinine Clearance: 34.8 mL/min (A) (by C-G formula based on SCr of 1.43 mg/dL (H)). Liver Function Tests: Recent Labs  Lab 06/18/18 1602 06/19/18 0823  AST 26 28  ALT 39 37  ALKPHOS 228* 221*  BILITOT 1.0 1.5*  PROT 4.8* 5.0*  ALBUMIN 2.2* 2.2*   No results for input(s): LIPASE, AMYLASE in the last 168 hours. No results for input(s): AMMONIA in the last 168 hours. Coagulation Profile: Recent Labs  Lab 06/18/18 1602  INR 1.1   Cardiac Enzymes: No results for input(s): CKTOTAL, CKMB, CKMBINDEX, TROPONINI in the last 168 hours. BNP (last 3 results) No results for input(s): PROBNP in the last 8760 hours. HbA1C: No results for input(s): HGBA1C in the last 72 hours. CBG: No results for input(s): GLUCAP in the last 168 hours. Lipid Profile: No results for input(s): CHOL, HDL,  LDLCALC, TRIG, CHOLHDL, LDLDIRECT in the last 72 hours. Thyroid Function Tests: No results for input(s): TSH, T4TOTAL, FREET4, T3FREE, THYROIDAB in the last 72 hours. Anemia Panel: No results for input(s): VITAMINB12, FOLATE, FERRITIN, TIBC, IRON, RETICCTPCT in the last 72 hours. Sepsis Labs: No results for input(s): PROCALCITON, LATICACIDVEN in the last 168 hours.  Recent Results (from the past 240 hour(s))  MRSA PCR Screening     Status: None   Collection Time: 06/19/18  2:46 AM  Result Value Ref Range Status   MRSA by PCR NEGATIVE NEGATIVE Final    Comment:        The GeneXpert MRSA Assay (FDA approved for NASAL specimens only), is one component of a comprehensive MRSA colonization surveillance program. It is not intended to diagnose MRSA infection nor to guide or monitor  treatment for MRSA infections. Performed at [facility], [place] [address] [place], [place] [address]          Radiology Studies: No results found.      Scheduled Meds: . sodium chloride   Intravenous Once  . Chlorhexidine Gluconate Cloth  6 each Topical Q0600  . thiamine injection  100 mg Intravenous Daily   Continuous Infusions: . sodium chloride 10 mL/hr at 06/18/18 2044  . dextrose 5 % and 0.45% NaCl 75 mL/hr at 06/19/18 0100  . pantoprozole (PROTONIX) infusion 8 mg/hr (06/19/18 0616)     LOS: 1 day     Georgette Shell, MD [facility]  If 7PM-7AM, please contact night-coverage [email or web address] Password [facility] 06/19/2018, 9:50 AM

## 2018-06-19 NOTE — Anesthesia Procedure Notes (Signed)
Procedure Name: MAC Date/Time: 06/19/2018 12:56 PM Performed by: Eben Burow, CRNA Pre-anesthesia Checklist: Patient identified, Emergency Drugs available, Suction available, Patient being monitored and Timeout performed Patient Re-evaluated:Patient Re-evaluated prior to induction Dental Injury: Teeth and Oropharynx as per pre-operative assessment

## 2018-06-19 NOTE — Anesthesia Postprocedure Evaluation (Signed)
Anesthesia Post Note  Patient: Joel [name]  Procedure(s) Performed: ESOPHAGOGASTRODUODENOSCOPY (EGD) WITH PROPOFOL (N/A )     Patient location during evaluation: PACU Anesthesia Type: MAC Level of consciousness: awake and alert Pain management: pain level controlled Vital Signs Assessment: post-procedure vital signs reviewed and stable Respiratory status: spontaneous breathing, nonlabored ventilation, respiratory function stable and patient connected to nasal cannula oxygen Cardiovascular status: stable and blood pressure returned to baseline Postop Assessment: no apparent nausea or vomiting Anesthetic complications: no    Last Vitals:  Vitals:   06/19/18 1325 06/19/18 1330  BP:  (!) 156/75  Pulse: 68 63  Resp: 17 10  Temp:    SpO2: 100% 100%    Last Pain:  Vitals:   06/19/18 1315  TempSrc: Oral  PainSc: 0-No pain                 Tiajuana Amass

## 2018-06-19 NOTE — Progress Notes (Signed)
CRITICAL VALUE ALERT  Critical Value:  WBC 1.4  Date & Time Notied:  2100  Provider Notified: B[place]heimer  Orders Received/Actions taken: None yet received. Will continue to monitor

## 2018-06-19 NOTE — Progress Notes (Signed)
CRITICAL VALUE ALERT  Critical Value:  WBC 1.3  Date & Time Notied:  06/19/2018  0847  Provider Notified: Dr Rodena Piety  Orders Received/Actions taken: Waiting for orders

## 2018-06-19 NOTE — H&P (View-Only) (Signed)
[facility] Consult  Referring Provider: Toy Baker, MD Primary Care Physician:  [name], MD Primary Gastroenterologist: Althia Forts  Reason for Consultation:  Severe anemia  HPI: Joel [name] is a 78 y.o. male with history of bone marrow fibrosis,mantle cell lymphoma, prostate cancer was hospitalized due to severe anemia, hemoglobin of 2.9 noted during outpatient lab(he is scheduled to get weekly labs and transfusion if needed). Patient states he has been having 1 bowel movement daily, stools are dark brown, denies seeing black stools or bloody bowel movements. He is not on a PPI or iron. He had an EGD on 06/03/2018 which showed one small nonbleeding AVM, treated with APC, and small superficial ulceration, prior site of APC, mild duodenal deformity. Colonoscopy from 06/03/2018 showed hemorrhoids and 5 tubular adenomas were removed. For the last few days patient reports worsening weakness and decreased appetite. He denies nausea, vomiting, heartburn, difficulty swallowing or pain on swallowing.    Past Medical History:  Diagnosis Date  . Anemia   . Aneurysm ([place])    in 1987, brain aneurysm  . Bilateral hydronephrosis   . History of radiation therapy 04/17/17- 05/07/17   Right neck treated to 32.5 Gy with 13 fx of 2.5 Gy  . mantle cell lymphoma dx'd 03/2017  . Mass of right side of neck   . Pneumonia    hx of   . Prostate CA ([place]) dx'd 2017  . Urinary retention     Past Surgical History:  Procedure Laterality Date  . BIOPSY  05/21/2018   Procedure: BIOPSY;  Surgeon: Milus Banister, MD;  Location: [facility];  Service: Gastroenterology;;  . COLONOSCOPY WITH PROPOFOL N/A 06/03/2018   Procedure: COLONOSCOPY WITH PROPOFOL;  Surgeon: Ronnette Juniper, MD;  Location: [facility];  Service: Gastroenterology;  Laterality: N/A;  . ESOPHAGOGASTRODUODENOSCOPY (EGD) WITH PROPOFOL N/A 05/21/2018   Procedure: ESOPHAGOGASTRODUODENOSCOPY (EGD) WITH PROPOFOL;  Surgeon:  Milus Banister, MD;  Location: [facility];  Service: Gastroenterology;  Laterality: N/A;  . ESOPHAGOGASTRODUODENOSCOPY (EGD) WITH PROPOFOL N/A 06/03/2018   Procedure: ESOPHAGOGASTRODUODENOSCOPY (EGD) WITH PROPOFOL;  Surgeon: Ronnette Juniper, MD;  Location: [facility];  Service: Gastroenterology;  Laterality: N/A;  . HOT HEMOSTASIS N/A 05/21/2018   Procedure: HOT HEMOSTASIS (ARGON PLASMA COAGULATION/BICAP);  Surgeon: Milus Banister, MD;  Location: [facility];  Service: Gastroenterology;  Laterality: N/A;  . HOT HEMOSTASIS N/A 06/03/2018   Procedure: HOT HEMOSTASIS (ARGON PLASMA COAGULATION/BICAP);  Surgeon: Ronnette Juniper, MD;  Location: [facility];  Service: Gastroenterology;  Laterality: N/A;  . MASS BIOPSY Right 03/27/2017   Procedure: OPEN NECK MASS BIOPSY OF THE RIGHT NECK;  Surgeon: Leta Baptist, MD;  Location: [facility];  Service: ENT;  Laterality: Right;  . POLYPECTOMY  06/03/2018   Procedure: POLYPECTOMY;  Surgeon: Ronnette Juniper, MD;  Location: [facility];  Service: Gastroenterology;;  . PROSTATE BIOPSY N/A 07/30/2015   Procedure: PROSTATE BIOPSY;  Surgeon: Irine Seal, MD;  Location: [facility];  Service: Urology;  Laterality: N/A;  . TRANSURETHRAL RESECTION OF PROSTATE N/A 07/30/2015   Procedure: TRANSURETHRAL RESECTION OF THE PROSTATE (TURP);  Surgeon: Irine Seal, MD;  Location: [facility];  Service: Urology;  Laterality: N/A;    Prior to Admission medications   Medication Sig Start Date End Date Taking? Authorizing Provider  acetaminophen (TYLENOL) 325 MG tablet Take 325 mg by mouth every 6 (six) hours as needed for mild pain or headache.   Yes [provider]  diphenhydrAMINE (BENADRYL) 25 mg capsule Take 25 mg by mouth every 6 (six) hours  as needed for sleep.   Yes [provider]  OVER THE COUNTER MEDICATION Take 1-2 drops by mouth 2 (two) times daily. Papaya drops (to help blood platelets)   Yes [provider]  feeding supplement, ENSURE ENLIVE, (ENSURE ENLIVE) LIQD Take  237 mLs by mouth 2 (two) times daily between meals. Patient not taking: Reported on 06/18/2018 03/22/18   Elodia Florence., MD  megestrol (MEGACE) 400 MG/10ML suspension Take 10 mLs (400 mg total) by mouth 2 (two) times daily. Patient not taking: Reported on 05/30/2018 03/10/18   Wyatt Portela, MD  pantoprazole (PROTONIX) 40 MG tablet Take 1 tablet (40 mg total) by mouth 2 (two) times daily for 30 days. Patient not taking: Reported on 06/10/2018 05/27/18 06/26/18  Terrilee Croak, MD  senna-docusate (SENOKOT-S) 8.6-50 MG tablet Take 1 tablet by mouth 2 (two) times daily for 30 days. Patient not taking: Reported on 05/30/2018 05/27/18 06/26/18  Terrilee Croak, MD    Current Facility-Administered Medications  Medication Dose Route Frequency Provider Last Rate Last Dose  . 0.45 % sodium chloride infusion   Intravenous Continuous Georgette Shell, MD 125 mL/hr at 06/19/18 1032    . 0.9 %  sodium chloride infusion (Manually program via Guardrails IV Fluids)   Intravenous Once Toy Baker, MD      . acetaminophen (TYLENOL) tablet 650 mg  650 mg Oral Q6H PRN Doutova, Anastassia, MD       Or  . acetaminophen (TYLENOL) suppository 650 mg  650 mg Rectal Q6H PRN Doutova, Anastassia, MD      . Chlorhexidine Gluconate Cloth 2 % PADS 6 each  6 each Topical Q0600 Doutova, Anastassia, MD      . dextrose 5 %-0.45 % sodium chloride infusion   Intravenous Continuous Toy Baker, MD   Stopped at 06/19/18 1024  . HYDROcodone-acetaminophen (NORCO/VICODIN) 5-325 MG per tablet 1-2 tablet  1-2 tablet Oral Q4H PRN Toy Baker, MD      . ondansetron (ZOFRAN) tablet 4 mg  4 mg Oral Q6H PRN Doutova, Anastassia, MD       Or  . ondansetron (ZOFRAN) injection 4 mg  4 mg Intravenous Q6H PRN Doutova, Anastassia, MD      . pantoprazole (PROTONIX) 80 mg in sodium chloride 0.9 % 250 mL (0.32 mg/mL) infusion  8 mg/hr Intravenous Continuous Doutova, Anastassia, MD 25 mL/hr at 06/19/18 1024 8 mg/hr at 06/19/18  1024  . thiamine (B-1) injection 100 mg  100 mg Intravenous Daily Doutova, Anastassia, MD   100 mg at 06/19/18 1022    Allergies as of 06/18/2018  . (No Known Allergies)    Family History  Problem Relation Age of Onset  . Dementia Mother   . Heart disease Father   . Lung cancer Brother   . Lupus Sister     Social History   Socioeconomic History  . Marital status: Married    Spouse name: Sallie  . Number of children: 1  . Years of education: 2  . Highest education level: Associate degree: occupational, Hotel manager, or vocational program  Occupational History    Comment: retired  Scientific laboratory technician  . Financial resource strain: Not on file  . Food insecurity:    Worry: Not on file    Inability: Not on file  . Transportation needs:    Medical: Not on file    Non-medical: Not on file  Tobacco Use  . Smoking status: Former Smoker    Last attempt to quit: 04/28/1985  Years since quitting: 33.1  . Smokeless tobacco: Former Systems developer    Types: Chew  . Tobacco comment: quit 30-40 yrs. aog  Substance and Sexual Activity  . Alcohol use: No    Alcohol/week: 0.0 standard drinks  . Drug use: No  . Sexual activity: Not on file  Lifestyle  . Physical activity:    Days per week: Not on file    Minutes per session: Not on file  . Stress: Not on file  Relationships  . Social connections:    Talks on phone: Not on file    Gets together: Not on file    Attends religious service: Not on file    Active member of club or organization: Not on file    Attends meetings of clubs or organizations: Not on file    Relationship status: Not on file  . Intimate partner violence:    Fear of current or ex partner: Not on file    Emotionally abused: Not on file    Physically abused: Not on file    Forced sexual activity: Not on file  Other Topics Concern  . Not on file  Social History Narrative   Lives with wife   Caffeine- not much    Review of Systems: Positive for: GI: Described in detail  in HPI.    Gen: anorexia, fatigue, weakness, malaise, Denies any fever, chills, rigors, night sweats, involuntary weight loss, and sleep disorder CV: Denies chest pain, angina, palpitations, syncope, orthopnea, PND, peripheral edema, and claudication. Resp: Denies dyspnea, cough, sputum, wheezing, coughing up blood. GU : Denies urinary burning, blood in urine, urinary frequency, urinary hesitancy, nocturnal urination, and urinary incontinence. MS: Denies joint pain or swelling.  Denies muscle weakness, cramps, atrophy.  Derm: Denies rash, itching, oral ulcerations, hives, unhealing ulcers.  Psych: Denies depression, anxiety, memory loss, suicidal ideation, hallucinations,  and confusion. Heme: Denies bruising, bleeding, and enlarged lymph nodes. Neuro:  Denies any headaches, dizziness, paresthesias. Endo:  Denies any problems with DM, thyroid, adrenal function.  Physical Exam: Vital signs in last 24 hours: Temp:  [97.1 F (36.2 C)-98.1 F (36.7 C)] 97.5 F (36.4 C) (02/27 0800) Pulse Rate:  [58-102] 58 (02/27 0700) Resp:  [11-98] 15 (02/27 0200) BP: (76-164)/(48-88) 164/71 (02/27 0700) SpO2:  [98 %-100 %] 100 % (02/27 0700) Weight:  [52.2 kg-55.1 kg] 55.1 kg (02/27 0500)    General:   Frail, thinly built, chronically ill-appearing Head:  Normocephalic and atraumatic. Eyes: prominent pallor, no icterus Ears:  Normal auditory acuity. Nose:  No deformity, discharge,  or lesions. Mouth:  No deformity or lesions.  Oropharynx pink & moist. Neck:  Supple; no masses or thyromegaly. Lungs:  Clear throughout to auscultation.   No wheezes, crackles, or rhonchi. No acute distress. Heart:  Regular rate and rhythm; no murmurs, clicks, rubs,  or gallops. Extremities:  Without clubbing or edema. Neurologic:  Alert and  oriented x4;  grossly normal neurologically. Skin:  Intact without significant lesions or rashes. Psych:  Alert and cooperative. Normal mood and affect. Abdomen:  Soft, nontender  and nondistended. No masses, hepatosplenomegaly or hernias noted. Normal bowel sounds, without guarding, and without rebound.         Lab Results: Recent Labs    06/18/18 1301 06/18/18 1645 06/19/18 0823  WBC 2.2* 1.1* 1.3*  HGB 2.9* 2.6* 8.3*  HCT 9.4* 8.8* 24.4*  PLT 14* 17* 47*   BMET Recent Labs    06/18/18 1602 06/19/18 0823  NA 150* 151*  K 3.4* 3.2*  CL 116* 117*  CO2 23 23  GLUCOSE 134* 111*  BUN 65* 55*  CREATININE 1.43* 1.43*  CALCIUM 8.4* 8.2*   LFT Recent Labs    06/19/18 0823  PROT 5.0*  ALBUMIN 2.2*  AST 28  ALT 37  ALKPHOS 221*  BILITOT 1.5*   PT/INR Recent Labs    06/18/18 1602  LABPROT 13.8  INR 1.1    Studies/Results: No results found.  Impression: Severe symptomatic anemia in the setting of pancytopenia, likely due to underlying bone marrow fibrosis, mantle cell lymphoma and prostate cancer. BUN/Cr ratio elevated on admission at 65/1.43, history of small non bleeding gastric AVMs, was not on PPI at home As received 1 unit platelet transfusion and 4 units of PRBC transfusion Hemoglobin has increased to 8.3 and platelet count is 47 although he continues to be neutropenic, WBC count 1.3  Plan: Remains nothing by mouth, plan diagnostic EGD. May need small bowel PillCam if considered appropriate. Anemia likely related to underlying bone marrow fibrosis and pancytopenia Is continued on IV Protonix drip As per oncology evaluation, recommendation is for hospice upon discharge   LOS: 1 day   Ronnette Juniper, MD  06/19/2018, 11:31 AM  Pager [phone] If no answer or after 5 PM call [phone]

## 2018-06-19 NOTE — Progress Notes (Signed)
Initial Nutrition Assessment  DOCUMENTATION CODES:   Severe malnutrition in context of chronic illness  INTERVENTION:  - Diet advancement as medically feasible. - Will order daily multivitamin with minerals. - Will order ONS with diet advancement.   NUTRITION DIAGNOSIS:   Severe Malnutrition related to chronic illness, cancer and cancer related treatments as evidenced by moderate fat depletion, severe fat depletion, moderate muscle depletion, severe muscle depletion, percent weight loss.  GOAL:   Patient will meet greater than or equal to 90% of their needs  MONITOR:   Diet advancement, Weight trends, Labs  REASON FOR ASSESSMENT:   Malnutrition Screening Tool, Consult Malnutrition Eval  ASSESSMENT:   78 y.o. male with medical history significant of advanced prostate cancer, mantle cell lymphoma, GBI, severe pancytopenia, brain aneurysm in 1987, bilateral hydronephrosis, and CVA. Patient presented to the ED with fatigue and looking pale for several days PTA. On the day of presentation he had went to his Oncologist's office and was noted to be severely anemic and reported having dark stools. Family reported decreased PO intakes.   BMI indicates normal weight. Patient has been NPO since admission. He went for EGD earlier today which showed no acive bleeding, ulcers, or bleeding AVMs. Inflammation was seen in duodenum. Op note states that family does not want to pursue any further invasive procedures.   Patient is alert and oriented but speaks very slowly and sometimes loses his train of thought. A family member was at bedside and reports that since October patient has had a decreased appetite although appetite fluctuations and sometimes he eats okay. Most recently, patient has been refusing foods prepared by family that he usually likes. He does not eat much meat at baseline and although he denies chewing difficulties, he often prefers softer foods. He denies any swallowing  difficulties or pain with swallowing and denies abdominal pain/pressure or nausea now or in the time PTA.   Patient and family reports that he was previously on medication for lymphoma with a long list of side effects and patient experienced many of these side effects, to include weight loss and poor appetite.   Current weight is 121 lb and weight in December 2019 was 132 lb. This indicates 11 lb weight loss (8% body weight) in the past 2 months; significant for time frame.     Medications reviewed; protonix at 8 mg/hr, 10 mEq IV KCl x2 runs 2/26, 100 mg IV thiamine/day. Labs reviewed; Na: 151 mmol/l, K: 3.2 mmol/l, Cl; 117 mmol/l, BUN: 55 mg/dl, creatinine: 1.43 mg/dl, Ca: 8.2 mg/dl, Phos: 5.8 mg/dl, Alk Phos elevated, GFR: 55 ml/min.  IVF; 1/2 NS @ 125 ml/hr.      NUTRITION - FOCUSED PHYSICAL EXAM:    Most Recent Value  Orbital Region  Mild depletion  Upper Arm Region  Severe depletion  Thoracic and Lumbar Region  Unable to assess  Buccal Region  Moderate depletion  Temple Region  Severe depletion  Clavicle Bone Region  Severe depletion  Clavicle and Acromion Bone Region  Severe depletion  Scapular Bone Region  Moderate depletion  Dorsal Hand  Moderate depletion  Patellar Region  Unable to assess  Anterior Thigh Region  Unable to assess  Posterior Calf Region  Unable to assess  Edema (RD Assessment)  Unable to assess  Hair  Reviewed  Eyes  Reviewed  Mouth  Reviewed  Skin  Reviewed  Nails  Reviewed       Diet Order:   Diet Order  Diet NPO time specified  Diet effective midnight              EDUCATION NEEDS:   Not appropriate for education at this time  Skin:  Skin Assessment: Reviewed RN Assessment  Last BM:  PTA/unknown  Height:   Ht Readings from Last 1 Encounters:  06/19/18 5' 7"  (1.702 m)    Weight:   Wt Readings from Last 1 Encounters:  06/19/18 55.1 kg    Ideal Body Weight:  67.27 kg  BMI:  Body mass index is 19.03  kg/m.  Estimated Nutritional Needs:   Kcal:  9373-4287 kcal  Protein:  95-110 grams  Fluid:  >/= 2 L/day     Jarome Matin, MS, RD, LDN, [facility] Inpatient Clinical Dietitian Pager # [phone] After hours/weekend pager # [phone]

## 2018-06-19 NOTE — Progress Notes (Signed)
IP PROGRESS NOTE  Subjective:   Joel [name] is known to me with history of prostate cancer and mantle cell lymphoma.  He was hospitalized overnight with severe anemia and weakness.  He was found to have hemoglobin of 2.6 platelets of 17 with dark stools.  He is receiving a packed red cell transfusion and fluid resuscitation which has improved his mentation and blood pressure.  Clinically he is more responsive and answering questions appropriately.  He has no complaints at this time.  Objective:  Vital signs in last 24 hours: Temp:  [97.1 F (36.2 C)-98.1 F (36.7 C)] 97.1 F (36.2 C) (02/27 0605) Pulse Rate:  [58-102] 58 (02/27 0700) Resp:  [11-98] 15 (02/27 0200) BP: (76-164)/(48-88) 164/71 (02/27 0700) SpO2:  [98 %-100 %] 100 % (02/27 0700) Weight:  [115 lb (52.2 kg)-121 lb 7.6 oz (55.1 kg)] 121 lb 7.6 oz (55.1 kg) (02/27 0500) Weight change:     Intake/Output from previous day: 02/26 0701 - 02/27 0700 In: 2873.2 [I.V.:784.1; Blood:1920.9; IV Piggyback:168.3] Out: 300  General: Alert, awake chronically ill-appearing. Head: Normocephalic atraumatic. Mouth: mucous membranes moist, pharynx normal without lesions Eyes: No scleral icterus.  Pupils are equal and round reactive to light. Resp: clear to auscultation bilaterally without rhonchi or wheezes or dullness to percussion. Cardio: regular rate and rhythm, S1, S2 normal, no murmur, click, rub or gallop GI: soft, non-tender; bowel sounds normal; no masses,  no organomegaly Musculoskeletal: No joint deformity or effusion. Neurological: No motor, sensory deficits.  Intact deep tendon reflexes. Skin: No rashes or lesions.    Lab Results: Recent Labs    06/18/18 1301 06/18/18 1645  WBC 2.2* 1.1*  HGB 2.9* 2.6*  HCT 9.4* 8.8*  PLT 14* 17*    BMET Recent Labs    06/18/18 1602  NA 150*  K 3.4*  CL 116*  CO2 23  GLUCOSE 134*  BUN 65*  CREATININE 1.43*  CALCIUM 8.4*    Studies/Results: No results  found.  Medications: I have reviewed the patient's current medications.  Assessment/Plan:  78 year old man with the following:  1.  Anemia: This is in the setting of ongoing GI bleed as well as marrow failure.  I agree with transfusion as you are doing.  2.  GI bleed: He has history of AVM which could be the source of his ongoing GI bleeding which is exacerbated by severe thrombocytopenia.  GI consult is pending.  3.  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  A bone marrow biopsy obtained in December 2019 showed marked marrow fibrosis prominent T-cell reaction with some involvement of mantle cell lymphoma that is rather mild.  This was discussed today with the patient and his wife again today in detail.  Etiology of his marrow fibrosis and failure remains unclear.  This could be related to ibrutinib versus other etiologies.  Regardless of the etiology, despite stopping ibrutinib his condition has continued to worsen and likely will not recover.  He has declined repeat bone marrow biopsy in the past.  4.  Prognosis and goals of care: This was discussed today in detail with the patient and his family.  He has 2 incurable malignancies with underlying bone marrow failure of unknown etiology.  All these factors make his prognosis very poor with limited life expectancy.  I have recommended continue supportive care as you are doing and I recommended against aggressive cardiac or respiratory resuscitation.  Would  recommend hospice involvement upon discharge.  All their questions were answered today.   25  minutes was spent with the patient face-to-face today.  More than 50% of time was dedicated to reviewing laboratory data, imaging studies, pathology report review and answering questions regarding future plan of care.    LOS: 1 day   Zola Button 06/19/2018, 7:52 AM

## 2018-06-19 NOTE — Interval H&P Note (Signed)
History and Physical Interval Note:  06/19/2018 12:41 PM  Joel [name]  has presented today for surgery, with the diagnosis of Anemia and dark stools.  The various methods of treatment have been discussed with the patient and family. After consideration of risks, benefits and other options for treatment, the patient has consented to  Procedure(s): ESOPHAGOGASTRODUODENOSCOPY (EGD) WITH PROPOFOL (N/A) as a surgical intervention .  The patient's history has been reviewed, patient examined, no change in status, stable for surgery.  I have reviewed the patient's chart and labs.  Questions were answered to the patient's satisfaction.    Patient's son had multiple questions which were answered appropriately in the endoscopy unit.  We did discuss that we do not have to do this EGD but given his dark stool and significant drop in hemoglobin, it may be reasonable to do endoscopy for treatment of possible AVMs.  He has mentioned that this would be his last procedure.  He did not want to pursue capsule endoscopy or any further evaluation.  Risk of anesthesia, risk of more bleeding given his low platelet count, risk of infection, perforation and other common risk for procedure discussed in detail again.  Risks (bleeding, infection, bowel perforation that could require surgery, sedation-related changes in cardiopulmonary systems), benefits (identification and possible treatment of source of symptoms, exclusion of certain causes of symptoms), and alternatives (watchful waiting, radiographic imaging studies, empiric medical treatment)  were explained to patient/family in detail and patient wishes to proceed.   Joel [name]

## 2018-06-19 NOTE — Transfer of Care (Signed)
Immediate Anesthesia Transfer of Care Note  Patient: Joel [name]  Procedure(s) Performed: ESOPHAGOGASTRODUODENOSCOPY (EGD) WITH PROPOFOL (N/A )  Patient Location: PACU and Endoscopy Unit  Anesthesia Type:MAC  Level of Consciousness: awake, alert  and drowsy  Airway & Oxygen Therapy: Patient Spontanous Breathing and Patient connected to nasal cannula oxygen  Post-op Assessment: Report given to RN and Post -op Vital signs reviewed and stable  Post vital signs: Reviewed and stable  Last Vitals:  Vitals Value Taken Time  BP    Temp    Pulse 66 06/19/2018  1:18 PM  Resp 19 06/19/2018  1:18 PM  SpO2 100 % 06/19/2018  1:18 PM  Vitals shown include unvalidated device data.  Last Pain:  Vitals:   06/19/18 1222  TempSrc: Oral  PainSc: 0-No pain         Complications: No apparent anesthesia complications

## 2018-06-19 NOTE — Progress Notes (Signed)
OT Cancellation Note  Patient Details Name: Joel [name] MRN: [id number] DOB: 05-27-1940   Cancelled Treatment:    Reason Eval/Treat Not Completed: Medical issues which prohibited therapy.  Pt with Hgb 2.6.  Noted plan is hospice. Will check back to see if pt would benefit from OT when stable.  Beulah Matusek 06/19/2018, 8:59 AM  Lesle Chris, OTR/L Acute Rehabilitation Services [phone] [facility] pager [phone] office 06/19/2018

## 2018-06-19 NOTE — Op Note (Signed)
[facility] Patient Name: Joel [name] Procedure Date: 06/19/2018 MRN: [id number] Attending MD: Otis Brace , MD Date of Birth: July 31, 1940 CSN: [id number] Age: 78 Admit Type: Inpatient Procedure:                Upper GI endoscopy Indications:              Iron deficiency anemia secondary to chronic blood                            loss, Melena Providers:                Otis Brace, MD, Carollee Sires, Technician Referring MD:              Medicines:                Sedation Administered by an Anesthesia Professional Complications:            No immediate complications. Estimated Blood Loss:     Estimated blood loss: none. Procedure:                Pre-Anesthesia Assessment:                           - Prior to the procedure, a History and Physical                            was performed, and patient medications and                            allergies were reviewed. The patient's tolerance of                            previous anesthesia was also reviewed. The risks                            and benefits of the procedure and the sedation                            options and risks were discussed with the patient.                            All questions were answered, and informed consent                            was obtained. Prior Anticoagulants: The patient has                            taken no previous anticoagulant or antiplatelet                            agents. ASA Grade Assessment: IV - A patient with  severe systemic disease that is a constant threat                            to life. After reviewing the risks and benefits,                            the patient was deemed in satisfactory condition to                            undergo the procedure.                           After obtaining informed consent, the endoscope was                            passed under  direct vision. Throughout the                            procedure, the patient's blood pressure, pulse, and                            oxygen saturations were monitored continuously. The                            [id number]-[id number] ([id number]) Olympus endoscope was introduced                            through the mouth, and advanced to the second part                            of duodenum. The upper GI endoscopy was                            accomplished without difficulty. The patient                            tolerated the procedure well. Scope In: Scope Out: Findings:      The Z-line was regular and was found 38 cm from the incisors.      The exam of the esophagus was otherwise normal.      Normal mucosa was found in the entire examined stomach.      The cardia and gastric fundus were normal on retroflexion.      Scattered mild mucosal changes characterized by erythema and petechiae       were found in the duodenal bulb. No evidence of active bleeding.       possibility of diffuse oozing from petechiae /inflammation from the       small bowel from his underlying thrombocytopenia cannot be ruled out.      The first portion of the duodenum and second portion of the duodenum       were normal. Impression:               - Z-line regular, 38 cm from the incisors.                           -  Normal mucosa was found in the entire stomach.                           - Mucosal changes in the duodenum.                           - Normal first portion of the duodenum and second                            portion of the duodenum.                           - No specimens collected. Moderate Sedation:      Moderate (conscious) sedation was personally administered by an       anesthesia professional. The following parameters were monitored: oxygen       saturation, heart rate, blood pressure, and response to care. Recommendation:           - Return patient to hospital ward for ongoing care.                            - Resume previous diet.                           - Continue present medications. Procedure Code(s):        --- Professional ---                           [phone], Esophagogastroduodenoscopy, flexible,                            transoral; diagnostic, including collection of                            specimen(s) by brushing or washing, when performed                            (separate procedure) Diagnosis Code(s):        --- Professional ---                           K31.89, Other diseases of stomach and duodenum                           D50.0, Iron deficiency anemia secondary to blood                            loss (chronic)                           K92.1, Melena (includes Hematochezia) CPT copyright 2018 [employer]. All rights reserved. The codes documented in this report are preliminary and upon coder review may  be revised to meet current compliance requirements. Otis Brace, MD Otis Brace, MD 06/19/2018 1:10:42 PM Number of Addenda: 0

## 2018-06-19 NOTE — Brief Op Note (Signed)
06/18/2018 - 06/19/2018  1:11 PM  PATIENT:  Joel [name]  78 y.o. male  PRE-OPERATIVE DIAGNOSIS:  Anemia  POST-OPERATIVE DIAGNOSIS:  duodenitis  PROCEDURE:  Procedure(s): ESOPHAGOGASTRODUODENOSCOPY (EGD) WITH PROPOFOL (N/A)  SURGEON:  Surgeon(s) and Role:    * Treson Laura, MD - Primary  Findings ---------- -EGD showed no evidence of active bleeding, ulcers or bleeding AVMs.   Inflammation and petechiae's were seen in the duodenal bulb.  possibility of diffuse oozing from petechiae /inflammation from  the small bowel from his underlying thrombocytopenia cannot be ruled out.   Recommendations ------------------------- -Recommend supportive care. - once a day PPI -Avoid NSAIDs. -Transfuse as needed to keep hemoglobin around 7-8. -  -Family does not want to pursue any further invasive procedures.  GI will sign off.  Call us back if needed  Otis Brace MD, [place] 06/19/2018, 1:13 PM  Contact #  [phone]

## 2018-06-19 NOTE — Progress Notes (Signed)
[facility] Progress Note   Joel [name] [id number] 1941/03/05 78 y.o.  Joel [name] is managed by Dr. Alen Blew  Actively treated with chemotherapy/immunotherapy/hormonal therapy: no  Next scheduled appointment with provider: 06/30/2018  Assessment: Plan:    Gastritis and gastroduodenitis  Anemia due to other cause, not classified   History of anemia due to blood loss in the setting of gastritis and gastroduodenitis: The patient presented to the cancer center today for a nonemergent transfusion of 2 units of packed red blood cells.  The patient was lethargic with intermittent unresponsiveness.  He was taken to the emergency room for evaluation and management.  He ultimately had a hemoglobin that returned at 2.9.  Please see After Visit Summary for patient specific instructions.  Future Appointments  Date Time Provider [place]  06/23/2018  7:45 AM [facility] LAB 5 [facility] None  06/23/2018  8:30 AM [facility] INFUSION [facility] None  06/23/2018  9:00 AM Ernestene Kiel L, RD [facility] None  06/30/2018  8:00 AM [facility] LAB 5 [facility] None  06/30/2018  8:30 AM Wyatt Portela, MD [facility] None  06/30/2018  9:15 AM [facility] INFUSION [facility] None  06/30/2018 10:30 AM Karie Mainland, RD [facility] None    No orders of the defined types were placed in this encounter.      Subjective:   Patient ID:  Joel [name] is a 78 y.o. (DOB Aug 19, 1940) male.  Chief Complaint: No chief complaint on file.   HPI Joel [name] is a 78 year old male who has a history of anemia due to blood loss in the setting of gastritis and gastroduodenitis. He presented to the cancer center today for a nonemergent transfusion of 2 units of packed red blood cells.  He was extremely lethargic with intermittent unresponsiveness.  He was taken to the emergency room for evaluation and management.  He ultimately had a hemoglobin that returned at 2.9.  His hemoglobin last  week returned at 6.9.  He was transfused with 2 units of packed red blood cells.  He is previously been admitted for profound anemia and has had ablations of bleeds in the stomach.  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  This was diagnosed in 2017.  He also has a history of a mantle cell lymphoma which was diagnosed in 2018.  He receives Lupron 30 mg once every 4 months with his last dose given on February 18, 2018.  His mantle cell lymphoma is managed with watchful waiting.  Medications: I have reviewed the patient's current medications.  Allergies: No Known Allergies  Past Medical History:  Diagnosis Date  . Anemia   . Aneurysm ([place])    in 1987, brain aneurysm  . Bilateral hydronephrosis   . History of radiation therapy 04/17/17- 05/07/17   Right neck treated to 32.5 Gy with 13 fx of 2.5 Gy  . mantle cell lymphoma dx'd 03/2017  . Mass of right side of neck   . Pneumonia    hx of   . Prostate CA ([place]) dx'd 2017  . Urinary retention     Past Surgical History:  Procedure Laterality Date  . BIOPSY  05/21/2018   Procedure: BIOPSY;  Surgeon: Milus Banister, MD;  Location: [facility];  Service: Gastroenterology;;  . COLONOSCOPY WITH PROPOFOL N/A 06/03/2018   Procedure: COLONOSCOPY WITH PROPOFOL;  Surgeon: Ronnette Juniper, MD;  Location: [facility];  Service: Gastroenterology;  Laterality: N/A;  . ESOPHAGOGASTRODUODENOSCOPY (EGD) WITH PROPOFOL N/A 05/21/2018  Procedure: ESOPHAGOGASTRODUODENOSCOPY (EGD) WITH PROPOFOL;  Surgeon: Milus Banister, MD;  Location: [facility];  Service: Gastroenterology;  Laterality: N/A;  . ESOPHAGOGASTRODUODENOSCOPY (EGD) WITH PROPOFOL N/A 06/03/2018   Procedure: ESOPHAGOGASTRODUODENOSCOPY (EGD) WITH PROPOFOL;  Surgeon: Ronnette Juniper, MD;  Location: [facility];  Service: Gastroenterology;  Laterality: N/A;  . HOT HEMOSTASIS N/A 05/21/2018   Procedure: HOT HEMOSTASIS (ARGON PLASMA COAGULATION/BICAP);   Surgeon: Milus Banister, MD;  Location: [facility];  Service: Gastroenterology;  Laterality: N/A;  . HOT HEMOSTASIS N/A 06/03/2018   Procedure: HOT HEMOSTASIS (ARGON PLASMA COAGULATION/BICAP);  Surgeon: Ronnette Juniper, MD;  Location: [facility];  Service: Gastroenterology;  Laterality: N/A;  . MASS BIOPSY Right 03/27/2017   Procedure: OPEN NECK MASS BIOPSY OF THE RIGHT NECK;  Surgeon: Leta Baptist, MD;  Location: [place];  Service: ENT;  Laterality: Right;  . POLYPECTOMY  06/03/2018   Procedure: POLYPECTOMY;  Surgeon: Ronnette Juniper, MD;  Location: [facility];  Service: Gastroenterology;;  . PROSTATE BIOPSY N/A 07/30/2015   Procedure: PROSTATE BIOPSY;  Surgeon: Irine Seal, MD;  Location: [facility];  Service: Urology;  Laterality: N/A;  . TRANSURETHRAL RESECTION OF PROSTATE N/A 07/30/2015   Procedure: TRANSURETHRAL RESECTION OF THE PROSTATE (TURP);  Surgeon: Irine Seal, MD;  Location: [facility];  Service: Urology;  Laterality: N/A;    Family History  Problem Relation Age of Onset  . Dementia Mother   . Heart disease Father   . Lung cancer Brother   . Lupus Sister     Social History   Socioeconomic History  . Marital status: Married    Spouse name: Sallie  . Number of children: 1  . Years of education: 19  . Highest education level: Associate degree: occupational, Hotel manager, or vocational program  Occupational History    Comment: retired  Scientific laboratory technician  . Financial resource strain: Not on file  . Food insecurity:    Worry: Not on file    Inability: Not on file  . Transportation needs:    Medical: Not on file    Non-medical: Not on file  Tobacco Use  . Smoking status: Former Smoker    Last attempt to quit: 04/28/1985    Years since quitting: 33.1  . Smokeless tobacco: Former Systems developer    Types: Chew  . Tobacco comment: quit 30-40 yrs. aog  Substance and Sexual Activity  . Alcohol use: No    Alcohol/week: 0.0 standard drinks  . Drug use: No  . Sexual activity: Not on file  Lifestyle  .  Physical activity:    Days per week: Not on file    Minutes per session: Not on file  . Stress: Not on file  Relationships  . Social connections:    Talks on phone: Not on file    Gets together: Not on file    Attends religious service: Not on file    Active member of club or organization: Not on file    Attends meetings of clubs or organizations: Not on file    Relationship status: Not on file  . Intimate partner violence:    Fear of current or ex partner: Not on file    Emotionally abused: Not on file    Physically abused: Not on file    Forced sexual activity: Not on file  Other Topics Concern  . Not on file  Social History Narrative   Lives with wife   Caffeine- not much    Past Medical History, Surgical history, Social history, and Family history were  reviewed and updated as appropriate.   Please see review of systems for further details on the patient's review from today.   Review of Systems:  Review of Systems  Constitutional: Positive for fatigue.  Skin: Positive for color change and pallor.  Neurological: Positive for dizziness and weakness.  Psychiatric/Behavioral: Positive for decreased concentration.    Objective:   Physical Exam:  There were no vitals taken for this visit. ECOG: 1  BP: 74/48 P: 68 SPO2: 100% on room air  Physical Exam Constitutional:      General: He is in acute distress.     Appearance: He is ill-appearing.     Comments: The patient is an elderly male who appears to be extremely lethargic and intermittently unresponsive.  HENT:     Head: Normocephalic and atraumatic.     Mouth/Throat:     Comments: Mucous membranes are pale. Cardiovascular:     Rate and Rhythm: Normal rate and regular rhythm.     Heart sounds: No murmur. No gallop.   Pulmonary:     Effort: Pulmonary effort is normal. No respiratory distress.     Breath sounds: Normal breath sounds. No wheezing or rales.  Skin:    General: Skin is dry.     Coloration: Skin is  pale.  Neurological:     Motor: Weakness present.  Psychiatric:     Comments: The patient is lethargic and intermittently unresponsive.  He is ambulating with use of a wheelchair.     Lab Review:     Component Value Date/Time   NA 151 (H) 06/19/2018 0823   NA 141 04/04/2017 1030   K 3.2 (L) 06/19/2018 0823   K 4.4 04/04/2017 1030   CL 117 (H) 06/19/2018 0823   CO2 23 06/19/2018 0823   CO2 20 (L) 04/04/2017 1030   GLUCOSE 111 (H) 06/19/2018 0823   GLUCOSE 97 04/04/2017 1030   BUN 55 (H) 06/19/2018 0823   BUN 29.2 (H) 04/04/2017 1030   CREATININE 1.43 (H) 06/19/2018 0823   CREATININE 1.32 (H) 05/16/2018 1131   CREATININE 1.7 (H) 04/04/2017 1030   CALCIUM 8.2 (L) 06/19/2018 0823   CALCIUM 8.8 04/04/2017 1030   PROT 5.0 (L) 06/19/2018 0823   PROT 7.7 04/12/2017 1423   PROT 8.0 04/04/2017 1030   ALBUMIN 2.2 (L) 06/19/2018 0823   ALBUMIN 3.2 (L) 04/04/2017 1030   AST 28 06/19/2018 0823   AST 17 05/16/2018 1131   AST 31 04/04/2017 1030   ALT 37 06/19/2018 0823   ALT 15 05/16/2018 1131   ALT 10 04/04/2017 1030   ALKPHOS 221 (H) 06/19/2018 0823   ALKPHOS 101 04/04/2017 1030   BILITOT 1.5 (H) 06/19/2018 0823   BILITOT 0.4 05/16/2018 1131   BILITOT 0.56 04/04/2017 1030   GFRNONAA 48 (L) 06/19/2018 0823   GFRNONAA 52 (L) 05/16/2018 1131   GFRNONAA 6 (L) 07/05/2015 1745   GFRAA 55 (L) 06/19/2018 0823   GFRAA >60 05/16/2018 1131   GFRAA 7 (L) 07/05/2015 1745       Component Value Date/Time   WBC 1.3 (LL) 06/19/2018 0823   RBC 2.77 (L) 06/19/2018 0823   HGB 8.3 (L) 06/19/2018 0823   HGB 2.9 (LL) 06/18/2018 1301   HGB 7.2 (L) 04/12/2017 1423   HCT 24.4 (L) 06/19/2018 0823   HCT 22.3 (L) 04/12/2017 1423   PLT 47 (L) 06/19/2018 0823   PLT 14 (L) 06/18/2018 1301   PLT 79 (L) 04/12/2017 1423   PLT 119 (L) 02/25/2017  1442   MCV 88.1 06/19/2018 0823   MCV 89.8 04/12/2017 1423   MCH 30.0 06/19/2018 0823   MCHC 34.0 06/19/2018 0823   RDW 13.2 06/19/2018 0823   RDW 21.0  (H) 04/12/2017 1423   LYMPHSABS 0.4 (L) 06/18/2018 1645   LYMPHSABS 6.5 (H) 02/25/2017 1442   MONOABS 0.1 06/18/2018 1645   EOSABS 0.0 06/18/2018 1645   EOSABS 0.1 02/25/2017 1442   BASOSABS 0.0 06/18/2018 1645   BASOSABS 0.0 02/25/2017 1442   -------------------------------  Imaging from last 24 hours (if applicable):  Radiology interpretation: Nm Gi Blood Loss  Result Date: 06/02/2018 CLINICAL DATA:  Anemia with bloody stools.  Undergoing transfusion. EXAM: NUCLEAR MEDICINE GASTROINTESTINAL BLEEDING SCAN TECHNIQUE: Sequential abdominal images were obtained following intravenous administration of Tc-36m labeled red blood cells. RADIOPHARMACEUTICALS:  23.0 mCi Tc-42m pertechnetate in-vitro labeled red cells. COMPARISON:  Abdominopelvic CT 02/18/2018. FINDINGS: Through 2 hours of imaging, no gastrointestinal bleeding is identified. There is a good red cell tag with normal blood pool activity in the liver, spleen, genitalia and vasculature. Progressive bladder activity noted. IMPRESSION: No evidence of active gastrointestinal bleeding. Electronically Signed   By: Richardean Sale M.D.   On: 06/02/2018 17:10   Dg Chest Port 1 View  Result Date: 05/25/2018 CLINICAL DATA:  CHF EXAM: PORTABLE CHEST 1 VIEW COMPARISON:  05/17/2018 FINDINGS: No focal consolidation or frank interstitial edema. Mild scarring in the medial right upper lobe, left perihilar region, and right infrahilar region. No pleural effusion or pneumothorax. The heart is top-normal in size. IMPRESSION: No evidence of acute cardiopulmonary disease. Electronically Signed   By: Julian Hy M.D.   On: 05/25/2018 04:57        This case was discussed Dr. Alen Blew. He expressed agreement with my management of this patient.

## 2018-06-19 NOTE — Progress Notes (Signed)
Spoke with Dr. Roel Cluck to clarify PRBC and PLT orders. Dr. Roel Cluck states to hold the PLT's for tonight as the  patient received one unit of PLT'S and one unit of PRBC's  in the emergency room.  Transfuse a total of 3 units of PRBC;  then recheck patients H&H.

## 2018-06-19 NOTE — Anesthesia Preprocedure Evaluation (Signed)
Anesthesia Evaluation  Patient identified by MRN, date of birth, ID band Patient awake    Reviewed: Allergy & Precautions, NPO status , Patient's Chart, lab work & pertinent test results  Airway Mallampati: I  TM Distance: >3 FB Neck ROM: Full    Dental   Pulmonary former smoker,    Pulmonary exam normal        Cardiovascular negative cardio ROS Normal cardiovascular exam     Neuro/Psych CVA    GI/Hepatic negative GI ROS, Neg liver ROS,   Endo/Other  negative endocrine ROS  Renal/GU Renal disease     Musculoskeletal   Abdominal   Peds  Hematology  (+) Blood dyscrasia (Pancytopenia), anemia ,   Anesthesia Other Findings   Reproductive/Obstetrics                             Anesthesia Physical  Anesthesia Plan  ASA: IV  Anesthesia Plan: MAC   Post-op Pain Management:    Induction: Intravenous  PONV Risk Score and Plan: 1  Airway Management Planned: Simple Face Mask and Natural Airway  Additional Equipment:   Intra-op Plan:   Post-operative Plan:   Informed Consent: I have reviewed the patients History and Physical, chart, labs and discussed the procedure including the risks, benefits and alternatives for the proposed anesthesia with the patient or authorized representative who has indicated his/her understanding and acceptance.   Patient has DNR.  Discussed DNR with patient and Suspend DNR.     Plan Discussed with: CRNA and Surgeon  Anesthesia Plan Comments:         Anesthesia Quick Evaluation

## 2018-06-20 ENCOUNTER — Encounter (HOSPITAL_COMMUNITY): Payer: Self-pay | Admitting: Gastroenterology

## 2018-06-20 LAB — COMPREHENSIVE METABOLIC PANEL
ALT: 30 U/L (ref 0–44)
AST: 29 U/L (ref 15–41)
Albumin: 2.1 g/dL — ABNORMAL LOW (ref 3.5–5.0)
Alkaline Phosphatase: 225 U/L — ABNORMAL HIGH (ref 38–126)
Anion gap: 10 (ref 5–15)
BUN: 40 mg/dL — ABNORMAL HIGH (ref 8–23)
CO2: 20 mmol/L — ABNORMAL LOW (ref 22–32)
CREATININE: 1.31 mg/dL — AB (ref 0.61–1.24)
Calcium: 7.6 mg/dL — ABNORMAL LOW (ref 8.9–10.3)
Chloride: 118 mmol/L — ABNORMAL HIGH (ref 98–111)
GFR calc Af Amer: >60 mL/min (ref >60)
GFR calc non Af Amer: 53 mL/min — ABNORMAL LOW (ref >60)
Glucose, Bld: 95 mg/dL (ref 70–99)
Potassium: 3.2 mmol/L — ABNORMAL LOW (ref 3.5–5.1)
Sodium: 148 mmol/L — ABNORMAL HIGH (ref 135–145)
Total Bilirubin: 1.3 mg/dL — ABNORMAL HIGH (ref 0.3–1.2)
Total Protein: 4.7 g/dL — ABNORMAL LOW (ref 6.5–8.1)

## 2018-06-20 LAB — CBC
HCT: 23.9 % — ABNORMAL LOW (ref 39.0–52.0)
Hemoglobin: 7.8 g/dL — ABNORMAL LOW (ref 13.0–17.0)
MCH: 29.2 pg (ref 26.0–34.0)
MCHC: 32.6 g/dL (ref 30.0–36.0)
MCV: 89.5 fL (ref 80.0–100.0)
Platelets: 41 10*3/uL — ABNORMAL LOW (ref 150–400)
RBC: 2.67 MIL/uL — AB (ref 4.22–5.81)
RDW: 14.3 % (ref 11.5–15.5)
WBC: 1.3 10*3/uL — CL (ref 4.0–10.5)
nRBC: 9 % — ABNORMAL HIGH (ref 0.0–0.2)

## 2018-06-20 LAB — PREPARE RBC (CROSSMATCH)

## 2018-06-20 MED ORDER — PANTOPRAZOLE SODIUM 40 MG PO TBEC: 40.0000 mg | DELAYED_RELEASE_TABLET | Freq: Every day | ORAL | 1 refills | Status: AC

## 2018-06-20 MED ORDER — FERROUS SULFATE DRIED ER 160 (50 FE) MG PO TBCR: 160.0000 mg | EXTENDED_RELEASE_TABLET | Freq: Every day | ORAL | 0 refills | Status: AC

## 2018-06-20 MED ORDER — SODIUM CHLORIDE 0.9% IV SOLUTION
Freq: Once | INTRAVENOUS | Status: AC
  Administered 2018-06-20: 16:00:00 via INTRAVENOUS

## 2018-06-20 MED ORDER — RAMELTEON 8 MG PO TABS
8.0000 mg | ORAL_TABLET | Freq: Every evening | ORAL | Status: DC | PRN
  Filled 2018-06-20: qty 1

## 2018-06-20 MED ORDER — SODIUM CHLORIDE 0.9% IV SOLUTION
Freq: Once | INTRAVENOUS | Status: AC
  Administered 2018-06-20: 08:00:00 via INTRAVENOUS

## 2018-06-20 NOTE — Progress Notes (Signed)
Pt's spouse is afraid pt's hemoglobin will drop if pt is d/c'd today without a blood transfusion. Dr Zigmund Daniel at beside and asked this RN to call Dr Alen Blew to call spouse. Spoke with Dr Hazeline Junker RN and she stated he would call pt's spouse in the room.

## 2018-06-20 NOTE — Evaluation (Signed)
Physical Therapy Evaluation Patient Details Name: Joel [name] MRN: [id number] DOB: [birth year] Today's Date: 06/20/2018   History of Present Illness  Pt was admitted with GIB.  Hgb 2.6 on admission.  H/o mantle cell lymphoma and prostate CA  Clinical Impression  The patient  Is very weak and required encouragement from wife to ambulate. Patient did make x 60'. Plans Dc today. Has HHPT on board.    Follow Up Recommendations Home health PT    Equipment Recommendations    none   Recommendations for Other Services       Precautions / Restrictions Precautions Precautions: Fall      Mobility  Bed Mobility Overal bed mobility: Needs Assistance Bed Mobility: Supine to Sit     Supine to sit: Mod assist     General bed mobility comments: encouragement to mobilize, patient initiated maving legs but did require assistance to fully sit up and slide to bed edge.  Transfers Overall transfer level: Needs assistance Equipment used: Rolling walker (2 wheeled) Transfers: Sit to/from Stand Sit to Stand: Min assist;+2 safety/equipment;Mod assist         General transfer comment: cues to rise ans steady assist to rise from bed, to reach back to recliner.  Ambulation/Gait Ambulation/Gait assistance: Min assist;+2 safety/equipment Gait Distance (Feet): 60 Feet Assistive device: Rolling walker (2 wheeled) Gait Pattern/deviations: Step-to pattern;Step-through pattern;Trunk flexed;Shuffle     General Gait Details: patient encouraged to ambulate   with RW. HR 99, Sats 99%. pastient is weak but did make the distance  Stairs            Wheelchair Mobility    Modified Rankin (Stroke Patients Only)       Balance                                             Pertinent Vitals/Pain Pain Assessment: No/denies pain Pain Location: edema in R>L hands and feet. "feels tight" Pain Intervention(s): Monitored during session    Home Living Family/patient expects  to be discharged to:: Private residence Living Arrangements: Spouse/significant other Available Help at Discharge: Family;Available 24 hours/day Type of Home: House Home Access: Stairs to enter Entrance Stairs-Rails: None Entrance Stairs-Number of Steps: 3 Home Layout: One level Home Equipment: Bedside commode;Shower seat - built in;Walker - 2 wheels(Simultaneous filing. User may not have seen previous data.)      Prior Function Level of Independence: Needs assistance   Gait / Transfers Assistance Needed: use of walker at home.   ADL's / Homemaking Assistance Needed: assist for adls by wife(Simultaneous filing. User may not have seen previous data.)        Hand Dominance        Extremity/Trunk Assessment   Upper Extremity Assessment Upper Extremity Assessment: Generalized weakness(bil edema in hands, R >)    Lower Extremity Assessment Lower Extremity Assessment: Generalized weakness    Cervical / Trunk Assessment Cervical / Trunk Assessment: Kyphotic  Communication   Communication: No difficulties(little verbalization Simultaneous filing. User may not have seen previous data.)  Cognition Arousal/Alertness: Awake/alert Behavior During Therapy: Flat affect Overall Cognitive Status: Impaired/Different from baseline Area of Impairment: Memory;Safety/judgement;Problem solving                             Problem Solving: Slow processing;Difficulty sequencing General Comments: pt with poor initiation, much encouragenment  to participate.      General Comments      Exercises     Assessment/Plan    PT Assessment All further PT needs can be met in the next venue of care  PT Problem List Decreased strength;Decreased activity tolerance;Decreased mobility;Decreased knowledge of precautions;Decreased safety awareness;Decreased knowledge of use of DME;Decreased cognition       PT Treatment Interventions DME instruction;Gait training;Functional mobility  training;Therapeutic activities;Patient/family education    PT Goals (Current goals can be found in the Care Plan section)  Acute Rehab PT Goals Patient Stated Goal: to wait til tomorrow when daughter is here PT Goal Formulation: All assessment and education complete, DC therapy    Frequency     Barriers to discharge        Co-evaluation PT/OT/SLP Co-Evaluation/Treatment: Yes Reason for Co-Treatment: For patient/therapist safety PT goals addressed during session: Mobility/safety with mobility OT goals addressed during session: Strengthening/ROM       AM-PAC PT "6 Clicks" Mobility  Outcome Measure Help needed turning from your back to your side while in a flat bed without using bedrails?: A Lot Help needed moving from lying on your back to sitting on the side of a flat bed without using bedrails?: A Lot Help needed moving to and from a bed to a chair (including a wheelchair)?: A Lot Help needed standing up from a chair using your arms (e.g., wheelchair or bedside chair)?: A Lot Help needed to walk in hospital room?: A Lot Help needed climbing 3-5 steps with a railing? : Total 6 Click Score: 11    End of Session Equipment Utilized During Treatment: Gait belt Activity Tolerance: Patient limited by fatigue Patient left: in chair;with call bell/phone within reach;with family/visitor present;with nursing/sitter in room Nurse Communication: Mobility status PT Visit Diagnosis: Unsteadiness on feet (R26.81)    Time: 1660-6301 PT Time Calculation (min) (ACUTE ONLY): 20 min   Charges:   PT Evaluation $PT Eval Low Complexity: 1 Low          [place] Pager [phone] Office [phone]   Claretha Cooper 06/20/2018, 1:37 PM

## 2018-06-20 NOTE — Discharge Summary (Signed)
Physician Discharge Summary  Ramsey Guadamuz EHM:[id number] DOB: 03/18/1941 DOA: 06/18/2018  PCP: [name], MD  Admit date: 06/18/2018 Discharge date: 06/20/2018  Admitted From: Home Disposition: Home  Recommendations for Outpatient Follow-up:  1. Follow up with PCP in 1-2 weeks 2. Please obtain BMP/CBC in one week 3. Please follow up with Dr. 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 Per notes his oncologist have recommended hospice but at this point patient was still continued to receive supportive management with repeated transfusions. Today he went to his oncologist again and was noted to be severely anemic he has been having some dark stools. No abdominal pain no fevers or chills.  Family reports decreased PO intake  Regarding pertinent Chronic problems: status post excisional biopsy of a right neck mass completed on 03/27/2017 with the pathology confirming mantle cell lymphoma status post palliative radiation therapy to his cervical adenopathy completed in January 2019.  Therapy has been on hold because of pancytopenia since October 2019. Bone marrow failure currently has been on supportive management with blood and platelet transfusions his last transfusion was on 11 June 2018. At the same time he also received 2 units of packed red blood cells overall by oncologist was told he has pOOR  prognosis.  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  Patient was treated with Protonix drip.   Had EGD on 06/19/2018 which showed no evidence of active bleeding ulcers or bleeding AVMs.  Inflammation and petechiae were seen in the duodenal bulb.   #2 Mantle cell lymphoma with prostate cancer patient with very poor prognosis appreciate oncology input patient is DO NOT RESUSCITATE.  Will arrange for hospice follow-up on discharge.  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 intake patient does not want to take Megace due to the fact it makes him nauseous.  #5 AKI resolved with IV hydration.  #6 hyper natremia due to poor nutritional intake.  Sodium improved to 148 on discharge.     Nutrition Problem: Severe Malnutrition Etiology: chronic illness, cancer and cancer related treatments    Signs/Symptoms: moderate fat depletion, severe fat depletion, moderate muscle depletion, severe muscle depletion, percent weight loss Percent weight loss: 8 %     Interventions: Refer to RD note for recommendations  Estimated body mass index is 19.03 kg/m as calculated from the following:   Height as of this encounter: 5\' 7"  (1.702 m).   Weight as of this encounter: 55.1 kg.  Discharge Instructions  Discharge Instructions    Call MD for:  difficulty breathing, headache or visual disturbances   Complete by:  As directed    Call MD for:  difficulty breathing, headache  or visual disturbances   Complete by:  As directed    Call MD for:  persistant dizziness or light-headedness   Complete by:  As directed    Call MD for:  persistant nausea and vomiting   Complete by:  As directed    Call MD for:  severe uncontrolled pain   Complete by:  As directed    Diet - low sodium heart healthy   Complete by:  As directed    Diet - low sodium heart healthy   Complete by:  As directed    Increase activity slowly   Complete by:  As directed    Increase activity slowly   Complete by:  As directed      Allergies as of 06/20/2018   No Known Allergies     Medication List    STOP taking these medications   diphenhydrAMINE 25 mg capsule Commonly known as:  BENADRYL   feeding supplement (ENSURE ENLIVE) Liqd   megestrol 400 MG/10ML suspension Commonly known as:  MEGACE   senna-docusate 8.6-50 MG tablet Commonly known as:  Senokot-S     TAKE these medications   acetaminophen 325 MG tablet Commonly known as:  TYLENOL Take 325 mg by mouth every 6 (six) hours as needed for mild pain or headache.   ferrous sulfate 160 (50 Fe) MG Tbcr SR tablet Commonly known as:  SLOW FE Take 1 tablet (160 mg total) by mouth daily.   OVER THE COUNTER MEDICATION Take 1-2 drops by mouth 2 (two) times daily. Papaya drops (to help blood platelets)   pantoprazole 40 MG tablet Commonly known as:  PROTONIX Take 1 tablet (40 mg total) by mouth daily. What changed:  when to take this      Follow-up Information    [name], MD Follow up.   Specialty:  Nephrology Contact information: [place] [address] [phone]        Wyatt Portela, MD Follow up.   Specialty:  Oncology Contact information: [place] [address] [phone]          No Known Allergies  Consultations:  Oncology   Procedures/Studies: Nm Gi Blood Loss  Result Date: 06/02/2018 CLINICAL DATA:  Anemia with bloody stools.  Undergoing  transfusion. EXAM: NUCLEAR MEDICINE GASTROINTESTINAL BLEEDING SCAN TECHNIQUE: Sequential abdominal images were obtained following intravenous administration of Tc-20m labeled red blood cells. RADIOPHARMACEUTICALS:  23.0 mCi Tc-13m pertechnetate in-vitro labeled red cells. COMPARISON:  Abdominopelvic CT 02/18/2018. FINDINGS: Through 2 hours of imaging, no gastrointestinal bleeding is identified. There is a good red cell tag with normal blood pool activity in the liver, spleen, genitalia and vasculature. Progressive bladder activity noted. IMPRESSION: No evidence of active gastrointestinal bleeding. Electronically Signed   By: Richardean Sale M.D.   On: 06/02/2018 17:10   Dg Chest Port 1 View  Result Date: 05/25/2018 CLINICAL DATA:  CHF EXAM: PORTABLE CHEST 1 VIEW COMPARISON:  05/17/2018 FINDINGS: No focal consolidation or frank interstitial edema. Mild scarring in the medial right upper lobe, left perihilar region, and right infrahilar region. No pleural effusion or pneumothorax. The heart is top-normal in size. IMPRESSION: No evidence of acute cardiopulmonary disease. Electronically Signed   By: Julian Hy M.D.   On: 05/25/2018 04:57    (Echo, Carotid, EGD,  Colonoscopy, ERCP)    Subjective: Patient resting in bed awake alert in no acute distress  Discharge Exam: Vitals:   06/20/18 0900 06/20/18 1030  BP: (!) 150/80 (!) 172/80  Pulse:  66  Resp:  12  Temp:  98.3 F (36.8 C)  SpO2:  99%   Vitals:   06/20/18 0800 06/20/18 0811 06/20/18 0900 06/20/18 1030  BP:  (!) 168/84 (!) 150/80 (!) 172/80  Pulse:  70  66  Resp:  13  12  Temp: 97.8 F (36.6 C) 98.2 F (36.8 C)  98.3 F (36.8 C)  TempSrc: Oral Axillary  Oral  SpO2:  97%  99%  Weight:      Height:        General: Pt is alert, awake, not in acute distress Cardiovascular: RRR, S1/S2 +, no rubs, no gallops Respiratory: CTA bilaterally, no wheezing, no rhonchi Abdominal: Soft, NT, ND, bowel sounds + Extremities: no edema, no  cyanosis    The results of significant diagnostics from this hospitalization (including imaging, microbiology, ancillary and laboratory) are listed below for reference.     Microbiology: Recent Results (from the past 240 hour(s))  MRSA PCR Screening     Status: None   Collection Time: 06/19/18  2:46 AM  Result Value Ref Range Status   MRSA by PCR NEGATIVE NEGATIVE Final    Comment:        The GeneXpert MRSA Assay (FDA approved for NASAL specimens only), is one component of a comprehensive MRSA colonization surveillance program. It is not intended to diagnose MRSA infection nor to guide or monitor treatment for MRSA infections. Performed at [facility], [place] [address] [place], [place] [address]      Labs: BNP (last 3 results) No results for input(s): BNP in the last 8760 hours. Basic Metabolic Panel: Recent Labs  Lab 06/18/18 1602 06/18/18 2130 06/19/18 0823 06/19/18 1431 06/20/18 0307  NA 150*  --  151* 152* 148*  K 3.4*  --  3.2* 3.1* 3.2*  CL 116*  --  117* 120* 118*  CO2 23  --  23 23 20*  GLUCOSE 134*  --  111* 104* 95  BUN 65*  --  55* 52* 40*  CREATININE 1.43*  --  1.43* 1.36* 1.31*  CALCIUM 8.4*  --  8.2* 8.1* 7.6*  MG  --  2.2 2.1  --   --   PHOS  --  6.2* 5.8*  --   --    Liver Function Tests: Recent Labs  Lab 06/18/18 1602 06/19/18 0823 06/20/18 0307  AST 26 28 29   ALT 39 37 30  ALKPHOS 228* 221* 225*  BILITOT 1.0 1.5* 1.3*  PROT 4.8* 5.0* 4.7*  ALBUMIN 2.2* 2.2* 2.1*   No results for input(s): LIPASE, AMYLASE in the last 168 hours. No results for input(s): AMMONIA in the last 168 hours. CBC: Recent Labs  Lab 06/18/18 1301 06/18/18 1645 06/19/18 0823 06/19/18 1431 06/19/18 2005 06/20/18 0307  WBC 2.2* 1.1* 1.3* 1.8* 1.4* 1.3*  NEUTROABS 0.7* 0.6*  --   --   --   --   HGB 2.9* 2.6* 8.3* 8.3* 8.3* 7.8*  HCT 9.4* 8.8* 24.4* 25.3* 25.2* 23.9*  MCV 90.4 90.7 88.1 89.7 90.0 89.5  PLT 14* 17* 47* 47* 48* 41*    Cardiac Enzymes: No results for input(s): CKTOTAL, CKMB, CKMBINDEX, TROPONINI in the last 168 hours. BNP: Invalid input(s): POCBNP CBG: No results for input(s): GLUCAP in the last 168 hours.  D-Dimer No results for input(s): DDIMER in the last 72 hours. Hgb A1c No results for input(s): HGBA1C in the last 72 hours. Lipid Profile No results for input(s): CHOL, HDL, LDLCALC, TRIG, CHOLHDL, LDLDIRECT in the last 72 hours. Thyroid function studies Recent Labs    06/19/18 1431  TSH 4.334   Anemia work up No results for input(s): VITAMINB12, FOLATE, FERRITIN, TIBC, IRON, RETICCTPCT in the last 72 hours. Urinalysis    Component Value Date/Time   COLORURINE YELLOW 05/17/2018 0757   APPEARANCEUR HAZY (A) 05/17/2018 0757   LABSPEC 1.013 05/17/2018 0757   PHURINE 6.0 05/17/2018 0757   GLUCOSEU NEGATIVE 05/17/2018 0757   HGBUR NEGATIVE 05/17/2018 0757   BILIRUBINUR NEGATIVE 05/17/2018 0757   BILIRUBINUR negative 07/05/2015 1801   KETONESUR NEGATIVE 05/17/2018 0757   PROTEINUR NEGATIVE 05/17/2018 0757   UROBILINOGEN 0.2 07/05/2015 1801   NITRITE NEGATIVE 05/17/2018 0757   LEUKOCYTESUR LARGE (A) 05/17/2018 0757   Sepsis Labs Invalid input(s): PROCALCITONIN,  WBC,  LACTICIDVEN Microbiology Recent Results (from the past 240 hour(s))  MRSA PCR Screening     Status: None   Collection Time: 06/19/18  2:46 AM  Result Value Ref Range Status   MRSA by PCR NEGATIVE NEGATIVE Final    Comment:        The GeneXpert MRSA Assay (FDA approved for NASAL specimens only), is one component of a comprehensive MRSA colonization surveillance program. It is not intended to diagnose MRSA infection nor to guide or monitor treatment for MRSA infections. Performed at [facility] [place] [address] [place], [place] [address]      Time coordinating discharge: 33 minutes  SIGNED:   Georgette Shell, MD  [facility] 06/20/2018, 11:24 AM Pager   If 7PM-7AM, please  contact night-coverage [email or web address] Password [email or web address]

## 2018-06-20 NOTE — Care Management Note (Signed)
Case Management Note  Patient Details  Name: Augusta Hilbert MRN: [id number] Date of Birth: 1940-08-25  Subjective/Objective:                    Action/Plan: Pt discharging home with [facility]   Expected Discharge Date:  06/20/18               Expected Discharge Plan:  [place]  In-House Referral:     Discharge planning Services  CM Consult  Post Acute Care Choice:    Choice offered to:  Patient, Spouse  DME Arranged:    DME Agency:     HH Arranged:  RN, PT [place] Agency:  [place]  Status of Service:  Completed, signed off  If discussed at [place] of Stay Meetings, dates discussed:    Additional CommentsPurcell Mouton, RN 06/20/2018, 12:21 PM

## 2018-06-20 NOTE — Progress Notes (Signed)
I was a contacted by the patient's wife concerned about his potential discharge with a hemoglobin of 7.8.  He has appointment at the cancer center on March 2 to receive 2 units of packed red cells.  I have no objections to transfuse 1 unit of packed red cells prior to discharge to ensure he will not become severely symptomatic over the weekend.

## 2018-06-20 NOTE — Evaluation (Addendum)
Occupational Therapy Evaluation Patient Details Name: Joel [name] MRN: [id number] DOB: [birth year] Today's Date: 06/20/2018    History of Present Illness Pt was admitted with GIB.  Hgb 2.6 on admission.  H/o mantle cell lymphoma and prostate CA   Clinical Impression   This 78 year old man was admitted for the above.  Wife has been helping extensively with adls since last admission. Pt was able to get up and walk to bathroom with her nearby.  With encouragement, performed bed mobility with mod A with HOB raised and ambulated with min +2 for safety.  Plan is for d/c home today with wife, and daughter helps intermittently.  If pt remains in the hospital, we will follow him, working on toileting and mobility related to adls with min guard level goals.    Follow Up Recommendations  Supervision/Assistance - 24 hour    Equipment Recommendations  None recommended by OT    Recommendations for Other Services       Precautions / Restrictions Precautions Precautions: Fall      Mobility Bed Mobility Overal bed mobility: Needs Assistance Bed Mobility: Supine to Sit     Supine to sit: Mod assist     General bed mobility comments: encouragement to mobilize, patient initiated maving legs but did require assistance to fully sit up and slide to bed edge.  Transfers Overall transfer level: Needs assistance Equipment used: Rolling walker (2 wheeled) Transfers: Sit to/from Stand Sit to Stand: Min assist;+2 safety/equipment;Mod assist         General transfer comment: cues to rise ans steady assist to rise from bed, to reach back to recliner.    Balance                                           ADL either performed or assessed with clinical judgement   ADL                           Toilet Transfer: Minimal/moderate assistance;Ambulation;RW;+2 for safety/equipment(chair)             General ADL Comments: wife has been assisting with adls      Vision         Perception     Praxis      Pertinent Vitals/Pain Pain Assessment: No/denies pain Pain Location: edema in R>L hands and feet. "feels tight" Pain Intervention(s): Monitored during session     Hand Dominance     Extremity/Trunk Assessment Upper Extremity Assessment Upper Extremity Assessment: Generalized weakness(bil edema in hands, R >)   Lower Extremity Assessment Lower Extremity Assessment: Generalized weakness   Cervical / Trunk Assessment Cervical / Trunk Assessment: Kyphotic   Communication Communication Communication: No difficulties(little verbalization Simultaneous filing. User may not have seen previous data.)   Cognition Arousal/Alertness: Awake/alert Behavior During Therapy: Flat affect Overall Cognitive Status: Impaired/Different from baseline Area of Impairment: Memory;Safety/judgement;Problem solving                             Problem Solving: Slow processing;Difficulty sequencing General Comments: pt with poor initiation, much encouragenment to participate.   General Comments       Exercises     Shoulder Instructions      Home Living Family/patient expects to be discharged to:: Private residence Living Arrangements: Spouse/significant other  Available Help at Discharge: Family;Available 24 hours/day Type of Home: House Home Access: Stairs to enter [employer] of Steps: 3 Entrance Stairs-Rails: None Home Layout: One level     Bathroom Shower/Tub: Tub/shower unit         Home Equipment: Bedside commode;Shower seat - built in;Walker - 2 wheels(Simultaneous filing. User may not have seen previous data.)          Prior Functioning/Environment Level of Independence: Needs assistance  Gait / Transfers Assistance Needed: use of walker at home.  ADL's / Homemaking Assistance Needed: assist for adls by wife(Simultaneous filing. User may not have seen previous data.)            OT Problem List:  Decreased activity tolerance;Decreased strength;Impaired balance (sitting and/or standing);Decreased cognition;Decreased safety awareness      OT Treatment/Interventions: Self-care/ADL training;DME and/or AE instruction;Therapeutic activities;Balance training;Therapeutic exercise;Energy conservation;Patient/family education    OT Goals(Current goals can be found in the care plan section) Acute Rehab OT Goals Patient Stated Goal: to wait til tomorrow when daughter is here OT Goal Formulation: With patient Time For Goal Achievement: 07/04/18 Potential to Achieve Goals: Good ADL Goals Pt Will Transfer to Toilet: with min guard assist;bedside commode;ambulating;stand pivot transfer Additional ADL Goal #1: pt will go from sit to stand with min guard and maintain x 2 minutes at this level for adls Additional ADL Goal #2: pt will perform bed mobility from flat bed with min A in preparation for adls  OT Frequency: Min 2X/week   Barriers to D/C:            Co-evaluation PT/OT/SLP Co-Evaluation/Treatment: Yes Reason for Co-Treatment: For patient/therapist safety PT goals addressed during session: Mobility/safety with mobility OT goals addressed during session: Strengthening/ROM      AM-PAC OT "6 Clicks" Daily Activity     Outcome Measure Help from another person eating meals?: A Little Help from another person taking care of personal grooming?: A Little Help from another person toileting, which includes using toliet, bedpan, or urinal?: A Lot Help from another person bathing (including washing, rinsing, drying)?: A Lot Help from another person to put on and taking off regular upper body clothing?: A Lot Help from another person to put on and taking off regular lower body clothing?: Total 6 Click Score: 13   End of Session    Activity Tolerance: Patient limited by fatigue Patient left: with call bell/phone within reach;with family/visitor present;in chair  OT Visit Diagnosis: Muscle  weakness (generalized) (M62.81);Unsteadiness on feet (R26.81)                Time: 1021-1173 OT Time Calculation (min): 24 min Charges:  OT General Charges $OT Visit: 1 Visit OT Evaluation $OT Eval Low Complexity: [place], OTR/L Acute Rehabilitation Services [phone] [facility] pager [phone] office 06/20/2018  [place] 06/20/2018, 1:39 PM

## 2018-06-20 NOTE — Progress Notes (Signed)
Events noted overnight.  He underwent an endoscopy which did not show any clear-cut bleeding.  Patient is clinically stable without any signs or symptoms of bleeding.  Laboratory data reviewed which showed a hemoglobin of 7.8 and platelet count of 41 as of 3:07 AM on 06/20/2018.  I have updated the patient and his daughters present today and answered all her questions.  I agree with no further invasive procedures per the request.  I recommend continued supportive transfusion as needed.  I would transfuse platelets only if active bleeding is noted.

## 2018-06-21 LAB — BPAM PLATELET PHERESIS
Blood Product Expiration Date: 202002292359
ISSUE DATE / TIME: 202002282317
Unit Type and Rh: 5100

## 2018-06-21 LAB — PREPARE PLATELET PHERESIS: Unit division: 0

## 2018-06-22 LAB — TYPE AND SCREEN
ABO/RH(D): O POS
Antibody Screen: NEGATIVE
UNIT DIVISION: 0
Unit division: 0
Unit division: 0
Unit division: 0
Unit division: 0
Unit division: 0

## 2018-06-22 LAB — BPAM RBC
BLOOD PRODUCT EXPIRATION DATE: 202003252359
Blood Product Expiration Date: 202003252359
Blood Product Expiration Date: 202003252359
Blood Product Expiration Date: 202003252359
Blood Product Expiration Date: 202003252359
Blood Product Expiration Date: 202003252359
ISSUE DATE / TIME: 202002261840
ISSUE DATE / TIME: 202002262318
ISSUE DATE / TIME: 202002270234
ISSUE DATE / TIME: 202002281542
ISSUE DATE / TIME: 202002280736
UNIT TYPE AND RH: 5100
Unit Type and Rh: 5100
Unit Type and Rh: 5100
Unit Type and Rh: 5100
Unit Type and Rh: 5100
Unit Type and Rh: 5100

## 2018-06-23 ENCOUNTER — Inpatient Hospital Stay: Payer: Medicare HMO

## 2018-06-23 ENCOUNTER — Inpatient Hospital Stay: Payer: Medicare HMO | Admitting: Nutrition

## 2018-06-23 ENCOUNTER — Inpatient Hospital Stay: Payer: Medicare HMO | Attending: Oncology

## 2018-06-23 ENCOUNTER — Other Ambulatory Visit: Payer: Self-pay | Admitting: Oncology

## 2018-06-23 ENCOUNTER — Other Ambulatory Visit: Payer: Self-pay

## 2018-06-23 DIAGNOSIS — D649 Anemia, unspecified: Secondary | ICD-10-CM

## 2018-06-23 DIAGNOSIS — D6489 Other specified anemias: Secondary | ICD-10-CM

## 2018-06-23 DIAGNOSIS — Z79899 Other long term (current) drug therapy: Secondary | ICD-10-CM | POA: Diagnosis not present

## 2018-06-23 DIAGNOSIS — C8311 Mantle cell lymphoma, lymph nodes of head, face, and neck: Secondary | ICD-10-CM

## 2018-06-23 DIAGNOSIS — D696 Thrombocytopenia, unspecified: Secondary | ICD-10-CM | POA: Diagnosis not present

## 2018-06-23 DIAGNOSIS — C61 Malignant neoplasm of prostate: Secondary | ICD-10-CM | POA: Diagnosis not present

## 2018-06-23 DIAGNOSIS — C8310 Mantle cell lymphoma, unspecified site: Secondary | ICD-10-CM | POA: Diagnosis not present

## 2018-06-23 LAB — CBC WITH DIFFERENTIAL (CANCER CENTER ONLY)
Abs Immature Granulocytes: 0 10*3/uL (ref 0.00–0.07)
BASOS PCT: 0 %
Band Neutrophils: 10 %
Basophils Absolute: 0 10*3/uL (ref 0.0–0.1)
Eosinophils Absolute: 0 10*3/uL (ref 0.0–0.5)
Eosinophils Relative: 1 %
HCT: 31.7 % — ABNORMAL LOW (ref 39.0–52.0)
Hemoglobin: 10.7 g/dL — ABNORMAL LOW (ref 13.0–17.0)
Lymphocytes Relative: 51 %
Lymphs Abs: 0.7 10*3/uL (ref 0.7–4.0)
MCH: 28.7 pg (ref 26.0–34.0)
MCHC: 33.8 g/dL (ref 30.0–36.0)
MCV: 85 fL (ref 80.0–100.0)
Metamyelocytes Relative: 1 %
Monocytes Absolute: 0.1 10*3/uL (ref 0.1–1.0)
Monocytes Relative: 8 %
Myelocytes: 2 %
Neutro Abs: 0.5 10*3/uL — ABNORMAL LOW (ref 1.7–17.7)
Neutrophils Relative %: 27 %
Platelet Count: 11 10*3/uL — ABNORMAL LOW (ref 150–400)
RBC: 3.73 MIL/uL — ABNORMAL LOW (ref 4.22–5.81)
RDW: 14.8 % (ref 11.5–15.5)
WBC Count: 1.4 10*3/uL — ABNORMAL LOW (ref 4.0–10.5)
nRBC: 4.3 % — ABNORMAL HIGH (ref 0.0–0.2)

## 2018-06-23 LAB — SAMPLE TO BLOOD BANK

## 2018-06-23 MED ORDER — ACETAMINOPHEN 325 MG PO TABS
ORAL_TABLET | ORAL | Status: AC
  Filled 2018-06-23: qty 2

## 2018-06-23 MED ORDER — SODIUM CHLORIDE 0.9% IV SOLUTION
250.0000 mL | Freq: Once | INTRAVENOUS | Status: AC
  Administered 2018-06-23: 250 mL via INTRAVENOUS
  Filled 2018-06-23: qty 250

## 2018-06-23 MED ORDER — DIPHENHYDRAMINE HCL 25 MG PO CAPS
25.0000 mg | ORAL_CAPSULE | Freq: Once | ORAL | Status: AC
  Administered 2018-06-23: 25 mg via ORAL

## 2018-06-23 MED ORDER — ACETAMINOPHEN 325 MG PO TABS
650.0000 mg | ORAL_TABLET | Freq: Once | ORAL | Status: AC
  Administered 2018-06-23: 650 mg via ORAL

## 2018-06-23 MED ORDER — DIPHENHYDRAMINE HCL 25 MG PO CAPS
ORAL_CAPSULE | ORAL | Status: AC
  Filled 2018-06-23: qty 1

## 2018-06-23 NOTE — Progress Notes (Signed)
No Lupron injection today and patient will receive 1 unit platelets per Dr. Alen Blew. Orders entered.

## 2018-06-23 NOTE — Patient Instructions (Signed)

## 2018-06-23 NOTE — Progress Notes (Signed)
78 year old male diagnosed with advanced prostate cancer, mantle cell lymphoma receiving blood today. Patient is receiving supportive care only.He was diagnosed with severe protein calorie malnutrition in the hospital the end of February. Patient reports decreased appetite but then states he eats ok. He has tried boost and states he doesn't like it much. There is no family present at this time. I offered patient oral nutrition supplement samples and he agreed. I also provided coupons. Patient is refusing anything to eat or drink at this time. Encouraged patient to try to eat small amounts throughout the day. Recommended he drink ONS if he is willing. I will continue to support patient as needed.

## 2018-06-24 ENCOUNTER — Telehealth: Payer: Self-pay

## 2018-06-24 LAB — PREPARE PLATELET PHERESIS: Unit division: 0

## 2018-06-24 LAB — BPAM PLATELET PHERESIS
Blood Product Expiration Date: 202003042359
ISSUE DATE / TIME: 202003021217
Unit Type and Rh: 5100

## 2018-06-24 NOTE — Telephone Encounter (Signed)
Received call from [employer] with [facility] for PT 2x/week for 2 weeks and 1x/week for 3 weeks and for 1 skilled RN visit requesting verbal order which was provided.

## 2018-06-26 ENCOUNTER — Telehealth: Payer: Self-pay

## 2018-06-26 NOTE — Telephone Encounter (Signed)
Received message from [place] with [facility] for OT visits 1x/week x 1week and 2x/week for 3 weeks. Left return message with verbal order for requested visits.

## 2018-06-26 NOTE — Telephone Encounter (Signed)
Received call from [place] in IR stating that she contacted the patient to schedule PICC line and patient refused. Contacted patient and spoke with the spouse and she stated that she has has friends and family try to convince the patient to get the PICC line but he is refusing. She discussed how difficult it has been for the staff to establish an IV for the patient treatments even in the hospital and has discussed with the patient that he will not be able to get treated if an IV cannot be established. She stated that he is refusing the PICC but not the treatments. She stated that is he changes his mind she will let us know.

## 2018-06-27 ENCOUNTER — Other Ambulatory Visit: Payer: Self-pay

## 2018-06-27 ENCOUNTER — Inpatient Hospital Stay (HOSPITAL_COMMUNITY)
Admission: EM | Admit: 2018-06-27 | Discharge: 2018-07-23 | DRG: 808 | Disposition: E | Payer: Medicare HMO | Attending: Internal Medicine | Admitting: Internal Medicine

## 2018-06-27 ENCOUNTER — Telehealth: Payer: Self-pay

## 2018-06-27 ENCOUNTER — Encounter (HOSPITAL_COMMUNITY): Payer: Self-pay | Admitting: Emergency Medicine

## 2018-06-27 DIAGNOSIS — Z923 Personal history of irradiation: Secondary | ICD-10-CM

## 2018-06-27 DIAGNOSIS — G9341 Metabolic encephalopathy: Secondary | ICD-10-CM | POA: Diagnosis present

## 2018-06-27 DIAGNOSIS — Z832 Family history of diseases of the blood and blood-forming organs and certain disorders involving the immune mechanism: Secondary | ICD-10-CM | POA: Diagnosis not present

## 2018-06-27 DIAGNOSIS — K219 Gastro-esophageal reflux disease without esophagitis: Secondary | ICD-10-CM | POA: Diagnosis present

## 2018-06-27 DIAGNOSIS — E43 Unspecified severe protein-calorie malnutrition: Secondary | ICD-10-CM | POA: Diagnosis not present

## 2018-06-27 DIAGNOSIS — R531 Weakness: Secondary | ICD-10-CM | POA: Diagnosis not present

## 2018-06-27 DIAGNOSIS — Z87891 Personal history of nicotine dependence: Secondary | ICD-10-CM

## 2018-06-27 DIAGNOSIS — C8311 Mantle cell lymphoma, lymph nodes of head, face, and neck: Secondary | ICD-10-CM | POA: Diagnosis not present

## 2018-06-27 DIAGNOSIS — Z801 Family history of malignant neoplasm of trachea, bronchus and lung: Secondary | ICD-10-CM | POA: Diagnosis not present

## 2018-06-27 DIAGNOSIS — Z681 Body mass index (BMI) 19 or less, adult: Secondary | ICD-10-CM

## 2018-06-27 DIAGNOSIS — Z8546 Personal history of malignant neoplasm of prostate: Secondary | ICD-10-CM | POA: Diagnosis not present

## 2018-06-27 DIAGNOSIS — D61818 Other pancytopenia: Secondary | ICD-10-CM | POA: Diagnosis not present

## 2018-06-27 DIAGNOSIS — R197 Diarrhea, unspecified: Secondary | ICD-10-CM | POA: Diagnosis not present

## 2018-06-27 DIAGNOSIS — D61810 Antineoplastic chemotherapy induced pancytopenia: Principal | ICD-10-CM | POA: Diagnosis present

## 2018-06-27 DIAGNOSIS — R7989 Other specified abnormal findings of blood chemistry: Secondary | ICD-10-CM | POA: Diagnosis present

## 2018-06-27 DIAGNOSIS — Z515 Encounter for palliative care: Secondary | ICD-10-CM | POA: Diagnosis present

## 2018-06-27 DIAGNOSIS — Z66 Do not resuscitate: Secondary | ICD-10-CM | POA: Diagnosis present

## 2018-06-27 DIAGNOSIS — N183 Chronic kidney disease, stage 3 unspecified: Secondary | ICD-10-CM | POA: Diagnosis present

## 2018-06-27 DIAGNOSIS — D649 Anemia, unspecified: Secondary | ICD-10-CM | POA: Diagnosis not present

## 2018-06-27 DIAGNOSIS — I671 Cerebral aneurysm, nonruptured: Secondary | ICD-10-CM | POA: Diagnosis present

## 2018-06-27 DIAGNOSIS — R945 Abnormal results of liver function studies: Secondary | ICD-10-CM | POA: Diagnosis not present

## 2018-06-27 DIAGNOSIS — K921 Melena: Secondary | ICD-10-CM | POA: Diagnosis present

## 2018-06-27 DIAGNOSIS — Z8249 Family history of ischemic heart disease and other diseases of the circulatory system: Secondary | ICD-10-CM

## 2018-06-27 DIAGNOSIS — T451X5A Adverse effect of antineoplastic and immunosuppressive drugs, initial encounter: Secondary | ICD-10-CM | POA: Diagnosis present

## 2018-06-27 LAB — COMPREHENSIVE METABOLIC PANEL
ALBUMIN: 1.8 g/dL — AB (ref 3.5–5.0)
ALT: 52 U/L — ABNORMAL HIGH (ref 0–44)
AST: 62 U/L — ABNORMAL HIGH (ref 15–41)
Alkaline Phosphatase: 317 U/L — ABNORMAL HIGH (ref 38–126)
Anion gap: 11 (ref 5–15)
BUN: 52 mg/dL — ABNORMAL HIGH (ref 8–23)
CO2: 20 mmol/L — AB (ref 22–32)
Calcium: 8.5 mg/dL — ABNORMAL LOW (ref 8.9–10.3)
Chloride: 116 mmol/L — ABNORMAL HIGH (ref 98–111)
Creatinine, Ser: 1.3 mg/dL — ABNORMAL HIGH (ref 0.61–1.24)
GFR calc Af Amer: >60 mL/min (ref >60)
GFR calc non Af Amer: 53 mL/min — ABNORMAL LOW (ref >60)
Glucose, Bld: 141 mg/dL — ABNORMAL HIGH (ref 70–99)
Potassium: 3.7 mmol/L (ref 3.5–5.1)
Sodium: 147 mmol/L — ABNORMAL HIGH (ref 135–145)
Total Bilirubin: 2.6 mg/dL — ABNORMAL HIGH (ref 0.3–1.2)
Total Protein: 4.1 g/dL — ABNORMAL LOW (ref 6.5–8.1)

## 2018-06-27 LAB — RETICULOCYTES
Immature Retic Fract: 18.4 % — ABNORMAL HIGH (ref 2.3–15.9)
RBC.: 1.72 MIL/uL — ABNORMAL LOW (ref 4.22–5.81)
Retic Count, Absolute: 7.6 10*3/uL — ABNORMAL LOW (ref 19.0–186.0)
Retic Ct Pct: 0.4 % (ref 0.4–3.1)

## 2018-06-27 LAB — CBC WITH DIFFERENTIAL/PLATELET
Abs Immature Granulocytes: 0.3 10*3/uL — ABNORMAL HIGH (ref 0.00–0.07)
Basophils Absolute: 0 10*3/uL (ref 0.0–0.1)
Basophils Relative: 0 %
Eosinophils Absolute: 0 10*3/uL (ref 0.0–0.5)
Eosinophils Relative: 0 %
HCT: 15.4 % — ABNORMAL LOW (ref 39.0–52.0)
Hemoglobin: 4.8 g/dL — CL (ref 13.0–17.0)
LYMPHS ABS: 2 10*3/uL (ref 0.7–4.0)
Lymphocytes Relative: 62 %
MCH: 27.9 pg (ref 26.0–34.0)
MCHC: 31.2 g/dL (ref 30.0–36.0)
MCV: 89.5 fL (ref 80.0–100.0)
MONOS PCT: 10 %
MYELOCYTES: 4 %
Monocytes Absolute: 0.3 10*3/uL (ref 0.1–1.0)
Neutro Abs: 0.6 10*3/uL — ABNORMAL LOW (ref 1.7–7.7)
Neutrophils Relative %: 20 %
Platelets: 8 10*3/uL — CL (ref 150–400)
Promyelocytes Relative: 4 %
RBC: 1.72 MIL/uL — ABNORMAL LOW (ref 4.22–5.81)
RDW: 15.1 % (ref 11.5–15.5)
WBC: 3.2 10*3/uL — ABNORMAL LOW (ref 4.0–10.5)
nRBC: 3.1 % — ABNORMAL HIGH (ref 0.0–0.2)

## 2018-06-27 LAB — LACTIC ACID, PLASMA: LACTIC ACID, VENOUS: 4.6 mmol/L — AB (ref 0.5–1.9)

## 2018-06-27 LAB — APTT: aPTT: 35 seconds (ref 24–36)

## 2018-06-27 LAB — PROTIME-INR
INR: 1.2 (ref 0.8–1.2)
Prothrombin Time: 15.3 seconds — ABNORMAL HIGH (ref 11.4–15.2)

## 2018-06-27 LAB — PREPARE RBC (CROSSMATCH)

## 2018-06-27 MED ORDER — FUROSEMIDE 10 MG/ML IJ SOLN: 40.0000 mg | Freq: Once | INTRAMUSCULAR | Status: DC

## 2018-06-27 MED ORDER — ONDANSETRON HCL 4 MG PO TABS: 4.0000 mg | ORAL_TABLET | Freq: Four times a day (QID) | ORAL | Status: DC | PRN

## 2018-06-27 MED ORDER — ACETAMINOPHEN 650 MG RE SUPP: 650.0000 mg | Freq: Four times a day (QID) | RECTAL | Status: DC | PRN

## 2018-06-27 MED ORDER — SODIUM CHLORIDE 0.9 % IV SOLN: 10.0000 mL/h | Freq: Once | INTRAVENOUS | Status: DC

## 2018-06-27 MED ORDER — PANTOPRAZOLE SODIUM 40 MG IV SOLR: 40.0000 mg | Freq: Two times a day (BID) | INTRAVENOUS | Status: DC

## 2018-06-27 MED ORDER — ACETAMINOPHEN 325 MG PO TABS: 650.0000 mg | ORAL_TABLET | Freq: Four times a day (QID) | ORAL | Status: DC | PRN

## 2018-06-27 MED ORDER — LACTATED RINGERS IV SOLN
INTRAVENOUS | Status: DC
  Administered 2018-06-27: 20:00:00 via INTRAVENOUS

## 2018-06-27 MED ORDER — ONDANSETRON HCL 4 MG/2ML IJ SOLN: 4.0000 mg | Freq: Four times a day (QID) | INTRAMUSCULAR | Status: DC | PRN

## 2018-06-27 NOTE — ED Notes (Signed)
Date and time results received: 07/13/2018 2050   Test: Lactic Acid Critical Value: 4.6  Name of Provider Notified: Dr. Kathrynn Humble  Orders Received? Or Actions Taken?:

## 2018-06-27 NOTE — H&P (Addendum)
History and Physical    [name] KGM:[id number] DOB: April 15, 1941 DOA: 07/03/2018  Referring MD/NP/PA:   PCP: [name], MD   Patient coming from:  The patient is coming from home.  At baseline, pt is independent for most of ADL.        Chief Complaint: AMS and weakness, dirreha  HPI: Joel [name] is a 78 y.o. male with medical history significant of mantle cell lymphoma, prostate cancer (s/p of TURP), GIB, AVM, CKD 3, brain aneurysm, GERD, bilateral hydronephrosis, who presents with altered mental status, weakness, and diarrhea  Per patient's wife and the daughter, patient has been having generalized weakness for about 1 week. He has mild shortness of breath sometimes.  Patient becomes confused since yesterday.  He moves all extremities.  No facial droop or slurred speech. Per his wife, not sure if pt has pain any where. Pt has 3 loose and dark stool diarrhea since yesterday. No active nausea vomiting, respiratory distress, cough noted.  No fever or chills per his wife.  Of note, pt was recently hospitalized from 2/26-2/28 due to severe anemia 2/2 to GIB. His Hgb dropped to 2.6 and received 3 units of packed RBCs and 1 unit of platelets. Pt had EGD on 06/19/2018 which showed no evidence of active bleeding ulcers or bleeding AVMs, but inflammation and petechiae were seen in the duodenal bulb. Pt had NM GI loss study on 06/02/18 which was negative.  ED Course: pt was found to have pancytopenia (WBC 3.2, hemoglobin 4.8, platelet 8), lactic acid 4.6, INR 1.2, PTT 35, abnormal liver function (ALP 317, AST 62, ALT 52, total bilirubin 2.6), stable renal function, tachycardia, tachypnea, oxygen saturation 98% on room air.  Patient is admitted to telemetry bed as inpatient.  Review of Systems: Could not reviewed due to altered mental status.  Allergy: No Known Allergies  Past Medical History:  Diagnosis Date  . Anemia   . Aneurysm ([place])    in 1987, brain aneurysm  . Bilateral  hydronephrosis   . History of radiation therapy 04/17/17- 05/07/17   Right neck treated to 32.5 Gy with 13 fx of 2.5 Gy  . mantle cell lymphoma dx'd 03/2017  . Mass of right side of neck   . Pneumonia    hx of   . Prostate CA ([place]) dx'd 2017  . Urinary retention     Past Surgical History:  Procedure Laterality Date  . BIOPSY  05/21/2018   Procedure: BIOPSY;  Surgeon: Milus Banister, MD;  Location: [facility];  Service: Gastroenterology;;  . COLONOSCOPY WITH PROPOFOL N/A 06/03/2018   Procedure: COLONOSCOPY WITH PROPOFOL;  Surgeon: Ronnette Juniper, MD;  Location: [facility];  Service: Gastroenterology;  Laterality: N/A;  . ESOPHAGOGASTRODUODENOSCOPY (EGD) WITH PROPOFOL N/A 05/21/2018   Procedure: ESOPHAGOGASTRODUODENOSCOPY (EGD) WITH PROPOFOL;  Surgeon: Milus Banister, MD;  Location: [facility];  Service: Gastroenterology;  Laterality: N/A;  . ESOPHAGOGASTRODUODENOSCOPY (EGD) WITH PROPOFOL N/A 06/03/2018   Procedure: ESOPHAGOGASTRODUODENOSCOPY (EGD) WITH PROPOFOL;  Surgeon: Ronnette Juniper, MD;  [facility];  Service: Gastroenterology;  Laterality: N/A;  . ESOPHAGOGASTRODUODENOSCOPY (EGD) WITH PROPOFOL N/A 06/19/2018   Procedure: ESOPHAGOGASTRODUODENOSCOPY (EGD) WITH PROPOFOL;  Surgeon: Otis Brace, MD;  [facility]: [facility];  Service: Gastroenterology;  Laterality: N/A;  . HOT HEMOSTASIS N/A 05/21/2018   Procedure: HOT HEMOSTASIS (ARGON PLASMA COAGULATION/BICAP);  Surgeon: Milus Banister, MD;  Location: [facility];  Service: Gastroenterology;  Laterality: N/A;  . HOT HEMOSTASIS N/A 06/03/2018   Procedure: HOT HEMOSTASIS (ARGON PLASMA COAGULATION/BICAP);  Surgeon: Ronnette Juniper, MD;  Location: [facility];  Service: Gastroenterology;  Laterality: N/A;  . MASS BIOPSY Right 03/27/2017   Procedure: OPEN NECK MASS BIOPSY OF THE RIGHT NECK;  Surgeon: Leta Baptist, MD;  Location: [facility];  Service: ENT;  Laterality: Right;  . POLYPECTOMY  06/03/2018   Procedure: POLYPECTOMY;  Surgeon:  Ronnette Juniper, MD;  Location: [facility];  Service: Gastroenterology;;  . PROSTATE BIOPSY N/A 07/30/2015   Procedure: PROSTATE BIOPSY;  Surgeon: Irine Seal, MD;  Location: [facility];  Service: Urology;  Laterality: N/A;  . TRANSURETHRAL RESECTION OF PROSTATE N/A 07/30/2015   Procedure: TRANSURETHRAL RESECTION OF THE PROSTATE (TURP);  Surgeon: Irine Seal, MD;  Location: [facility];  Service: Urology;  Laterality: N/A;    Social History:  reports that he quit smoking about 33 years ago. He has quit using smokeless tobacco.  His smokeless tobacco use included chew. He reports that he does not drink alcohol or use drugs.  Family History:  Family History  Problem Relation Age of Onset  . Dementia Mother   . Heart disease Father   . Lung cancer Brother   . Lupus Sister      Prior to Admission medications   Medication Sig Start Date End Date Taking? Authorizing Provider  acetaminophen (TYLENOL) 325 MG tablet Take 325 mg by mouth every 6 (six) hours as needed for mild pain or headache.    [provider]  ferrous sulfate (SLOW FE) 160 (50 Fe) MG TBCR SR tablet Take 1 tablet (160 mg total) by mouth daily. 06/20/18   Georgette Shell, MD  OVER THE COUNTER MEDICATION Take 1-2 drops by mouth 2 (two) times daily. Papaya drops (to help blood platelets)    [provider]  pantoprazole (PROTONIX) 40 MG tablet Take 1 tablet (40 mg total) by mouth daily. 06/20/18 06/20/19  Georgette Shell, MD    Physical Exam: Vitals:   06/26/2018 2030 07/12/2018 2045 07/17/2018 2100 06/26/2018 2115  BP: 124/67 131/74 106/68 120/67  Pulse: (!) 105 (!) 110 94 95  Resp: (!) 26 (!) 28 (!) 23 (!) 25  SpO2: 98% 98% 96% 99%   General: Not in acute distress. Pale looking. HEENT:       Eyes: PERRL, EOMI, no scleral icterus.       ENT: No discharge from the ears and nose, no pharynx injection, no tonsillar enlargement.        Neck: No JVD, no bruit, no mass felt. Heme: No neck lymph node enlargement. Cardiac:  S1/S2, RRR, No murmurs, No gallops or rubs. Respiratory: has rhonchi and crackles bilaterally GI: Soft, nondistended, nontender, no organomegaly, BS present. GU: No hematuria Ext: 1+ pitting leg edema bilaterally. 2+DP/PT pulse bilaterally. Musculoskeletal: No joint deformities, No joint redness or warmth, no limitation of ROM in spin. Skin: No rashes.  Neuro: Alert, oriented X3, cranial nerves II-XII grossly intact, moves all extremities. Psych: Patient is not psychotic, no suicidal or hemocidal ideation.  Labs on Admission: I have personally reviewed following labs and imaging studies  CBC: Recent Labs  Lab 06/23/18 0812 06/24/2018 1957  WBC 1.4* 3.2*  NEUTROABS 0.5* 0.6*  HGB 10.7* 4.8*  HCT 31.7* 15.4*  MCV 85.0 89.5  PLT 11* 8*   Basic Metabolic Panel: Recent Labs  Lab 07/07/2018 1957  NA 147*  K 3.7  CL 116*  CO2 20*  GLUCOSE 141*  BUN 52*  CREATININE 1.30*  CALCIUM 8.5*   GFR: Estimated Creatinine Clearance: 38.3 mL/min (A) (by  C-G formula based on SCr of 1.3 mg/dL (H)). Liver Function Tests: Recent Labs  Lab 06/26/2018 1957  AST 62*  ALT 52*  ALKPHOS 317*  BILITOT 2.6*  PROT 4.1*  ALBUMIN 1.8*   No results for input(s): LIPASE, AMYLASE in the last 168 hours. No results for input(s): AMMONIA in the last 168 hours. Coagulation Profile: Recent Labs  Lab 06/24/2018 1957  INR 1.2   Cardiac Enzymes: No results for input(s): CKTOTAL, CKMB, CKMBINDEX, TROPONINI in the last 168 hours. BNP (last 3 results) No results for input(s): PROBNP in the last 8760 hours. HbA1C: No results for input(s): HGBA1C in the last 72 hours. CBG: No results for input(s): GLUCAP in the last 168 hours. Lipid Profile: No results for input(s): CHOL, HDL, LDLCALC, TRIG, CHOLHDL, LDLDIRECT in the last 72 hours. Thyroid Function Tests: No results for input(s): TSH, T4TOTAL, FREET4, T3FREE, THYROIDAB in the last 72 hours. Anemia Panel: Recent Labs    06/26/2018 1957  RETICCTPCT 0.4     Urine analysis:    Component Value Date/Time   COLORURINE YELLOW 05/17/2018 0757   APPEARANCEUR HAZY (A) 05/17/2018 0757   LABSPEC 1.013 05/17/2018 0757   PHURINE 6.0 05/17/2018 0757   GLUCOSEU NEGATIVE 05/17/2018 0757   HGBUR NEGATIVE 05/17/2018 0757   BILIRUBINUR NEGATIVE 05/17/2018 0757   BILIRUBINUR negative 07/05/2015 1801   KETONESUR NEGATIVE 05/17/2018 0757   PROTEINUR NEGATIVE 05/17/2018 0757   UROBILINOGEN 0.2 07/05/2015 1801   NITRITE NEGATIVE 05/17/2018 0757   LEUKOCYTESUR LARGE (A) 05/17/2018 0757   Sepsis Labs: @LABRCNTIP (procalcitonin:4,lacticidven:4) ) Recent Results (from the past 240 hour(s))  MRSA PCR Screening     Status: None   Collection Time: 06/19/18  2:46 AM  Result Value Ref Range Status   MRSA by PCR NEGATIVE NEGATIVE Final    Comment:        The GeneXpert MRSA Assay (FDA approved for NASAL specimens only), is one component of a comprehensive MRSA colonization surveillance program. It is not intended to diagnose MRSA infection nor to guide or monitor treatment for MRSA infections. Performed at [facility], [place] [address], [place], [place] [address]      Radiological Exams on Admission: No results found.   EKG:  Not done in ED, will get one.   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  Blood pressure dropped, and became more and more confused.  I discussed with his wife and daughter about the possibility of starting comfort care. Before they make the decision, pt has unfortunately passed away.  Family are very supportive. Emotional  support offered to family at bedside  Symptomatic anemia and pancytopenia: WBC 3.2, hemoglobin 4.8, platelet 8. Pt has hx of Mantle cell lymphoma and bone marrow failure of unknown etiology. His worsening anemia is likely due to combination of bone marrow failure and GI bleeding secondary to severe thrombocytopenia.  Patient had similar admission recently . He had EGD on 06/19/2018 which showed no evidence of active bleeding ulcers or bleeding AVMs and inflammation and petechiae were seen in the duodenal bulb.  I have had extensive discussion with his wife and daughter about  goals of care.  At this moment, they want patient to be transfused and hold off any further image work-up and GI work-up.   -will admit to tele bed as inpt - 2 large IV - protonix 40 mg bid by IV -will transfuse patient with 4 unit of blood and 1 unit of pheresed platelets -check CBC in AM. -will give 40 mg of lasix after finishing 1st units of blood transfusion. -f/u palliative consult  Diarrhea: pt has bloody diarrhea, likely due to GI bleeding secondary to thrombocytopenia. -will hold off further work up and do blood transfusion now -f/u palliative care for goal of care.  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 Pt has very poor prognosis per oncologist -Will need ot arrange for hospice follow-up on discharge.  Protein-calorie malnutrition, severe ([place]): -start nutrition supplement when able to eat  CKD (chronic kidney disease), stage III ([place]): stable -f/u by CBC  GERD (gastroesophageal reflux disease): -on iV protonix  Abnormal LFTs: Etiology is not clear.   -Will hold off a further work-up at this moment.  And follow-up palliative consult  for goals of care  Acute metabolic encephalopathy: Etiology is not clear.  Likely due to multifactorial etiology.  Per family, will hold off brain image at this moment. -Frequent neuro check -Treat underlying issues as above.  Goal of  care: I discussed with his wife and the daughter about goals of care extensively.  Per them, will hold off any image test, and just get blood transfusion now. Will get palliative care. Pt will need hospice care.    Inpatient status:  # Patient requires inpatient status due to high intensity of service, high risk for further deterioration and high frequency of surveillance required.  I certify that at the point of admission it is my clinical judgment that the patient will require inpatient hospital care spanning beyond 2 midnights from the point of admission.  . This patient has multiple chronic comorbidities including mantle cell lymphoma, prostate cancer (s/p of TURP), GIB, AVM, CKD 3, brain aneurysm, GERD, bilateral hydronephrosis. . Now patient has presenting with AMS, symptomatic anemia, diarrhea . The worrisome physical exam findings include altered mental status, leg edema, rhonchi and crackles on lung auscultation . The initial radiographic and laboratory data are worrisome because of severe pancytopenia, abnormal liver function . Current medical needs: please see my assessment and plan . Predictability of an adverse outcome (risk): Patient has highly complicated multiple severe comorbidities, now presents with AMS, severe symptomatic anemia and diarrhea.  Patient's presentation is highly complicated.patient high risk of deteriorating including death.  Will need to be treated in hospital for at least 2 days. .     DVT ppx: SCD Code Status: DNR per his wife and daughter (I discussed with patient's wife and daughter, and explained the meaning of CODE STATUS. Per  his wife and daughter, patient would want to be DNR) Family Communication:  Yes, patient's wife and daugher  at bed side Disposition Plan:  Anticipate discharge back to previous home environment Consults called:  none Admission status:  tele/inpation       Date of Service 06/26/2018    [facility]   If  7PM-7AM, please contact night-coverage [email or web address] Password [email or web address] 07/05/2018, 10:00 PM

## 2018-06-27 NOTE — ED Notes (Signed)
Patient sleeping comfortably. NAD.

## 2018-06-27 NOTE — Telephone Encounter (Signed)
Received call from patient spouse stating that the patient has been having episodes diarrhea with blood in it., not eating or drinking and difficult to arouse. Advised her to go to the ED. She verbalized understanding and agreeable with the plan.

## 2018-06-27 NOTE — ED Notes (Signed)
Bed: NW06 Expected date:  Expected time:  Means of arrival:  Comments: EMS-weakness/diarrhea

## 2018-06-27 NOTE — ED Notes (Signed)
Date and time results received: 07/03/2018  (use smartphrase ".now" to insert current time)  Test: Hgb and Plts Critical Value: Hgb 4.8, plts 8  Name of Provider Notified: Kathrynn Humble  Orders Received? Or Actions Taken?: Orders Received - See Orders for details

## 2018-06-27 NOTE — ED Triage Notes (Signed)
Pt BIBA from home.  Family reports pt has had diarrhea, general weakness x1 day.  Pt able to respond to name, family reports lethargy.  Hx of bone marrow d/o.   EMS reports O2 92% on RA, 99% on 4L by [place].

## 2018-06-28 ENCOUNTER — Encounter: Payer: Self-pay | Admitting: Oncology

## 2018-06-28 DIAGNOSIS — C8311 Mantle cell lymphoma, lymph nodes of head, face, and neck: Secondary | ICD-10-CM

## 2018-06-29 LAB — TYPE AND SCREEN
ABO/RH(D): O POS
Antibody Screen: NEGATIVE
Unit division: 0
Unit division: 0
Unit division: 0
Unit division: 0

## 2018-06-29 LAB — PREPARE PLATELET PHERESIS: Unit division: 0

## 2018-06-29 LAB — BPAM RBC
Blood Product Expiration Date: 202004032359
Blood Product Expiration Date: 202004032359
Blood Product Expiration Date: 202004032359
Blood Product Expiration Date: 202004032359
Unit Type and Rh: 5100
Unit Type and Rh: 5100
Unit Type and Rh: 5100
Unit Type and Rh: 5100

## 2018-06-29 LAB — BPAM PLATELET PHERESIS
Blood Product Expiration Date: 202003082359
Unit Type and Rh: 5100

## 2018-06-30 ENCOUNTER — Inpatient Hospital Stay: Payer: Medicare HMO | Admitting: Oncology

## 2018-06-30 ENCOUNTER — Inpatient Hospital Stay: Payer: Medicare HMO

## 2018-06-30 ENCOUNTER — Encounter: Payer: Medicare HMO | Admitting: Nutrition

## 2018-07-23 NOTE — Discharge Summary (Signed)
Physician Discharge Summary  Joel [name] OVZ:[id number] DOB: 1940-11-24 DOA: 07/08/2018  PCP: [name], MD  Admit date: 07/01/2018 Discharge date: 07-17-18  Time spent: 20 minutes  Recommendations for Outpatient Follow-up:  1. 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 male with medical history significant of mantle cell lymphoma, prostate cancer (s/p of TURP), GIB, AVM, CKD 3, brain aneurysm, GERD, bilateral hydronephrosis, who presents with altered mental status, weakness, and diarrhea  Per patient's wife and the daughter, patient has been having generalized weakness for about 1 week. He has mild shortness of breath sometimes.  Patient becomes confused since yesterday.  He moves all extremities.  No facial droop or slurred speech. Per his wife, not sure if pt has pain any where. Pt has 3 loose and dark stool diarrhea since yesterday. No active nausea vomiting, respiratory distress, cough noted.  No fever or chills per his wife.  Of note, pt was recently hospitalized from 2/26-2/28 due to severe anemia 2/2 to GIB. His Hgb dropped to 2.6 and received 3 units of packed RBCs and 1 unit of platelets. Pt had EGD on 06/19/2018 which showed no evidence of active bleeding ulcers or bleeding AVMs, but inflammation and petechiae were seen in the duodenal bulb. Pt had NM GI loss study on 06/02/18 which was negative.  ED Course: pt was found to have pancytopenia (WBC 3.2, hemoglobin 4.8, platelet 8),  lactic acid 4.6, INR 1.2, PTT 35, abnormal liver function (ALP 317, AST 62, ALT 52, total bilirubin 2.6), stable renal function, tachycardia, tachypnea, oxygen saturation 98% on room air.  Patient is admitted to telemetry bed as inpatient.   Hospital Course:   Addendum: after pt is transferred to floor, he deteriorated quickly.  Blood pressure dropped, and became more and more confused.  I discussed with his wife and daughter about the possibility of starting comfort care. Before they make the decision, pt has unfortunately passed away.  Family are very supportive. Emotional support offered to family at bedside  Symptomatic anemia and pancytopenia: WBC 3.2, hemoglobin 4.8, platelet 8. Pt has hx of Mantle cell lymphoma and bone marrow failure of unknown etiology. His worsening anemia is likely due to the combination of bone marrow failure and GI bleeding secondary to severe thrombocytopenia.  Patient had similar admission recently . He had EGD on 06/19/2018 which showed no evidence of active bleeding ulcers or bleeding AVMs and inflammation and petechiae were seen in the duodenal bulb.  I have had extensive discussion with his wife and daughter about goals of care.  At this moment, they want patient to be transfused and hold off any further image work-up and GI work-up.  -will admit to tele bed as inpt - 2 large IV - protonix 40 mg bid by IV - will transfuse patient with 4 unit of blood and 1 unit of pheresed platelets -check CBC in AM. -will give 40 mg of lasix after finishing 1st units of blood transfusion. -f/u palliative  consult about goal of care  Diarrhea: pt has bloody diarrhea, likely due to GI bleeding secondary to thrombocytopenia. -will hold off further work up and do blood transfusion now -f/u palliative care for goal of care.  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 Pt has very poor prognosis per oncologist -Will need ot arrange for hospice  follow-up on discharge.  Protein-calorie malnutrition, severe ([place]): -start nutrition supplement when able to eat  CKD (chronic kidney disease), stage III ([place]): stable -f/u by CBC  GERD (gastroesophageal reflux disease): -on iV protonix  Abnormal LFTs: Etiology is not clear.   -Will hold off a further work-up at this moment.  And follow-up palliative consult  for goals of care  Acute metabolic encephalopathy: Etiology is not clear.  Likely due to multifactorial etiology.  Per family, will hold off brain image at this moment. -Frequent neuro check -Treat underlying issues as above.  Goal of care: I discussed with his wife and the daughter about goals of care extensively.  Per them, will hold off any image test, and just get blood transfusion now. Will get palliative care. Pt will need hospice care.   Procedures: None  Consultations:  none  Discharge Exam: Vitals:   07/04/2018 2230 07/04/2018 2300  BP: (!) 134/99 124/70  Pulse: 100 (!) 108  Resp: (!) 24 (!) 30  SpO2: 100% 100%    General: death   Discharge Instructions none    No Known Allergies    The results of significant diagnostics from this hospitalization (including imaging, microbiology, ancillary and laboratory) are listed below for reference.    Significant Diagnostic Studies: Nm Gi Blood Loss  Result Date: 06/02/2018 CLINICAL DATA:  Anemia with bloody stools.  Undergoing transfusion. EXAM: NUCLEAR MEDICINE GASTROINTESTINAL BLEEDING SCAN TECHNIQUE: Sequential abdominal images were obtained following intravenous administration of Tc-50m labeled red blood cells. RADIOPHARMACEUTICALS:  23.0 mCi Tc-90m pertechnetate in-vitro labeled red cells. COMPARISON:  Abdominopelvic CT 02/18/2018. FINDINGS: Through 2 hours of imaging, no gastrointestinal bleeding is identified. There is a good red cell tag with normal blood pool activity in the liver, spleen, genitalia and vasculature. Progressive bladder activity  noted. IMPRESSION: No evidence of active gastrointestinal bleeding. Electronically Signed   By: Richardean Sale M.D.   On: 06/02/2018 17:10    Microbiology: Recent Results (from the past 240 hour(s))  MRSA PCR Screening     Status: None   Collection Time: 06/19/18  2:46 AM  Result Value Ref Range Status   MRSA by PCR NEGATIVE NEGATIVE Final    Comment:        The GeneXpert MRSA Assay (FDA approved for NASAL specimens only), is one component of a comprehensive MRSA colonization surveillance program. It is not intended to diagnose MRSA infection nor to guide or monitor treatment for MRSA infections. Performed at [facility], [place] [address], [place], [place] [address]      Labs: Basic Metabolic Panel: Recent Labs  Lab 07/22/2018 1957  NA 147*  K 3.7  CL 116*  CO2 20*  GLUCOSE 141*  BUN 52*  CREATININE 1.30*  CALCIUM 8.5*   Liver Function Tests: Recent Labs  Lab 07/09/2018 1957  AST 62*  ALT 52*  ALKPHOS 317*  BILITOT 2.6*  PROT 4.1*  ALBUMIN 1.8*   No results for input(s): LIPASE, AMYLASE in the last 168 hours. No results for input(s): AMMONIA in the last 168 hours. CBC: Recent Labs  Lab 06/23/18 [phone] 06/27/18 1957  WBC 1.4* 3.2*  NEUTROABS 0.5* 0.6*  HGB 10.7* 4.8*  HCT 31.7* 15.4*  MCV 85.0 89.5  PLT 11* 8*   Cardiac Enzymes: No results for input(s): CKTOTAL, CKMB, CKMBINDEX, TROPONINI in the last 168 hours. BNP: BNP (last 3 results) No results for input(s): BNP in the last 8760 hours.  ProBNP (last 3 results) No results for input(s): PROBNP in the last 8760 hours.  CBG: No results for input(s): GLUCAP in the last 168 hours.   Signed:  Ivor Costa  [facility] 07-03-18, 5:02 AM

## 2018-07-23 NOTE — Progress Notes (Signed)
   07/04/18 0021  Attending Physican Contact  Attending Physician Notified Y  Attending Physician (First and Last Name) X. Niu  Will the above attending physician sign death certificate? Other (Comment) Lovey Newcomer NP)  Post Mortem Checklist  Date of Death 07/04/2018  Time of Death 07-12-2022  Pronounced By Lauralyn Primes RN, Belma Ceric RN  Next of kin notified Yes  Name of next of kin notified of death [place] Person's Relationship to Patient Spouse  Contact Person's Phone Number [phone]  Contact Person's address [place] [address]  Was the patient a No Code Blue or a Limited Code Blue? Yes  Did the patient die unattended? No  Patient restrained? Not applicable  Height 5\' 7"  (1.702 m)  Weight 55.1 kg  [place] [facility]  Notification Date 2018/07/04  Notification Time [place] [facility] Number [phone]  Is patient a potential donor? N  Autopsy  Autopsy requested by N/A  Patient Belongings/Medications Returned  Patient belongings from bedside/safe/pharmacy returned  None  Medical Examiner  Is this a medical examiner's case? [facility] home name/address/phone # [place] [phone]  Planned location of pickup [place]

## 2018-07-23 NOTE — ED Provider Notes (Signed)
[facility] Provider Note   CSN: [id number] Arrival date & time: 06/24/2018  1857    History   Chief Complaint Chief Complaint  Patient presents with  . Weakness  . Diarrhea    HPI Jazen Spraggins is a 78 y.o. male.     HPI  Level 5 caveat patient is unresponsive.  78 year old male with history of mantle cell lymphoma and pancytopenia comes in with chief complaint of altered mental status.  Patient's family is at the bedside.  They reported that patient has been going to the cancer center every Monday to get transfused.  Last Monday when they saw him his hemoglobin was normal, but his platelet count was extremely low therefore he received platelet transfusion.  He was supposed to go back to the cancer clinic this Monday, however today he started getting significantly weak and confused so they brought him to the ER.  They inform me that patient has been having some bloody stools.  There has not been any infection.  Patient was recently admitted to the hospital for anemia.  He had an upper endoscopy at that time which was negative for any acute process.  Patient was requested to place a PICC line, however he declined.  He has tenuous IV access and worsening edema in extremities.    Past Medical History:  Diagnosis Date  . Anemia   . Aneurysm ([place])    in 1987, brain aneurysm  . Bilateral hydronephrosis   . History of radiation therapy 04/17/17- 05/07/17   Right neck treated to 32.5 Gy with 13 fx of 2.5 Gy  . mantle cell lymphoma dx'd 03/2017  . Mass of right side of neck   . Pneumonia    hx of   . Prostate CA ([place]) dx'd 2017  . Urinary retention     Patient Active Problem List   Diagnosis Date Noted  . CKD (chronic kidney disease), stage III ([place]) 07/10/2018  . GERD (gastroesophageal reflux disease) 07/03/2018  . Abnormal LFTs 07/08/2018  . Acute metabolic encephalopathy 67/34/1935  . Hypernatremia 06/19/2018  . GI bleed  06/18/2018  . Thrombocytopenia ([place]) 06/18/2018  . Acute GI bleeding 05/30/2018  . Gastritis and gastroduodenitis   . AVM (arteriovenous malformation) of stomach, acquired   . Syncope 03/20/2018  . Hypotension 03/20/2018  . Pancytopenia ([place]) 03/20/2018  . Symptomatic anemia 03/20/2018  . Stroke ([place]) 04/22/2017  . Prostate cancer ([place]) 04/12/2017  . Mantle cell lymphoma of lymph nodes of head, face, and neck ([place]) 04/09/2017  . Bladder outlet obstruction 08/01/2015  . Anemia 07/29/2015  . Elevated serum creatinine   . Elevated prostate specific antigen (PSA) 07/28/2015  . Acute renal failure superimposed on stage 3 chronic kidney disease ([place]) 07/28/2015  . Urinary retention 07/28/2015  . AKI (acute kidney injury) ([place]) 07/08/2015  . Normocytic anemia 07/08/2015  . Hyperkalemia 07/08/2015  . Elevated blood pressure 07/08/2015  . Renal failure 07/08/2015  . Diarrhea 07/08/2015  . Protein-calorie malnutrition, severe ([place]) 07/08/2015  . Bilateral leg edema 07/08/2015    Past Surgical History:  Procedure Laterality Date  . BIOPSY  05/21/2018   Procedure: BIOPSY;  Surgeon: Milus Banister, MD;  Location: [facility];  Service: Gastroenterology;;  . COLONOSCOPY WITH PROPOFOL N/A 06/03/2018   Procedure: COLONOSCOPY WITH PROPOFOL;  Surgeon: Ronnette Juniper, MD;  Location: [facility];  Service: Gastroenterology;  Laterality: N/A;  . ESOPHAGOGASTRODUODENOSCOPY (EGD) WITH PROPOFOL N/A 05/21/2018   Procedure: ESOPHAGOGASTRODUODENOSCOPY (EGD) WITH PROPOFOL;  Surgeon: Ardis Hughs,  Melene Plan, MD;  Location: [facility];  Service: Gastroenterology;  Laterality: N/A;  . ESOPHAGOGASTRODUODENOSCOPY (EGD) WITH PROPOFOL N/A 06/03/2018   Procedure: ESOPHAGOGASTRODUODENOSCOPY (EGD) WITH PROPOFOL;  Surgeon: Ronnette Juniper, MD;  Location: [facility];  Service: Gastroenterology;  Laterality: N/A;  . ESOPHAGOGASTRODUODENOSCOPY (EGD) WITH PROPOFOL N/A 06/19/2018   Procedure: ESOPHAGOGASTRODUODENOSCOPY (EGD) WITH  PROPOFOL;  Surgeon: Otis Brace, MD;  Location: [facility];  Service: Gastroenterology;  Laterality: N/A;  . HOT HEMOSTASIS N/A 05/21/2018   Procedure: HOT HEMOSTASIS (ARGON PLASMA COAGULATION/BICAP);  Surgeon: Milus Banister, MD;  Location: [facility];  Service: Gastroenterology;  Laterality: N/A;  . HOT HEMOSTASIS N/A 06/03/2018   Procedure: HOT HEMOSTASIS (ARGON PLASMA COAGULATION/BICAP);  Surgeon: Ronnette Juniper, MD;  Location: [facility];  Service: Gastroenterology;  Laterality: N/A;  . MASS BIOPSY Right 03/27/2017   Procedure: OPEN NECK MASS BIOPSY OF THE RIGHT NECK;  Surgeon: Leta Baptist, MD;  Location: [facility];  Service: ENT;  Laterality: Right;  . POLYPECTOMY  06/03/2018   Procedure: POLYPECTOMY;  Surgeon: Ronnette Juniper, MD;  Location: [facility];  Service: Gastroenterology;;  . PROSTATE BIOPSY N/A 07/30/2015   Procedure: PROSTATE BIOPSY;  Surgeon: Irine Seal, MD;  Location: [facility];  Service: Urology;  Laterality: N/A;  . TRANSURETHRAL RESECTION OF PROSTATE N/A 07/30/2015   Procedure: TRANSURETHRAL RESECTION OF THE PROSTATE (TURP);  Surgeon: Irine Seal, MD;  Location: [facility];  Service: Urology;  Laterality: N/A;        Home Medications    Prior to Admission medications   Medication Sig Start Date End Date Taking? Authorizing Provider  acetaminophen (TYLENOL) 325 MG tablet Take 325 mg by mouth every 6 (six) hours as needed for mild pain or headache.   Yes [provider]  ferrous sulfate (SLOW FE) 160 (50 Fe) MG TBCR SR tablet Take 1 tablet (160 mg total) by mouth daily. 06/20/18  Yes Georgette Shell, MD  OVER THE COUNTER MEDICATION Take 1-2 drops by mouth 2 (two) times daily. Papaya drops (to help blood platelets)   Yes [provider]  pantoprazole (PROTONIX) 40 MG tablet Take 1 tablet (40 mg total) by mouth daily. 06/20/18 06/20/19 Yes Georgette Shell, MD    Family History Family History  Problem Relation Age of Onset  . Dementia Mother   . Heart  disease Father   . Lung cancer Brother   . Lupus Sister     Social History Social History   Tobacco Use  . Smoking status: Former Smoker    Last attempt to quit: 04/28/1985    Years since quitting: 33.1  . Smokeless tobacco: Former Systems developer    Types: Chew  . Tobacco comment: quit 30-40 yrs. aog  Substance Use Topics  . Alcohol use: No    Alcohol/week: 0.0 standard drinks  . Drug use: No     Allergies   Patient has no known allergies.   Review of Systems Review of Systems  Unable to perform ROS: Mental status change     Physical Exam Updated Vital Signs BP 124/70   Pulse (!) 108   Resp (!) 30   Ht 5\' 7"  (1.702 m)   Wt 55.1 kg   SpO2 100%   BMI 19.03 kg/m   Physical Exam Vitals signs and nursing note reviewed.  Constitutional:      Appearance: He is well-developed. He is ill-appearing.     Comments: Confused  HENT:     Head: Atraumatic.  Neck:     Musculoskeletal: Neck supple.  Cardiovascular:     Rate and Rhythm: Tachycardia present.  Pulmonary:     Comments: Tachypneic Musculoskeletal:     Right lower leg: No edema.     Left lower leg: Edema present.  Skin:    General: Skin is warm.     Coloration: Skin is pale.      ED Treatments / Results  Labs (all labs ordered are listed, but only abnormal results are displayed) Labs Reviewed  COMPREHENSIVE METABOLIC PANEL - Abnormal; Notable for the following components:      Result Value   Sodium 147 (*)    Chloride 116 (*)    CO2 20 (*)    Glucose, Bld 141 (*)    BUN 52 (*)    Creatinine, Ser 1.30 (*)    Calcium 8.5 (*)    Total Protein 4.1 (*)    Albumin 1.8 (*)    AST 62 (*)    ALT 52 (*)    Alkaline Phosphatase 317 (*)    Total Bilirubin 2.6 (*)    GFR calc non Af Amer 53 (*)    All other components within normal limits  CBC WITH DIFFERENTIAL/PLATELET - Abnormal; Notable for the following components:   WBC 3.2 (*)    RBC 1.72 (*)    Hemoglobin 4.8 (*)    HCT 15.4 (*)    Platelets 8 (*)      nRBC 3.1 (*)    Neutro Abs 0.6 (*)    Abs Immature Granulocytes 0.30 (*)    All other components within normal limits  PROTIME-INR - Abnormal; Notable for the following components:   Prothrombin Time 15.3 (*)    All other components within normal limits  LACTIC ACID, PLASMA - Abnormal; Notable for the following components:   Lactic Acid, Venous 4.6 (*)    All other components within normal limits  RETICULOCYTES - Abnormal; Notable for the following components:   RBC. 1.72 (*)    Retic Count, Absolute 7.6 (*)    Immature Retic Fract 18.4 (*)    All other components within normal limits  APTT  TYPE AND SCREEN  PREPARE RBC (CROSSMATCH)  PREPARE PLATELET PHERESIS    EKG None  Radiology No results found.  Procedures .Critical Care Performed by: Varney Biles, MD Authorized by: Varney Biles, MD   Critical care provider statement:    Critical care time (minutes):  50   Critical care was necessary to treat or prevent imminent or life-threatening deterioration of the following conditions:  Circulatory failure   Critical care was time spent personally by me on the following activities:  Discussions with consultants, evaluation of patient's response to treatment, examination of patient, ordering and performing treatments and interventions, ordering and review of laboratory studies, ordering and review of radiographic studies, pulse oximetry, re-evaluation of patient's condition, obtaining history from patient or surrogate and review of old charts   (including critical care time)  Medications Ordered in ED Medications  0.9 %  sodium chloride infusion (0 mL/hr Intravenous Hold 07/09/2018 2119)  pantoprazole (PROTONIX) injection 40 mg (has no administration in time range)  ondansetron (ZOFRAN) tablet 4 mg (has no administration in time range)    Or  ondansetron (ZOFRAN) injection 4 mg (has no administration in time range)  furosemide (LASIX) injection 40 mg (0 mg Intravenous Hold  07/22/2018 2208)     Initial Impression / Assessment and Plan / ED Course  I have reviewed the triage vital signs and the nursing notes.  Pertinent  labs & imaging results that were available during my care of the patient were reviewed by me and considered in my medical decision making (see chart for details).        78 year old male with history of mantle cell lymphoma and complication of pancytopenia comes to the ER with chief complaint of altered mental status and weakness.  Patient is not able to provide any meaningful history.  He is noted to be cachectic appearing and pale.  Family reports that he has been having some off-and-on bloody stools.  He has required multiple transfusions of blood and platelets in the recent past.  Patient's hemoglobin came back around 5.  He is also significantly thrombocytopenic.  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  We will start transfusion of platelet and hemoglobin.  Although patient is now able to provide meaningful history, it appears that his sensory exam is normal in all extremities and he does respond to his name and the pupillary exam is normal.  I doubt that he had a brain bleed.   Patient has declined PICC line in the recent past.  Fortunately the nurse was able to get 1 ultrasound-guided IV which we will utilized for transfusion.   CODE STATUS is DNR.  Goals of care discussion needed and we have consulted palliative medicine.  Final Clinical Impressions(s) / ED Diagnoses   Final diagnoses:  Symptomatic anemia  Antineoplastic chemotherapy induced pancytopenia [facility])    ED Discharge Orders    None       Varney Biles, MD 07-15-2018 [phone]

## 2018-07-23 NOTE — Progress Notes (Signed)
Upon arrival to room 1403 patient was assessed to be having difficulty breathing. Respirations were shallow with rhonchi auscultated in upper lobes, patient had to be suctioned several times as well. Rapid response was called to assess the patient as well as the admitting provider. Family present at the bedside. Rapid response RN and Dr. Blaine Hamper arrived to the bedside to assess the patient. Comfort measures were discussed with the patient's wife and daughter. During this discussion it was determined to keep patient comfortable and not pursue aggressive treatment. Patient passed at [place]. Two RN's pronounced time of death. Dr. Blaine Hamper notified. Post mortem care to be completed.

## 2018-07-23 DEATH — deceased

## 2018-12-23 IMAGING — MR MR HEAD WO/W CM
11 series · 48 of 48 positions shown · IV contrast (multihance)
Comparison: 02/27/2017 CT head

CLINICAL DATA: 76 y/o  M; malignant lymphoma.  [facility] protocol.

EXAM:
MRI HEAD WITHOUT AND WITH CONTRAST
TECHNIQUE: Multiplanar, multiecho pulse sequences of the brain and surrounding
structures were obtained without and with intravenous contrast.
CONTRAST:  15mL MULTIHANCE GADOBENATE DIMEGLUMINE 529 MG/ML IV SOLN

[Series 3: FLAIR · sagittal · 3.0mm · 0.75mm/px · 3 of 39 slices shown (1 of 2)]
[im 1/39]
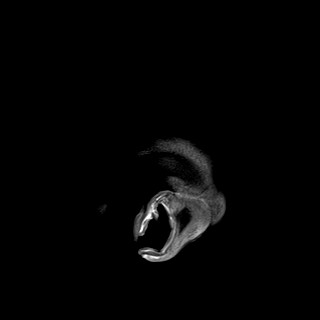
[im 20/39]
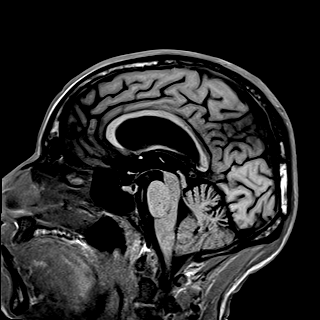
[im 39/39]
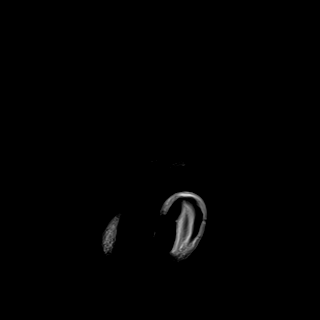

[Series 4: DWI · axial · 3.0mm · 1.50mm/px · z∈[-83,+85]mm · 5 of 88 slices shown (1 of 2)]
[im 1/88]
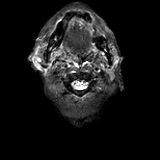
[im 22/88]
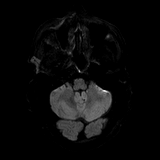
[im 44/88]
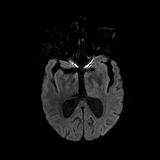
[im 66/88]
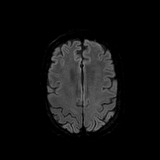
[im 88/88]
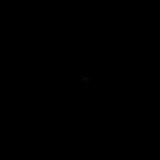

[Series 5: DWI · axial · 3.0mm · 1.50mm/px · z∈[-83,+85]mm · 3 of 41 slices shown (2 of 2)]
[im 1/41]
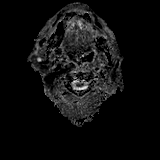
[im 21/41]
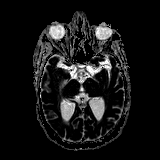
[im 41/41]
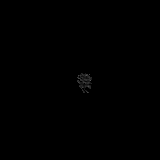

[Series 6: T2 · axial · 5.0mm · 0.57mm/px · z∈[-81,+93]mm · 2 of 30 slices shown]
[im 1/30]
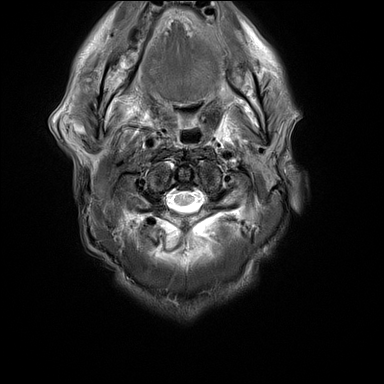
[im 30/30]
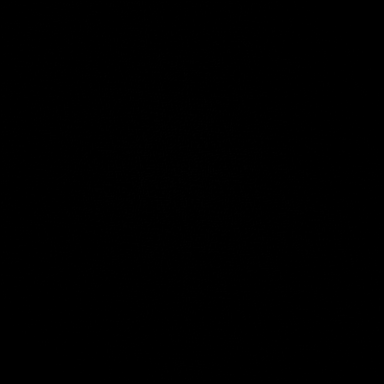

[Series 7: GRE · axial · 5.0mm · 0.57mm/px · z∈[-81,+93]mm · 2 of 30 slices shown]
[im 1/30]
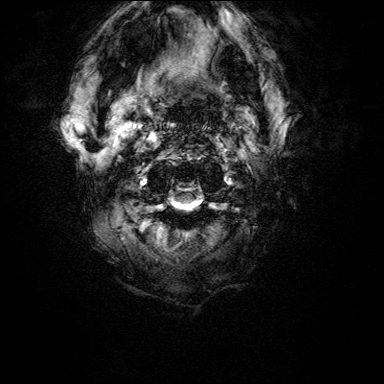
[im 30/30]
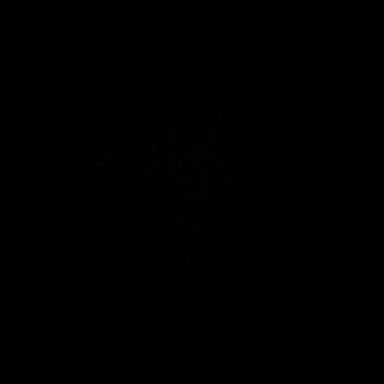

[Series 8: FLAIR · axial · 3.0mm · 0.57mm/px · z∈[-78,+87]mm · 3 of 56 slices shown (2 of 2)]
[im 1/56]
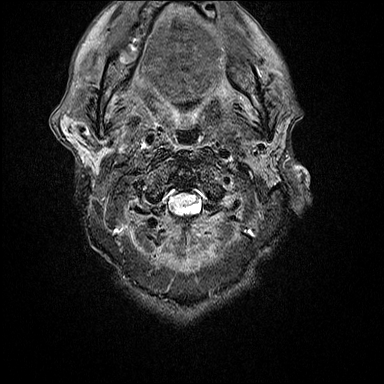
[im 28/56]
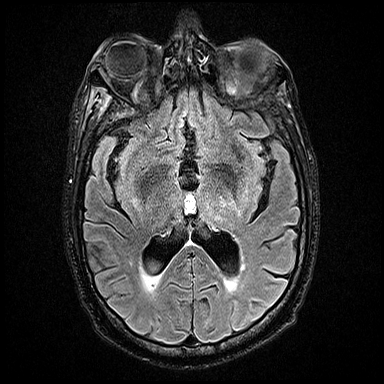
[im 56/56]
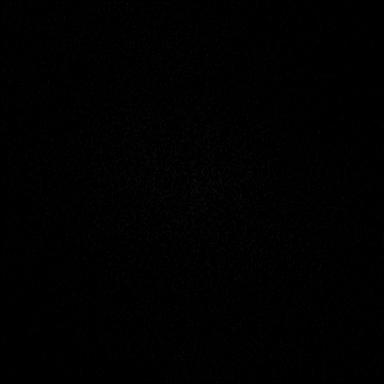

[Series 9: T1 · axial · 1.0mm · 0.75mm/px · z∈[-82,+93]mm · 11 of 175 slices shown]
[im 1/175]
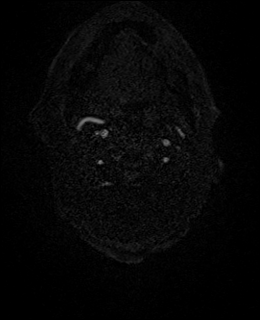
[im 18/175]
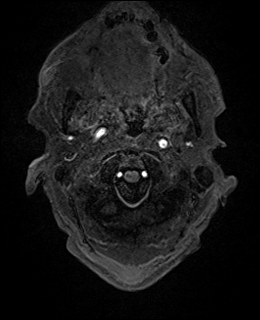
[im 35/175]
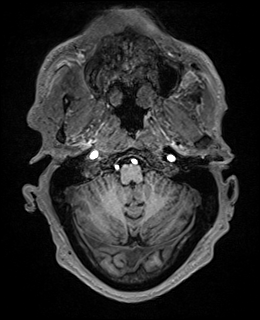
[im 53/175]
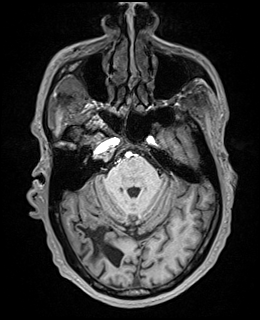
[im 70/175]
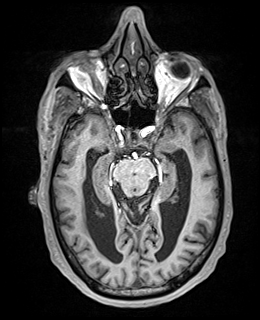
[im 88/175]
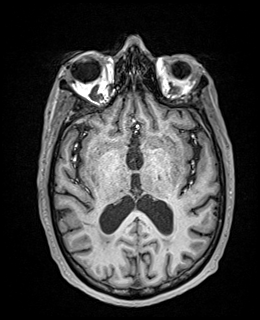
[im 105/175]
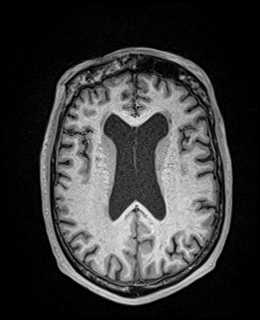
[im 122/175]
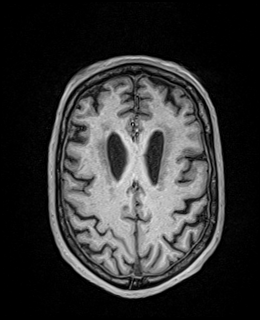
[im 140/175]
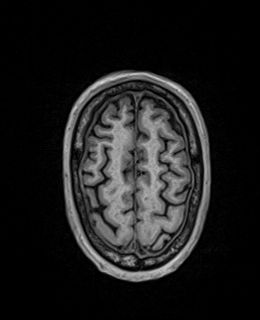
[im 157/175]
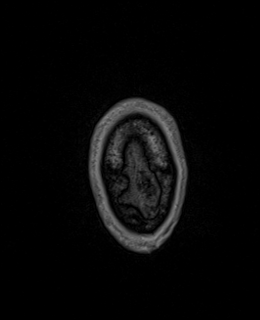
[im 175/175]
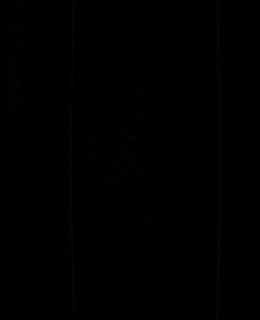

[Series 10: T2 post-contrast · coronal · 3.0mm · 0.57mm/px · 3 of 50 slices shown]
[im 1/50]
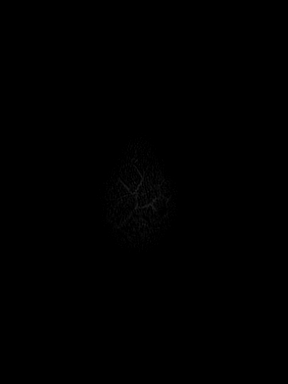
[im 25/50]
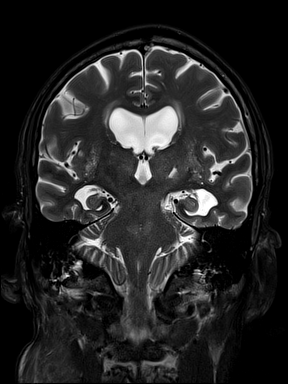
[im 50/50]
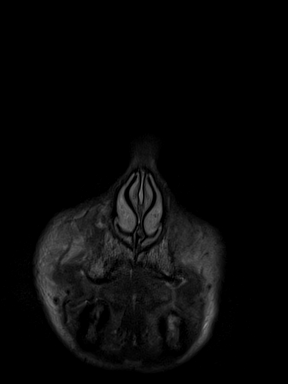

[Series 11: T1 post-contrast · axial · 1.0mm · 0.75mm/px · z∈[-82,+93]mm · 11 of 174 slices shown (1 of 2)]
[im 1/174]
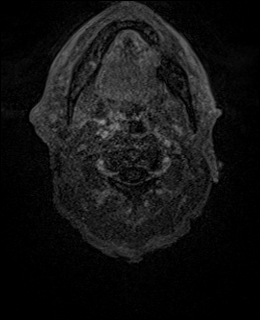
[im 18/174]
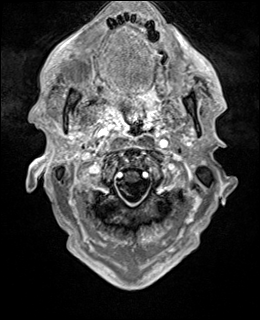
[im 35/174]
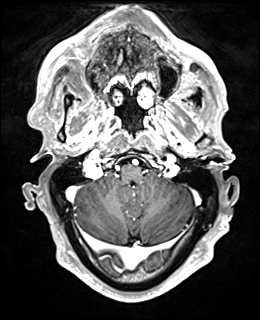
[im 52/174]
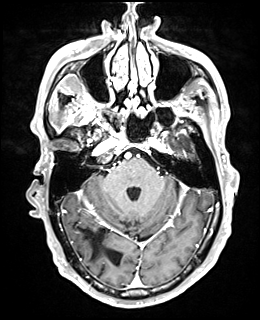
[im 70/174]
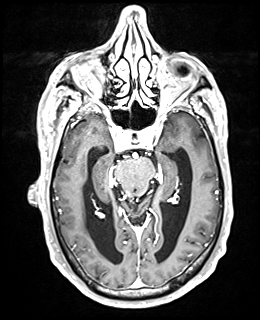
[im 87/174]
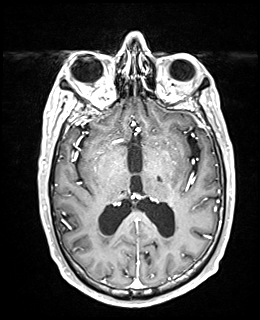
[im 104/174]
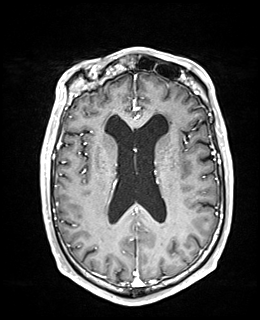
[im 122/174]
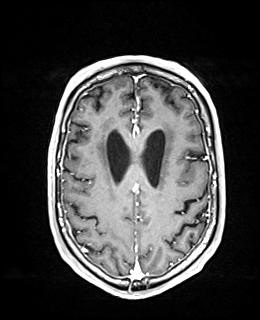
[im 139/174]
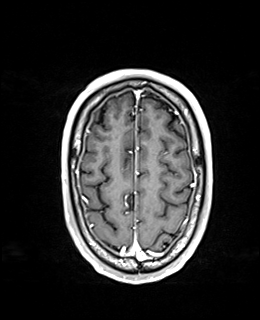
[im 156/174]
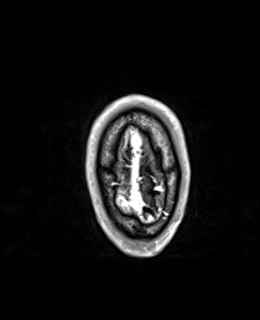
[im 174/174]
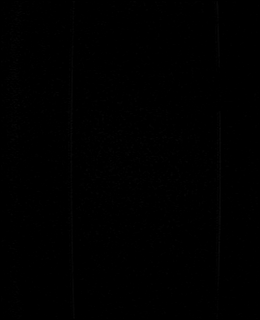

[Series 12: T1 post-contrast · coronal · 3.0mm · 0.57mm/px · 3 of 50 slices shown (2 of 2)]
[im 1/50]
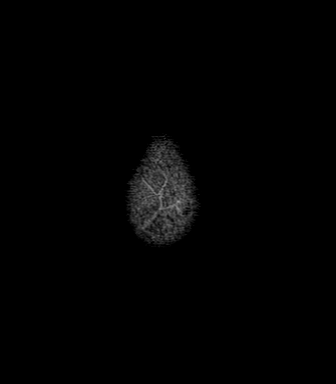
[im 25/50]
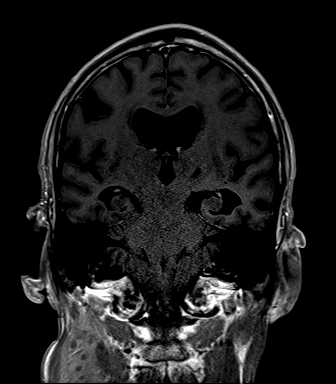
[im 50/50]
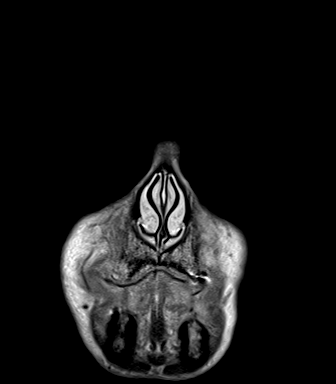

[Series 13: FLAIR post-contrast · sagittal · 3.0mm · 0.75mm/px · 2 of 39 slices shown]
[im 1/39]
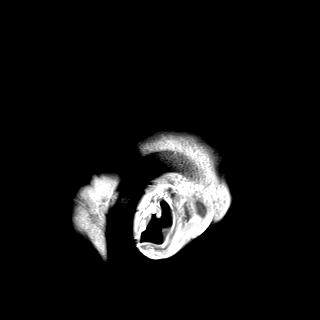
[im 39/39]
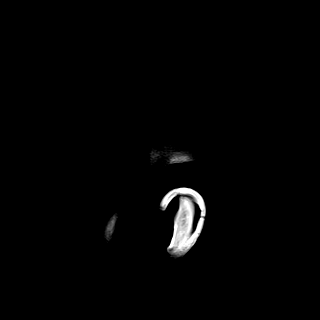

[48 of 48 positions shown; findings below may reference images not displayed]

FINDINGS: Brain: Subcentimeter left temporal occipital periventricular focus
of reduced diffusion, likely small vessel acute/early subacute
infarction. Right temporal occipital hemosiderin stained
encephalomalacia, likely chronic infarction. Small chronic lacunar
infarcts in bilateral thalamus. Few additional scattered punctate
foci of susceptibility hypointensity likely represents hemosiderin
deposition of chronic microhemorrhage. Moderate chronic
microvascular ischemic changes and parenchymal volume loss of the
brain. No abnormal enhancement of the brain.

Vascular: Normal flow voids.

Skull and upper cervical spine: Normal marrow signal.

Sinuses/Orbits: Mild diffuse paranasal sinus mucosal thickening.
Trace bilateral mastoid effusions. Orbits are unremarkable.

Other: None.
IMPRESSION: 1. No evidence of intracranial lymphoma.
2. Right occipitotemporal chronic infarction. Chronic thalamus
lacunar infarctions. Moderate chronic microvascular ischemic changes
and moderate parenchymal volume loss of the brain.

By: Czar Begay M.D.

## 2019-10-16 IMAGING — NM NM GI BLOOD LOSS
2 series · 12 of 12 positions shown · non-contrast
Comparison: Abdominopelvic CT 02/18/2018.

CLINICAL DATA: Anemia with bloody stools.  Undergoing transfusion.

EXAM:
NUCLEAR MEDICINE GASTROINTESTINAL BLEEDING SCAN
TECHNIQUE: Sequential abdominal images were obtained following intravenous
administration of 5c-DDm labeled red blood cells.
RADIOPHARMACEUTICALS:  23.0 mCi 5c-DDm pertechnetate in-vitro
labeled red cells.

[Series 1: gi bleed · 4.14mm/px · 6 of 60 frames shown (1 of 2)]
[frame 6/60]
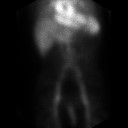
[frame 16/60]
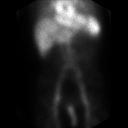
[frame 26/60]
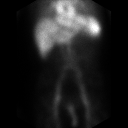
[frame 36/60]
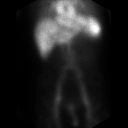
[frame 46/60]
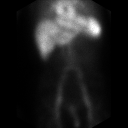
[frame 56/60]
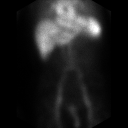

[Series 2: gi bleed · 4.14mm/px · 6 of 60 frames shown (2 of 2)]
[frame 6/60]
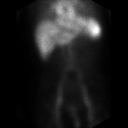
[frame 16/60]
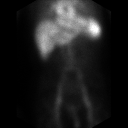
[frame 26/60]
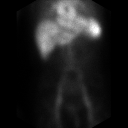
[frame 36/60]
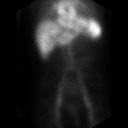
[frame 46/60]
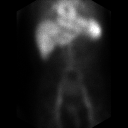
[frame 56/60]
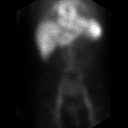

[12 of 12 positions shown; findings below may reference images not displayed]

FINDINGS: Through 2 hours of imaging, no gastrointestinal bleeding is
identified. There is a good red cell tag with normal blood pool
activity in the liver, spleen, genitalia and vasculature.
Progressive bladder activity noted.
IMPRESSION: No evidence of active gastrointestinal bleeding.
